# Patient Record
Sex: Female | Born: 1960 | Race: Black or African American | Hispanic: No | Smoking: Never smoker
Health system: Southern US, Community
[De-identification: ages and names within clinical notes are randomized; demographics above are authoritative.]

## PROBLEM LIST (undated history)

## (undated) DIAGNOSIS — I890 Lymphedema, not elsewhere classified: Secondary | ICD-10-CM

## (undated) DIAGNOSIS — I2510 Atherosclerotic heart disease of native coronary artery without angina pectoris: Secondary | ICD-10-CM

## (undated) DIAGNOSIS — I5189 Other ill-defined heart diseases: Secondary | ICD-10-CM

## (undated) DIAGNOSIS — J45909 Unspecified asthma, uncomplicated: Secondary | ICD-10-CM

## (undated) DIAGNOSIS — Z973 Presence of spectacles and contact lenses: Secondary | ICD-10-CM

## (undated) DIAGNOSIS — I319 Disease of pericardium, unspecified: Secondary | ICD-10-CM

## (undated) DIAGNOSIS — T4145XA Adverse effect of unspecified anesthetic, initial encounter: Secondary | ICD-10-CM

## (undated) DIAGNOSIS — R0600 Dyspnea, unspecified: Secondary | ICD-10-CM

## (undated) DIAGNOSIS — E119 Type 2 diabetes mellitus without complications: Secondary | ICD-10-CM

## (undated) DIAGNOSIS — I10 Essential (primary) hypertension: Secondary | ICD-10-CM

## (undated) DIAGNOSIS — T8859XA Other complications of anesthesia, initial encounter: Secondary | ICD-10-CM

## (undated) DIAGNOSIS — T7840XA Allergy, unspecified, initial encounter: Secondary | ICD-10-CM

## (undated) DIAGNOSIS — D759 Disease of blood and blood-forming organs, unspecified: Secondary | ICD-10-CM

## (undated) HISTORY — DX: Other ill-defined heart diseases: I51.89

## (undated) HISTORY — DX: Atherosclerotic heart disease of native coronary artery without angina pectoris: I25.10

## (undated) HISTORY — PX: PERICARDIOCENTESIS: SHX2215

## (undated) HISTORY — PX: ABDOMINAL HYSTERECTOMY: SHX81

---
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
## 2017-09-24 LAB — HM PAP SMEAR: HM Pap smear: NEGATIVE

## 2018-05-15 ENCOUNTER — Emergency Department: Payer: BLUE CROSS/BLUE SHIELD

## 2018-05-15 ENCOUNTER — Inpatient Hospital Stay
Admission: EM | Admit: 2018-05-15 | Discharge: 2018-05-20 | DRG: 247 | Disposition: A | Discharge status: Home / Self Care | Payer: BLUE CROSS/BLUE SHIELD | Attending: Specialist | Admitting: Specialist

## 2018-05-15 DIAGNOSIS — R51 Headache: Secondary | ICD-10-CM | POA: Diagnosis not present

## 2018-05-15 DIAGNOSIS — Z8249 Family history of ischemic heart disease and other diseases of the circulatory system: Secondary | ICD-10-CM

## 2018-05-15 DIAGNOSIS — Z6834 Body mass index (BMI) 34.0-34.9, adult: Secondary | ICD-10-CM

## 2018-05-15 DIAGNOSIS — R451 Restlessness and agitation: Secondary | ICD-10-CM | POA: Diagnosis not present

## 2018-05-15 DIAGNOSIS — Z881 Allergy status to other antibiotic agents status: Secondary | ICD-10-CM

## 2018-05-15 DIAGNOSIS — R29704 NIHSS score 4: Secondary | ICD-10-CM | POA: Diagnosis not present

## 2018-05-15 DIAGNOSIS — R4182 Altered mental status, unspecified: Secondary | ICD-10-CM | POA: Diagnosis not present

## 2018-05-15 DIAGNOSIS — I214 Non-ST elevation (NSTEMI) myocardial infarction: Secondary | ICD-10-CM | POA: Diagnosis not present

## 2018-05-15 DIAGNOSIS — E785 Hyperlipidemia, unspecified: Secondary | ICD-10-CM | POA: Diagnosis present

## 2018-05-15 DIAGNOSIS — Z22322 Carrier or suspected carrier of Methicillin resistant Staphylococcus aureus: Secondary | ICD-10-CM

## 2018-05-15 DIAGNOSIS — I10 Essential (primary) hypertension: Secondary | ICD-10-CM | POA: Diagnosis present

## 2018-05-15 DIAGNOSIS — I493 Ventricular premature depolarization: Secondary | ICD-10-CM | POA: Diagnosis not present

## 2018-05-15 DIAGNOSIS — I2510 Atherosclerotic heart disease of native coronary artery without angina pectoris: Secondary | ICD-10-CM | POA: Diagnosis present

## 2018-05-15 DIAGNOSIS — Z79899 Other long term (current) drug therapy: Secondary | ICD-10-CM

## 2018-05-15 DIAGNOSIS — R4781 Slurred speech: Secondary | ICD-10-CM | POA: Diagnosis not present

## 2018-05-15 DIAGNOSIS — I639 Cerebral infarction, unspecified: Secondary | ICD-10-CM

## 2018-05-15 DIAGNOSIS — I890 Lymphedema, not elsewhere classified: Secondary | ICD-10-CM | POA: Diagnosis present

## 2018-05-15 DIAGNOSIS — R112 Nausea with vomiting, unspecified: Secondary | ICD-10-CM | POA: Diagnosis not present

## 2018-05-15 DIAGNOSIS — E669 Obesity, unspecified: Secondary | ICD-10-CM | POA: Diagnosis present

## 2018-05-15 DIAGNOSIS — Z713 Dietary counseling and surveillance: Secondary | ICD-10-CM

## 2018-05-15 HISTORY — DX: Disease of pericardium, unspecified: I31.9

## 2018-05-15 HISTORY — DX: Lymphedema, not elsewhere classified: I89.0

## 2018-05-15 HISTORY — DX: Unspecified asthma, uncomplicated: J45.909

## 2018-05-15 HISTORY — DX: Essential (primary) hypertension: I10

## 2018-05-15 LAB — BASIC METABOLIC PANEL
ANION GAP: 8 (ref 5–15)
BUN: 12 mg/dL (ref 6–20)
CHLORIDE: 103 mmol/L (ref 98–111)
CO2: 27 mmol/L (ref 22–32)
CREATININE: 0.89 mg/dL (ref 0.44–1.00)
Calcium: 9.1 mg/dL (ref 8.9–10.3)
GFR calc non Af Amer: >60 mL/min (ref >60)
GLUCOSE: 87 mg/dL (ref 70–99)
POTASSIUM: 4 mmol/L (ref 3.5–5.1)
Sodium: 138 mmol/L (ref 135–145)

## 2018-05-15 LAB — CBC
HCT: 41.1 % (ref 35.0–47.0)
Hemoglobin: 13.2 g/dL (ref 12.0–16.0)
MCH: 26.6 pg (ref 26.0–34.0)
MCHC: 32.2 g/dL (ref 32.0–36.0)
MCV: 82.6 fL (ref 80.0–100.0)
Platelets: 238 10*3/uL (ref 150–440)
RBC: 4.98 MIL/uL (ref 3.80–5.20)
RDW: 15 % — ABNORMAL HIGH (ref 11.5–14.5)
WBC: 6.1 10*3/uL (ref 3.6–11.0)

## 2018-05-15 LAB — TROPONIN I: Troponin I: 0.04 ng/mL (ref <0.03)

## 2018-05-15 MED ORDER — NITROGLYCERIN 0.4 MG SL SUBL
0.4000 mg | SUBLINGUAL_TABLET | SUBLINGUAL | Status: DC | PRN
  Administered 2018-05-15: 0.4 mg via SUBLINGUAL

## 2018-05-15 MED ORDER — NITROGLYCERIN 0.4 MG SL SUBL
SUBLINGUAL_TABLET | SUBLINGUAL | Status: AC
  Administered 2018-05-15: 0.4 mg via SUBLINGUAL
  Filled 2018-05-15: qty 1

## 2018-05-15 MED ORDER — SODIUM CHLORIDE 0.9 % IV BOLUS
1000.0000 mL | Freq: Once | INTRAVENOUS | Status: AC
  Administered 2018-05-15: 1000 mL via INTRAVENOUS

## 2018-05-15 MED ORDER — HEPARIN (PORCINE) IN NACL 100-0.45 UNIT/ML-% IJ SOLN
850.0000 [IU]/h | INTRAMUSCULAR | Status: DC
  Administered 2018-05-16 (×2): 850 [IU]/h via INTRAVENOUS
  Filled 2018-05-15: qty 250

## 2018-05-15 MED ORDER — ASPIRIN 81 MG PO CHEW
CHEWABLE_TABLET | ORAL | Status: AC
Start: 2018-05-15 — End: 2018-05-16
  Filled 2018-05-15: qty 4

## 2018-05-15 MED ORDER — HEPARIN SODIUM (PORCINE) 5000 UNIT/ML IJ SOLN
4000.0000 [IU] | Freq: Once | INTRAMUSCULAR | Status: AC
  Administered 2018-05-15: 4000 [IU] via INTRAVENOUS

## 2018-05-15 NOTE — ED Notes (Signed)
Pt placed on zoll monitor

## 2018-05-15 NOTE — ED Notes (Signed)
Pt reports hx of pericarditis, states this CP feels similar. Pt reports CP on and off since Tuesday. Pt states improvement in CP when sitting up straight in bed. Pt states some SHOB with the pain as well.

## 2018-05-15 NOTE — ED Provider Notes (Signed)
[facility] Emergency Department Provider Note  ____________________________________________  Time seen: Approximately 10:39 PM  I have reviewed the triage vital signs and the nursing notes.   HISTORY  Chief Complaint Chest Pain   HPI Kelsey Terry is a 56 y.o. female with a history of hypertension, left lower extremity lymphedema, asthma, and pericarditis who presents for evaluation of chest pain.  Patient reports several episodes of chest pain since yesterday.  No chest pain prior to yesterday.  These episodes happen with minimal exertion and resolve at rest.  She reports the pain as severe pressure in the center of her chest associated with mild shortness of breath.  No dizziness, no nausea, no diaphoresis.  No prior history of smoking.  No personal or family history of heart attacks.  When EMS arrived patient's pain was 7 out of 10.  She received a full dose of aspirin.  At this time she reports that her pain is improved and currently 2 out of 10.  No personal or family history of blood clots, recent travel immobilization, leg pain or swelling, hemoptysis, exogenous hormones, or history of cancer.   Past Medical History:  Diagnosis Date  . Asthma   . Hypertension   . Lymphedema    LLE  . Pericarditis     There are no active problems to display for this patient.   Past Surgical History:  Procedure Laterality Date  . CESAREAN SECTION    . PERICARDIOCENTESIS      Prior to Admission medications   Medication Sig Start Date End Date Taking? Authorizing Provider  losartan-hydrochlorothiazide (HYZAAR) 100-25 MG tablet Take 1 tablet by mouth daily.   Yes [provider]  potassium chloride (K-DUR) 10 MEQ tablet Take 20 mEq by mouth daily.   Yes [provider]    Allergies Amoxicillin  No family history on file.  Social History Social History   Tobacco Use  . Smoking status: Never Smoker  . Smokeless tobacco: Never Used    Substance Use Topics  . Alcohol use: Not Currently  . Drug use: Not on file    Review of Systems  Constitutional: Negative for fever. Eyes: Negative for visual changes. ENT: Negative for sore throat. Neck: No neck pain  Cardiovascular: + chest pain. Respiratory: + shortness of breath. Gastrointestinal: Negative for abdominal pain, vomiting or diarrhea. Genitourinary: Negative for dysuria. Musculoskeletal: Negative for back pain. Skin: Negative for rash. Neurological: Negative for headaches, weakness or numbness. Psych: No SI or HI  ____________________________________________   PHYSICAL EXAM:  VITAL SIGNS: ED Triage Vitals  Enc Vitals Group     BP 05/15/18 2209 (!) 169/96     Pulse Rate 05/15/18 2209 89     Resp 05/15/18 2209 17     Temp 05/15/18 2209 98.1 F (36.7 C)     Temp Source 05/15/18 2209 Oral     SpO2 05/15/18 2209 99 %     Weight --      Height --      Head Circumference --      Peak Flow --      Pain Score 05/15/18 2224 1     Pain Loc --      Pain Edu? --      Excl. in [place]? --     Constitutional: Alert and oriented. Well appearing and in no apparent distress. HEENT:      Head: Normocephalic and atraumatic.         Eyes: Conjunctivae are normal.  Sclera is non-icteric.       Mouth/Throat: Mucous membranes are moist.       Neck: Supple with no signs of meningismus. Cardiovascular: Regular rate and rhythm. No murmurs, gallops, or rubs. 2+ symmetrical distal pulses are present in all extremities. No JVD. Respiratory: Normal respiratory effort. Lungs are clear to auscultation bilaterally. No wheezes, crackles, or rhonchi.  Gastrointestinal: Soft, non tender, and non distended with positive bowel sounds. No rebound or guarding. Musculoskeletal: Nontender with normal range of motion in all extremities. No edema, cyanosis, or erythema of extremities. Neurologic: Normal speech and language. Face is symmetric. Moving all extremities. No gross focal neurologic  deficits are appreciated. Skin: Skin is warm, dry and intact. No rash noted. Psychiatric: Mood and affect are normal. Speech and behavior are normal.  ____________________________________________   LABS (all labs ordered are listed, but only abnormal results are displayed)  Labs Reviewed  CBC - Abnormal; Notable for the following components:      Result Value   RDW 15.0 (*)    All other components within normal limits  TROPONIN I - Abnormal; Notable for the following components:   Troponin I 0.04 (*)    All other components within normal limits  BASIC METABOLIC PANEL  PROTIME-INR  APTT  LIPID PANEL  TROPONIN I   ____________________________________________  EKG  ED ECG REPORT I, Rudene Re, the attending physician, personally viewed and interpreted this ECG.  Normal sinus rhythm, rate of 72, normal intervals, normal axis, Wellens T wave in V2 with T wave inversion in 1, aVL, and V3.  No prior for comparison  22:21 -normal sinus rhythm, rate of 83, normal intervals, normal axis, ST depressions in inferior and lateral leads with no ST elevation.  Wellens T waves is no longer seen. ____________________________________________  RADIOLOGY  I have personally reviewed the images performed during this visit and I agree with the Radiologist's read.   Interpretation by Radiologist:  Dg Chest 2 View  Result Date: 05/15/2018 CLINICAL DATA:  Chest pain EXAM: CHEST - 2 VIEW COMPARISON:  None. FINDINGS: Diffuse interstitial coarsening. No focal airspace consolidation. No pleural effusion or pneumothorax. Normal cardiomediastinal contours. IMPRESSION: Possible chronic bronchitis.  No pneumonia or pulmonary edema. Electronically Signed   By: Ulyses Jarred M.D.   On: 05/15/2018 22:21    ____________________________________________   PROCEDURES  Procedure(s) performed: None Procedures Critical Care performed: yes  CRITICAL CARE Performed by: Rudene Re  ?  Total  critical care time: 35 min  Critical care time was exclusive of separately billable procedures and treating other patients.  Critical care was necessary to treat or prevent imminent or life-threatening deterioration.  Critical care was time spent personally by me on the following activities: development of treatment plan with patient and/or surrogate as well as nursing, discussions with consultants, evaluation of patient's response to treatment, examination of patient, obtaining history from patient or surrogate, ordering and performing treatments and interventions, ordering and review of laboratory studies, ordering and review of radiographic studies, pulse oximetry and re-evaluation of patient's condition.   ____________________________________________   INITIAL IMPRESSION / ASSESSMENT AND PLAN / ED COURSE  57 y.o. female with a history of hypertension, left lower extremity lymphedema, asthma, and pericarditis who presents for evaluation of chest pain.  Presentation concerning for ACS.  Initial EKG showing Wellens T wave in V2.  Patient's pain then improved and a repeat EKG was done which showed diffuse ST depressions in inferior and lateral leads but no ST elevation.  After  1 sublingual nitro pain went away. First troponin 0.04. I spoke with Dr. Fletcher Anon who agrees that EKG is concerning for ischemia but at this time he does not believe EKG meet STEMI criteria.  He recommended admission on heparin drip and catheterization in the morning.  He also recommended emergent cath overnight if patient's pain is uncontrolled or if she has any acute EKG changes with her pain.  That recommendation was discussed with Dr. Marcille Blanco who will be admitting patient for further evaluation.      As part of my medical decision making, I reviewed the following data within the [place] notes reviewed and incorporated, Labs reviewed , EKG interpreted , Radiograph reviewed , Discussed with  admitting physician , A consult was requested and obtained from this/these consultant(s) Cardiology, Notes from prior ED visits and [place] Controlled Substance Database    Pertinent labs & imaging results that were available during my care of the patient were reviewed by me and considered in my medical decision making (see chart for details).    ____________________________________________   FINAL CLINICAL IMPRESSION(S) / ED DIAGNOSES  Final diagnoses:  NSTEMI (non-ST elevated myocardial infarction) ([place])      NEW MEDICATIONS STARTED DURING THIS VISIT:  ED Discharge Orders    None       Note:  This document was prepared using Dragon voice recognition software and may include unintentional dictation errors.    Alfred Levins, [place], MD 05/15/18 [phone]

## 2018-05-15 NOTE — ED Triage Notes (Signed)
Pt arrived via EMS from home with c/o intermittent central chest pain that started Tuesday and tonight the pressure intensified. Aspirin given in route by EMS. Pt denies N/V/SOB.

## 2018-05-15 NOTE — ED Notes (Signed)
MD Edson Snowball RN aware of pt Troponin of 0.04

## 2018-05-15 NOTE — Progress Notes (Signed)
ANTICOAGULATION CONSULT NOTE - Initial Consult  Pharmacy Consult for Heparin  Indication: chest pain/ACS  Allergies  Allergen Reactions  . Amoxicillin Itching    Patient Measurements: Height: 5\' 3"  (160 cm) Weight: 180 lb (81.6 kg) IBW/kg (Calculated) : 52.4 Heparin Dosing Weight: 70.3 kg   Vital Signs: Temp: 98.1 F (36.7 C) (06/27 2209) Temp Source: Oral (06/27 2209) BP: 156/80 (06/27 2230) Pulse Rate: 76 (06/27 2241)  Labs: Recent Labs    05/15/18 2202  HGB 13.2  HCT 41.1  PLT 238  CREATININE 0.89  TROPONINI 0.04*    Estimated Creatinine Clearance: 69.7 mL/min (by C-G formula based on SCr of 0.89 mg/dL).   Medical History: Past Medical History:  Diagnosis Date  . Asthma   . Hypertension   . Lymphedema    LLE  . Pericarditis     Medications:   (Not in a hospital admission)  Assessment: Pharmacy consulted to dose heparin in this 57 year old female with ACS/NSTEMI .   No prior anticoag noted CrCl = 69.7 ml/min  Goal of Therapy:  Heparin level 0.3-0.7 units/ml Monitor platelets by anticoagulation protocol: Yes   Plan:  Give 4000 units bolus x 1 Start heparin infusion at 850 units/hr Check anti-Xa level in 6 hours and daily while on heparin Continue to monitor H&H and platelets  Carmyn Hamm D 05/15/2018,10:52 PM

## 2018-05-16 ENCOUNTER — Encounter: Payer: Self-pay | Admitting: *Deleted

## 2018-05-16 ENCOUNTER — Inpatient Hospital Stay (HOSPITAL_COMMUNITY)
Admit: 2018-05-16 | Discharge: 2018-05-16 | Disposition: A | Discharge status: Home / Self Care | Payer: BLUE CROSS/BLUE SHIELD | Attending: Physician Assistant | Admitting: Physician Assistant

## 2018-05-16 ENCOUNTER — Encounter
Admission: EM | Disposition: A | Discharge status: Home / Self Care | Payer: Self-pay | Source: Home / Self Care | Attending: Specialist

## 2018-05-16 ENCOUNTER — Inpatient Hospital Stay: Payer: BLUE CROSS/BLUE SHIELD

## 2018-05-16 ENCOUNTER — Other Ambulatory Visit: Payer: Self-pay

## 2018-05-16 DIAGNOSIS — R079 Chest pain, unspecified: Secondary | ICD-10-CM

## 2018-05-16 DIAGNOSIS — R29704 NIHSS score 4: Secondary | ICD-10-CM | POA: Diagnosis not present

## 2018-05-16 DIAGNOSIS — I10 Essential (primary) hypertension: Secondary | ICD-10-CM | POA: Diagnosis present

## 2018-05-16 DIAGNOSIS — R112 Nausea with vomiting, unspecified: Secondary | ICD-10-CM | POA: Diagnosis not present

## 2018-05-16 DIAGNOSIS — R4182 Altered mental status, unspecified: Secondary | ICD-10-CM | POA: Diagnosis not present

## 2018-05-16 DIAGNOSIS — I2510 Atherosclerotic heart disease of native coronary artery without angina pectoris: Secondary | ICD-10-CM | POA: Diagnosis present

## 2018-05-16 DIAGNOSIS — E669 Obesity, unspecified: Secondary | ICD-10-CM | POA: Diagnosis present

## 2018-05-16 DIAGNOSIS — I214 Non-ST elevation (NSTEMI) myocardial infarction: Secondary | ICD-10-CM

## 2018-05-16 DIAGNOSIS — Z881 Allergy status to other antibiotic agents status: Secondary | ICD-10-CM | POA: Diagnosis not present

## 2018-05-16 DIAGNOSIS — I639 Cerebral infarction, unspecified: Secondary | ICD-10-CM

## 2018-05-16 DIAGNOSIS — Z22322 Carrier or suspected carrier of Methicillin resistant Staphylococcus aureus: Secondary | ICD-10-CM | POA: Diagnosis not present

## 2018-05-16 DIAGNOSIS — I890 Lymphedema, not elsewhere classified: Secondary | ICD-10-CM | POA: Diagnosis present

## 2018-05-16 DIAGNOSIS — R51 Headache: Secondary | ICD-10-CM | POA: Diagnosis not present

## 2018-05-16 DIAGNOSIS — R451 Restlessness and agitation: Secondary | ICD-10-CM | POA: Diagnosis not present

## 2018-05-16 DIAGNOSIS — R4781 Slurred speech: Secondary | ICD-10-CM | POA: Diagnosis not present

## 2018-05-16 DIAGNOSIS — Z79899 Other long term (current) drug therapy: Secondary | ICD-10-CM | POA: Diagnosis not present

## 2018-05-16 DIAGNOSIS — E785 Hyperlipidemia, unspecified: Secondary | ICD-10-CM | POA: Diagnosis present

## 2018-05-16 DIAGNOSIS — Z8249 Family history of ischemic heart disease and other diseases of the circulatory system: Secondary | ICD-10-CM | POA: Diagnosis not present

## 2018-05-16 DIAGNOSIS — Z713 Dietary counseling and surveillance: Secondary | ICD-10-CM | POA: Diagnosis not present

## 2018-05-16 DIAGNOSIS — I493 Ventricular premature depolarization: Secondary | ICD-10-CM | POA: Diagnosis not present

## 2018-05-16 HISTORY — DX: Non-ST elevation (NSTEMI) myocardial infarction: I21.4

## 2018-05-16 HISTORY — PX: CORONARY STENT INTERVENTION: CATH118234

## 2018-05-16 HISTORY — PX: LEFT HEART CATH AND CORONARY ANGIOGRAPHY: CATH118249

## 2018-05-16 LAB — CBC
HEMATOCRIT: 42.6 % (ref 35.0–47.0)
Hemoglobin: 14 g/dL (ref 12.0–16.0)
MCH: 27 pg (ref 26.0–34.0)
MCHC: 33 g/dL (ref 32.0–36.0)
MCV: 82 fL (ref 80.0–100.0)
Platelets: 248 10*3/uL (ref 150–440)
RBC: 5.19 MIL/uL (ref 3.80–5.20)
RDW: 14.7 % — ABNORMAL HIGH (ref 11.5–14.5)
WBC: 6.4 10*3/uL (ref 3.6–11.0)

## 2018-05-16 LAB — HEMOGLOBIN A1C
Hgb A1c MFr Bld: 5.7 % — ABNORMAL HIGH (ref 4.8–5.6)
Mean Plasma Glucose: 116.89 mg/dL

## 2018-05-16 LAB — PROTIME-INR
INR: 0.94
Prothrombin Time: 12.5 seconds (ref 11.4–15.2)

## 2018-05-16 LAB — LIPID PANEL
CHOL/HDL RATIO: 4.2 ratio
Cholesterol: 232 mg/dL — ABNORMAL HIGH (ref 0–200)
HDL: 55 mg/dL (ref >40)
LDL Cholesterol: 168 mg/dL — ABNORMAL HIGH (ref 0–99)
Triglycerides: 43 mg/dL (ref <150)
VLDL: 9 mg/dL (ref 0–40)

## 2018-05-16 LAB — POCT ACTIVATED CLOTTING TIME: Activated Clotting Time: 279 seconds

## 2018-05-16 LAB — TROPONIN I
TROPONIN I: 0.08 ng/mL — AB (ref <0.03)
Troponin I: 0.13 ng/mL (ref <0.03)
Troponin I: 0.18 ng/mL (ref <0.03)
Troponin I: 0.18 ng/mL (ref <0.03)
Troponin I: 0.3 ng/mL (ref <0.03)

## 2018-05-16 LAB — ECHOCARDIOGRAM COMPLETE
HEIGHTINCHES: 63 in
Weight: 3187.2 oz

## 2018-05-16 LAB — APTT: aPTT: 76 seconds — ABNORMAL HIGH (ref 24–36)

## 2018-05-16 LAB — GLUCOSE, CAPILLARY
Glucose-Capillary: 79 mg/dL (ref 70–99)
Glucose-Capillary: 112 mg/dL — ABNORMAL HIGH (ref 70–99)

## 2018-05-16 LAB — MRSA PCR SCREENING: MRSA by PCR: POSITIVE — AB

## 2018-05-16 LAB — TSH: TSH: 1.115 u[IU]/mL (ref 0.350–4.500)

## 2018-05-16 LAB — HEPARIN LEVEL (UNFRACTIONATED): Heparin Unfractionated: 0.4 IU/mL (ref 0.30–0.70)

## 2018-05-16 LAB — MAGNESIUM: MAGNESIUM: 2.6 mg/dL — AB (ref 1.7–2.4)

## 2018-05-16 SURGERY — LEFT HEART CATH AND CORONARY ANGIOGRAPHY
Anesthesia: Moderate Sedation

## 2018-05-16 MED ORDER — IOHEXOL 350 MG/ML SOLN
75.0000 mL | Freq: Once | INTRAVENOUS | Status: AC | PRN
  Administered 2018-05-16: 75 mL via INTRAVENOUS

## 2018-05-16 MED ORDER — ONDANSETRON HCL 4 MG/2ML IJ SOLN
INTRAMUSCULAR | Status: AC
  Filled 2018-05-16: qty 2

## 2018-05-16 MED ORDER — LOSARTAN POTASSIUM 50 MG PO TABS
100.0000 mg | ORAL_TABLET | Freq: Every day | ORAL | Status: DC
  Administered 2018-05-17 – 2018-05-20 (×4): 100 mg via ORAL
  Filled 2018-05-16 (×4): qty 2

## 2018-05-16 MED ORDER — SODIUM CHLORIDE 0.9% FLUSH
3.0000 mL | Freq: Two times a day (BID) | INTRAVENOUS | Status: DC
  Administered 2018-05-16 – 2018-05-19 (×6): 3 mL via INTRAVENOUS

## 2018-05-16 MED ORDER — CARVEDILOL 6.25 MG PO TABS
6.2500 mg | ORAL_TABLET | Freq: Two times a day (BID) | ORAL | Status: DC
  Administered 2018-05-17 – 2018-05-19 (×3): 6.25 mg via ORAL
  Filled 2018-05-16 (×6): qty 1

## 2018-05-16 MED ORDER — HEPARIN SODIUM (PORCINE) 1000 UNIT/ML IJ SOLN
INTRAMUSCULAR | Status: DC | PRN
  Administered 2018-05-16 (×2): 4500 [IU] via INTRAVENOUS

## 2018-05-16 MED ORDER — NITROGLYCERIN 1 MG/10 ML FOR IR/CATH LAB
INTRA_ARTERIAL | Status: DC | PRN
  Administered 2018-05-16: 300 ug via INTRACORONARY
  Administered 2018-05-16: 200 ug via INTRACORONARY

## 2018-05-16 MED ORDER — SODIUM CHLORIDE 0.9 % WEIGHT BASED INFUSION: 1.0000 mL/kg/h | INTRAVENOUS | Status: DC

## 2018-05-16 MED ORDER — MIDAZOLAM HCL 2 MG/2ML IJ SOLN
INTRAMUSCULAR | Status: DC | PRN
  Administered 2018-05-16 (×2): 1 mg via INTRAVENOUS

## 2018-05-16 MED ORDER — SODIUM CHLORIDE 0.9% FLUSH: 3.0000 mL | INTRAVENOUS | Status: DC | PRN

## 2018-05-16 MED ORDER — POTASSIUM CHLORIDE CRYS ER 20 MEQ PO TBCR
20.0000 meq | EXTENDED_RELEASE_TABLET | Freq: Every day | ORAL | Status: DC
  Administered 2018-05-17 – 2018-05-20 (×4): 20 meq via ORAL
  Filled 2018-05-16 (×4): qty 1

## 2018-05-16 MED ORDER — DEXMEDETOMIDINE HCL IN NACL 400 MCG/100ML IV SOLN
INTRAVENOUS | Status: AC
  Administered 2018-05-16: 0.5 ug/kg/h via INTRAVENOUS
  Filled 2018-05-16: qty 100

## 2018-05-16 MED ORDER — LIDOCAINE HCL (PF) 1 % IJ SOLN
INTRAMUSCULAR | Status: AC
  Filled 2018-05-16: qty 30

## 2018-05-16 MED ORDER — HYDRALAZINE HCL 20 MG/ML IJ SOLN: 5.0000 mg | INTRAMUSCULAR | Status: DC | PRN

## 2018-05-16 MED ORDER — DOCUSATE SODIUM 100 MG PO CAPS
100.0000 mg | ORAL_CAPSULE | Freq: Two times a day (BID) | ORAL | Status: DC
  Administered 2018-05-17 – 2018-05-19 (×3): 100 mg via ORAL
  Filled 2018-05-16 (×6): qty 1

## 2018-05-16 MED ORDER — PROMETHAZINE HCL 25 MG/ML IJ SOLN
12.5000 mg | Freq: Once | INTRAMUSCULAR | Status: AC
  Administered 2018-05-16: 12.5 mg via INTRAVENOUS

## 2018-05-16 MED ORDER — SODIUM CHLORIDE 0.9 % WEIGHT BASED INFUSION
1.0000 mL/kg/h | INTRAVENOUS | Status: DC
  Administered 2018-05-16: 1 mL/kg/h via INTRAVENOUS

## 2018-05-16 MED ORDER — SODIUM CHLORIDE 0.9 % IV SOLN: 250.0000 mL | INTRAVENOUS | Status: DC | PRN

## 2018-05-16 MED ORDER — NITROGLYCERIN 5 MG/ML IV SOLN
INTRAVENOUS | Status: AC
  Filled 2018-05-16: qty 10

## 2018-05-16 MED ORDER — DEXMEDETOMIDINE HCL IN NACL 400 MCG/100ML IV SOLN
0.4000 ug/kg/h | INTRAVENOUS | Status: DC
  Administered 2018-05-16: 0.5 ug/kg/h via INTRAVENOUS
  Filled 2018-05-16: qty 100

## 2018-05-16 MED ORDER — TICAGRELOR 90 MG PO TABS
ORAL_TABLET | ORAL | Status: DC | PRN
  Administered 2018-05-16: 180 mg via ORAL

## 2018-05-16 MED ORDER — LOSARTAN POTASSIUM-HCTZ 100-25 MG PO TABS: 1.0000 | ORAL_TABLET | Freq: Every day | ORAL | Status: DC

## 2018-05-16 MED ORDER — ASPIRIN EC 81 MG PO TBEC
81.0000 mg | DELAYED_RELEASE_TABLET | Freq: Every day | ORAL | Status: DC
  Administered 2018-05-17 – 2018-05-20 (×4): 81 mg via ORAL
  Filled 2018-05-16 (×4): qty 1

## 2018-05-16 MED ORDER — ACETAMINOPHEN 325 MG PO TABS
650.0000 mg | ORAL_TABLET | Freq: Four times a day (QID) | ORAL | Status: DC | PRN
  Administered 2018-05-17 – 2018-05-19 (×4): 650 mg via ORAL
  Filled 2018-05-16 (×4): qty 2

## 2018-05-16 MED ORDER — PROMETHAZINE HCL 25 MG/ML IJ SOLN
INTRAMUSCULAR | Status: AC
  Filled 2018-05-16: qty 1

## 2018-05-16 MED ORDER — IOPAMIDOL (ISOVUE-300) INJECTION 61%
INTRAVENOUS | Status: DC | PRN
  Administered 2018-05-16: 125 mL via INTRA_ARTERIAL

## 2018-05-16 MED ORDER — SODIUM CHLORIDE 0.9 % WEIGHT BASED INFUSION: 3.0000 mL/kg/h | INTRAVENOUS | Status: DC

## 2018-05-16 MED ORDER — HYDROCHLOROTHIAZIDE 25 MG PO TABS
25.0000 mg | ORAL_TABLET | Freq: Every day | ORAL | Status: DC
  Administered 2018-05-17 – 2018-05-20 (×4): 25 mg via ORAL
  Filled 2018-05-16 (×4): qty 1

## 2018-05-16 MED ORDER — VERAPAMIL HCL 2.5 MG/ML IV SOLN
INTRAVENOUS | Status: AC
  Filled 2018-05-16: qty 2

## 2018-05-16 MED ORDER — FENTANYL CITRATE (PF) 100 MCG/2ML IJ SOLN
INTRAMUSCULAR | Status: DC | PRN
  Administered 2018-05-16 (×2): 50 ug via INTRAVENOUS

## 2018-05-16 MED ORDER — TICAGRELOR 90 MG PO TABS
90.0000 mg | ORAL_TABLET | Freq: Two times a day (BID) | ORAL | Status: DC
  Administered 2018-05-16 – 2018-05-20 (×8): 90 mg via ORAL
  Filled 2018-05-16 (×8): qty 1

## 2018-05-16 MED ORDER — FENTANYL CITRATE (PF) 100 MCG/2ML IJ SOLN
INTRAMUSCULAR | Status: AC
  Filled 2018-05-16: qty 2

## 2018-05-16 MED ORDER — ACETAMINOPHEN 650 MG RE SUPP
650.0000 mg | Freq: Four times a day (QID) | RECTAL | Status: DC | PRN
Start: 2018-05-16 — End: 2018-05-20

## 2018-05-16 MED ORDER — HEPARIN (PORCINE) IN NACL 1000-0.9 UT/500ML-% IV SOLN
INTRAVENOUS | Status: AC
  Filled 2018-05-16: qty 1000

## 2018-05-16 MED ORDER — KETOROLAC TROMETHAMINE 15 MG/ML IJ SOLN
INTRAMUSCULAR | Status: AC
  Administered 2018-05-16: 15 mg via INTRAVENOUS
  Filled 2018-05-16: qty 1

## 2018-05-16 MED ORDER — CARVEDILOL 3.125 MG PO TABS: 3.1250 mg | ORAL_TABLET | Freq: Two times a day (BID) | ORAL | Status: DC

## 2018-05-16 MED ORDER — KETOROLAC TROMETHAMINE 15 MG/ML IJ SOLN
15.0000 mg | Freq: Once | INTRAMUSCULAR | Status: AC
  Administered 2018-05-16: 15 mg via INTRAVENOUS

## 2018-05-16 MED ORDER — FENTANYL CITRATE (PF) 100 MCG/2ML IJ SOLN
INTRAMUSCULAR | Status: AC
  Administered 2018-05-16: 25 ug via INTRAVENOUS
  Filled 2018-05-16: qty 2

## 2018-05-16 MED ORDER — ONDANSETRON HCL 4 MG/2ML IJ SOLN
4.0000 mg | Freq: Four times a day (QID) | INTRAMUSCULAR | Status: DC | PRN
  Administered 2018-05-16 – 2018-05-18 (×4): 4 mg via INTRAVENOUS
  Filled 2018-05-16 (×3): qty 2

## 2018-05-16 MED ORDER — HEPARIN (PORCINE) IN NACL 1000-0.9 UT/500ML-% IV SOLN
INTRAVENOUS | Status: AC
  Filled 2018-05-16: qty 500

## 2018-05-16 MED ORDER — ENOXAPARIN SODIUM 40 MG/0.4ML ~~LOC~~ SOLN
40.0000 mg | SUBCUTANEOUS | Status: DC
  Administered 2018-05-17: 40 mg via SUBCUTANEOUS
  Filled 2018-05-16: qty 0.4

## 2018-05-16 MED ORDER — FENTANYL CITRATE (PF) 100 MCG/2ML IJ SOLN
25.0000 ug | Freq: Once | INTRAMUSCULAR | Status: AC
  Administered 2018-05-16: 25 ug via INTRAVENOUS

## 2018-05-16 MED ORDER — VERAPAMIL HCL 2.5 MG/ML IV SOLN
INTRAVENOUS | Status: DC | PRN
  Administered 2018-05-16 (×2): 2.5 mg via INTRA_ARTERIAL

## 2018-05-16 MED ORDER — ASPIRIN 81 MG PO CHEW
CHEWABLE_TABLET | ORAL | Status: AC
  Filled 2018-05-16: qty 4

## 2018-05-16 MED ORDER — TICAGRELOR 90 MG PO TABS
90.0000 mg | ORAL_TABLET | Freq: Two times a day (BID) | ORAL | Status: DC
  Filled 2018-05-16 (×2): qty 1

## 2018-05-16 MED ORDER — LABETALOL HCL 5 MG/ML IV SOLN
10.0000 mg | INTRAVENOUS | Status: AC | PRN
  Administered 2018-05-16 (×3): 10 mg via INTRAVENOUS
  Filled 2018-05-16 (×3): qty 4

## 2018-05-16 MED ORDER — ATORVASTATIN CALCIUM 20 MG PO TABS
80.0000 mg | ORAL_TABLET | Freq: Every day | ORAL | Status: DC
  Administered 2018-05-17 – 2018-05-18 (×2): 80 mg via ORAL
  Filled 2018-05-16 (×3): qty 4

## 2018-05-16 MED ORDER — ASPIRIN 81 MG PO CHEW
CHEWABLE_TABLET | ORAL | Status: DC | PRN
  Administered 2018-05-16: 324 mg via ORAL

## 2018-05-16 MED ORDER — SODIUM CHLORIDE 0.9% FLUSH: 3.0000 mL | Freq: Two times a day (BID) | INTRAVENOUS | Status: DC

## 2018-05-16 MED ORDER — TICAGRELOR 90 MG PO TABS
ORAL_TABLET | ORAL | Status: AC
  Filled 2018-05-16: qty 2

## 2018-05-16 MED ORDER — MUPIROCIN 2 % EX OINT
1.0000 "application " | TOPICAL_OINTMENT | Freq: Two times a day (BID) | CUTANEOUS | Status: DC
  Administered 2018-05-16 – 2018-05-20 (×8): 1 via NASAL
  Filled 2018-05-16: qty 22

## 2018-05-16 MED ORDER — MIDAZOLAM HCL 2 MG/2ML IJ SOLN
INTRAMUSCULAR | Status: AC
  Filled 2018-05-16: qty 2

## 2018-05-16 MED ORDER — HYDRALAZINE HCL 20 MG/ML IJ SOLN: 10.0000 mg | INTRAMUSCULAR | Status: DC | PRN

## 2018-05-16 MED ORDER — ONDANSETRON HCL 4 MG PO TABS: 4.0000 mg | ORAL_TABLET | Freq: Four times a day (QID) | ORAL | Status: DC | PRN

## 2018-05-16 MED ORDER — HEPARIN SODIUM (PORCINE) 1000 UNIT/ML IJ SOLN
INTRAMUSCULAR | Status: AC
  Filled 2018-05-16: qty 1

## 2018-05-16 SURGICAL SUPPLY — 18 items
BALLN TREK RX 2.5X12 (BALLOONS) ×3
BALLN ~~LOC~~ TREK RX 3.5X12 (BALLOONS) ×3
BALLOON TREK RX 2.5X12 (BALLOONS) ×1 IMPLANT
BALLOON ~~LOC~~ TREK RX 3.5X12 (BALLOONS) ×1 IMPLANT
CATH INFINITI 5 FR JL3.5 (CATHETERS) ×3 IMPLANT
CATH INFINITI 5FR ANG PIGTAIL (CATHETERS) ×3 IMPLANT
CATH INFINITI 5FR JK (CATHETERS) ×3 IMPLANT
CATH LAUNCHER 6FR EBU3.5 (CATHETERS) ×3 IMPLANT
DEVICE INFLAT 30 PLUS (MISCELLANEOUS) ×3 IMPLANT
DEVICE RAD TR BAND REGULAR (VASCULAR PRODUCTS) ×3 IMPLANT
KIT MANI 3VAL PERCEP (MISCELLANEOUS) ×3 IMPLANT
NEEDLE PERC 21GX4CM (NEEDLE) ×3 IMPLANT
PACK CARDIAC CATH (CUSTOM PROCEDURE TRAY) ×3 IMPLANT
SHEATH RAIN RADIAL 21G 6FR (SHEATH) ×3 IMPLANT
STENT SIERRA 3.25 X 15 MM (Permanent Stent) ×3 IMPLANT
WIRE HITORQ VERSACORE ST 145CM (WIRE) ×3 IMPLANT
WIRE INTUITION PROPEL ST 180CM (WIRE) ×3 IMPLANT
WIRE ROSEN-J .035X260CM (WIRE) ×3 IMPLANT

## 2018-05-16 NOTE — Consult Note (Signed)
Referring Physician: Anselm Jungling    Chief Complaint: Confusion, slurred speech  HPI: Kelsey Terry is an 57 y.o. female who underwent cardiac cath today with findings of 2-vessel disease and placement of stent.  After procedure began to complain of headache in recovery.  Patient received Fentanyl for headache.  Then developed slurred speech and confusion. Then developed nausea and vomiting.  Code stroke was called.  Patient would not cooperate fully for NIHSS testing but initial NIHSS estimated at 4.  Date last known well: Date: 05/16/2018 Time last known well: Time: 12:00 tPA Given: No: Patient with recent administration of anticaogulants   Past Medical History:  Diagnosis Date  . Asthma   . Hypertension   . Lymphedema    LLE  . Pericarditis    a. ~ 2012 in [place], [place] s/p pericardiocentesis     Past Surgical History:  Procedure Laterality Date  . CESAREAN SECTION    . PERICARDIOCENTESIS      Family History  Problem Relation Age of Onset  . Hypertension Mother   . Pancreatic cancer Father    Social History:  reports that she has never smoked. She has never used smokeless tobacco. She reports that she drank alcohol. She reports that she does not use drugs.  Allergies:  Allergies  Allergen Reactions  . Amoxicillin Itching and Other (See Comments)    Reaction: bump in vaginal region Has patient had a PCN reaction causing immediate rash, facial/tongue/throat swelling, SOB or lightheadedness with hypotension: No Has patient had a PCN reaction causing severe rash involving mucus membranes or skin necrosis: No Has patient had a PCN reaction that required hospitalization: No Has patient had a PCN reaction occurring within the last 10 years: No If all of the above answers are "NO", then may proceed with Cephalosporin use.     Medications:  I have reviewed the patient's current medications. Prior to Admission:  Medications Prior to Admission  Medication Sig Dispense Refill Last  Dose  . losartan-hydrochlorothiazide (HYZAAR) 100-25 MG tablet Take 1 tablet by mouth daily.   05/15/2018 at Unknown time  . potassium chloride (K-DUR) 10 MEQ tablet Take 20 mEq by mouth daily.   05/15/2018 at Unknown time   Scheduled: . [MAR Hold] aspirin EC  81 mg Oral Daily  . [MAR Hold] atorvastatin  80 mg Oral q1800  . [MAR Hold] carvedilol  3.125 mg Oral BID WC  . [MAR Hold] docusate sodium  100 mg Oral BID  . [MAR Hold] losartan  100 mg Oral Daily   And  . [MAR Hold] hydrochlorothiazide  25 mg Oral Daily  . ondansetron      . [MAR Hold] potassium chloride SA  20 mEq Oral Daily  . sodium chloride flush  3 mL Intravenous Q12H    ROS: History obtained from the patient  General ROS: negative for - chills, fatigue, fever, night sweats, weight gain or weight loss Psychological ROS: negative for - behavioral disorder, hallucinations, memory difficulties, mood swings or suicidal ideation Ophthalmic ROS: negative for - blurry vision, double vision, eye pain or loss of vision ENT ROS: negative for - epistaxis, nasal discharge, oral lesions, sore throat, tinnitus or vertigo Allergy and Immunology ROS: negative for - hives or itchy/watery eyes Hematological and Lymphatic ROS: negative for - bleeding problems, bruising or swollen lymph nodes Endocrine ROS: negative for - galactorrhea, hair pattern changes, polydipsia/polyuria or temperature intolerance Respiratory ROS: negative for - cough, hemoptysis, shortness of breath or wheezing Cardiovascular ROS: negative for - chest  pain, dyspnea on exertion, edema or irregular heartbeat Gastrointestinal ROS: nausea/vomiting Genito-Urinary ROS: urinary urgency Musculoskeletal ROS: negative for - joint swelling or muscular weakness Neurological ROS: as noted in HPI Dermatological ROS: negative for rash and skin lesion changes  Physical Examination: Blood pressure (!) 172/95, pulse 70, temperature 98.4 F (36.9 C), temperature source Oral, resp.  rate 17, height 5\' 3"  (1.6 m), weight 90.4 kg (199 lb 3.2 oz), SpO2 99 %.  HEENT-  Normocephalic, no lesions, without obvious abnormality.  Normal external eye and conjunctiva.  Normal TM's bilaterally.  Normal auditory canals and external ears. Normal external nose, mucus membranes and septum.  Normal pharynx. Cardiovascular- S1, S2 normal, pulses palpable throughout   Lungs- chest clear, no wheezing, rales, normal symmetric air entry Abdomen- soft, non-tender; bowel sounds normal; no masses,  no organomegaly Extremities- LE edema Lymph-no adenopathy palpable Musculoskeletal-no joint tenderness, deformity or swelling Skin-warm and dry, no hyperpigmentation, vitiligo, or suspicious lesions  Neurological Examination  Mental Status: Alert.  Knows her age and birth date but can not tell me the month. Speech fluent at times and at other times is unintelligible.  Unable to name.  Follows simple commands but unable to follow 3 step commands.  Agitated and uncooperative at times.   Cranial Nerves: II: Discs flat bilaterally; Visual fields grossly normal, pupils equal, round, reactive to light and accommodation III,IV, VI: ptosis not present, extra-ocular motions intact bilaterally V,VII: smile symmetric, facial light touch sensation normal bilaterally VIII: hearing normal bilaterally IX,X: gag reflex present XI: bilateral shoulder shrug XII: midline tongue extension Motor: Moves all extremities against gravity with less movement noted in the RUE Sensory: Appreciates light touch throughout but unclear if there are differences Deep Tendon Reflexes: 2+ and symmetric with absent AJ's bilaterally Plantars: Right: downgoing   Left: downgoing Cerebellar: Unable to perform due to level of cooperation Gait: not tested due to safety concerns   Laboratory Studies:  Basic Metabolic Panel: Recent Labs  Lab 05/15/18 2202 05/16/18 0656  NA 138  --   K 4.0  --   CL 103  --   CO2 27  --   GLUCOSE  87  --   BUN 12  --   CREATININE 0.89  --   CALCIUM 9.1  --   MG  --  2.6*    Liver Function Tests: No results for input(s): AST, ALT, ALKPHOS, BILITOT, PROT, ALBUMIN in the last 168 hours. No results for input(s): LIPASE, AMYLASE in the last 168 hours. No results for input(s): AMMONIA in the last 168 hours.  CBC: Recent Labs  Lab 05/15/18 2202 05/16/18 0659  WBC 6.1 6.4  HGB 13.2 14.0  HCT 41.1 42.6  MCV 82.6 82.0  PLT 238 248    Cardiac Enzymes: Recent Labs  Lab 05/15/18 2202 05/16/18 0009 05/16/18 0228 05/16/18 0659  TROPONINI 0.04* 0.08* 0.13* 0.18*    BNP: Invalid input(s): POCBNP  CBG: Recent Labs  Lab 05/16/18 1311  GLUCAP 42    Microbiology: No results found for this or any previous visit.  Coagulation Studies: Recent Labs    05/16/18 0009  LABPROT 12.5  INR 0.94    Urinalysis: No results for input(s): COLORURINE, LABSPEC, PHURINE, GLUCOSEU, HGBUR, BILIRUBINUR, KETONESUR, PROTEINUR, UROBILINOGEN, NITRITE, LEUKOCYTESUR in the last 168 hours.  Invalid input(s): APPERANCEUR  Lipid Panel:    Component Value Date/Time   CHOL 232 (H) 05/16/2018 0009   TRIG 43 05/16/2018 0009   HDL 55 05/16/2018 0009   CHOLHDL 4.2 05/16/2018  0009   VLDL 9 05/16/2018 0009   LDLCALC 168 (H) 05/16/2018 0009    HgbA1C:  Lab Results  Component Value Date   HGBA1C 5.7 (H) 05/16/2018    Urine Drug Screen:  No results found for: LABOPIA, COCAINSCRNUR, LABBENZ, AMPHETMU, THCU, LABBARB  Alcohol Level: No results for input(s): ETH in the last 168 hours.  Other results: EKG: sinus rhythm at 83 bpm.  Imaging: Dg Chest 2 View  Result Date: 05/15/2018 CLINICAL DATA:  Chest pain EXAM: CHEST - 2 VIEW COMPARISON:  None. FINDINGS: Diffuse interstitial coarsening. No focal airspace consolidation. No pleural effusion or pneumothorax. Normal cardiomediastinal contours. IMPRESSION: Possible chronic bronchitis.  No pneumonia or pulmonary edema. Electronically Signed   By:  Ulyses Jarred M.D.   On: 05/15/2018 22:21   Ct Head Code Stroke Wo Contrast`  Result Date: 05/16/2018 CLINICAL DATA:  Code stroke. Confusion, acute, unexplained. Cardiac catheterization this morning. EXAM: CT HEAD WITHOUT CONTRAST TECHNIQUE: Contiguous axial images were obtained from the base of the skull through the vertex without intravenous contrast. COMPARISON:  None. FINDINGS: Brain: No evidence of acute infarction, hemorrhage, hydrocephalus, extra-axial collection or mass lesion/mass effect. Well-defined low-density curvilinear infarct in the right basal ganglia and corona radiata, lateral lenticulostriate distribution. This has a chronic appearance. Vascular: High-density vessels in the setting of recent cardiac catheterization. Atherosclerotic calcification Skull: Negative Sinuses/Orbits: Negative Other: These results were called by telephone at the time of interpretation on 05/16/2018 at 1:38 pm to Dr. Vaughan Basta , who verbally acknowledged these results. ASPECTS [facility] Stroke Program Early CT Score) Not graded with this history IMPRESSION: 1. No acute finding. 2. 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 Electronically Signed   By: Monte Fantasia M.D.   On: 05/16/2018 13:39    Assessment: 57 y.o. female with headache, confusion and slurred speech after cath.  Head CT reviewed and shows no acute changes.  Due to nausea and vomiting posterior circulation event was considered.  CTA was attempted but patient was not cooperative even after further treatment of nausea and vomiting.  GI symptoms improved although patient remained agitated.  Patient to be admitted to ICU for further management.   Echocardiogram after procedure shows no cardiac source of emboli with an EF of 55-60%.  A1c 5.7, LDL 168. Although stroke remains on the differential, can not rule out the possibility of changes being related to medications.    Case discussed with family and Dr.  Anselm Jungling  Stroke Risk Factors - hypertension  Plan: 1. Statin for lipid management with target LDL<70. 2. Unable to perform MRI, MRA at this time due to stent 3. PT consult, OT consult, Speech consult 4. Carotid dopplers 5. Prophylactic therapy-ASA 6. NPO until RN stroke swallow screen 7. Telemetry monitoring 8. Frequent neuro checks 9. Will continue to follow with you  Alexis Goodell, MD Neurology [phone] 05/16/2018, 2:25 PM

## 2018-05-16 NOTE — CV Procedure (Signed)
Cath done via right radial artery. It showed severe two-vessel coronary artery disease with 95% stenosis in the proximal/ostial LAD which is the culprit and 90% stenosis in mid RCA.  I performed successful PCI and drug-eluting stent placement to the proximal LAD.  The patient will require staged RCA PCI on Monday. Full cath report to follow.

## 2018-05-16 NOTE — Progress Notes (Addendum)
Patient to [facility] for recovery post cardiac cath alert and oriented, LOC WNL at this time. Displaying poor attention/concentration around 1200 with complaints of headache. Discussed with Dr Fletcher Anon and IV medication given per orders with progression of patient inattention amd continued complaint of headache, although improved per patient report.  Around 1300, patient unable to verbalize response to questions, mumbling, code stroke called with team response. Pt taken to CT, neuro at bedside. Dr Fletcher Anon updated on pt condition. Patient progressively more combative and confused, unable to complete CTA due to confusion. Difficult to get reliable blood pressure reading due to patient combativeness. Transfer to ICU, bedside report given to [facility] RN.

## 2018-05-16 NOTE — Progress Notes (Signed)
Chaplain responded to a code stroke. Pt was in [place] [facility]. Chaplain went to ED to fine Pt but encountered them in hallway of CT. Chaplain escorted daughter to Speciality waiting room and practiced [pastoral presence and communicated with care team and family. Pt son arrived. Chaplain was presence whti Dr. Chesley Noon family. Chaplain will follow up . Pt will be sent to 2A   05/16/18 1400  Clinical Encounter Type  Visited With Patient;Family  Visit Type Code  Referral From Physician  Spiritual Encounters  Spiritual Needs Prayer;Emotional

## 2018-05-16 NOTE — Consult Note (Signed)
Cardiology Consultation:   Patient ID: Kelsey Terry; [id number]; 04/18/1961   Admit date: 05/15/2018 Date of Consult: 05/16/2018  Primary Care Provider: System, Pcp Not In Primary Cardiologist: New to [facility] - consult by Arida   Patient Profile:   Kelsey Terry is a 57 y.o. female with a hx of pericarditis s/p pericardiocentesis ~ 7-8 years prior in [place], HTN, asthma, and lymphedema who is being seen today for the evaluation of NSTEMI at the request of Dr. Marcille Blanco.  History of Present Illness:   [name] was previously followed by cardiology in [place]. She reports being admitted to [facility] in [place] ~ 7-8 years prior with substernal chest pain with workup revealing pericarditis. She underwent successful pericardiocentesis. She thinks she possibly underwent LHC during that time, though is not certain. Chart biopsy, including Care Everywhere with query for [place], [place] does not show any records for review. Patient moved down to [place] from [place] ~ 2 months prior.   Patient went outside on 6/25 to walk her daughter's dog. Upon going outside in the heat she developed severe, substernal chest pain with associated SOB. Pain did not radiate. No associated nausea, vomiting, diaphoresis, palpitations, dizziness, presyncope, or syncope. There has been some associated increased fatigue over the past several weeks. She came back inside and laid down with improvement in symptoms. She was relatively asymptomatic on 6/26. She had a busy day on 6/27 and was out running errands from ~ 12-4. Upon getting home she continued to be busy and then noticed a return of her chest pain with associated SOB. She tried to lay down to see if this would help again, though her pain persisted. Pain felt somewhat similar to her prior pericarditis. Because her pain persisted she presented to [facility] ED.   Upon the patient's arrival to [facility] they were found to have BP 169.96, HR 89 bpm, temp 98.1, oxygen saturation 99%  on room air, weight 180 pounds. EKG as below, CXR showed possible chronic bronchitis. Labs showed troponin 0.04-->0.08-->0.13, Na 138, K+ 4.0, SCr 0.89, WBC 6.1, HGB 13.2, PLT 238, LDL 168, TSH 1.115, A1c pending. She was given SL NTG x 1 in the ED and started on heparin gtt with bolus. She has been chest pain free since.   Past Medical History:  Diagnosis Date  . Asthma   . Hypertension   . Lymphedema    LLE  . Pericarditis    a. ~ 2012 in [place], [place] s/p pericardiocentesis     Past Surgical History:  Procedure Laterality Date  . CESAREAN SECTION    . PERICARDIOCENTESIS       Home Meds: Prior to Admission medications   Medication Sig Start Date End Date Taking? Authorizing Provider  losartan-hydrochlorothiazide (HYZAAR) 100-25 MG tablet Take 1 tablet by mouth daily.   Yes [provider]  potassium chloride (K-DUR) 10 MEQ tablet Take 20 mEq by mouth daily.   Yes [provider]    Inpatient Medications: Scheduled Meds: . aspirin      . aspirin EC  81 mg Oral Daily  . docusate sodium  100 mg Oral BID  . losartan  100 mg Oral Daily   And  . hydrochlorothiazide  25 mg Oral Daily  . potassium chloride SA  20 mEq Oral Daily  . sodium chloride flush  3 mL Intravenous Q12H   Continuous Infusions: . sodium chloride    . [START ON 05/17/2018] sodium chloride     Followed by  . [  START ON 05/17/2018] sodium chloride    . heparin 850 Units/hr (05/16/18 0010)   PRN Meds: sodium chloride, acetaminophen **OR** acetaminophen, nitroGLYCERIN, ondansetron **OR** ondansetron (ZOFRAN) IV, sodium chloride flush  Allergies:   Allergies  Allergen Reactions  . Amoxicillin Itching and Other (See Comments)    Reaction: bump in vaginal region Has patient had a PCN reaction causing immediate rash, facial/tongue/throat swelling, SOB or lightheadedness with hypotension: No Has patient had a PCN reaction causing severe rash involving mucus membranes or skin necrosis: No Has  patient had a PCN reaction that required hospitalization: No Has patient had a PCN reaction occurring within the last 10 years: No If all of the above answers are "NO", then may proceed with Cephalosporin use.     Social History:   Social History   Socioeconomic History  . Marital status: Widowed    Spouse name: Not on file  . Number of children: Not on file  . Years of education: Not on file  . Highest education level: Not on file  Occupational History  . Not on file  Social Needs  . Financial resource strain: Not on file  . Food insecurity:    Worry: Not on file    Inability: Not on file  . Transportation needs:    Medical: Not on file    Non-medical: Not on file  Tobacco Use  . Smoking status: Never Smoker  . Smokeless tobacco: Never Used  Substance and Sexual Activity  . Alcohol use: Not Currently  . Drug use: Never  . Sexual activity: Not on file  Lifestyle  . Physical activity:    Days per week: Not on file    Minutes per session: Not on file  . Stress: Not on file  Relationships  . Social connections:    Talks on phone: Not on file    Gets together: Not on file    Attends religious service: Not on file    Active member of club or organization: Not on file    Attends meetings of clubs or organizations: Not on file    Relationship status: Not on file  . Intimate partner violence:    Fear of current or ex partner: Not on file    Emotionally abused: Not on file    Physically abused: Not on file    Forced sexual activity: Not on file  Other Topics Concern  . Not on file  Social History Narrative  . Not on file     Family History:   Family History  Problem Relation Age of Onset  . Hypertension Mother   . Pancreatic cancer Father     ROS:  Review of Systems  Constitutional: Positive for malaise/fatigue. Negative for chills, diaphoresis, fever and weight loss.  HENT: Negative for congestion.   Eyes: Negative for discharge and redness.  Respiratory:  Positive for shortness of breath. Negative for cough, hemoptysis, sputum production and wheezing.   Cardiovascular: Positive for chest pain and leg swelling. Negative for palpitations, orthopnea, claudication and PND.       Chronic lymphedema - improved since moving to [place] from [place]  Gastrointestinal: Negative for abdominal pain, blood in stool, heartburn, melena, nausea and vomiting.  Genitourinary: Negative for hematuria.  Musculoskeletal: Negative for falls and myalgias.  Skin: Negative for rash.  Neurological: Positive for weakness. Negative for dizziness, tingling, tremors, sensory change, speech change, focal weakness and loss of consciousness.  Endo/Heme/Allergies: Does not bruise/bleed easily.  Psychiatric/Behavioral: Negative for substance abuse. The patient  is not nervous/anxious.   All other systems reviewed and are negative.     Physical Exam/Data:   Vitals:   05/15/18 2318 05/16/18 0015 05/16/18 0130 05/16/18 0411  BP:  (!) 163/78 (!) 160/82 (!) 145/63  Pulse: 72 67 65 (!) 58  Resp: 20 19 18 18   Temp:   98 F (36.7 C) 97.8 F (36.6 C)  TempSrc:   Oral Oral  SpO2: 98% 98% 100% 100%  Weight:   199 lb 3.2 oz (90.4 kg)   Height:   5\' 3"  (1.6 m)     Intake/Output Summary (Last 24 hours) at 05/16/2018 0750 Last data filed at 05/16/2018 0700 Gross per 24 hour  Intake 559.22 ml  Output 200 ml  Net 359.22 ml   Filed Weights   05/15/18 2245 05/16/18 0130  Weight: 180 lb (81.6 kg) 199 lb 3.2 oz (90.4 kg)   Body mass index is 35.29 kg/m.   Physical Exam: General: Well developed, well nourished, in no acute distress. Head: Normocephalic, atraumatic, sclera non-icteric, no xanthomas, nares without discharge.  Neck: Negative for carotid bruits. JVD not elevated. Lungs: Clear bilaterally to auscultation without wheezes, rales, or rhonchi. Breathing is unlabored. Heart: RRR with S1 S2. No murmurs, rubs, or gallops appreciated. Abdomen: Soft, non-tender, non-distended with  normoactive bowel sounds. No hepatomegaly. No rebound/guarding. No obvious abdominal masses. Msk:  Strength and tone appear normal for age. Extremities: No clubbing or cyanosis. 1+ non-pitting bilateral lower extremity edema. Distal pedal pulses are 2+ and equal bilaterally. Neuro: Alert and oriented X 3. No facial asymmetry. No focal deficit. Moves all extremities spontaneously. Psych:  Responds to questions appropriately with a normal affect.   EKG:  The EKG was personally reviewed and demonstrates: 05/15/2018, 22:01 - NSR, 72 bpm, possible prior inferior infarct, possible prior anterior infarct, TWI leads I, aVL, V2-V3. Repeat EKG 22:21 NSR, 83 bpm, 1 mm anterior st depression, lateral TWI Telemetry:  Telemetry was personally reviewed and demonstrates: NSR, occasional PVCs  Weights: Filed Weights   05/15/18 2245 05/16/18 0130  Weight: 180 lb (81.6 kg) 199 lb 3.2 oz (90.4 kg)    Relevant CV Studies: None available for review in Epic or Care Everywhere (checked [place], [place])  Laboratory Data:  Chemistry Recent Labs  Lab 05/15/18 2202  NA 138  K 4.0  CL 103  CO2 27  GLUCOSE 87  BUN 12  CREATININE 0.89  CALCIUM 9.1  GFRNONAA >60  GFRAA >60  ANIONGAP 8    No results for input(s): PROT, ALBUMIN, AST, ALT, ALKPHOS, BILITOT in the last 168 hours. Hematology Recent Labs  Lab 05/15/18 2202 05/16/18 0659  WBC 6.1 6.4  RBC 4.98 5.19  HGB 13.2 14.0  HCT 41.1 42.6  MCV 82.6 82.0  MCH 26.6 27.0  MCHC 32.2 33.0  RDW 15.0* 14.7*  PLT 238 248   Cardiac Enzymes Recent Labs  Lab 05/15/18 2202 05/16/18 0009 05/16/18 0228  TROPONINI 0.04* 0.08* 0.13*   No results for input(s): TROPIPOC in the last 168 hours.  BNPNo results for input(s): BNP, PROBNP in the last 168 hours.  DDimer No results for input(s): DDIMER in the last 168 hours.  Radiology/Studies:  Dg Chest 2 View  Result Date: 05/15/2018 IMPRESSION: Possible chronic bronchitis.  No pneumonia or pulmonary edema.  Electronically Signed   By: Ulyses Jarred M.D.   On: 05/15/2018 22:21    Assessment and Plan:   1. NSTEMI: -Currently, chest pain free -Troponin has trended up to 0.13 currently,  continue to cycle until peak -EKG with anterolateral ischemic changes as above -ASA -Heparin gtt -NPO -Schedule for LHC this morning with Dr. Fletcher Anon -Risks and benefits of cardiac catheterization have been discussed with the patient including risks of bleeding, bruising, infection, kidney damage, stroke, heart attack, and death. The patient understands these risks and is willing to proceed with the procedure. All questions have been answered and concerns listened to -TTE -Lipitor  -A1c pending  2. History of pericarditis: -Check TTE as above  3. HLD: -Start Lipitor 80 mg daily  4. HTN: -Improving -Add Coreg 3.125 mg bid -Continue Hyzaar  5. PVCs: -Ischemic evaluation as above -Add Coreg -Check magnesium -TSH normal -Potassium at goal  6. Lymphedema: -Reports this is chronic and improved -Wears support stockings -TTE as above  7. Obesity: -Weight loss advised     For questions or updates, please contact [place] Please consult [email or web address] for contact info under Cardiology/STEMI.   Signed, Christell Faith, PA-C [place] Pager: [phone] 05/16/2018, 7:50 AM

## 2018-05-16 NOTE — Progress Notes (Signed)
To cath lab via bed, Heparin infusing at 8.5u/hr

## 2018-05-16 NOTE — Plan of Care (Signed)
  Problem: Activity: Goal: Risk for activity intolerance will decrease Outcome: Progressing   Problem: Pain Managment: Goal: General experience of comfort will improve Outcome: Progressing   Problem: Activity: Goal: Ability to tolerate increased activity will improve Outcome: Progressing   Problem: Education: Goal: Knowledge of General Education information will improve Outcome: Completed/Met  Admitted from ER with chest pain, positive troponins, heparin gtt infusing, no chest pain since admitted.

## 2018-05-16 NOTE — Consult Note (Signed)
Reason for Consult:to assist with critical care management and frequent neurologic checks Referring Physician: Dr. [name] is an 57 y.o. female.  HPI: Kelsey Terry is a 57 year old African-American female with a past medical history remarkable for hypertension, asthma, lymphedema, prior history of pericarditis, status post pericardiocentesis, presented to the emergency department with complaints of chest pain. Her EKG was consistent with anteroseptal ischemia, she was started on heparin and cardiology was consulted. 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 Status post drug-eluting stent to the proximal LAD. Post cardiac catheterization the patient had acute onset of altered mental status and confusion. Code stroke was called, CT scan of head was negative for acute intracranial hemorrhage, neurology was consulted and patient was moved to the intensive care unit for further evaluation and management. She was unable to have CT angiogram secondary to confusional state. In the intensive care unit she is encephalopathic and agitated. During the procedure she received Versed and fentanyl.  In the intensive care unit we started her on Precedex with some improvement in agitation.  Past Medical History:  Diagnosis Date  . Asthma   . Hypertension   . Lymphedema    LLE  . Pericarditis    a. ~ 2012 in [place], [place] s/p pericardiocentesis     Past Surgical History:  Procedure Laterality Date  . CESAREAN SECTION    . PERICARDIOCENTESIS      Family History  Problem Relation Age of Onset  . Hypertension Mother   . Pancreatic cancer Father     Social History:  reports that she has never smoked. She has never used smokeless tobacco. She reports that she drank alcohol. She reports that she does not use drugs.  Allergies:  Allergies  Allergen Reactions  . Amoxicillin Itching and Other (See Comments)   Reaction: bump in vaginal region Has patient had a PCN reaction causing immediate rash, facial/tongue/throat swelling, SOB or lightheadedness with hypotension: No Has patient had a PCN reaction causing severe rash involving mucus membranes or skin necrosis: No Has patient had a PCN reaction that required hospitalization: No Has patient had a PCN reaction occurring within the last 10 years: No If all of the above answers are "NO", then may proceed with Cephalosporin use.     Medications: I have reviewed the patient's current medications.  Results for orders placed or performed during the hospital encounter of 05/15/18 (from the past 48 hour(s))  Basic metabolic panel     Status: None   Collection Time: 05/15/18 10:02 PM  Result Value Ref Range   Sodium 138 135 - 145 mmol/L   Potassium 4.0 3.5 - 5.1 mmol/L    Comment: HEMOLYSIS AT THIS LEVEL MAY AFFECT RESULT   Chloride 103 98 - 111 mmol/L    Comment: Please note change in reference range.   CO2 27 22 - 32 mmol/L   Glucose, Bld 87 70 - 99 mg/dL    Comment: Please note change in reference range.   BUN 12 6 - 20 mg/dL    Comment: Please note change in reference range.   Creatinine, Ser 0.89 0.44 - 1.00 mg/dL   Calcium 9.1 8.9 - 10.3 mg/dL   GFR calc non Af Amer >60 >60 mL/min   GFR calc Af Amer >60 >60 mL/min    Comment: (NOTE) The eGFR has been calculated using the CKD EPI equation. This calculation has not been validated in all clinical situations. eGFR's  persistently <60 mL/min signify possible Chronic Kidney Disease.    Anion gap 8 5 - 15    Comment: Performed at [facility], [address] [place], [place] [address]  CBC     Status: Abnormal   Collection Time: 05/15/18 10:02 PM  Result Value Ref Range   WBC 6.1 3.6 - 11.0 K/uL   RBC 4.98 3.80 - 5.20 MIL/uL   Hemoglobin 13.2 12.0 - 16.0 g/dL   HCT 41.1 35.0 - 47.0 %   MCV 82.6 80.0 - 100.0 fL   MCH 26.6 26.0 - 34.0 pg   MCHC 32.2 32.0 - 36.0 g/dL   RDW 15.0  (H) 11.5 - 14.5 %   Platelets 238 150 - 440 K/uL    Comment: Performed at [facility], [address] [place], [place] [address]  Troponin I     Status: Abnormal   Collection Time: 05/15/18 10:02 PM  Result Value Ref Range   Troponin I 0.04 (HH) <0.03 ng/mL    Comment: CRITICAL RESULT CALLED TO, READ BACK BY AND VERIFIED WITH NICOLE COOK @2237  05/15/18 AKT Performed at [facility], [address] [place], [place] [address]   Protime-INR     Status: None   Collection Time: 05/16/18 12:09 AM  Result Value Ref Range   Prothrombin Time 12.5 11.4 - 15.2 seconds   INR 0.94     Comment: Performed at [facility], [address] [place], [place] [address]  APTT     Status: Abnormal   Collection Time: 05/16/18 12:09 AM  Result Value Ref Range   aPTT 76 (H) 24 - 36 seconds    Comment:        IF BASELINE aPTT IS ELEVATED, SUGGEST PATIENT RISK ASSESSMENT BE USED TO DETERMINE APPROPRIATE ANTICOAGULANT THERAPY. Performed at [facility], [address] [place], [place] [address]   Lipid panel     Status: Abnormal   Collection Time: 05/16/18 12:09 AM  Result Value Ref Range   Cholesterol 232 (H) 0 - 200 mg/dL   Triglycerides 43 <150 mg/dL   HDL 55 >40 mg/dL   Total CHOL/HDL Ratio 4.2 RATIO   VLDL 9 0 - 40 mg/dL   LDL Cholesterol 168 (H) 0 - 99 mg/dL    Comment:        Total Cholesterol/HDL:CHD Risk Coronary Heart Disease Risk Table                     Men   Women  1/2 Average Risk   3.4   3.3  Average Risk       5.0   4.4  2 X Average Risk   9.6   7.1  3 X Average Risk  23.4   11.0        Use the calculated Patient Ratio above and the CHD Risk Table to determine the patient's CHD Risk.        ATP III CLASSIFICATION (LDL):  <100     mg/dL   Optimal  100-129  mg/dL   Near or Above                    Optimal  130-159  mg/dL   Borderline  160-189  mg/dL   High  >190     mg/dL   Very High Performed at [facility], [address] [place], [place] [address]   Troponin I     Status: Abnormal  Collection Time: 05/16/18 12:09 AM  Result Value Ref Range   Troponin I 0.08 (HH) <0.03 ng/mL    Comment: CRITICAL RESULT CALLED TO, READ BACK BY AND VERIFIED WITH LEXI MILLER @0130  05/16/18 AKT Performed at [facility], [address] [place], [place] [address]   TSH     Status: None   Collection Time: 05/16/18  2:28 AM  Result Value Ref Range   TSH 1.115 0.350 - 4.500 uIU/mL    Comment: Performed by a 3rd Generation assay with a functional sensitivity of <=0.01 uIU/mL. Performed at [facility], [address] [place], [place] [address]   Troponin I     Status: Abnormal   Collection Time: 05/16/18  2:28 AM  Result Value Ref Range   Troponin I 0.13 (HH) <0.03 ng/mL    Comment: CRITICAL VALUE NOTED. VALUE IS CONSISTENT WITH PREVIOUSLY REPORTED/CALLED VALUE JJB Performed at [facility], [address] [place], [place] [address]   Hemoglobin A1c     Status: Abnormal   Collection Time: 05/16/18  2:28 AM  Result Value Ref Range   Hgb A1c MFr Bld 5.7 (H) 4.8 - 5.6 %    Comment: (NOTE) Pre diabetes:          5.7%-6.4% Diabetes:              >6.4% Glycemic control for   <7.0% adults with diabetes    Mean Plasma Glucose 116.89 mg/dL    Comment: Performed at [facility] [address] [place], [place] [address]  Magnesium     Status: Abnormal   Collection Time: 05/16/18  6:56 AM  Result Value Ref Range   Magnesium 2.6 (H) 1.7 - 2.4 mg/dL    Comment: Performed at [facility], [place], [place] [address]  Heparin level (unfractionated)     Status: None   Collection Time: 05/16/18  6:59 AM  Result Value Ref Range   Heparin Unfractionated 0.40 0.30 - 0.70 IU/mL    Comment: (NOTE) If heparin results are below expected values, and patient dosage has  been confirmed, suggest follow up testing of antithrombin III levels. Performed at [facility], [address] [place], [place] [address]   Troponin I     Status: Abnormal   Collection Time: 05/16/18  6:59 AM  Result Value Ref Range   Troponin I 0.18 (HH) <0.03 ng/mL    Comment: CRITICAL VALUE NOTED. VALUE IS CONSISTENT WITH PREVIOUSLY REPORTED/CALLED VALUE DAS Performed at [facility], [address] [place], [place] [address]   CBC     Status: Abnormal   Collection Time: 05/16/18  6:59 AM  Result Value Ref Range   WBC 6.4 3.6 - 11.0 K/uL   RBC 5.19 3.80 - 5.20 MIL/uL   Hemoglobin 14.0 12.0 - 16.0 g/dL   HCT 42.6 35.0 - 47.0 %   MCV 82.0 80.0 - 100.0 fL   MCH 27.0 26.0 - 34.0 pg   MCHC 33.0 32.0 - 36.0 g/dL   RDW 14.7 (H) 11.5 - 14.5 %   Platelets 248 150 - 440 K/uL    Comment: Performed at [facility], [address] [place], [place] [address]  POCT Activated clotting time     Status: None   Collection Time: 05/16/18 10:47 AM  Result Value Ref Range   Activated Clotting Time 279 seconds  Glucose, capillary     Status: None   Collection Time: 05/16/18  1:11  PM  Result Value Ref Range   Glucose-Capillary 79 70 - 99 mg/dL  Glucose, capillary     Status: Abnormal   Collection Time: 05/16/18  3:33 PM  Result Value Ref Range   Glucose-Capillary 112 (H) 70 - 99 mg/dL    Dg Chest 2 View  Result Date: 05/15/2018 CLINICAL DATA:  Chest pain EXAM: CHEST - 2 VIEW COMPARISON:  None. FINDINGS: Diffuse interstitial coarsening. No focal airspace consolidation. No pleural effusion or pneumothorax. Normal cardiomediastinal contours. IMPRESSION: Possible chronic bronchitis.  No pneumonia or pulmonary edema. Electronically Signed   By: Ulyses Jarred M.D.   On: 05/15/2018 22:21   Ct Head Code Stroke Wo Contrast`  Result Date: 05/16/2018 CLINICAL DATA:  Code stroke. Confusion, acute, unexplained. Cardiac catheterization this morning. EXAM: CT HEAD WITHOUT CONTRAST TECHNIQUE: Contiguous axial images were obtained from the base of the skull through the vertex  without intravenous contrast. COMPARISON:  None. FINDINGS: Brain: No evidence of acute infarction, hemorrhage, hydrocephalus, extra-axial collection or mass lesion/mass effect. Well-defined low-density curvilinear infarct in the right basal ganglia and corona radiata, lateral lenticulostriate distribution. This has a chronic appearance. Vascular: High-density vessels in the setting of recent cardiac catheterization. Atherosclerotic calcification Skull: Negative Sinuses/Orbits: Negative Other: These results were called by telephone at the time of interpretation on 05/16/2018 at 1:38 pm to Dr. Vaughan Basta , who verbally acknowledged these results. ASPECTS [facility] Stroke Program Early CT Score) Not graded with this history IMPRESSION: 1. No acute finding. 2. 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 Electronically Signed   By: Monte Fantasia M.D.   On: 05/16/2018 13:39    ROS Blood pressure (!) 191/101, pulse 85, temperature 98.9 F (37.2 C), temperature source Axillary, resp. rate 20, height 5' 3"  (1.6 m), weight 199 lb 3.2 oz (90.4 kg), SpO2 98 %. Physical Exam  Patient is awake, alert, confused and agitated Vital signs: Please see the above listed vital signs HEENT: No obvious cranial nerve abnormalities, trachea midline, no accessory muscle utilization noted Cardiovascular: Regular rate and rhythm Pulmonary: Clear to auscultation Abdominal: Positive bowel sounds, soft exam Extremity: 2+ edema noted Neurologic: Patient moves all extremities but limited exam Cutaneous: No rashes or lesions noted  Assessment/Plan:  Status post cardiac catheterization with dual vessel disease, status post LAD PCI/DES. Post procedure patient developed acute onset of confusion concerning for embolic stroke. CT scan of the head without contrast negative. Patient was too confused and encephalopathic to obtain CT angiogram. We'll place on Precedex infusion and if able will send down for  CT angiogram. Will monitor closely intensive care unit, Dr. Doy Mince is actively following  [place] 05/16/2018, 3:37 PM

## 2018-05-16 NOTE — Progress Notes (Signed)
ANTICOAGULATION CONSULT NOTE - Follow up [facility] for Heparin  Indication: chest pain/ACS  Allergies  Allergen Reactions  . Amoxicillin Itching and Other (See Comments)    Reaction: bump in vaginal region Has patient had a PCN reaction causing immediate rash, facial/tongue/throat swelling, SOB or lightheadedness with hypotension: No Has patient had a PCN reaction causing severe rash involving mucus membranes or skin necrosis: No Has patient had a PCN reaction that required hospitalization: No Has patient had a PCN reaction occurring within the last 10 years: No If all of the above answers are "NO", then may proceed with Cephalosporin use.     Patient Measurements: Height: 5\' 3"  (160 cm) Weight: 199 lb 3.2 oz (90.4 kg) IBW/kg (Calculated) : 52.4 Heparin Dosing Weight: 70.3 kg --> 73 kg  Vital Signs: Temp: 98.4 F (36.9 C) (06/28 0807) Temp Source: Oral (06/28 0807) BP: 175/90 (06/28 0807) Pulse Rate: 62 (06/28 0757)  Labs: Recent Labs    05/15/18 2202 05/16/18 0009 05/16/18 0228 05/16/18 0659  HGB 13.2  --   --  14.0  HCT 41.1  --   --  42.6  PLT 238  --   --  248  APTT  --  76*  --   --   LABPROT  --  12.5  --   --   INR  --  0.94  --   --   HEPARINUNFRC  --   --   --  0.40  CREATININE 0.89  --   --   --   TROPONINI 0.04* 0.08* 0.13* 0.18*    Estimated Creatinine Clearance: 73.5 mL/min (by C-G formula based on SCr of 0.89 mg/dL).  Assessment: Pharmacy consulted to dose heparin in this 57 year old female with ACS/NSTEMI .   No prior anticoag noted   Goal of Therapy:  Heparin level 0.3-0.7 units/ml Monitor platelets by anticoagulation protocol: Yes   Plan:  Give 4000 units bolus x 1 Start heparin infusion at 850 units/hr Check anti-Xa level in 6 hours and daily while on heparin Continue to monitor H&H and platelets   First heparin level therapeutic at 0.40. Would continue heparin drip at current rate. Will follow up plan after cath. Hgb  and plt count improved/stable from yesterday.   Pharmacy will continue to follow.   Rocky Morel 05/16/2018,8:08 AM

## 2018-05-16 NOTE — H&P (Signed)
Kelsey Terry is an 57 y.o. female.   Chief Complaint: Chest pain HPI: The patient with past history of hypertension and pericarditis presents emergency department complaining of chest pain.  She reports with lingering chest pain since yesterday.  The pain is centrally located.  She denies shortness of breath, nausea, vomiting or diaphoresis.  She has had similar pain in the past but the pain is exacerbated during this episode with minimal exertion.  The patient took aspirin which reduced her pain but examination of her EKG in the emergency department revealed anteroseptal ischemia which prompted the emergency department to initiate heparin drip prior to calling the hospitalist service for admission.  Past Medical History:  Diagnosis Date  . Asthma   . Hypertension   . Lymphedema    LLE  . Pericarditis     Past Surgical History:  Procedure Laterality Date  . CESAREAN SECTION    . PERICARDIOCENTESIS      History reviewed. No pertinent family history.  Hypertension Social History:  reports that she has never smoked. She has never used smokeless tobacco. She reports that she drank alcohol. Her drug history is not on file.  Allergies:  Allergies  Allergen Reactions  . Amoxicillin Itching and Other (See Comments)    Reaction: bump in vaginal region Has patient had a PCN reaction causing immediate rash, facial/tongue/throat swelling, SOB or lightheadedness with hypotension: No Has patient had a PCN reaction causing severe rash involving mucus membranes or skin necrosis: No Has patient had a PCN reaction that required hospitalization: No Has patient had a PCN reaction occurring within the last 10 years: No If all of the above answers are "NO", then may proceed with Cephalosporin use.     Medications Prior to Admission  Medication Sig Dispense Refill  . losartan-hydrochlorothiazide (HYZAAR) 100-25 MG tablet Take 1 tablet by mouth daily.    . potassium chloride (K-DUR) 10 MEQ tablet Take  20 mEq by mouth daily.      Results for orders placed or performed during the hospital encounter of 05/15/18 (from the past 48 hour(s))  Basic metabolic panel     Status: None   Collection Time: 05/15/18 10:02 PM  Result Value Ref Range   Sodium 138 135 - 145 mmol/L   Potassium 4.0 3.5 - 5.1 mmol/L    Comment: HEMOLYSIS AT THIS LEVEL MAY AFFECT RESULT   Chloride 103 98 - 111 mmol/L    Comment: Please note change in reference range.   CO2 27 22 - 32 mmol/L   Glucose, Bld 87 70 - 99 mg/dL    Comment: Please note change in reference range.   BUN 12 6 - 20 mg/dL    Comment: Please note change in reference range.   Creatinine, Ser 0.89 0.44 - 1.00 mg/dL   Calcium 9.1 8.9 - 10.3 mg/dL   GFR calc non Af Amer >60 >60 mL/min   GFR calc Af Amer >60 >60 mL/min    Comment: (NOTE) The eGFR has been calculated using the CKD EPI equation. This calculation has not been validated in all clinical situations. eGFR's persistently <60 mL/min signify possible Chronic Kidney Disease.    Anion gap 8 5 - 15    Comment: Performed at [facility], [address] [place], [place] [address]  CBC     Status: Abnormal   Collection Time: 05/15/18 10:02 PM  Result Value Ref Range   WBC 6.1 3.6 - 11.0 K/uL   RBC 4.98 3.80 - 5.20 MIL/uL  Hemoglobin 13.2 12.0 - 16.0 g/dL   HCT 41.1 35.0 - 47.0 %   MCV 82.6 80.0 - 100.0 fL   MCH 26.6 26.0 - 34.0 pg   MCHC 32.2 32.0 - 36.0 g/dL   RDW 15.0 (H) 11.5 - 14.5 %   Platelets 238 150 - 440 K/uL    Comment: Performed at [facility], [address] [place], [place] [address]  Troponin I     Status: Abnormal   Collection Time: 05/15/18 10:02 PM  Result Value Ref Range   Troponin I 0.04 (HH) <0.03 ng/mL    Comment: CRITICAL RESULT CALLED TO, READ BACK BY AND VERIFIED WITH NICOLE COOK @2237  05/15/18 AKT Performed at [facility], [address] [place], [place] [address]   Protime-INR     Status: None   Collection Time: 05/16/18  12:09 AM  Result Value Ref Range   Prothrombin Time 12.5 11.4 - 15.2 seconds   INR 0.94     Comment: Performed at [facility], [address] [place], [place] [address]  APTT     Status: Abnormal   Collection Time: 05/16/18 12:09 AM  Result Value Ref Range   aPTT 76 (H) 24 - 36 seconds    Comment:        IF BASELINE aPTT IS ELEVATED, SUGGEST PATIENT RISK ASSESSMENT BE USED TO DETERMINE APPROPRIATE ANTICOAGULANT THERAPY. Performed at [facility], [address] [place], [place] [address]   Lipid panel     Status: Abnormal   Collection Time: 05/16/18 12:09 AM  Result Value Ref Range   Cholesterol 232 (H) 0 - 200 mg/dL   Triglycerides 43 <150 mg/dL   HDL 55 >40 mg/dL   Total CHOL/HDL Ratio 4.2 RATIO   VLDL 9 0 - 40 mg/dL   LDL Cholesterol 168 (H) 0 - 99 mg/dL    Comment:        Total Cholesterol/HDL:CHD Risk Coronary Heart Disease Risk Table                     Men   Women  1/2 Average Risk   3.4   3.3  Average Risk       5.0   4.4  2 X Average Risk   9.6   7.1  3 X Average Risk  23.4   11.0        Use the calculated Patient Ratio above and the CHD Risk Table to determine the patient's CHD Risk.        ATP III CLASSIFICATION (LDL):  <100     mg/dL   Optimal  100-129  mg/dL   Near or Above                    Optimal  130-159  mg/dL   Borderline  160-189  mg/dL   High  >190     mg/dL   Very High Performed at [facility], [address] [place], [place] [address]   Troponin I     Status: Abnormal   Collection Time: 05/16/18 12:09 AM  Result Value Ref Range   Troponin I 0.08 (HH) <0.03 ng/mL    Comment: CRITICAL RESULT CALLED TO, READ BACK BY AND VERIFIED WITH LEXI MILLER @0130  05/16/18 AKT Performed at [facility], [address], [place], [place] [address]   TSH     Status: None   Collection Time: 05/16/18  2:28 AM  Result Value Ref Range  TSH 1.115 0.350 - 4.500 uIU/mL    Comment: Performed by a 3rd Generation assay  with a functional sensitivity of <=0.01 uIU/mL. Performed at [facility], [address] [place], [place] [address]   Troponin I     Status: Abnormal   Collection Time: 05/16/18  2:28 AM  Result Value Ref Range   Troponin I 0.13 (HH) <0.03 ng/mL    Comment: CRITICAL VALUE NOTED. VALUE IS CONSISTENT WITH PREVIOUSLY REPORTED/CALLED VALUE JJB Performed at [facility], [address] [place],  [address]    Dg Chest 2 View  Result Date: 05/15/2018 CLINICAL DATA:  Chest pain EXAM: CHEST - 2 VIEW COMPARISON:  None. FINDINGS: Diffuse interstitial coarsening. No focal airspace consolidation. No pleural effusion or pneumothorax. Normal cardiomediastinal contours. IMPRESSION: Possible chronic bronchitis.  No pneumonia or pulmonary edema. Electronically Signed   By: Ulyses Jarred M.D.   On: 05/15/2018 22:21    Review of Systems  Constitutional: Negative for chills and fever.  HENT: Negative for sore throat and tinnitus.   Eyes: Negative for blurred vision and redness.  Respiratory: Negative for cough and shortness of breath.   Cardiovascular: Positive for chest pain. Negative for palpitations, orthopnea and PND.  Gastrointestinal: Negative for abdominal pain, diarrhea, nausea and vomiting.  Genitourinary: Negative for dysuria, frequency and urgency.  Musculoskeletal: Negative for joint pain and myalgias.  Skin: Negative for rash.       No lesions  Neurological: Negative for speech change, focal weakness and weakness.  Endo/Heme/Allergies: Does not bruise/bleed easily.       No temperature intolerance  Psychiatric/Behavioral: Negative for depression and suicidal ideas.    Blood pressure (!) 145/63, pulse (!) 58, temperature 97.8 F (36.6 C), temperature source Oral, resp. rate 18, height 5' 3"  (1.6 m), weight 90.4 kg (199 lb 3.2 oz), SpO2 100 %. Physical Exam  Vitals reviewed. Constitutional: She is oriented to person, place, and time. She appears well-developed  and well-nourished. No distress.  HENT:  Head: Normocephalic and atraumatic.  Mouth/Throat: Oropharynx is clear and moist.  Eyes: Pupils are equal, round, and reactive to light. Conjunctivae and EOM are normal. No scleral icterus.  Neck: Normal range of motion. Neck supple. No JVD present. No tracheal deviation present. No thyromegaly present.  Cardiovascular: Normal rate, regular rhythm and normal heart sounds. Exam reveals no gallop and no friction rub.  No murmur heard. Respiratory: Effort normal and breath sounds normal.  GI: Soft. Bowel sounds are normal. She exhibits no distension. There is no tenderness.  Genitourinary:  Genitourinary Comments: Deferred  Musculoskeletal: Normal range of motion. She exhibits no edema.  Lymphadenopathy:    She has no cervical adenopathy.  Neurological: She is alert and oriented to person, place, and time. No cranial nerve deficit. She exhibits normal muscle tone.  Skin: Skin is warm and dry. No rash noted. No erythema.  Psychiatric: She has a normal mood and affect. Her behavior is normal. Judgment and thought content normal.     Assessment/Plan This is a 57 year old female admitted for NSTEMI. 1.  NSTEMI: Continue heparin drip.  Control blood pressure.  Patient is n.p.o. in anticipation for heart catheterization. 2.  Hypertension: Relatively controlled; continue hydrochlorthiazide and losartan 3.  Hyperlipidemia: Continue statin therapy 4.  Asthma: Controlled 5.  DVT prophylaxis: Therapeutic heparin 6.  GI prophylaxis: None The patient is a full code.  Time spent on initial exam patient care approximately 45 minutes  Harrie Foreman, MD 05/16/2018, 6:07 AM

## 2018-05-16 NOTE — OR Nursing (Signed)
Summary.Marland KitchenMarland KitchenStatus post Cardiac Cath ....noted changes in LOC....continus yawing, word salad, unable to name objects. Code Stroke called at approx. 12:20 , Doctor Fletcher Anon notified and  Stroke team present at 12:20

## 2018-05-16 NOTE — Progress Notes (Signed)
*  PRELIMINARY RESULTS* Echocardiogram 2D Echocardiogram has been performed.  Kelsey Terry 05/16/2018, 11:52 AM

## 2018-05-16 NOTE — Progress Notes (Signed)
[place] [facility] [place] NAME: Kelsey Terry    MR#:  [id number]  DATE OF BIRTH:  01/27/1961  SUBJECTIVE:  CHIEF COMPLAINT:   Chief Complaint  Patient presents with  . Chest Pain    Came with chest pain- NSTEMI- cath done and LAD stent placed.   Had confusion and some agitation post OP, code stroke activated.  REVIEW OF SYSTEMS:  Pt was confused.  ROS  DRUG ALLERGIES:   Allergies  Allergen Reactions  . Amoxicillin Itching and Other (See Comments)    Reaction: bump in vaginal region Has patient had a PCN reaction causing immediate rash, facial/tongue/throat swelling, SOB or lightheadedness with hypotension: No Has patient had a PCN reaction causing severe rash involving mucus membranes or skin necrosis: No Has patient had a PCN reaction that required hospitalization: No Has patient had a PCN reaction occurring within the last 10 years: No If all of the above answers are "NO", then may proceed with Cephalosporin use.     VITALS:  Blood pressure (!) 179/110, pulse 77, temperature 98.9 F (37.2 C), temperature source Axillary, resp. rate (!) 23, height 5\' 3"  (1.6 m), weight 90.4 kg (199 lb 3.2 oz), SpO2 95 %.  PHYSICAL EXAMINATION:  GENERAL:  57 y.o.-year-old patient lying in the bed with no acute distress.  EYES: Pupils equal, round, reactive to light and accommodation. No scleral icterus. Extraocular muscles intact.  HEENT: Head atraumatic, normocephalic. Oropharynx and nasopharynx clear.  NECK:  Supple, no jugular venous distention. No thyroid enlargement, no tenderness.  LUNGS: Normal breath sounds bilaterally, no wheezing, rales,rhonchi or crepitation. No use of accessory muscles of respiration.  CARDIOVASCULAR: S1, S2 normal. No murmurs, rubs, or gallops.  ABDOMEN: Soft, nontender, nondistended. Bowel sounds present. No organomegaly or mass.  EXTREMITIES: No pedal edema, cyanosis, or clubbing.  NEUROLOGIC: Cranial nerves II through  XII are intact. Muscle strength 4/5 in all extremities. Sensation intact. Gait not checked. Not talking much. PSYCHIATRIC: The patient is alert and oriented x 0.  SKIN: No obvious rash, lesion, or ulcer.   Physical Exam LABORATORY PANEL:   CBC Recent Labs  Lab 05/16/18 0659  WBC 6.4  HGB 14.0  HCT 42.6  PLT 248   ------------------------------------------------------------------------------------------------------------------  Chemistries  Recent Labs  Lab 05/15/18 2202 05/16/18 0656  NA 138  --   K 4.0  --   CL 103  --   CO2 27  --   GLUCOSE 87  --   BUN 12  --   CREATININE 0.89  --   CALCIUM 9.1  --   MG  --  2.6*   ------------------------------------------------------------------------------------------------------------------  Cardiac Enzymes Recent Labs  Lab 05/16/18 0659 05/16/18 1623  TROPONINI 0.18* 0.18*   ------------------------------------------------------------------------------------------------------------------  RADIOLOGY:  Dg Chest 2 View  Result Date: 05/15/2018 CLINICAL DATA:  Chest pain EXAM: CHEST - 2 VIEW COMPARISON:  None. FINDINGS: Diffuse interstitial coarsening. No focal airspace consolidation. No pleural effusion or pneumothorax. Normal cardiomediastinal contours. IMPRESSION: Possible chronic bronchitis.  No pneumonia or pulmonary edema. Electronically Signed   By: Ulyses Jarred M.D.   On: 05/15/2018 22:21   Ct Head Code Stroke Wo Contrast`  Result Date: 05/16/2018 CLINICAL DATA:  Code stroke. Confusion, acute, unexplained. Cardiac catheterization this morning. EXAM: CT HEAD WITHOUT CONTRAST TECHNIQUE: Contiguous axial images were obtained from the base of the skull through the vertex without intravenous contrast. COMPARISON:  None. FINDINGS: Brain: No evidence of acute infarction, hemorrhage, hydrocephalus, extra-axial collection or mass  lesion/mass effect. Well-defined low-density curvilinear infarct in the right basal ganglia and  corona radiata, lateral lenticulostriate distribution. This has a chronic appearance. Vascular: High-density vessels in the setting of recent cardiac catheterization. Atherosclerotic calcification Skull: Negative Sinuses/Orbits: Negative Other: These results were called by telephone at the time of interpretation on 05/16/2018 at 1:38 pm to Dr. Vaughan Basta , who verbally acknowledged these results. ASPECTS [facility] Stroke Program Early CT Score) Not graded with this history IMPRESSION: 1. No acute finding. 2. 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 Electronically Signed   By: Monte Fantasia M.D.   On: 05/16/2018 13:39    ASSESSMENT AND PLAN:   Active Problems:   NSTEMI (non-ST elevated myocardial infarction) ([place])   *  NSTEMI: Continue heparin drip.     Stent placed in LAD, need staged PCI in RCA- likely Monday.   Cont ASA, brilianta, Statin, Coreg  * Altered mental status   Activated Code stroke in recovery area   CT head negative, seen by neurologist, due to recent Cath- and no major focal symptoms- not a candidate for TPA, transferred to stepdown.   CT angiogram ordered, may be donee- once pt is more co-operative. *   Hypertension: Relatively controlled; continue hydrochlorthiazide and losartan. *  Hyperlipidemia: Continue statin therapy *  Asthma: Controlled *  DVT prophylaxis: lovenox. *  GI prophylaxis: None    All the records are reviewed and case discussed with Care Management/Social Workerr. Management plans discussed with the patient, family and they are in agreement.  CODE STATUS: Full.  TOTAL TIME TAKING CARE OF THIS PATIENT: 35 minutes.     POSSIBLE D/C IN 1-2 DAYS, DEPENDING ON CLINICAL CONDITION.   Vaughan Basta M.D on 05/16/2018   Between 7am to 6pm - Pager - [phone]  After 6pm go to [email or web address] - password [email or web address] [facility]  Office  [phone]  CC: Primary care physician; System, Pcp  Not In  Note: This dictation was prepared with Dragon dictation along with smaller phrase technology. Any transcriptional errors that result from this process are unintentional.

## 2018-05-17 ENCOUNTER — Inpatient Hospital Stay: Payer: BLUE CROSS/BLUE SHIELD

## 2018-05-17 LAB — BASIC METABOLIC PANEL
Anion gap: 9 (ref 5–15)
Anion gap: 11 (ref 5–15)
BUN: 11 mg/dL (ref 6–20)
BUN: 15 mg/dL (ref 6–20)
CHLORIDE: 104 mmol/L (ref 98–111)
CO2: 24 mmol/L (ref 22–32)
CO2: 23 mmol/L (ref 22–32)
CREATININE: 0.8 mg/dL (ref 0.44–1.00)
Calcium: 8.5 mg/dL — ABNORMAL LOW (ref 8.9–10.3)
Calcium: 9.2 mg/dL (ref 8.9–10.3)
Chloride: 104 mmol/L (ref 98–111)
Creatinine, Ser: 0.72 mg/dL (ref 0.44–1.00)
GFR calc Af Amer: >60 mL/min (ref >60)
GFR calc Af Amer: >60 mL/min (ref >60)
GFR calc non Af Amer: >60 mL/min (ref >60)
GLUCOSE: 95 mg/dL (ref 70–99)
GLUCOSE: 91 mg/dL (ref 70–99)
Potassium: 3 mmol/L — ABNORMAL LOW (ref 3.5–5.1)
Potassium: 3.7 mmol/L (ref 3.5–5.1)
Sodium: 137 mmol/L (ref 135–145)
Sodium: 138 mmol/L (ref 135–145)

## 2018-05-17 LAB — CBC
HCT: 40.7 % (ref 35.0–47.0)
Hemoglobin: 13.3 g/dL (ref 12.0–16.0)
MCH: 26.8 pg (ref 26.0–34.0)
MCHC: 32.7 g/dL (ref 32.0–36.0)
MCV: 81.7 fL (ref 80.0–100.0)
PLATELETS: 225 10*3/uL (ref 150–440)
RBC: 4.98 MIL/uL (ref 3.80–5.20)
RDW: 14.9 % — AB (ref 11.5–14.5)
WBC: 7.4 10*3/uL (ref 3.6–11.0)

## 2018-05-17 LAB — TROPONIN I
TROPONIN I: 0.38 ng/mL — AB (ref <0.03)
Troponin I: 0.35 ng/mL (ref <0.03)

## 2018-05-17 LAB — PHOSPHORUS: Phosphorus: 4.2 mg/dL (ref 2.5–4.6)

## 2018-05-17 LAB — MAGNESIUM: Magnesium: 1.9 mg/dL (ref 1.7–2.4)

## 2018-05-17 MED ORDER — SODIUM CHLORIDE 0.9 % IV SOLN
INTRAVENOUS | Status: DC
  Administered 2018-05-17 (×3): via INTRAVENOUS

## 2018-05-17 NOTE — Progress Notes (Signed)
Patient back from ultrasound. Patient sleeping comfortably, family at bedside. Will continue to monitor.

## 2018-05-17 NOTE — Progress Notes (Signed)
Patient up to BR to void x 1 since removal of foley catheter. Ambulates with standby assist to BR. Dr Jefferson Fuel discussed plan of care with patient and daughter. Transfer to 2A, Bernice updated.

## 2018-05-17 NOTE — Progress Notes (Signed)
Follow up - Critical Care Medicine Note  Patient Details:    Kelsey Terry is an 57 y.o. female. with a past medical history remarkable for hypertension, asthma, lymphedema, prior history of pericarditis, status post pericardiocentesis, presented to the emergency department with complaints of chest pain. Her EKG was consistent with anteroseptal ischemia, she was started on heparin and cardiology was consulted. 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 Status post drug-eluting stent to the proximal LAD. Post cardiac catheterization the patient had acute onset of altered mental status and confusion. Code stroke   Lines, Airways, Drains: Urethral Catheter Cath Lab (Active)  Indication for Insertion or Continuance of Catheter Peri-operative use for selective surgical procedure 05/17/2018  2:00 AM  Site Assessment Clean;Intact 05/17/2018  2:00 AM  Catheter Maintenance Bag below level of bladder;Catheter secured;Drainage bag/tubing not touching floor;No dependent loops;Seal intact 05/17/2018  2:00 AM  Collection Container Standard drainage bag 05/17/2018  2:00 AM  Securement Method Securing device (Describe) 05/17/2018  2:00 AM  Urinary Catheter Interventions Unclamped 05/17/2018  2:00 AM  Output (mL) 375 mL 05/17/2018  6:10 AM    Anti-infectives:  Anti-infectives (From admission, onward)   None      Microbiology: Results for orders placed or performed during the hospital encounter of 05/15/18  MRSA PCR Screening     Status: Abnormal   Collection Time: 05/16/18  3:46 PM  Result Value Ref Range Status   MRSA by PCR POSITIVE (A) NEGATIVE Final    Comment:        The GeneXpert MRSA Assay (FDA approved for NASAL specimens only), is one component of a comprehensive MRSA colonization surveillance program. It is not intended to diagnose MRSA infection nor to guide or monitor treatment for MRSA infections. RESULT  CALLED TO, READ BACK BY AND VERIFIED WITH: CHERYL PATEL 05/16/18 AT 1715 BY HS Performed at [facility], [place], [place] [address]     Events:   Studies: Ct Angio Head W Or Wo Contrast  Result Date: 05/16/2018 CLINICAL DATA:  Confusion following cardiac catheterization. EXAM: CT ANGIOGRAPHY HEAD AND NECK TECHNIQUE: Multidetector CT imaging of the head and neck was performed using the standard protocol during bolus administration of intravenous contrast. Multiplanar CT image reconstructions and MIPs were obtained to evaluate the vascular anatomy. Carotid stenosis measurements (when applicable) are obtained utilizing NASCET criteria, using the distal internal carotid diameter as the denominator. CONTRAST:  79mL OMNIPAQUE IOHEXOL 350 MG/ML SOLN COMPARISON:  Head CT 05/16/2018 FINDINGS: CT HEAD FINDINGS Brain: There is no mass, hemorrhage or extra-axial collection. The size and configuration of the ventricles and extra-axial CSF spaces are normal. Old right basal ganglia lacunar infarct. Brain parenchyma is otherwise normal. Skull: The visualized skull base, calvarium and extracranial soft tissues are normal. Sinuses/Orbits: Large area of calcification in the right maxillary sinus. The orbits are normal. CTA NECK FINDINGS AORTIC ARCH: There is no calcific atherosclerosis of the aortic arch. There is no aneurysm, dissection or hemodynamically significant stenosis of the visualized ascending aorta and aortic arch. Normal variant aortic arch branching pattern with the brachiocephalic and left common carotid arteries sharing a common origin. The visualized proximal subclavian arteries are widely patent. RIGHT CAROTID SYSTEM: --Common carotid artery: Widely patent origin without common carotid artery dissection or aneurysm. --Internal carotid artery: No dissection, occlusion or aneurysm. No hemodynamically significant stenosis. --External carotid artery: No acute abnormality. LEFT  CAROTID SYSTEM: --Common carotid artery: Widely patent  origin without common carotid artery dissection or aneurysm. --Internal carotid artery:No dissection, occlusion or aneurysm. No hemodynamically significant stenosis. --External carotid artery: No acute abnormality. VERTEBRAL ARTERIES: Right dominant configuration. Both origins are normal. No dissection, occlusion or flow-limiting stenosis to the vertebrobasilar confluence. SKELETON: There is no bony spinal canal stenosis. No lytic or blastic lesion. OTHER NECK: Normal pharynx, larynx and major salivary glands. No cervical lymphadenopathy. Unremarkable thyroid gland. UPPER CHEST: No pneumothorax or pleural effusion. No nodules or masses. CTA HEAD FINDINGS ANTERIOR CIRCULATION: --Intracranial internal carotid arteries: Atherosclerotic calcification of the internal carotid arteries at the skull base without hemodynamically significant stenosis. --Anterior cerebral arteries: Normal. Absent right A1 segment, normal variant. Patent anterior communicating artery. --Middle cerebral arteries: Normal. --Posterior communicating arteries: Present on the right, absent on the left. POSTERIOR CIRCULATION: --Basilar artery: Normal. --Posterior cerebral arteries: Normal. --Superior cerebellar arteries: Normal. --Inferior cerebellar arteries: Normal anterior and posterior inferior cerebellar arteries. VENOUS SINUSES: As permitted by contrast timing, patent. ANATOMIC VARIANTS: Absent right A1 segment. DELAYED PHASE: No parenchymal contrast enhancement. Review of the MIP images confirms the above findings. IMPRESSION: 1. No emergent large vessel occlusion or hemodynamically significant stenosis. 2. Old right basal ganglia lacunar infarct without acute intracranial abnormality. 3. Mild aortic atherosclerosis (ICD10-I70.0). Electronically Signed   By: Ulyses Jarred M.D.   On: 05/16/2018 22:11   Dg Chest 2 View  Result Date: 05/15/2018 CLINICAL DATA:  Chest pain EXAM: CHEST - 2  VIEW COMPARISON:  None. FINDINGS: Diffuse interstitial coarsening. No focal airspace consolidation. No pleural effusion or pneumothorax. Normal cardiomediastinal contours. IMPRESSION: Possible chronic bronchitis.  No pneumonia or pulmonary edema. Electronically Signed   By: Ulyses Jarred M.D.   On: 05/15/2018 22:21   Ct Angio Neck W Or Wo Contrast  Result Date: 05/16/2018 CLINICAL DATA:  Confusion following cardiac catheterization. EXAM: CT ANGIOGRAPHY HEAD AND NECK TECHNIQUE: Multidetector CT imaging of the head and neck was performed using the standard protocol during bolus administration of intravenous contrast. Multiplanar CT image reconstructions and MIPs were obtained to evaluate the vascular anatomy. Carotid stenosis measurements (when applicable) are obtained utilizing NASCET criteria, using the distal internal carotid diameter as the denominator. CONTRAST:  11mL OMNIPAQUE IOHEXOL 350 MG/ML SOLN COMPARISON:  Head CT 05/16/2018 FINDINGS: CT HEAD FINDINGS Brain: There is no mass, hemorrhage or extra-axial collection. The size and configuration of the ventricles and extra-axial CSF spaces are normal. Old right basal ganglia lacunar infarct. Brain parenchyma is otherwise normal. Skull: The visualized skull base, calvarium and extracranial soft tissues are normal. Sinuses/Orbits: Large area of calcification in the right maxillary sinus. The orbits are normal. CTA NECK FINDINGS AORTIC ARCH: There is no calcific atherosclerosis of the aortic arch. There is no aneurysm, dissection or hemodynamically significant stenosis of the visualized ascending aorta and aortic arch. Normal variant aortic arch branching pattern with the brachiocephalic and left common carotid arteries sharing a common origin. The visualized proximal subclavian arteries are widely patent. RIGHT CAROTID SYSTEM: --Common carotid artery: Widely patent origin without common carotid artery dissection or aneurysm. --Internal carotid artery: No  dissection, occlusion or aneurysm. No hemodynamically significant stenosis. --External carotid artery: No acute abnormality. LEFT CAROTID SYSTEM: --Common carotid artery: Widely patent origin without common carotid artery dissection or aneurysm. --Internal carotid artery:No dissection, occlusion or aneurysm. No hemodynamically significant stenosis. --External carotid artery: No acute abnormality. VERTEBRAL ARTERIES: Right dominant configuration. Both origins are normal. No dissection, occlusion or flow-limiting stenosis to the vertebrobasilar confluence. SKELETON: There is no bony spinal canal stenosis.  No lytic or blastic lesion. OTHER NECK: Normal pharynx, larynx and major salivary glands. No cervical lymphadenopathy. Unremarkable thyroid gland. UPPER CHEST: No pneumothorax or pleural effusion. No nodules or masses. CTA HEAD FINDINGS ANTERIOR CIRCULATION: --Intracranial internal carotid arteries: Atherosclerotic calcification of the internal carotid arteries at the skull base without hemodynamically significant stenosis. --Anterior cerebral arteries: Normal. Absent right A1 segment, normal variant. Patent anterior communicating artery. --Middle cerebral arteries: Normal. --Posterior communicating arteries: Present on the right, absent on the left. POSTERIOR CIRCULATION: --Basilar artery: Normal. --Posterior cerebral arteries: Normal. --Superior cerebellar arteries: Normal. --Inferior cerebellar arteries: Normal anterior and posterior inferior cerebellar arteries. VENOUS SINUSES: As permitted by contrast timing, patent. ANATOMIC VARIANTS: Absent right A1 segment. DELAYED PHASE: No parenchymal contrast enhancement. Review of the MIP images confirms the above findings. IMPRESSION: 1. No emergent large vessel occlusion or hemodynamically significant stenosis. 2. Old right basal ganglia lacunar infarct without acute intracranial abnormality. 3. Mild aortic atherosclerosis (ICD10-I70.0). Electronically Signed   By:  Ulyses Jarred M.D.   On: 05/16/2018 22:11   Ct Head Code Stroke Wo Contrast`  Result Date: 05/16/2018 CLINICAL DATA:  Code stroke. Confusion, acute, unexplained. Cardiac catheterization this morning. EXAM: CT HEAD WITHOUT CONTRAST TECHNIQUE: Contiguous axial images were obtained from the base of the skull through the vertex without intravenous contrast. COMPARISON:  None. FINDINGS: Brain: No evidence of acute infarction, hemorrhage, hydrocephalus, extra-axial collection or mass lesion/mass effect. Well-defined low-density curvilinear infarct in the right basal ganglia and corona radiata, lateral lenticulostriate distribution. This has a chronic appearance. Vascular: High-density vessels in the setting of recent cardiac catheterization. Atherosclerotic calcification Skull: Negative Sinuses/Orbits: Negative Other: These results were called by telephone at the time of interpretation on 05/16/2018 at 1:38 pm to Dr. Vaughan Basta , who verbally acknowledged these results. ASPECTS [facility] Stroke Program Early CT Score) Not graded with this history IMPRESSION: 1. No acute finding. 2. 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 Electronically Signed   By: Monte Fantasia M.D.   On: 05/16/2018 13:39    Consults: Treatment Team:  Wellington Hampshire, MD Catarina Hartshorn, MD   Subjective:    Overnight Issues: overnight patient was weaned from Precedex, with resolution of confusion and normal mental status. CT angiograms were performed without any clear large vessel occlusion or acute stroke. Patient is morning is resting comfortably in no acute distress  Objective:  Vital signs for last 24 hours: Temp:  [98.9 F (37.2 C)] 98.9 F (37.2 C) (06/29 0200) Pulse Rate:  [62-91] 63 (06/29 0500) Resp:  [12-23] 15 (06/29 0500) BP: (111-195)/(51-174) 141/63 (06/29 0500) SpO2:  [95 %-100 %] 100 % (06/29 0500) Weight:  [196 lb 3.4 oz (89 kg)] 196 lb 3.4 oz (89 kg) (06/29  0430)  Hemodynamic parameters for last 24 hours:    Intake/Output from previous day: 06/28 0701 - 06/29 0700 In: 704.5 [P.O.:360; I.V.:344.5] Out: 1625 [Urine:1625]  Intake/Output this shift: No intake/output data recorded.  Vent settings for last 24 hours:    Physical Exam:  Patient is awake, alert, oriented in no acute distress Vital signs:       Please see the above listed vital signs HEENT:           No obvious cranial nerve abnormalities, trachea midline, no accessory muscle utilization noted Cardiovascular:           Regular rate and rhythm Pulmonary:      Clear to auscultation Abdominal:      Positive bowel sounds, soft exam Extremity:  2+ edema noted Neurologic:      Patient moves all extremities but limited exam Cutaneous:      No rashes or lesions noted    Assessment/Plan:    Status post cardiac catheterization with dual vessel disease, status post LAD PCI/DES. Post procedure patient developed acute onset of confusion concerning for embolic stroke. CT scan of the head without contrast negative.CT angiogram of neck and head   IMPRESSION: 1. No emergent large vessel occlusion or hemodynamically significant stenosis. 2. Old right basal ganglia lacunar infarct without acute intracranial abnormality. 3. Mild aortic atherosclerosis (ICD10-I70.0).  At this time patient is without deficit, normal mentation may have been secondary to medication. Pending neurology's input regarding scans last night. Patient may be transferred to cart telemetry bed   Kendarrius Tanzi 05/17/2018  *Care during the described time interval was provided by me and/or other providers on the critical care team.  I have reviewed this patient's available data, including medical history, events of note, physical examination and test results as part of my evaluation.

## 2018-05-17 NOTE — Progress Notes (Signed)
Patient complaining of headache, prn tylenol given with hold washcloth over her head. Patient asleep at time of re-assessment.   When transport arrived to take patient to ultrasound, patient started vomiting. PRN Zofran given to patient. No complaints of chest pain. Will continue to monitor patient.

## 2018-05-17 NOTE — Progress Notes (Signed)
Subjective:  Denies SSCP, palpitations or Dyspnea Had confusion over night now cleared  Precedex off   Objective:  Vitals:   05/17/18 0500 05/17/18 0800 05/17/18 0900 05/17/18 1000  BP: (!) 141/63  (!) 143/67 139/64  Pulse: 63 (!) 53 67 (!) 57  Resp: 15     Temp:  98.1 F (36.7 C)    TempSrc:  Oral    SpO2: 100% 100% 98% 100%  Weight:      Height:        Intake/Output from previous day:  Intake/Output Summary (Last 24 hours) at 05/17/2018 1034 Last data filed at 05/17/2018 0900 Gross per 24 hour  Intake 1406.52 ml  Output 1775 ml  Net -368.48 ml    Physical Exam: Affect appropriate Overweight black female  HEENT: normal Neck supple with no adenopathy JVP normal no bruits no thyromegaly Lungs clear with no wheezing and good diaphragmatic motion Heart:  S1/S2 no murmur, no rub, gallop or click PMI normal Abdomen: benighn, BS positve, no tenderness, no AAA no bruit.  No HSM or HJR Distal pulses intact with no bruits No edema Neuro non-focal Skin warm and dry No muscular weakness Right radial cath sight A   Lab Results: Basic Metabolic Panel: Recent Labs    05/15/18 2202 05/16/18 0656 05/17/18 0449  NA 138  --  137  K 4.0  --  3.0*  CL 103  --  104  CO2 27  --  24  GLUCOSE 87  --  95  BUN 12  --  11  CREATININE 0.89  --  0.80  CALCIUM 9.1  --  8.5*  MG  --  2.6* 1.9  PHOS  --   --  4.2   Liver Function Tests: No results for input(s): AST, ALT, ALKPHOS, BILITOT, PROT, ALBUMIN in the last 72 hours. No results for input(s): LIPASE, AMYLASE in the last 72 hours. CBC: Recent Labs    05/16/18 0659 05/17/18 0449  WBC 6.4 7.4  HGB 14.0 13.3  HCT 42.6 40.7  MCV 82.0 81.7  PLT 248 225   Cardiac Enzymes: Recent Labs    05/16/18 1623 05/16/18 2305 05/17/18 0449  TROPONINI 0.18* 0.30* 0.38*   BNP: Invalid input(s): POCBNP D-Dimer: No results for input(s): DDIMER in the last 72 hours. Hemoglobin A1C: Recent Labs    05/16/18 0228  HGBA1C  5.7*   Fasting Lipid Panel: Recent Labs    05/16/18 0009  CHOL 232*  HDL 55  LDLCALC 168*  TRIG 43  CHOLHDL 4.2   Thyroid Function Tests: Recent Labs    05/16/18 0228  TSH 1.115   Anemia Panel: No results for input(s): VITAMINB12, FOLATE, FERRITIN, TIBC, IRON, RETICCTPCT in the last 72 hours.  Imaging: Ct Angio Head W Or Wo Contrast  Result Date: 05/16/2018 CLINICAL DATA:  Confusion following cardiac catheterization. EXAM: CT ANGIOGRAPHY HEAD AND NECK TECHNIQUE: Multidetector CT imaging of the head and neck was performed using the standard protocol during bolus administration of intravenous contrast. Multiplanar CT image reconstructions and MIPs were obtained to evaluate the vascular anatomy. Carotid stenosis measurements (when applicable) are obtained utilizing NASCET criteria, using the distal internal carotid diameter as the denominator. CONTRAST:  83mL OMNIPAQUE IOHEXOL 350 MG/ML SOLN COMPARISON:  Head CT 05/16/2018 FINDINGS: CT HEAD FINDINGS Brain: There is no mass, hemorrhage or extra-axial collection. The size and configuration of the ventricles and extra-axial CSF spaces are normal. Old right basal ganglia lacunar infarct. Brain parenchyma is otherwise normal. Skull:  The visualized skull base, calvarium and extracranial soft tissues are normal. Sinuses/Orbits: Large area of calcification in the right maxillary sinus. The orbits are normal. CTA NECK FINDINGS AORTIC ARCH: There is no calcific atherosclerosis of the aortic arch. There is no aneurysm, dissection or hemodynamically significant stenosis of the visualized ascending aorta and aortic arch. Normal variant aortic arch branching pattern with the brachiocephalic and left common carotid arteries sharing a common origin. The visualized proximal subclavian arteries are widely patent. RIGHT CAROTID SYSTEM: --Common carotid artery: Widely patent origin without common carotid artery dissection or aneurysm. --Internal carotid artery: No  dissection, occlusion or aneurysm. No hemodynamically significant stenosis. --External carotid artery: No acute abnormality. LEFT CAROTID SYSTEM: --Common carotid artery: Widely patent origin without common carotid artery dissection or aneurysm. --Internal carotid artery:No dissection, occlusion or aneurysm. No hemodynamically significant stenosis. --External carotid artery: No acute abnormality. VERTEBRAL ARTERIES: Right dominant configuration. Both origins are normal. No dissection, occlusion or flow-limiting stenosis to the vertebrobasilar confluence. SKELETON: There is no bony spinal canal stenosis. No lytic or blastic lesion. OTHER NECK: Normal pharynx, larynx and major salivary glands. No cervical lymphadenopathy. Unremarkable thyroid gland. UPPER CHEST: No pneumothorax or pleural effusion. No nodules or masses. CTA HEAD FINDINGS ANTERIOR CIRCULATION: --Intracranial internal carotid arteries: Atherosclerotic calcification of the internal carotid arteries at the skull base without hemodynamically significant stenosis. --Anterior cerebral arteries: Normal. Absent right A1 segment, normal variant. Patent anterior communicating artery. --Middle cerebral arteries: Normal. --Posterior communicating arteries: Present on the right, absent on the left. POSTERIOR CIRCULATION: --Basilar artery: Normal. --Posterior cerebral arteries: Normal. --Superior cerebellar arteries: Normal. --Inferior cerebellar arteries: Normal anterior and posterior inferior cerebellar arteries. VENOUS SINUSES: As permitted by contrast timing, patent. ANATOMIC VARIANTS: Absent right A1 segment. DELAYED PHASE: No parenchymal contrast enhancement. Review of the MIP images confirms the above findings. IMPRESSION: 1. No emergent large vessel occlusion or hemodynamically significant stenosis. 2. Old right basal ganglia lacunar infarct without acute intracranial abnormality. 3. Mild aortic atherosclerosis (ICD10-I70.0). Electronically Signed   By:  Ulyses Jarred M.D.   On: 05/16/2018 22:11   Dg Chest 2 View  Result Date: 05/15/2018 CLINICAL DATA:  Chest pain EXAM: CHEST - 2 VIEW COMPARISON:  None. FINDINGS: Diffuse interstitial coarsening. No focal airspace consolidation. No pleural effusion or pneumothorax. Normal cardiomediastinal contours. IMPRESSION: Possible chronic bronchitis.  No pneumonia or pulmonary edema. Electronically Signed   By: Ulyses Jarred M.D.   On: 05/15/2018 22:21   Ct Angio Neck W Or Wo Contrast  Result Date: 05/16/2018 CLINICAL DATA:  Confusion following cardiac catheterization. EXAM: CT ANGIOGRAPHY HEAD AND NECK TECHNIQUE: Multidetector CT imaging of the head and neck was performed using the standard protocol during bolus administration of intravenous contrast. Multiplanar CT image reconstructions and MIPs were obtained to evaluate the vascular anatomy. Carotid stenosis measurements (when applicable) are obtained utilizing NASCET criteria, using the distal internal carotid diameter as the denominator. CONTRAST:  49mL OMNIPAQUE IOHEXOL 350 MG/ML SOLN COMPARISON:  Head CT 05/16/2018 FINDINGS: CT HEAD FINDINGS Brain: There is no mass, hemorrhage or extra-axial collection. The size and configuration of the ventricles and extra-axial CSF spaces are normal. Old right basal ganglia lacunar infarct. Brain parenchyma is otherwise normal. Skull: The visualized skull base, calvarium and extracranial soft tissues are normal. Sinuses/Orbits: Large area of calcification in the right maxillary sinus. The orbits are normal. CTA NECK FINDINGS AORTIC ARCH: There is no calcific atherosclerosis of the aortic arch. There is no aneurysm, dissection or hemodynamically significant stenosis of the visualized ascending  aorta and aortic arch. Normal variant aortic arch branching pattern with the brachiocephalic and left common carotid arteries sharing a common origin. The visualized proximal subclavian arteries are widely patent. RIGHT CAROTID SYSTEM:  --Common carotid artery: Widely patent origin without common carotid artery dissection or aneurysm. --Internal carotid artery: No dissection, occlusion or aneurysm. No hemodynamically significant stenosis. --External carotid artery: No acute abnormality. LEFT CAROTID SYSTEM: --Common carotid artery: Widely patent origin without common carotid artery dissection or aneurysm. --Internal carotid artery:No dissection, occlusion or aneurysm. No hemodynamically significant stenosis. --External carotid artery: No acute abnormality. VERTEBRAL ARTERIES: Right dominant configuration. Both origins are normal. No dissection, occlusion or flow-limiting stenosis to the vertebrobasilar confluence. SKELETON: There is no bony spinal canal stenosis. No lytic or blastic lesion. OTHER NECK: Normal pharynx, larynx and major salivary glands. No cervical lymphadenopathy. Unremarkable thyroid gland. UPPER CHEST: No pneumothorax or pleural effusion. No nodules or masses. CTA HEAD FINDINGS ANTERIOR CIRCULATION: --Intracranial internal carotid arteries: Atherosclerotic calcification of the internal carotid arteries at the skull base without hemodynamically significant stenosis. --Anterior cerebral arteries: Normal. Absent right A1 segment, normal variant. Patent anterior communicating artery. --Middle cerebral arteries: Normal. --Posterior communicating arteries: Present on the right, absent on the left. POSTERIOR CIRCULATION: --Basilar artery: Normal. --Posterior cerebral arteries: Normal. --Superior cerebellar arteries: Normal. --Inferior cerebellar arteries: Normal anterior and posterior inferior cerebellar arteries. VENOUS SINUSES: As permitted by contrast timing, patent. ANATOMIC VARIANTS: Absent right A1 segment. DELAYED PHASE: No parenchymal contrast enhancement. Review of the MIP images confirms the above findings. IMPRESSION: 1. No emergent large vessel occlusion or hemodynamically significant stenosis. 2. Old right basal ganglia  lacunar infarct without acute intracranial abnormality. 3. Mild aortic atherosclerosis (ICD10-I70.0). Electronically Signed   By: Ulyses Jarred M.D.   On: 05/16/2018 22:11   Ct Head Code Stroke Wo Contrast`  Result Date: 05/16/2018 CLINICAL DATA:  Code stroke. Confusion, acute, unexplained. Cardiac catheterization this morning. EXAM: CT HEAD WITHOUT CONTRAST TECHNIQUE: Contiguous axial images were obtained from the base of the skull through the vertex without intravenous contrast. COMPARISON:  None. FINDINGS: Brain: No evidence of acute infarction, hemorrhage, hydrocephalus, extra-axial collection or mass lesion/mass effect. Well-defined low-density curvilinear infarct in the right basal ganglia and corona radiata, lateral lenticulostriate distribution. This has a chronic appearance. Vascular: High-density vessels in the setting of recent cardiac catheterization. Atherosclerotic calcification Skull: Negative Sinuses/Orbits: Negative Other: These results were called by telephone at the time of interpretation on 05/16/2018 at 1:38 pm to Dr. Vaughan Basta , who verbally acknowledged these results. ASPECTS [facility] Stroke Program Early CT Score) Not graded with this history IMPRESSION: 1. No acute finding. 2. 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 Electronically Signed   By: Monte Fantasia M.D.   On: 05/16/2018 13:39    Cardiac Studies:  ECG: SR deep biphasic T wave changes anterolateral    Telemetry: NSR no VT 05/17/2018   Echo: EF 55-60%   Medications:   . aspirin EC  81 mg Oral Daily  . atorvastatin  80 mg Oral q1800  . carvedilol  6.25 mg Oral BID WC  . docusate sodium  100 mg Oral BID  . enoxaparin (LOVENOX) injection  40 mg Subcutaneous Q24H  . losartan  100 mg Oral Daily   And  . hydrochlorothiazide  25 mg Oral Daily  . mupirocin ointment  1 application Nasal BID  . potassium chloride SA  20 mEq Oral Daily  . sodium chloride flush  3 mL Intravenous Q12H  .  ticagrelor  90 mg Oral BID     . sodium chloride    . sodium chloride 75 mL/hr at 05/17/18 0900  . dexmedetomidine (PRECEDEX) IV infusion Stopped (05/16/18 1830)    Assessment/Plan:   CAD:  Post difficult intervention to proximal and mid LAD 05/16/18 with DES stent no chest pain Minor troponin elevation DAT with ticagrelor continue beta blocker for staged procedure form RFA With Dr Fletcher Anon to RCA on Monday   MS:  Back to baseline CT/CTA head normal Precedex off No evidence of peri procedural stroke  HLD:  Continue statin  HTN:  Well controlled.  Continue current medications and low sodium Dash type diet.   Continue losartan  Jenkins Rouge 05/17/2018, 10:34 AM

## 2018-05-17 NOTE — Progress Notes (Signed)
[facility] [place] NAME: Kelsey Terry    MR#:  [id number]  DATE OF BIRTH:  1961/05/06  SUBJECTIVE:   Patient here due to chest pain and noted to have a non-ST elevation MI.  Status post cardiac catheterization 2 days ago with stent to the proximal LAD.  Patient also had a RCA stenosis but given high risk stenting 2 days ago plan for repeat cardiac catheterization and stent placement this Monday.  Patient is clinically asymptomatic and denies any chest pain.  Transfer out of the ICU this morning.  REVIEW OF SYSTEMS:    Review of Systems  Constitutional: Negative for chills and fever.  HENT: Negative for congestion and tinnitus.   Eyes: Negative for blurred vision and double vision.  Respiratory: Negative for cough, shortness of breath and wheezing.   Cardiovascular: Negative for chest pain, orthopnea and PND.  Gastrointestinal: Negative for abdominal pain, diarrhea, nausea and vomiting.  Genitourinary: Negative for dysuria and hematuria.  Neurological: Negative for dizziness, sensory change and focal weakness.  All other systems reviewed and are negative.   Nutrition: Heart Healthy Tolerating Diet: yes Tolerating PT: Await Eval.  DRUG ALLERGIES:   Allergies  Allergen Reactions  . Amoxicillin Itching and Other (See Comments)    Reaction: bump in vaginal region Has patient had a PCN reaction causing immediate rash, facial/tongue/throat swelling, SOB or lightheadedness with hypotension: No Has patient had a PCN reaction causing severe rash involving mucus membranes or skin necrosis: No Has patient had a PCN reaction that required hospitalization: No Has patient had a PCN reaction occurring within the last 10 years: No If all of the above answers are "NO", then may proceed with Cephalosporin use.     VITALS:  Blood pressure 135/86, pulse 62, temperature 98.7 F (37.1 C), temperature source Oral, resp. rate 18, height 5\' 3"  (1.6 m),  weight 89.6 kg (197 lb 8 oz), SpO2 98 %.  PHYSICAL EXAMINATION:   Physical Exam  GENERAL:  57 y.o.-year-old patient lying in bed in no acute distress.  EYES: Pupils equal, round, reactive to light and accommodation. No scleral icterus. Extraocular muscles intact.  HEENT: Head atraumatic, normocephalic. Oropharynx and nasopharynx clear.  NECK:  Supple, no jugular venous distention. No thyroid enlargement, no tenderness.  LUNGS: Normal breath sounds bilaterally, no wheezing, rales, rhonchi. No use of accessory muscles of respiration.  CARDIOVASCULAR: S1, S2 normal. No murmurs, rubs, or gallops.  ABDOMEN: Soft, nontender, nondistended. Bowel sounds present. No organomegaly or mass.  EXTREMITIES: No cyanosis, clubbing or edema b/l.    NEUROLOGIC: Cranial nerves II through XII are intact. No focal Motor or sensory deficits b/l.   PSYCHIATRIC: The patient is alert and oriented x 3.  SKIN: No obvious rash, lesion, or ulcer.    LABORATORY PANEL:   CBC Recent Labs  Lab 05/17/18 0449  WBC 7.4  HGB 13.3  HCT 40.7  PLT 225   ------------------------------------------------------------------------------------------------------------------  Chemistries  Recent Labs  Lab 05/17/18 0449  NA 137  K 3.0*  CL 104  CO2 24  GLUCOSE 95  BUN 11  CREATININE 0.80  CALCIUM 8.5*  MG 1.9   ------------------------------------------------------------------------------------------------------------------  Cardiac Enzymes Recent Labs  Lab 05/17/18 1006  TROPONINI 0.35*   ------------------------------------------------------------------------------------------------------------------  RADIOLOGY:  Ct Angio Head W Or Wo Contrast  Result Date: 05/16/2018 CLINICAL DATA:  Confusion following cardiac catheterization. EXAM: CT ANGIOGRAPHY HEAD AND NECK TECHNIQUE: Multidetector CT imaging of the head and neck was performed using the  standard protocol during bolus administration of intravenous  contrast. Multiplanar CT image reconstructions and MIPs were obtained to evaluate the vascular anatomy. Carotid stenosis measurements (when applicable) are obtained utilizing NASCET criteria, using the distal internal carotid diameter as the denominator. CONTRAST:  21mL OMNIPAQUE IOHEXOL 350 MG/ML SOLN COMPARISON:  Head CT 05/16/2018 FINDINGS: CT HEAD FINDINGS Brain: There is no mass, hemorrhage or extra-axial collection. The size and configuration of the ventricles and extra-axial CSF spaces are normal. Old right basal ganglia lacunar infarct. Brain parenchyma is otherwise normal. Skull: The visualized skull base, calvarium and extracranial soft tissues are normal. Sinuses/Orbits: Large area of calcification in the right maxillary sinus. The orbits are normal. CTA NECK FINDINGS AORTIC ARCH: There is no calcific atherosclerosis of the aortic arch. There is no aneurysm, dissection or hemodynamically significant stenosis of the visualized ascending aorta and aortic arch. Normal variant aortic arch branching pattern with the brachiocephalic and left common carotid arteries sharing a common origin. The visualized proximal subclavian arteries are widely patent. RIGHT CAROTID SYSTEM: --Common carotid artery: Widely patent origin without common carotid artery dissection or aneurysm. --Internal carotid artery: No dissection, occlusion or aneurysm. No hemodynamically significant stenosis. --External carotid artery: No acute abnormality. LEFT CAROTID SYSTEM: --Common carotid artery: Widely patent origin without common carotid artery dissection or aneurysm. --Internal carotid artery:No dissection, occlusion or aneurysm. No hemodynamically significant stenosis. --External carotid artery: No acute abnormality. VERTEBRAL ARTERIES: Right dominant configuration. Both origins are normal. No dissection, occlusion or flow-limiting stenosis to the vertebrobasilar confluence. SKELETON: There is no bony spinal canal stenosis. No lytic or  blastic lesion. OTHER NECK: Normal pharynx, larynx and major salivary glands. No cervical lymphadenopathy. Unremarkable thyroid gland. UPPER CHEST: No pneumothorax or pleural effusion. No nodules or masses. CTA HEAD FINDINGS ANTERIOR CIRCULATION: --Intracranial internal carotid arteries: Atherosclerotic calcification of the internal carotid arteries at the skull base without hemodynamically significant stenosis. --Anterior cerebral arteries: Normal. Absent right A1 segment, normal variant. Patent anterior communicating artery. --Middle cerebral arteries: Normal. --Posterior communicating arteries: Present on the right, absent on the left. POSTERIOR CIRCULATION: --Basilar artery: Normal. --Posterior cerebral arteries: Normal. --Superior cerebellar arteries: Normal. --Inferior cerebellar arteries: Normal anterior and posterior inferior cerebellar arteries. VENOUS SINUSES: As permitted by contrast timing, patent. ANATOMIC VARIANTS: Absent right A1 segment. DELAYED PHASE: No parenchymal contrast enhancement. Review of the MIP images confirms the above findings. IMPRESSION: 1. No emergent large vessel occlusion or hemodynamically significant stenosis. 2. Old right basal ganglia lacunar infarct without acute intracranial abnormality. 3. Mild aortic atherosclerosis (ICD10-I70.0). Electronically Signed   By: Ulyses Jarred M.D.   On: 05/16/2018 22:11   Dg Chest 2 View  Result Date: 05/15/2018 CLINICAL DATA:  Chest pain EXAM: CHEST - 2 VIEW COMPARISON:  None. FINDINGS: Diffuse interstitial coarsening. No focal airspace consolidation. No pleural effusion or pneumothorax. Normal cardiomediastinal contours. IMPRESSION: Possible chronic bronchitis.  No pneumonia or pulmonary edema. Electronically Signed   By: Ulyses Jarred M.D.   On: 05/15/2018 22:21   Ct Angio Neck W Or Wo Contrast  Result Date: 05/16/2018 CLINICAL DATA:  Confusion following cardiac catheterization. EXAM: CT ANGIOGRAPHY HEAD AND NECK TECHNIQUE:  Multidetector CT imaging of the head and neck was performed using the standard protocol during bolus administration of intravenous contrast. Multiplanar CT image reconstructions and MIPs were obtained to evaluate the vascular anatomy. Carotid stenosis measurements (when applicable) are obtained utilizing NASCET criteria, using the distal internal carotid diameter as the denominator. CONTRAST:  41mL OMNIPAQUE IOHEXOL 350 MG/ML SOLN COMPARISON:  Head  CT 05/16/2018 FINDINGS: CT HEAD FINDINGS Brain: There is no mass, hemorrhage or extra-axial collection. The size and configuration of the ventricles and extra-axial CSF spaces are normal. Old right basal ganglia lacunar infarct. Brain parenchyma is otherwise normal. Skull: The visualized skull base, calvarium and extracranial soft tissues are normal. Sinuses/Orbits: Large area of calcification in the right maxillary sinus. The orbits are normal. CTA NECK FINDINGS AORTIC ARCH: There is no calcific atherosclerosis of the aortic arch. There is no aneurysm, dissection or hemodynamically significant stenosis of the visualized ascending aorta and aortic arch. Normal variant aortic arch branching pattern with the brachiocephalic and left common carotid arteries sharing a common origin. The visualized proximal subclavian arteries are widely patent. RIGHT CAROTID SYSTEM: --Common carotid artery: Widely patent origin without common carotid artery dissection or aneurysm. --Internal carotid artery: No dissection, occlusion or aneurysm. No hemodynamically significant stenosis. --External carotid artery: No acute abnormality. LEFT CAROTID SYSTEM: --Common carotid artery: Widely patent origin without common carotid artery dissection or aneurysm. --Internal carotid artery:No dissection, occlusion or aneurysm. No hemodynamically significant stenosis. --External carotid artery: No acute abnormality. VERTEBRAL ARTERIES: Right dominant configuration. Both origins are normal. No dissection,  occlusion or flow-limiting stenosis to the vertebrobasilar confluence. SKELETON: There is no bony spinal canal stenosis. No lytic or blastic lesion. OTHER NECK: Normal pharynx, larynx and major salivary glands. No cervical lymphadenopathy. Unremarkable thyroid gland. UPPER CHEST: No pneumothorax or pleural effusion. No nodules or masses. CTA HEAD FINDINGS ANTERIOR CIRCULATION: --Intracranial internal carotid arteries: Atherosclerotic calcification of the internal carotid arteries at the skull base without hemodynamically significant stenosis. --Anterior cerebral arteries: Normal. Absent right A1 segment, normal variant. Patent anterior communicating artery. --Middle cerebral arteries: Normal. --Posterior communicating arteries: Present on the right, absent on the left. POSTERIOR CIRCULATION: --Basilar artery: Normal. --Posterior cerebral arteries: Normal. --Superior cerebellar arteries: Normal. --Inferior cerebellar arteries: Normal anterior and posterior inferior cerebellar arteries. VENOUS SINUSES: As permitted by contrast timing, patent. ANATOMIC VARIANTS: Absent right A1 segment. DELAYED PHASE: No parenchymal contrast enhancement. Review of the MIP images confirms the above findings. IMPRESSION: 1. No emergent large vessel occlusion or hemodynamically significant stenosis. 2. Old right basal ganglia lacunar infarct without acute intracranial abnormality. 3. Mild aortic atherosclerosis (ICD10-I70.0). Electronically Signed   By: Ulyses Jarred M.D.   On: 05/16/2018 22:11   Ct Head Code Stroke Wo Contrast`  Result Date: 05/16/2018 CLINICAL DATA:  Code stroke. Confusion, acute, unexplained. Cardiac catheterization this morning. EXAM: CT HEAD WITHOUT CONTRAST TECHNIQUE: Contiguous axial images were obtained from the base of the skull through the vertex without intravenous contrast. COMPARISON:  None. FINDINGS: Brain: No evidence of acute infarction, hemorrhage, hydrocephalus, extra-axial collection or mass  lesion/mass effect. Well-defined low-density curvilinear infarct in the right basal ganglia and corona radiata, lateral lenticulostriate distribution. This has a chronic appearance. Vascular: High-density vessels in the setting of recent cardiac catheterization. Atherosclerotic calcification Skull: Negative Sinuses/Orbits: Negative Other: These results were called by telephone at the time of interpretation on 05/16/2018 at 1:38 pm to Dr. Vaughan Basta , who verbally acknowledged these results. ASPECTS [facility] Stroke Program Early CT Score) Not graded with this history IMPRESSION: 1. No acute finding. 2. 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 Electronically Signed   By: Monte Fantasia M.D.   On: 05/16/2018 13:39     ASSESSMENT AND PLAN:   57 year old female with past medical history of hypertension, asthma who presented to the hospital due to chest pain and noted to have a non-ST elevation MI.  1.  Non-ST elevation MI- patient is status post cardiac catheterization with stent to the proximal LAD.  Patient also was also noted to have a RCA lesion.  She needs a staged PCI for this which is likely to happen this Monday. -Continue aspirin, Brilinta, atorvastatin, carvedilol. -Currently chest pain-free and hemodynamically stable.  2.  Essential hypertension-continue losartan, HCTZ, carvedilol.  3.  Altered mental status-patient developed this post cardiac catheterization and code stroke was activated.  Patient underwent extensive neurologic work-up including CT of the head, CT angiogram of the head which was negative for acute pathology.  4.  Hyperlipidemia-continue atorvastatin.  5.  Asthma-no acute exacerbation.   All the records are reviewed and case discussed with Care Management/Social Worker. Management plans discussed with the patient, family and they are in agreement.  CODE STATUS: Full code  DVT Prophylaxis: Lovenox  TOTAL TIME TAKING CARE OF THIS  PATIENT: 30 minutes.   POSSIBLE D/C IN 2-3 DAYS, DEPENDING ON CLINICAL CONDITION.   Henreitta Leber M.D on 05/17/2018 at 1:19 PM  Between 7am to 6pm - Pager - [phone]  After 6pm go to [email or web address] - Proofreader  [facility]  Office  [phone]  CC: Primary care physician; System, Pcp Not In

## 2018-05-17 NOTE — Progress Notes (Signed)
Patient is alert and oriented today, although does not recall confusion of previous day. Reporting "very slight" headache this morning, PRN given at 0600. Foley removed at 0830 with no void at this time. Daughter at bedside, supportive of patient. Patient tolerated breakfast with no complaints of nausea/vomiting. To transfer to 2A today.

## 2018-05-17 NOTE — Plan of Care (Signed)
Patient alert and oriented.  Washed up and brushed her teeth this morning.  Able to tolerate medications.  PRN tylenol given for headache.  Continues to c/o headache upon movement.  Right wrist, level 0, no bleeding noted.  CTA and CT scan completed.  Will continue to monitor.

## 2018-05-17 NOTE — Progress Notes (Signed)
Patient transferred from ICU. Patient A&Ox4, requesting to shower before settling in. Vital signs taken, no pain at this time. Daughter at bedside. Nurse tech to aid with shower. Will continue to monitor patient.

## 2018-05-18 NOTE — Plan of Care (Signed)
  Problem: Health Behavior/Discharge Planning: Goal: Ability to manage health-related needs will improve Outcome: Progressing   Problem: Pain Managment: Goal: General experience of comfort will improve Outcome: Progressing   Problem: Activity: Goal: Ability to tolerate increased activity will improve Outcome: Progressing   Problem: Cardiovascular: Goal: Vascular access site(s) Level 0-1 will be maintained Outcome: Progressing

## 2018-05-18 NOTE — Plan of Care (Signed)
  Problem: Health Behavior/Discharge Planning: Goal: Ability to manage health-related needs will improve Outcome: Progressing   Problem: Clinical Measurements: Goal: Ability to maintain clinical measurements within normal limits will improve Outcome: Progressing Goal: Will remain free from infection Outcome: Progressing   

## 2018-05-18 NOTE — Progress Notes (Signed)
[facility] NAME: Pari Lombard    MR#:  [id number]  DATE OF BIRTH:  [birth year]  SUBJECTIVE:   Patient here due to chest pain and noted to have a non-ST elevation MI.  Status post cardiac catheterization 2 days ago with stent to the proximal LAD.  Patient also had a RCA stenosis but given high risk stenting 2 days ago plan for repeat cardiac catheterization and stent placement this Monday.    No chest pain presently.  No acute events overnight.   REVIEW OF SYSTEMS:    Review of Systems  Constitutional: Negative for chills and fever.  HENT: Negative for congestion and tinnitus.   Eyes: Negative for blurred vision and double vision.  Respiratory: Negative for cough, shortness of breath and wheezing.   Cardiovascular: Negative for chest pain, orthopnea and PND.  Gastrointestinal: Negative for abdominal pain, diarrhea, nausea and vomiting.  Genitourinary: Negative for dysuria and hematuria.  Neurological: Negative for dizziness, sensory change and focal weakness.  All other systems reviewed and are negative.   Nutrition: Heart Healthy Tolerating Diet: yes Tolerating PT: ambulatory  DRUG ALLERGIES:   Allergies  Allergen Reactions  . Amoxicillin Itching and Other (See Comments)    Reaction: bump in vaginal region Has patient had a PCN reaction causing immediate rash, facial/tongue/throat swelling, SOB or lightheadedness with hypotension: No Has patient had a PCN reaction causing severe rash involving mucus membranes or skin necrosis: No Has patient had a PCN reaction that required hospitalization: No Has patient had a PCN reaction occurring within the last 10 years: No If all of the above answers are "NO", then may proceed with Cephalosporin use.     VITALS:  Blood pressure (!) 151/84, pulse (!) 58, temperature 98.6 F (37 C), temperature source Oral, resp. rate 18, height 5\' 3"  (1.6 m), weight 89.6 kg (197 lb 8 oz), SpO2 100  %.  PHYSICAL EXAMINATION:   Physical Exam  GENERAL:  57 y.o.-year-old patient lying in bed in no acute distress.  EYES: Pupils equal, round, reactive to light and accommodation. No scleral icterus. Extraocular muscles intact.  HEENT: Head atraumatic, normocephalic. Oropharynx and nasopharynx clear.  NECK:  Supple, no jugular venous distention. No thyroid enlargement, no tenderness.  LUNGS: Normal breath sounds bilaterally, no wheezing, rales, rhonchi. No use of accessory muscles of respiration.  CARDIOVASCULAR: S1, S2 normal. No murmurs, rubs, or gallops.  ABDOMEN: Soft, nontender, nondistended. Bowel sounds present. No organomegaly or mass.  EXTREMITIES: No cyanosis, clubbing or edema b/l.    NEUROLOGIC: Cranial nerves II through XII are intact. No focal Motor or sensory deficits b/l.   PSYCHIATRIC: The patient is alert and oriented x 3.  SKIN: No obvious rash, lesion, or ulcer.    LABORATORY PANEL:   CBC Recent Labs  Lab 05/17/18 0449  WBC 7.4  HGB 13.3  HCT 40.7  PLT 225   ------------------------------------------------------------------------------------------------------------------  Chemistries  Recent Labs  Lab 05/17/18 0449 05/17/18 1603  NA 137 138  K 3.0* 3.7  CL 104 104  CO2 24 23  GLUCOSE 95 91  BUN 11 15  CREATININE 0.80 0.72  CALCIUM 8.5* 9.2  MG 1.9  --    ------------------------------------------------------------------------------------------------------------------  Cardiac Enzymes Recent Labs  Lab 05/17/18 1006  TROPONINI 0.35*   ------------------------------------------------------------------------------------------------------------------  RADIOLOGY:  Ct Angio Head W Or Wo Contrast  Result Date: 05/16/2018 CLINICAL DATA:  Confusion following cardiac catheterization. EXAM: CT ANGIOGRAPHY HEAD AND NECK TECHNIQUE: Multidetector CT imaging  of the head and neck was performed using the standard protocol during bolus administration of  intravenous contrast. Multiplanar CT image reconstructions and MIPs were obtained to evaluate the vascular anatomy. Carotid stenosis measurements (when applicable) are obtained utilizing NASCET criteria, using the distal internal carotid diameter as the denominator. CONTRAST:  26mL OMNIPAQUE IOHEXOL 350 MG/ML SOLN COMPARISON:  Head CT 05/16/2018 FINDINGS: CT HEAD FINDINGS Brain: There is no mass, hemorrhage or extra-axial collection. The size and configuration of the ventricles and extra-axial CSF spaces are normal. Old right basal ganglia lacunar infarct. Brain parenchyma is otherwise normal. Skull: The visualized skull base, calvarium and extracranial soft tissues are normal. Sinuses/Orbits: Large area of calcification in the right maxillary sinus. The orbits are normal. CTA NECK FINDINGS AORTIC ARCH: There is no calcific atherosclerosis of the aortic arch. There is no aneurysm, dissection or hemodynamically significant stenosis of the visualized ascending aorta and aortic arch. Normal variant aortic arch branching pattern with the brachiocephalic and left common carotid arteries sharing a common origin. The visualized proximal subclavian arteries are widely patent. RIGHT CAROTID SYSTEM: --Common carotid artery: Widely patent origin without common carotid artery dissection or aneurysm. --Internal carotid artery: No dissection, occlusion or aneurysm. No hemodynamically significant stenosis. --External carotid artery: No acute abnormality. LEFT CAROTID SYSTEM: --Common carotid artery: Widely patent origin without common carotid artery dissection or aneurysm. --Internal carotid artery:No dissection, occlusion or aneurysm. No hemodynamically significant stenosis. --External carotid artery: No acute abnormality. VERTEBRAL ARTERIES: Right dominant configuration. Both origins are normal. No dissection, occlusion or flow-limiting stenosis to the vertebrobasilar confluence. SKELETON: There is no bony spinal canal stenosis.  No lytic or blastic lesion. OTHER NECK: Normal pharynx, larynx and major salivary glands. No cervical lymphadenopathy. Unremarkable thyroid gland. UPPER CHEST: No pneumothorax or pleural effusion. No nodules or masses. CTA HEAD FINDINGS ANTERIOR CIRCULATION: --Intracranial internal carotid arteries: Atherosclerotic calcification of the internal carotid arteries at the skull base without hemodynamically significant stenosis. --Anterior cerebral arteries: Normal. Absent right A1 segment, normal variant. Patent anterior communicating artery. --Middle cerebral arteries: Normal. --Posterior communicating arteries: Present on the right, absent on the left. POSTERIOR CIRCULATION: --Basilar artery: Normal. --Posterior cerebral arteries: Normal. --Superior cerebellar arteries: Normal. --Inferior cerebellar arteries: Normal anterior and posterior inferior cerebellar arteries. VENOUS SINUSES: As permitted by contrast timing, patent. ANATOMIC VARIANTS: Absent right A1 segment. DELAYED PHASE: No parenchymal contrast enhancement. Review of the MIP images confirms the above findings. IMPRESSION: 1. No emergent large vessel occlusion or hemodynamically significant stenosis. 2. Old right basal ganglia lacunar infarct without acute intracranial abnormality. 3. Mild aortic atherosclerosis (ICD10-I70.0). Electronically Signed   By: Ulyses Jarred M.D.   On: 05/16/2018 22:11   Ct Angio Neck W Or Wo Contrast  Result Date: 05/16/2018 CLINICAL DATA:  Confusion following cardiac catheterization. EXAM: CT ANGIOGRAPHY HEAD AND NECK TECHNIQUE: Multidetector CT imaging of the head and neck was performed using the standard protocol during bolus administration of intravenous contrast. Multiplanar CT image reconstructions and MIPs were obtained to evaluate the vascular anatomy. Carotid stenosis measurements (when applicable) are obtained utilizing NASCET criteria, using the distal internal carotid diameter as the denominator. CONTRAST:  31mL  OMNIPAQUE IOHEXOL 350 MG/ML SOLN COMPARISON:  Head CT 05/16/2018 FINDINGS: CT HEAD FINDINGS Brain: There is no mass, hemorrhage or extra-axial collection. The size and configuration of the ventricles and extra-axial CSF spaces are normal. Old right basal ganglia lacunar infarct. Brain parenchyma is otherwise normal. Skull: The visualized skull base, calvarium and extracranial soft tissues are normal. Sinuses/Orbits: Large area  of calcification in the right maxillary sinus. The orbits are normal. CTA NECK FINDINGS AORTIC ARCH: There is no calcific atherosclerosis of the aortic arch. There is no aneurysm, dissection or hemodynamically significant stenosis of the visualized ascending aorta and aortic arch. Normal variant aortic arch branching pattern with the brachiocephalic and left common carotid arteries sharing a common origin. The visualized proximal subclavian arteries are widely patent. RIGHT CAROTID SYSTEM: --Common carotid artery: Widely patent origin without common carotid artery dissection or aneurysm. --Internal carotid artery: No dissection, occlusion or aneurysm. No hemodynamically significant stenosis. --External carotid artery: No acute abnormality. LEFT CAROTID SYSTEM: --Common carotid artery: Widely patent origin without common carotid artery dissection or aneurysm. --Internal carotid artery:No dissection, occlusion or aneurysm. No hemodynamically significant stenosis. --External carotid artery: No acute abnormality. VERTEBRAL ARTERIES: Right dominant configuration. Both origins are normal. No dissection, occlusion or flow-limiting stenosis to the vertebrobasilar confluence. SKELETON: There is no bony spinal canal stenosis. No lytic or blastic lesion. OTHER NECK: Normal pharynx, larynx and major salivary glands. No cervical lymphadenopathy. Unremarkable thyroid gland. UPPER CHEST: No pneumothorax or pleural effusion. No nodules or masses. CTA HEAD FINDINGS ANTERIOR CIRCULATION: --Intracranial internal  carotid arteries: Atherosclerotic calcification of the internal carotid arteries at the skull base without hemodynamically significant stenosis. --Anterior cerebral arteries: Normal. Absent right A1 segment, normal variant. Patent anterior communicating artery. --Middle cerebral arteries: Normal. --Posterior communicating arteries: Present on the right, absent on the left. POSTERIOR CIRCULATION: --Basilar artery: Normal. --Posterior cerebral arteries: Normal. --Superior cerebellar arteries: Normal. --Inferior cerebellar arteries: Normal anterior and posterior inferior cerebellar arteries. VENOUS SINUSES: As permitted by contrast timing, patent. ANATOMIC VARIANTS: Absent right A1 segment. DELAYED PHASE: No parenchymal contrast enhancement. Review of the MIP images confirms the above findings. IMPRESSION: 1. No emergent large vessel occlusion or hemodynamically significant stenosis. 2. Old right basal ganglia lacunar infarct without acute intracranial abnormality. 3. Mild aortic atherosclerosis (ICD10-I70.0). Electronically Signed   By: Ulyses Jarred M.D.   On: 05/16/2018 22:11   US Carotid Bilateral  Result Date: 05/17/2018 CLINICAL DATA:  Stroke. EXAM: BILATERAL CAROTID DUPLEX ULTRASOUND TECHNIQUE: Pearline Cables scale imaging, color Doppler and duplex ultrasound was performed of bilateral carotid and vertebral arteries in the neck. COMPARISON:  CTA neck from the previous day TECHNIQUE: Quantification of carotid stenosis is based on velocity parameters that correlate the residual internal carotid diameter with NASCET-based stenosis levels, using the diameter of the distal internal carotid lumen as the denominator for stenosis measurement. The following velocity measurements were obtained: PEAK SYSTOLIC/END DIASTOLIC RIGHT ICA:                     81/12cm/sec CCA:                     161/[id number]/AVW SYSTOLIC ICA/CCA RATIO:  0.6 ECA:                     154cm/sec LEFT ICA:                     111/36cm/sec CCA:                      [id number]/[id number]/JYN SYSTOLIC ICA/CCA RATIO:  1.1 ECA:                     139cm/sec FINDINGS: RIGHT CAROTID ARTERY: Mild tortuosity. Minimal eccentric partially calcified plaque in the bulb. No high-grade stenosis. Normal waveforms and color Doppler signal. RIGHT  VERTEBRAL ARTERY:  Normal flow direction and waveform. LEFT CAROTID ARTERY: Mild eccentric partially calcified plaque in the bulb extending to the ICA origin. No high-grade stenosis. Normal waveforms and color Doppler signal. Mild tortuosity. LEFT VERTEBRAL ARTERY: Normal flow direction and waveform. IMPRESSION: 1. Mild bilateral carotid bifurcation plaque resulting in less than 50% diameter stenosis. 2.  Antegrade bilateral vertebral arterial flow. Electronically Signed   By: Lucrezia Europe M.D.   On: 05/17/2018 14:56   Ct Head Code Stroke Wo Contrast`  Result Date: 05/16/2018 CLINICAL DATA:  Code stroke. Confusion, acute, unexplained. Cardiac catheterization this morning. EXAM: CT HEAD WITHOUT CONTRAST TECHNIQUE: Contiguous axial images were obtained from the base of the skull through the vertex without intravenous contrast. COMPARISON:  None. FINDINGS: Brain: No evidence of acute infarction, hemorrhage, hydrocephalus, extra-axial collection or mass lesion/mass effect. Well-defined low-density curvilinear infarct in the right basal ganglia and corona radiata, lateral lenticulostriate distribution. This has a chronic appearance. Vascular: High-density vessels in the setting of recent cardiac catheterization. Atherosclerotic calcification Skull: Negative Sinuses/Orbits: Negative Other: These results were called by telephone at the time of interpretation on 05/16/2018 at 1:38 pm to Dr. Vaughan Basta , who verbally acknowledged these results. ASPECTS [facility] Stroke Program Early CT Score) Not graded with this history IMPRESSION: 1. No acute finding. 2. 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 Electronically Signed   By: Monte Fantasia M.D.   On: 05/16/2018 13:39     ASSESSMENT AND PLAN:   57 year old female with past medical history of hypertension, asthma who presented to the hospital due to chest pain and noted to have a non-ST elevation MI.  1.  Non-ST elevation MI- patient is status post cardiac catheterization with stent to the proximal LAD.  Patient also was also noted to have a RCA lesion.  She needs a staged PCI for this which is likely to happen tomorrow -Continue aspirin, Brilinta, atorvastatin, carvedilol. -Currently chest pain-free and hemodynamically stable.  2.  Essential hypertension-continue losartan, HCTZ, carvedilol.  3.  Altered mental status-patient developed this post cardiac catheterization and code stroke was activated.  Patient underwent extensive neurologic work-up including CT of the head, CT angiogram of the head which was negative for acute pathology. - back to baseline now.   4.  Hyperlipidemia-continue atorvastatin.  5.  Asthma-no acute exacerbation.   All the records are reviewed and case discussed with Care Management/Social Worker. Management plans discussed with the patient, family and they are in agreement.  CODE STATUS: Full code  DVT Prophylaxis: Lovenox  TOTAL TIME TAKING CARE OF THIS PATIENT: 25 minutes.   POSSIBLE D/C IN 1-2 DAYS, DEPENDING ON CLINICAL CONDITION.   Henreitta Leber M.D on 05/18/2018 at 1:07 PM  Between 7am to 6pm - Pager - [phone]  After 6pm go to [email or web address] - Proofreader  [facility]  Office  [phone]  CC: Primary care physician; System, Pcp Not In

## 2018-05-18 NOTE — Progress Notes (Signed)
Subjective:  No chest pain or focal neurologic issues  Objective:  Vitals:   05/17/18 1613 05/17/18 1922 05/18/18 0424 05/18/18 0921  BP: (!) 163/83 133/64 (!) 154/82 (!) 151/84  Pulse: 66 61 65 (!) 58  Resp: 14 18 16 18   Temp:  98.9 F (37.2 C) 99.3 F (37.4 C)   TempSrc:  Oral Oral   SpO2: 100% 99% 98% 100%  Weight:      Height:        Intake/Output from previous day:  Intake/Output Summary (Last 24 hours) at 05/18/2018 0951 Last data filed at 05/18/2018 9735 Gross per 24 hour  Intake -  Output 450 ml  Net -450 ml    Physical Exam: Affect appropriate Overweight black female  HEENT: normal Neck supple with no adenopathy JVP normal no bruits no thyromegaly Lungs clear with no wheezing and good diaphragmatic motion Heart:  S1/S2 no murmur, no rub, gallop or click PMI normal Abdomen: benighn, BS positve, no tenderness, no AAA no bruit.  No HSM or HJR Distal pulses intact with no bruits No edema Neuro non-focal Skin warm and dry No muscular weakness Right radial cath sight A   Lab Results: Basic Metabolic Panel: Recent Labs    05/16/18 0656 05/17/18 0449 05/17/18 1603  NA  --  137 138  K  --  3.0* 3.7  CL  --  104 104  CO2  --  24 23  GLUCOSE  --  95 91  BUN  --  11 15  CREATININE  --  0.80 0.72  CALCIUM  --  8.5* 9.2  MG 2.6* 1.9  --   PHOS  --  4.2  --    Liver Function Tests: No results for input(s): AST, ALT, ALKPHOS, BILITOT, PROT, ALBUMIN in the last 72 hours. No results for input(s): LIPASE, AMYLASE in the last 72 hours. CBC: Recent Labs    05/16/18 0659 05/17/18 0449  WBC 6.4 7.4  HGB 14.0 13.3  HCT 42.6 40.7  MCV 82.0 81.7  PLT 248 225   Cardiac Enzymes: Recent Labs    05/16/18 2305 05/17/18 0449 05/17/18 1006  TROPONINI 0.30* 0.38* 0.35*   BNP: Invalid input(s): POCBNP D-Dimer: No results for input(s): DDIMER in the last 72 hours. Hemoglobin A1C: Recent Labs    05/16/18 0228  HGBA1C 5.7*   Fasting Lipid  Panel: Recent Labs    05/16/18 0009  CHOL 232*  HDL 55  LDLCALC 168*  TRIG 43  CHOLHDL 4.2   Thyroid Function Tests: Recent Labs    05/16/18 0228  TSH 1.115   Anemia Panel: No results for input(s): VITAMINB12, FOLATE, FERRITIN, TIBC, IRON, RETICCTPCT in the last 72 hours.  Imaging: Ct Angio Head W Or Wo Contrast  Result Date: 05/16/2018 CLINICAL DATA:  Confusion following cardiac catheterization. EXAM: CT ANGIOGRAPHY HEAD AND NECK TECHNIQUE: Multidetector CT imaging of the head and neck was performed using the standard protocol during bolus administration of intravenous contrast. Multiplanar CT image reconstructions and MIPs were obtained to evaluate the vascular anatomy. Carotid stenosis measurements (when applicable) are obtained utilizing NASCET criteria, using the distal internal carotid diameter as the denominator. CONTRAST:  50mL OMNIPAQUE IOHEXOL 350 MG/ML SOLN COMPARISON:  Head CT 05/16/2018 FINDINGS: CT HEAD FINDINGS Brain: There is no mass, hemorrhage or extra-axial collection. The size and configuration of the ventricles and extra-axial CSF spaces are normal. Old right basal ganglia lacunar infarct. Brain parenchyma is otherwise normal. Skull: The visualized skull base, calvarium and  extracranial soft tissues are normal. Sinuses/Orbits: Large area of calcification in the right maxillary sinus. The orbits are normal. CTA NECK FINDINGS AORTIC ARCH: There is no calcific atherosclerosis of the aortic arch. There is no aneurysm, dissection or hemodynamically significant stenosis of the visualized ascending aorta and aortic arch. Normal variant aortic arch branching pattern with the brachiocephalic and left common carotid arteries sharing a common origin. The visualized proximal subclavian arteries are widely patent. RIGHT CAROTID SYSTEM: --Common carotid artery: Widely patent origin without common carotid artery dissection or aneurysm. --Internal carotid artery: No dissection, occlusion  or aneurysm. No hemodynamically significant stenosis. --External carotid artery: No acute abnormality. LEFT CAROTID SYSTEM: --Common carotid artery: Widely patent origin without common carotid artery dissection or aneurysm. --Internal carotid artery:No dissection, occlusion or aneurysm. No hemodynamically significant stenosis. --External carotid artery: No acute abnormality. VERTEBRAL ARTERIES: Right dominant configuration. Both origins are normal. No dissection, occlusion or flow-limiting stenosis to the vertebrobasilar confluence. SKELETON: There is no bony spinal canal stenosis. No lytic or blastic lesion. OTHER NECK: Normal pharynx, larynx and major salivary glands. No cervical lymphadenopathy. Unremarkable thyroid gland. UPPER CHEST: No pneumothorax or pleural effusion. No nodules or masses. CTA HEAD FINDINGS ANTERIOR CIRCULATION: --Intracranial internal carotid arteries: Atherosclerotic calcification of the internal carotid arteries at the skull base without hemodynamically significant stenosis. --Anterior cerebral arteries: Normal. Absent right A1 segment, normal variant. Patent anterior communicating artery. --Middle cerebral arteries: Normal. --Posterior communicating arteries: Present on the right, absent on the left. POSTERIOR CIRCULATION: --Basilar artery: Normal. --Posterior cerebral arteries: Normal. --Superior cerebellar arteries: Normal. --Inferior cerebellar arteries: Normal anterior and posterior inferior cerebellar arteries. VENOUS SINUSES: As permitted by contrast timing, patent. ANATOMIC VARIANTS: Absent right A1 segment. DELAYED PHASE: No parenchymal contrast enhancement. Review of the MIP images confirms the above findings. IMPRESSION: 1. No emergent large vessel occlusion or hemodynamically significant stenosis. 2. Old right basal ganglia lacunar infarct without acute intracranial abnormality. 3. Mild aortic atherosclerosis (ICD10-I70.0). Electronically Signed   By: Ulyses Jarred M.D.   On:  05/16/2018 22:11   Ct Angio Neck W Or Wo Contrast  Result Date: 05/16/2018 CLINICAL DATA:  Confusion following cardiac catheterization. EXAM: CT ANGIOGRAPHY HEAD AND NECK TECHNIQUE: Multidetector CT imaging of the head and neck was performed using the standard protocol during bolus administration of intravenous contrast. Multiplanar CT image reconstructions and MIPs were obtained to evaluate the vascular anatomy. Carotid stenosis measurements (when applicable) are obtained utilizing NASCET criteria, using the distal internal carotid diameter as the denominator. CONTRAST:  71mL OMNIPAQUE IOHEXOL 350 MG/ML SOLN COMPARISON:  Head CT 05/16/2018 FINDINGS: CT HEAD FINDINGS Brain: There is no mass, hemorrhage or extra-axial collection. The size and configuration of the ventricles and extra-axial CSF spaces are normal. Old right basal ganglia lacunar infarct. Brain parenchyma is otherwise normal. Skull: The visualized skull base, calvarium and extracranial soft tissues are normal. Sinuses/Orbits: Large area of calcification in the right maxillary sinus. The orbits are normal. CTA NECK FINDINGS AORTIC ARCH: There is no calcific atherosclerosis of the aortic arch. There is no aneurysm, dissection or hemodynamically significant stenosis of the visualized ascending aorta and aortic arch. Normal variant aortic arch branching pattern with the brachiocephalic and left common carotid arteries sharing a common origin. The visualized proximal subclavian arteries are widely patent. RIGHT CAROTID SYSTEM: --Common carotid artery: Widely patent origin without common carotid artery dissection or aneurysm. --Internal carotid artery: No dissection, occlusion or aneurysm. No hemodynamically significant stenosis. --External carotid artery: No acute abnormality. LEFT CAROTID SYSTEM: --Common carotid  artery: Widely patent origin without common carotid artery dissection or aneurysm. --Internal carotid artery:No dissection, occlusion or  aneurysm. No hemodynamically significant stenosis. --External carotid artery: No acute abnormality. VERTEBRAL ARTERIES: Right dominant configuration. Both origins are normal. No dissection, occlusion or flow-limiting stenosis to the vertebrobasilar confluence. SKELETON: There is no bony spinal canal stenosis. No lytic or blastic lesion. OTHER NECK: Normal pharynx, larynx and major salivary glands. No cervical lymphadenopathy. Unremarkable thyroid gland. UPPER CHEST: No pneumothorax or pleural effusion. No nodules or masses. CTA HEAD FINDINGS ANTERIOR CIRCULATION: --Intracranial internal carotid arteries: Atherosclerotic calcification of the internal carotid arteries at the skull base without hemodynamically significant stenosis. --Anterior cerebral arteries: Normal. Absent right A1 segment, normal variant. Patent anterior communicating artery. --Middle cerebral arteries: Normal. --Posterior communicating arteries: Present on the right, absent on the left. POSTERIOR CIRCULATION: --Basilar artery: Normal. --Posterior cerebral arteries: Normal. --Superior cerebellar arteries: Normal. --Inferior cerebellar arteries: Normal anterior and posterior inferior cerebellar arteries. VENOUS SINUSES: As permitted by contrast timing, patent. ANATOMIC VARIANTS: Absent right A1 segment. DELAYED PHASE: No parenchymal contrast enhancement. Review of the MIP images confirms the above findings. IMPRESSION: 1. No emergent large vessel occlusion or hemodynamically significant stenosis. 2. Old right basal ganglia lacunar infarct without acute intracranial abnormality. 3. Mild aortic atherosclerosis (ICD10-I70.0). Electronically Signed   By: Ulyses Jarred M.D.   On: 05/16/2018 22:11   US Carotid Bilateral  Result Date: 05/17/2018 CLINICAL DATA:  Stroke. EXAM: BILATERAL CAROTID DUPLEX ULTRASOUND TECHNIQUE: Pearline Cables scale imaging, color Doppler and duplex ultrasound was performed of bilateral carotid and vertebral arteries in the neck.  COMPARISON:  CTA neck from the previous day TECHNIQUE: Quantification of carotid stenosis is based on velocity parameters that correlate the residual internal carotid diameter with NASCET-based stenosis levels, using the diameter of the distal internal carotid lumen as the denominator for stenosis measurement. The following velocity measurements were obtained: PEAK SYSTOLIC/END DIASTOLIC RIGHT ICA:                     81/12cm/sec CCA:                     [id number] SYSTOLIC ICA/CCA RATIO:  0.6 ECA:                     154cm/sec LEFT ICA:                     111/36cm/sec CCA:                     [id number] SYSTOLIC ICA/CCA RATIO:  1.1 ECA:                     139cm/sec FINDINGS: RIGHT CAROTID ARTERY: Mild tortuosity. Minimal eccentric partially calcified plaque in the bulb. No high-grade stenosis. Normal waveforms and color Doppler signal. RIGHT VERTEBRAL ARTERY:  Normal flow direction and waveform. LEFT CAROTID ARTERY: Mild eccentric partially calcified plaque in the bulb extending to the ICA origin. No high-grade stenosis. Normal waveforms and color Doppler signal. Mild tortuosity. LEFT VERTEBRAL ARTERY: Normal flow direction and waveform. IMPRESSION: 1. Mild bilateral carotid bifurcation plaque resulting in less than 50% diameter stenosis. 2.  Antegrade bilateral vertebral arterial flow. Electronically Signed   By: Lucrezia Europe M.D.   On: 05/17/2018 14:56   Ct Head Code Stroke Wo Contrast`  Result Date: 05/16/2018 CLINICAL DATA:  Code stroke. Confusion, acute, unexplained. Cardiac catheterization this morning. EXAM: CT HEAD WITHOUT CONTRAST  TECHNIQUE: Contiguous axial images were obtained from the base of the skull through the vertex without intravenous contrast. COMPARISON:  None. FINDINGS: Brain: No evidence of acute infarction, hemorrhage, hydrocephalus, extra-axial collection or mass lesion/mass effect. Well-defined low-density curvilinear infarct in the right basal ganglia and corona radiata, lateral  lenticulostriate distribution. This has a chronic appearance. Vascular: High-density vessels in the setting of recent cardiac catheterization. Atherosclerotic calcification Skull: Negative Sinuses/Orbits: Negative Other: These results were called by telephone at the time of interpretation on 05/16/2018 at 1:38 pm to Dr. Vaughan Basta , who verbally acknowledged these results. ASPECTS [facility] Stroke Program Early CT Score) Not graded with this history IMPRESSION: 1. No acute finding. 2. 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 Electronically Signed   By: Monte Fantasia M.D.   On: 05/16/2018 13:39    Cardiac Studies:  ECG: SR deep biphasic T wave changes anterolateral    Telemetry: NSR no VT 05/18/2018   Echo: EF 55-60%   Medications:   . aspirin EC  81 mg Oral Daily  . atorvastatin  80 mg Oral q1800  . carvedilol  6.25 mg Oral BID WC  . docusate sodium  100 mg Oral BID  . enoxaparin (LOVENOX) injection  40 mg Subcutaneous Q24H  . losartan  100 mg Oral Daily   And  . hydrochlorothiazide  25 mg Oral Daily  . mupirocin ointment  1 application Nasal BID  . potassium chloride SA  20 mEq Oral Daily  . sodium chloride flush  3 mL Intravenous Q12H  . ticagrelor  90 mg Oral BID     . sodium chloride    . dexmedetomidine (PRECEDEX) IV infusion Stopped (05/16/18 1830)    Assessment/Plan:   CAD:  Post difficult intervention to proximal and mid LAD 05/16/18 with DES stent no chest pain Minor troponin elevation DAT with ticagrelor continue beta blocker for staged procedure form RFA With Dr Fletcher Anon to RCA on Monday   MS:  Back to baseline CT/CTA head normal Precedex off No evidence of peri procedural stroke  HLD:  Continue statin  HTN:  Well controlled.  Continue current medications and low sodium Dash type diet.   Continue losartan  Jenkins Rouge 05/18/2018, 9:51 AM

## 2018-05-19 ENCOUNTER — Encounter
Admission: EM | Disposition: A | Discharge status: Home / Self Care | Payer: Self-pay | Source: Home / Self Care | Attending: Specialist

## 2018-05-19 ENCOUNTER — Encounter: Payer: Self-pay | Admitting: Cardiovascular Disease

## 2018-05-19 DIAGNOSIS — R51 Headache: Secondary | ICD-10-CM

## 2018-05-19 HISTORY — PX: CORONARY STENT INTERVENTION: CATH118234

## 2018-05-19 LAB — BASIC METABOLIC PANEL
Anion gap: 7 (ref 5–15)
BUN: 17 mg/dL (ref 6–20)
CO2: 28 mmol/L (ref 22–32)
Calcium: 9 mg/dL (ref 8.9–10.3)
Chloride: 104 mmol/L (ref 98–111)
Creatinine, Ser: 0.55 mg/dL (ref 0.44–1.00)
GFR calc Af Amer: >60 mL/min (ref >60)
GLUCOSE: 98 mg/dL (ref 70–99)
POTASSIUM: 3.3 mmol/L — AB (ref 3.5–5.1)
Sodium: 139 mmol/L (ref 135–145)

## 2018-05-19 LAB — CBC
HEMATOCRIT: 42.4 % (ref 35.0–47.0)
Hemoglobin: 13.7 g/dL (ref 12.0–16.0)
MCH: 26.2 pg (ref 26.0–34.0)
MCHC: 32.4 g/dL (ref 32.0–36.0)
MCV: 80.7 fL (ref 80.0–100.0)
Platelets: 215 10*3/uL (ref 150–440)
RBC: 5.25 MIL/uL — ABNORMAL HIGH (ref 3.80–5.20)
RDW: 14.9 % — AB (ref 11.5–14.5)
WBC: 4.9 10*3/uL (ref 3.6–11.0)

## 2018-05-19 LAB — POCT ACTIVATED CLOTTING TIME: Activated Clotting Time: 428 seconds

## 2018-05-19 SURGERY — CORONARY STENT INTERVENTION
Anesthesia: Moderate Sedation

## 2018-05-19 MED ORDER — CARVEDILOL 12.5 MG PO TABS
12.5000 mg | ORAL_TABLET | Freq: Two times a day (BID) | ORAL | Status: DC
  Administered 2018-05-19 – 2018-05-20 (×2): 12.5 mg via ORAL
  Filled 2018-05-19 (×2): qty 1

## 2018-05-19 MED ORDER — MIDAZOLAM HCL 2 MG/2ML IJ SOLN
INTRAMUSCULAR | Status: AC
  Filled 2018-05-19: qty 2

## 2018-05-19 MED ORDER — MIDAZOLAM HCL 2 MG/2ML IJ SOLN
INTRAMUSCULAR | Status: DC | PRN
  Administered 2018-05-19 (×2): 1 mg via INTRAVENOUS

## 2018-05-19 MED ORDER — SODIUM CHLORIDE 0.9 % IV SOLN
250.0000 mL | INTRAVENOUS | Status: DC | PRN
Start: 2018-05-19 — End: 2018-05-20

## 2018-05-19 MED ORDER — ATROPINE SULFATE 1 MG/10ML IJ SOSY
PREFILLED_SYRINGE | INTRAMUSCULAR | Status: DC | PRN
  Administered 2018-05-19: 0.5 mg via INTRAVENOUS

## 2018-05-19 MED ORDER — IOPAMIDOL (ISOVUE-300) INJECTION 61%
INTRAVENOUS | Status: DC | PRN
  Administered 2018-05-19: 100 mL via INTRA_ARTERIAL

## 2018-05-19 MED ORDER — LIDOCAINE HCL (PF) 1 % IJ SOLN
INTRAMUSCULAR | Status: DC | PRN
  Administered 2018-05-19: 20 mL

## 2018-05-19 MED ORDER — SODIUM CHLORIDE 0.9% FLUSH: 3.0000 mL | INTRAVENOUS | Status: DC | PRN

## 2018-05-19 MED ORDER — ONDANSETRON HCL 4 MG/2ML IJ SOLN
INTRAMUSCULAR | Status: AC
Start: 2018-05-19 — End: ?
  Filled 2018-05-19: qty 2

## 2018-05-19 MED ORDER — NITROGLYCERIN 5 MG/ML IV SOLN
INTRAVENOUS | Status: AC
  Filled 2018-05-19: qty 10

## 2018-05-19 MED ORDER — NITROGLYCERIN 1 MG/10 ML FOR IR/CATH LAB
INTRA_ARTERIAL | Status: DC | PRN
  Administered 2018-05-19: 200 ug via INTRACORONARY

## 2018-05-19 MED ORDER — ROSUVASTATIN CALCIUM 20 MG PO TABS
20.0000 mg | ORAL_TABLET | Freq: Every day | ORAL | Status: DC
  Administered 2018-05-19: 20 mg via ORAL
  Filled 2018-05-19 (×2): qty 1
  Filled 2018-05-19: qty 2

## 2018-05-19 MED ORDER — ATROPINE SULFATE 1 MG/10ML IJ SOSY
PREFILLED_SYRINGE | INTRAMUSCULAR | Status: AC
  Filled 2018-05-19: qty 10

## 2018-05-19 MED ORDER — BIVALIRUDIN TRIFLUOROACETATE 250 MG IV SOLR
INTRAVENOUS | Status: DC | PRN
  Administered 2018-05-19: 1.75 mg/kg/h via INTRAVENOUS

## 2018-05-19 MED ORDER — LIDOCAINE HCL (PF) 1 % IJ SOLN
INTRAMUSCULAR | Status: AC
  Filled 2018-05-19: qty 30

## 2018-05-19 MED ORDER — ONDANSETRON HCL 4 MG/2ML IJ SOLN
INTRAMUSCULAR | Status: DC | PRN
  Administered 2018-05-19: 4 mg via INTRAVENOUS

## 2018-05-19 MED ORDER — SODIUM CHLORIDE 0.9% FLUSH
3.0000 mL | Freq: Two times a day (BID) | INTRAVENOUS | Status: DC
  Administered 2018-05-19 (×2): 3 mL via INTRAVENOUS

## 2018-05-19 MED ORDER — BIVALIRUDIN BOLUS VIA INFUSION - CUPID
INTRAVENOUS | Status: DC | PRN
  Administered 2018-05-19: 66.6 mg via INTRAVENOUS

## 2018-05-19 MED ORDER — BIVALIRUDIN TRIFLUOROACETATE 250 MG IV SOLR
INTRAVENOUS | Status: AC
  Filled 2018-05-19: qty 250

## 2018-05-19 MED ORDER — SODIUM CHLORIDE 0.9 % IV SOLN
INTRAVENOUS | Status: AC
  Administered 2018-05-19: 14:00:00 via INTRAVENOUS

## 2018-05-19 SURGICAL SUPPLY — 14 items
BALLN TREK RX 2.5X12 (BALLOONS) ×3
BALLN ~~LOC~~ TREK RX 3.25X12 (BALLOONS) ×3
BALLOON TREK RX 2.5X12 (BALLOONS) ×1 IMPLANT
BALLOON ~~LOC~~ TREK RX 3.25X12 (BALLOONS) ×1 IMPLANT
CATH VISTA GUIDE 6FR JR4 (CATHETERS) ×3 IMPLANT
DEVICE CLOSURE MYNXGRIP 6/7F (Vascular Products) ×3 IMPLANT
DEVICE INFLAT 30 PLUS (MISCELLANEOUS) ×3 IMPLANT
KIT MANI 3VAL PERCEP (MISCELLANEOUS) ×3 IMPLANT
NEEDLE PERC 18GX7CM (NEEDLE) ×3 IMPLANT
PACK CARDIAC CATH (CUSTOM PROCEDURE TRAY) ×3 IMPLANT
SHEATH AVANTI 6FR X 11CM (SHEATH) ×3 IMPLANT
STENT SIERRA 3.25 X 18 MM (Permanent Stent) ×3 IMPLANT
WIRE GUIDERIGHT .035X150 (WIRE) ×3 IMPLANT
WIRE INTUITION PROPEL ST 180CM (WIRE) ×3 IMPLANT

## 2018-05-19 NOTE — Progress Notes (Signed)
Progress Note  Patient Name: Kelsey Terry Date of Encounter: 05/19/2018  Primary Cardiologist: New to [facility] - consult by Fletcher Anon  Subjective   For staged PCI to the RCA today. No chest pain or SOB. Ambulating in the hallway without issues. Has a headache.   Inpatient Medications    Scheduled Meds: . aspirin EC  81 mg Oral Daily  . atorvastatin  80 mg Oral q1800  . carvedilol  6.25 mg Oral BID WC  . docusate sodium  100 mg Oral BID  . enoxaparin (LOVENOX) injection  40 mg Subcutaneous Q24H  . losartan  100 mg Oral Daily   And  . hydrochlorothiazide  25 mg Oral Daily  . mupirocin ointment  1 application Nasal BID  . potassium chloride SA  20 mEq Oral Daily  . sodium chloride flush  3 mL Intravenous Q12H  . ticagrelor  90 mg Oral BID   Continuous Infusions: . sodium chloride    . dexmedetomidine (PRECEDEX) IV infusion Stopped (05/16/18 1830)   PRN Meds: sodium chloride, acetaminophen **OR** acetaminophen, hydrALAZINE, nitroGLYCERIN, ondansetron **OR** ondansetron (ZOFRAN) IV, sodium chloride flush   Vital Signs    Vitals:   05/18/18 1647 05/18/18 1926 05/19/18 0521 05/19/18 0911  BP: 137/72 (!) 145/81 127/72 (!) 180/92  Pulse: 61 65 68 67  Resp: 18 18 18 19   Temp: 98.7 F (37.1 C) 98.9 F (37.2 C) 98.4 F (36.9 C)   TempSrc: Oral Oral Oral   SpO2: 100% 100% 98% 99%  Weight:   195 lb 12.8 oz (88.8 kg)   Height:        Intake/Output Summary (Last 24 hours) at 05/19/2018 0916 Last data filed at 05/19/2018 0521 Gross per 24 hour  Intake 0 ml  Output 2100 ml  Net -2100 ml   Filed Weights   05/17/18 0430 05/17/18 1057 05/19/18 0521  Weight: 196 lb 3.4 oz (89 kg) 197 lb 8 oz (89.6 kg) 195 lb 12.8 oz (88.8 kg)    Telemetry    NSR, PACs/PVCs - Personally Reviewed  ECG    n/a - Personally Reviewed  Physical Exam   GEN: No acute distress.   Neck: No JVD. Cardiac: RRR, no murmurs, rubs, or gallops. Right radial cardiac cath site without bleeding, bruising,  swelling, erythema, warmth, or TTP. Radial pulse 2+. Respiratory: Clear to auscultation bilaterally.  GI: Soft, nontender, non-distended.   MS: No edema; No deformity. Neuro:  Alert and oriented x 3; Nonfocal.  Psych: Normal affect.  Labs    Chemistry Recent Labs  Lab 05/15/18 2202 05/17/18 0449 05/17/18 1603  NA 138 137 138  K 4.0 3.0* 3.7  CL 103 104 104  CO2 27 24 23   GLUCOSE 87 95 91  BUN 12 11 15   CREATININE 0.89 0.80 0.72  CALCIUM 9.1 8.5* 9.2  GFRNONAA >60 >60 >60  GFRAA >60 >60 >60  ANIONGAP 8 9 11      Hematology Recent Labs  Lab 05/15/18 2202 05/16/18 0659 05/17/18 0449  WBC 6.1 6.4 7.4  RBC 4.98 5.19 4.98  HGB 13.2 14.0 13.3  HCT 41.1 42.6 40.7  MCV 82.6 82.0 81.7  MCH 26.6 27.0 26.8  MCHC 32.2 33.0 32.7  RDW 15.0* 14.7* 14.9*  PLT 238 248 225    Cardiac Enzymes Recent Labs  Lab 05/16/18 1623 05/16/18 2305 05/17/18 0449 05/17/18 1006  TROPONINI 0.18* 0.30* 0.38* 0.35*   No results for input(s): TROPIPOC in the last 168 hours.   BNPNo results for  input(s): BNP, PROBNP in the last 168 hours.   DDimer No results for input(s): DDIMER in the last 168 hours.   Radiology    US Carotid Bilateral  Result Date: 05/17/2018 IMPRESSION: 1. Mild bilateral carotid bifurcation plaque resulting in less than 50% diameter stenosis. 2.  Antegrade bilateral vertebral arterial flow. Electronically Signed   By: Lucrezia Europe M.D.   On: 05/17/2018 14:56    Cardiac Studies   LHC 05/16/2018: Conclusion     Mid RCA lesion is 90% stenosed.  Dist RCA lesion is 20% stenosed.  Ost Cx to Prox Cx lesion is 30% stenosed.  Ost LAD to Prox LAD lesion is 95% stenosed.  Post intervention, there is a 0% residual stenosis.  A drug-eluting stent was successfully placed using a STENT SIERRA 3.25 X 15 MM.  Mid LAD lesion is 30% stenosed.  Ost 1st Diag lesion is 70% stenosed.  A stent was successfully placed.   1. Severe two-vessel coronary artery disease.  The  culprit is 95% proximal LAD stenosis.  There is also 90% mid RCA stenosis. 2.  Left ventricular angiography could not be performed due to difficulty advancing the pigtail catheter as a result of severe radial artery spasm. Obtain an echocardiogram.  3.  Successful angioplasty and drug-eluting stent placement to the proximal LAD.  Recommendations: Dual antiplatelet therapy for at least one year. Blood pressure control and aggressive treatment of risk factors. Staged RCA PCI on Monday.  I might consider the femoral artery approach given radial artery spasm.    TTE 05/16/2018: Study Conclusions  - Left ventricle: The cavity size was normal. There was mild   concentric hypertrophy. Systolic function was normal. The   estimated ejection fraction was in the range of 55% to 60%. Wall   motion was normal; there were no regional wall motion   abnormalities. Doppler parameters are consistent with abnormal   left ventricular relaxation (grade 1 diastolic dysfunction). - Aortic valve: There was trivial regurgitation. - Mitral valve: Calcified annulus. There was mild regurgitation. - Left atrium: The atrium was mildly dilated.  Patient Profile     57 y.o. female with history of pericarditis s/p pericardiocentesis ~ 7-8 years prior in [place], HTN, asthma, and lymphedema who is being seen today for the evaluation of NSTEMI.  Assessment & Plan    1. NSTEMI: -Currently chest pain free -DAPT with ASA and Brilinta without interruption for at least the next 12 months -Staged PCI to the RCA today -NPO -Aggressive secondary prevention  -Coreg -Hold Lipitor as below given HA -Cardiac rehab -TTE as above  2. Confusion: -Resolved, back to baseline -CT/CTA head normal -Precedex off -No evidence of peri-procedural stroke  3. Headache: -Stop Lipitor and see how she feels -Not on any nitro  4. HTN: -Poorly controlled -Possibly exacerbating her headache -Increase Coreg to 12.5 mg bid -Continue  losartan 100 mg and HCTZ 25 mg daily  5. HLD: -Lipitor stopped as above to see how her head feels -Goal LDL < 70 -May need to consider alternative statin vs PCSK-9 inhibitor   For questions or updates, please contact [facility] Please consult [email or web address] for contact info under Cardiology/STEMI.    Signed, Christell Faith, PA-C [place] Pager: [phone] 05/19/2018, 9:16 AM

## 2018-05-19 NOTE — Progress Notes (Signed)
Patient remains clinically stable post heart cath per Dr Fletcher Anon with DES to RCA, no bleeding nor hematoma to right groin,. Son at bedside. Denies  Complaints at this time. Alert and oriented, post cath,  Answering questions appropriately with only Versed iv given for today's procedure.

## 2018-05-19 NOTE — Progress Notes (Signed)
Patient had an episode of bleeding from catheter insertion site when sitting and standing up to go the bathroom only about 5 - 10 minutes after arriving back on unit. Patient instructed to lay back into bed and straighten leg. Pressure held with guaze and charge nurse notified. Jocelyn Lamer from [facility] paged and asked to come back up to the unit to assess. PAD applied by her at that time. 40 cc of air inserted with the instruction to leave fully inflated until shift change. After that time it can begin to be deflated based on protocol and patient tolerance. Will continue to monitor progress hourly. Wenda Low [facility]

## 2018-05-19 NOTE — Care Management (Signed)
Admitted with nstemi.  had PCI on 6/28 and there was concern for cva post procedure and sent to icu. Ruled out for cva.  Patient had second stenting today.  Patient to discharge on Brilinta. Patient   confirmed pharmacy coverage with insurance.  Provided with coupon for copay assist

## 2018-05-19 NOTE — Progress Notes (Signed)
[facility] NAME: Yariana Hoaglund    MR#:  [id number]  DATE OF BIRTH:  Jan 05, 1961  SUBJECTIVE:   No acute events overnight, no chest pain.  Plan for staged PCI with stent placement to the RCA today.  REVIEW OF SYSTEMS:    Review of Systems  Constitutional: Negative for chills and fever.  HENT: Negative for congestion and tinnitus.   Eyes: Negative for blurred vision and double vision.  Respiratory: Negative for cough, shortness of breath and wheezing.   Cardiovascular: Negative for chest pain, orthopnea and PND.  Gastrointestinal: Negative for abdominal pain, diarrhea, nausea and vomiting.  Genitourinary: Negative for dysuria and hematuria.  Neurological: Negative for dizziness, sensory change and focal weakness.  All other systems reviewed and are negative.   Nutrition: Heart Healthy Tolerating Diet: yes Tolerating PT: ambulatory  DRUG ALLERGIES:   Allergies  Allergen Reactions  . Amoxicillin Itching and Other (See Comments)    Reaction: bump in vaginal region Has patient had a PCN reaction causing immediate rash, facial/tongue/throat swelling, SOB or lightheadedness with hypotension: No Has patient had a PCN reaction causing severe rash involving mucus membranes or skin necrosis: No Has patient had a PCN reaction that required hospitalization: No Has patient had a PCN reaction occurring within the last 10 years: No If all of the above answers are "NO", then may proceed with Cephalosporin use.     VITALS:  Blood pressure (!) 147/89, pulse 66, temperature 98.4 F (36.9 C), temperature source Oral, resp. rate 14, height 5\' 3"  (1.6 m), weight 88.8 kg (195 lb 12.8 oz), SpO2 97 %.  PHYSICAL EXAMINATION:   Physical Exam  GENERAL:  57 y.o.-year-old patient lying in bed in no acute distress.  EYES: Pupils equal, round, reactive to light and accommodation. No scleral icterus. Extraocular muscles intact.  HEENT: Head  atraumatic, normocephalic. Oropharynx and nasopharynx clear.  NECK:  Supple, no jugular venous distention. No thyroid enlargement, no tenderness.  LUNGS: Normal breath sounds bilaterally, no wheezing, rales, rhonchi. No use of accessory muscles of respiration.  CARDIOVASCULAR: S1, S2 normal. No murmurs, rubs, or gallops.  ABDOMEN: Soft, nontender, nondistended. Bowel sounds present. No organomegaly or mass.  EXTREMITIES: No cyanosis, clubbing or edema b/l.    NEUROLOGIC: Cranial nerves II through XII are intact. No focal Motor or sensory deficits b/l.   PSYCHIATRIC: The patient is alert and oriented x 3.  SKIN: No obvious rash, lesion, or ulcer.    LABORATORY PANEL:   CBC Recent Labs  Lab 05/19/18 0947  WBC 4.9  HGB 13.7  HCT 42.4  PLT 215   ------------------------------------------------------------------------------------------------------------------  Chemistries  Recent Labs  Lab 05/17/18 0449  05/19/18 0947  NA 137   < > 139  K 3.0*   < > 3.3*  CL 104   < > 104  CO2 24   < > 28  GLUCOSE 95   < > 98  BUN 11   < > 17  CREATININE 0.80   < > 0.55  CALCIUM 8.5*   < > 9.0  MG 1.9  --   --    < > = values in this interval not displayed.   ------------------------------------------------------------------------------------------------------------------  Cardiac Enzymes Recent Labs  Lab 05/17/18 1006  TROPONINI 0.35*   ------------------------------------------------------------------------------------------------------------------  RADIOLOGY:  No results found.   ASSESSMENT AND PLAN:   57 year old female with past medical history of hypertension, asthma who presented to the hospital due to chest pain and  noted to have a non-ST elevation MI.  1.  Non-ST elevation MI- patient is status post cardiac catheterization with stent to the proximal LAD.  Patient also was also noted to have  A mid RCA lesion.   -Continue aspirin, Brilinta, Crestor,  carvedilol. -Currently chest pain-free and hemodynamically stable. - Status post staged PCI with drug-eluting stent to the mid RCA with no residual stenosis.  Reviewed cardiac catheterization results done this afternoon.  2.  Essential hypertension-continue losartan, HCTZ, carvedilol.  3.  Altered mental status-patient developed this post cardiac catheterization and code stroke was activated.  Patient underwent extensive neurologic work-up including CT of the head, CT angiogram of the head which was negative for acute pathology. - back to baseline now.   4.  Hyperlipidemia- Placed on Crestor as per Cards.  5.  Asthma-no acute exacerbation.  Likely discharge home tomorrow.   All the records are reviewed and case discussed with Care Management/Social Worker. Management plans discussed with the patient, family and they are in agreement.  CODE STATUS: Full code  DVT Prophylaxis: Lovenox  TOTAL TIME TAKING CARE OF THIS PATIENT: 25 minutes.   POSSIBLE D/C IN 1-2 DAYS, DEPENDING ON CLINICAL CONDITION.   Henreitta Leber M.D on 05/19/2018 at 3:08 PM  Between 7am to 6pm - Pager - [phone]  After 6pm go to [email or web address] - Proofreader  [facility]  Office  [phone]  CC: Primary care physician; System, Pcp Not In

## 2018-05-19 NOTE — Progress Notes (Signed)
Report called to Richardson Landry RN on telemetry with questions answered.

## 2018-05-19 NOTE — Plan of Care (Signed)
  Problem: Clinical Measurements: Goal: Ability to maintain clinical measurements within normal limits will improve Outcome: Not Progressing Note:  Potassium level = only 3.3 today. Will continue to monitor lab values. Wenda Low [facility]

## 2018-05-19 NOTE — Progress Notes (Signed)
I had been checking on patient PAD bandage since shift change. Patient bandage had old blood on it and some new blood.. I changed the bandage, patient was having some drainage. Will leave bandage on all night.

## 2018-05-20 ENCOUNTER — Telehealth: Payer: Self-pay | Admitting: *Deleted

## 2018-05-20 DIAGNOSIS — I10 Essential (primary) hypertension: Secondary | ICD-10-CM

## 2018-05-20 DIAGNOSIS — E785 Hyperlipidemia, unspecified: Secondary | ICD-10-CM

## 2018-05-20 LAB — BASIC METABOLIC PANEL
ANION GAP: 9 (ref 5–15)
BUN: 13 mg/dL (ref 6–20)
CHLORIDE: 103 mmol/L (ref 98–111)
CO2: 28 mmol/L (ref 22–32)
Calcium: 8.8 mg/dL — ABNORMAL LOW (ref 8.9–10.3)
Creatinine, Ser: 0.78 mg/dL (ref 0.44–1.00)
GFR calc Af Amer: >60 mL/min (ref >60)
GFR calc non Af Amer: >60 mL/min (ref >60)
GLUCOSE: 94 mg/dL (ref 70–99)
Potassium: 2.8 mmol/L — ABNORMAL LOW (ref 3.5–5.1)
Sodium: 140 mmol/L (ref 135–145)

## 2018-05-20 LAB — CBC
HEMATOCRIT: 39.9 % (ref 35.0–47.0)
HEMOGLOBIN: 13.1 g/dL (ref 12.0–16.0)
MCH: 27 pg (ref 26.0–34.0)
MCHC: 32.8 g/dL (ref 32.0–36.0)
MCV: 82.3 fL (ref 80.0–100.0)
Platelets: 209 10*3/uL (ref 150–440)
RBC: 4.85 MIL/uL (ref 3.80–5.20)
RDW: 14.6 % — ABNORMAL HIGH (ref 11.5–14.5)
WBC: 6 10*3/uL (ref 3.6–11.0)

## 2018-05-20 MED ORDER — ASPIRIN 81 MG PO TBEC: 81.0000 mg | DELAYED_RELEASE_TABLET | Freq: Every day | ORAL | 1 refills | Status: DC

## 2018-05-20 MED ORDER — CARVEDILOL 12.5 MG PO TABS: 12.5000 mg | ORAL_TABLET | Freq: Two times a day (BID) | ORAL | 1 refills | Status: DC

## 2018-05-20 MED ORDER — ROSUVASTATIN CALCIUM 20 MG PO TABS: 20.0000 mg | ORAL_TABLET | Freq: Every day | ORAL | 1 refills | Status: DC

## 2018-05-20 MED ORDER — TICAGRELOR 90 MG PO TABS: 90.0000 mg | ORAL_TABLET | Freq: Two times a day (BID) | ORAL | 1 refills | Status: AC

## 2018-05-20 MED ORDER — POTASSIUM CHLORIDE CRYS ER 20 MEQ PO TBCR: 40.0000 meq | EXTENDED_RELEASE_TABLET | Freq: Two times a day (BID) | ORAL | Status: DC

## 2018-05-20 NOTE — Progress Notes (Signed)
Progress Note  Patient Name: Kelsey Terry Date of Encounter: 05/20/2018  Primary Cardiologist: New to [facility] - consult by Arida  Subjective   No further chest pain. No SOB. Status post staged PCI to the RCA as detailed below. Has ambulated without issues. Labs stable. Vitals stable. Tolerating Crestor.   Inpatient Medications    Scheduled Meds: . aspirin EC  81 mg Oral Daily  . carvedilol  12.5 mg Oral BID WC  . docusate sodium  100 mg Oral BID  . enoxaparin (LOVENOX) injection  40 mg Subcutaneous Q24H  . losartan  100 mg Oral Daily   And  . hydrochlorothiazide  25 mg Oral Daily  . mupirocin ointment  1 application Nasal BID  . potassium chloride SA  20 mEq Oral Daily  . potassium chloride  40 mEq Oral BID  . rosuvastatin  20 mg Oral q1800  . sodium chloride flush  3 mL Intravenous Q12H  . sodium chloride flush  3 mL Intravenous Q12H  . ticagrelor  90 mg Oral BID   Continuous Infusions: . sodium chloride    . sodium chloride     PRN Meds: sodium chloride, sodium chloride, acetaminophen **OR** acetaminophen, hydrALAZINE, nitroGLYCERIN, ondansetron **OR** ondansetron (ZOFRAN) IV, sodium chloride flush, sodium chloride flush   Vital Signs    Vitals:   05/19/18 1515 05/19/18 2006 05/20/18 0457 05/20/18 0819  BP:  (!) 156/97 133/65 133/79  Pulse: 63 65 (!) 56 66  Resp: 11 17 17    Temp:  98.5 F (36.9 C) 98.4 F (36.9 C) 98.3 F (36.8 C)  TempSrc:  Oral Oral Oral  SpO2: 98% 100% 94% 99%  Weight:   194 lb 14.4 oz (88.4 kg)   Height:        Intake/Output Summary (Last 24 hours) at 05/20/2018 1057 Last data filed at 05/20/2018 0453 Gross per 24 hour  Intake 11.2 ml  Output 100 ml  Net -88.8 ml   Filed Weights   05/17/18 1057 05/19/18 0521 05/20/18 0457  Weight: 197 lb 8 oz (89.6 kg) 195 lb 12.8 oz (88.8 kg) 194 lb 14.4 oz (88.4 kg)    Telemetry    NSR - Personally Reviewed  ECG    n/a - Personally Reviewed  Physical Exam   GEN: No acute distress.     Neck: No JVD. Cardiac: RRR, no murmurs, rubs, or gallops. Right radial cath site and right femoral cath site without bleeding, bruising, swelling, erythema, warmth, or TTP. Radial pulse 2+. No femoral bruit.  Respiratory: Clear to auscultation bilaterally.  GI: Soft, nontender, non-distended.   MS: No edema; No deformity. Neuro:  Alert and oriented x 3; Nonfocal.  Psych: Normal affect.  Labs    Chemistry Recent Labs  Lab 05/17/18 1603 05/19/18 0947 05/20/18 0529  NA 138 139 140  K 3.7 3.3* 2.8*  CL 104 104 103  CO2 23 28 28   GLUCOSE 91 98 94  BUN 15 17 13   CREATININE 0.72 0.55 0.78  CALCIUM 9.2 9.0 8.8*  GFRNONAA >60 >60 >60  GFRAA >60 >60 >60  ANIONGAP 11 7 9      Hematology Recent Labs  Lab 05/17/18 0449 05/19/18 0947 05/20/18 0529  WBC 7.4 4.9 6.0  RBC 4.98 5.25* 4.85  HGB 13.3 13.7 13.1  HCT 40.7 42.4 39.9  MCV 81.7 80.7 82.3  MCH 26.8 26.2 27.0  MCHC 32.7 32.4 32.8  RDW 14.9* 14.9* 14.6*  PLT 225 215 209    Cardiac Enzymes Recent  Labs  Lab 05/16/18 1623 05/16/18 2305 05/17/18 0449 05/17/18 1006  TROPONINI 0.18* 0.30* 0.38* 0.35*   No results for input(s): TROPIPOC in the last 168 hours.   BNPNo results for input(s): BNP, PROBNP in the last 168 hours.   DDimer No results for input(s): DDIMER in the last 168 hours.   Radiology    No results found.  Cardiac Studies   LHC 05/19/2018: Conclusion     Previously placed Ost LAD to Prox LAD drug-eluting stents are widely patent.  Balloon angioplasty was performed.  Mid LAD lesion is 30% stenosed.  Ost 1st Diag lesion is 70% stenosed.  Ost Cx to Prox Cx lesion is 30% stenosed.  Dist RCA lesion is 30% stenosed.  Mid RCA lesion is 90% stenosed.  A drug-eluting stent was successfully placed using a STENT SIERRA 3.25 X 18 MM.  Post intervention, there is a 0% residual stenosis.   1. Successful angioplasty and drug-eluting stent placement to the mid right coronary artery. 2.  Vagal  reaction during closure device deployment responded well to atropine, IV fluids and Zofran.    Recommendations: Continue dual antiplatelet therapy for at least one year.  Aggressive treatment of risk factors. I started the patient on rosuvastatin given the possible side effect of atorvastatin. Right femoral artery angiography showed evidence of occluded SFA which seems to be chronic.  I will evaluate the patient for PAD as an outpatient.    LHC 05/16/2018: Conclusion     Mid RCA lesion is 90% stenosed.  Dist RCA lesion is 20% stenosed.  Ost Cx to Prox Cx lesion is 30% stenosed.  Ost LAD to Prox LAD lesion is 95% stenosed.  Post intervention, there is a 0% residual stenosis.  A drug-eluting stent was successfully placed using a STENT SIERRA 3.25 X 15 MM.  Mid LAD lesion is 30% stenosed.  Ost 1st Diag lesion is 70% stenosed.  A stent was successfully placed.  1. Severe two-vessel coronary artery disease. The culprit is 95% proximal LAD stenosis. There is also 90% mid RCA stenosis. 2. Left ventricular angiography could not be performed due to difficulty advancing the pigtail catheter as a result of severe radial artery spasm. Obtain an echocardiogram.  3. Successful angioplasty and drug-eluting stent placement to the proximal LAD.  Recommendations: Dual antiplatelet therapy for at least one year. Blood pressure control and aggressive treatment of risk factors. Staged RCA PCI on Monday. I might consider the femoral artery approach given radial artery spasm.    TTE 05/16/2018: Study Conclusions  - Left ventricle: The cavity size was normal. There was mild concentric hypertrophy. Systolic function was normal. The estimated ejection fraction was in the range of 55% to 60%. Wall motion was normal; there were no regional wall motion abnormalities. Doppler parameters are consistent with abnormal left ventricular relaxation (grade 1 diastolic  dysfunction). - Aortic valve: There was trivial regurgitation. - Mitral valve: Calcified annulus. There was mild regurgitation. - Left atrium: The atrium was mildly dilated.   Patient Profile     58 y.o. female with history of pericarditis s/p pericardiocentesis ~ 7-8 years prior in [place], HTN, asthma, and lymphedemawho is being seen today for the evaluation of NSTEMI.  Assessment & Plan    1. NSTEMI: -Currently chest pain free -DAPT with ASA and Brilinta without interruption for at least the next 12 months -Status post PCI to the LAD on 6/28 followed by staged PCI to the RCA on 7/1 -NPO -Aggressive secondary prevention  -Coreg -Changed  from Lipitor to Crestor  -Cardiac rehab -TTE as above -Post cath instructions  2. Confusion: -Resolved, back to baseline -CT/CTA head normal -Precedex off -No evidence of peri-procedural stroke  3. Headache: -Stop Lipitor and see how she feels -Not on any nitro  4. HTN: -Poorly controlled -Possibly exacerbating her headache -Increase Coreg to 12.5 mg bid -Continue losartan 100 mg and HCTZ 25 mg daily  5. HLD: -Lipitor changed to Crestor -Goal LDL < 70 -May need to consider alternative statin vs PCSK-9 inhibitor    For questions or updates, please contact [facility] Please consult [email or web address] for contact info under Cardiology/STEMI.    Signed, Christell Faith, PA-C [place] Pager: [phone] 05/20/2018, 10:57 AM

## 2018-05-20 NOTE — Care Management (Signed)
Received telephone call from [facility]. States that Kelsey Terry has called back to hospital to say she has no drug plan for her medications. Can't afford the Brilinta. Telephone call to Kelsey Terry [phone]). Discussed Medication Management as a resource for medications. States she would follow-up with Medication Management in the morning, Shelbie Ammons RN MSN [facility] [phone]

## 2018-05-20 NOTE — Telephone Encounter (Signed)
Patient currently admitted. TCM call- 7/3.

## 2018-05-20 NOTE — Telephone Encounter (Signed)
-----   Message from Blain Pais sent at 05/20/2018 10:43 AM EDT ----- Regarding: tcm/ph 7/9 2pm Christell Faith, PA

## 2018-05-20 NOTE — Discharge Summary (Signed)
[facility] [place] NAME: Kelsey Terry    MR#:  [id number]  DATE OF BIRTH:  [birth year]  DATE OF ADMISSION:  05/15/2018 ADMITTING PHYSICIAN: Harrie Foreman, MD  DATE OF DISCHARGE: 05/20/2018 11:42 AM  PRIMARY CARE PHYSICIAN: System, Pcp Not In    ADMISSION DIAGNOSIS:  NSTEMI (non-ST elevated myocardial infarction) ([place]) [I21.4]  DISCHARGE DIAGNOSIS:  Active Problems:   NSTEMI (non-ST elevated myocardial infarction) ([place])   SECONDARY DIAGNOSIS:   Past Medical History:  Diagnosis Date  . Asthma   . Hypertension   . Lymphedema    LLE  . Pericarditis    a. ~ 2012 in [place], [place] s/p pericardiocentesis     HOSPITAL COURSE:   57 year old female with past medical history of hypertension, asthma who presented to the hospital due to chest pain and noted to have a non-ST elevation MI.  1.  Non-ST elevation MI- and presented with chest pain and ruled in based on her cardiac markers. -Seen by cardiology and status post cardiac catheterization and initially underwent stent to the proximal LAD.  Given complicated and high risk PCI and stenting she underwent repeat cardiac catheterization few days later with stenting to the mid RCA. - She is currently chest pain-free and hemodynamically stable.  She is being discharged on aspirin, Brilinta, Crestor, carvedilol, losartan/HCTZ and will follow-up with cardiology within the next week to 2 weeks.  2.  Essential hypertension- she will continue losartan/HCTZ, carvedilol.  3.  Altered mental status-patient developed this post cardiac catheterization and code stroke was activated.  Patient underwent extensive neurologic work-up including CT of the head, CT angiogram of the head which was negative for acute pathology. - back to baseline now.   4.  Hyperlipidemia- cont. Crestor.  5.  Asthma-no acute exacerbation while in the hospital    DISCHARGE CONDITIONS:   Stable  CONSULTS OBTAINED:   Treatment Team:  Wellington Hampshire, MD Catarina Hartshorn, MD  DRUG ALLERGIES:   Allergies  Allergen Reactions  . Amoxicillin Itching and Other (See Comments)    Reaction: bump in vaginal region Has patient had a PCN reaction causing immediate rash, facial/tongue/throat swelling, SOB or lightheadedness with hypotension: No Has patient had a PCN reaction causing severe rash involving mucus membranes or skin necrosis: No Has patient had a PCN reaction that required hospitalization: No Has patient had a PCN reaction occurring within the last 10 years: No If all of the above answers are "NO", then may proceed with Cephalosporin use.     DISCHARGE MEDICATIONS:   Allergies as of 05/20/2018      Reactions   Amoxicillin Itching, Other (See Comments)   Reaction: bump in vaginal region Has patient had a PCN reaction causing immediate rash, facial/tongue/throat swelling, SOB or lightheadedness with hypotension: No Has patient had a PCN reaction causing severe rash involving mucus membranes or skin necrosis: No Has patient had a PCN reaction that required hospitalization: No Has patient had a PCN reaction occurring within the last 10 years: No If all of the above answers are "NO", then may proceed with Cephalosporin use.      Medication List    TAKE these medications   aspirin 81 MG EC tablet Take 1 tablet (81 mg total) by mouth daily. Start taking on:  05/21/2018   carvedilol 12.5 MG tablet Commonly known as:  COREG Take 1 tablet (12.5 mg total) by mouth 2 (two) times daily with a meal.   losartan-hydrochlorothiazide 100-25 MG  tablet Commonly known as:  HYZAAR Take 1 tablet by mouth daily.   potassium chloride 10 MEQ tablet Commonly known as:  K-DUR Take 20 mEq by mouth daily.   rosuvastatin 20 MG tablet Commonly known as:  CRESTOR Take 1 tablet (20 mg total) by mouth daily at 6 PM.   ticagrelor 90 MG Tabs tablet Commonly known as:  BRILINTA Take 1 tablet (90 mg total) by  mouth 2 (two) times daily.         DISCHARGE INSTRUCTIONS:   DIET:  Cardiac diet  DISCHARGE CONDITION:  Stable  ACTIVITY:  Activity as tolerated  OXYGEN:  Home Oxygen: No.   Oxygen Delivery: room air  DISCHARGE LOCATION:  home   If you experience worsening of your admission symptoms, develop shortness of breath, life threatening emergency, suicidal or homicidal thoughts you must seek medical attention immediately by calling 911 or calling your MD immediately  if symptoms less severe.  You Must read complete instructions/literature along with all the possible adverse reactions/side effects for all the Medicines you take and that have been prescribed to you. Take any new Medicines after you have completely understood and accpet all the possible adverse reactions/side effects.   Please note  You were cared for by a hospitalist during your hospital stay. If you have any questions about your discharge medications or the care you received while you were in the hospital after you are discharged, you can call the unit and asked to speak with the hospitalist on call if the hospitalist that took care of you is not available. Once you are discharged, your primary care physician will handle any further medical issues. Please note that NO REFILLS for any discharge medications will be authorized once you are discharged, as it is imperative that you return to your primary care physician (or establish a relationship with a primary care physician if you do not have one) for your aftercare needs so that they can reassess your need for medications and monitor your lab values.     Today   No acute events overnight, no chest pain, shortness of breath.  Will discharge home today.  VITAL SIGNS:  Blood pressure 133/79, pulse 66, temperature 98.3 F (36.8 C), temperature source Oral, resp. rate 17, height 5\' 3"  (1.6 m), weight 88.4 kg (194 lb 14.4 oz), SpO2 99 %.  I/O:    Intake/Output Summary  (Last 24 hours) at 05/20/2018 1653 Last data filed at 05/20/2018 0453 Gross per 24 hour  Intake 11.2 ml  Output 0 ml  Net 11.2 ml    PHYSICAL EXAMINATION:  GENERAL:  57 y.o.-year-old patient lying in the bed with no acute distress.  EYES: Pupils equal, round, reactive to light and accommodation. No scleral icterus. Extraocular muscles intact.  HEENT: Head atraumatic, normocephalic. Oropharynx and nasopharynx clear.  NECK:  Supple, no jugular venous distention. No thyroid enlargement, no tenderness.  LUNGS: Normal breath sounds bilaterally, no wheezing, rales,rhonchi. No use of accessory muscles of respiration.  CARDIOVASCULAR: S1, S2 normal. No murmurs, rubs, or gallops.  ABDOMEN: Soft, non-tender, non-distended. Bowel sounds present. No organomegaly or mass.  EXTREMITIES: No pedal edema, cyanosis, or clubbing.  NEUROLOGIC: Cranial nerves II through XII are intact. No focal motor or sensory defecits b/l.  PSYCHIATRIC: The patient is alert and oriented x 3. SKIN: No obvious rash, lesion, or ulcer.   DATA REVIEW:   CBC Recent Labs  Lab 05/20/18 0529  WBC 6.0  HGB 13.1  HCT 39.9  PLT 209  Chemistries  Recent Labs  Lab 05/17/18 0449  05/20/18 0529  NA 137   < > 140  K 3.0*   < > 2.8*  CL 104   < > 103  CO2 24   < > 28  GLUCOSE 95   < > 94  BUN 11   < > 13  CREATININE 0.80   < > 0.78  CALCIUM 8.5*   < > 8.8*  MG 1.9  --   --    < > = values in this interval not displayed.    Cardiac Enzymes Recent Labs  Lab 05/17/18 1006  TROPONINI 0.35*    Microbiology Results  Results for orders placed or performed during the hospital encounter of 05/15/18  MRSA PCR Screening     Status: Abnormal   Collection Time: 05/16/18  3:46 PM  Result Value Ref Range Status   MRSA by PCR POSITIVE (A) NEGATIVE Final    Comment:        The GeneXpert MRSA Assay (FDA approved for NASAL specimens only), is one component of a comprehensive MRSA colonization surveillance program. It is  not intended to diagnose MRSA infection nor to guide or monitor treatment for MRSA infections. RESULT CALLED TO, READ BACK BY AND VERIFIED WITH: CHERYL PATEL 05/16/18 AT 2458 BY HS Performed at [facility], [address] [place], [place] [address]     RADIOLOGY:  No results found.    Management plans discussed with the patient, family and they are in agreement.  CODE STATUS:  Code Status History    Date Active Date Inactive Code Status Order ID Comments User Context   05/16/2018 0123 05/20/2018 1442 Full Code [id number]  Harrie Foreman, MD Inpatient      TOTAL TIME TAKING CARE OF THIS PATIENT: 40 minutes.    Henreitta Leber M.D on 05/20/2018 at 4:53 PM  Between 7am to 6pm - Pager - [phone]  After 6pm go to [email or web address] - Proofreader  [facility]  Office  [phone]  CC: Primary care physician; System, Pcp Not In

## 2018-05-20 NOTE — Progress Notes (Signed)
Patient alert and oriented, vss, no complaints of pain.  F/U appt made.  Went over discharge instructions r/t angiogram and new medications.  Patient able to repeat back information.  Daughter present at bedside.  Patient to be escorted out of hospital via wheelchair by volunteers.

## 2018-05-20 NOTE — Progress Notes (Signed)
Deflated PAD by 20, no signs of bleeding.  Will continue to monitor.

## 2018-05-21 NOTE — Telephone Encounter (Signed)
Probably vasovagal syncope. She cannot miss any dose of Brilinta.  Please give her samples for now if she does not have any.  She is not able to afford this we can switch to Plavix 75 mg daily with a 300 mg loading dose.  I think she does have commercial insurance and her co-pay should not be more than $10 with a co-pay card.

## 2018-05-21 NOTE — Telephone Encounter (Signed)
Patient contacted regarding discharge from [facility] on Tuesday 05/20/18.  Patient understands to follow up with provider  on Tuesday 05/27/18 at 2:00 pm at the [place] office. Patient understands discharge instructions? Yes Patient understands medications and regiment? Yes Patient understands to bring all medications to this visit? Yes  The patient called earlier today to say she could not afford Brilinta. I called and offered the $5 co-pay card from the company. Per the patient, she did try to apply the card, but the cost of the drug was still going to be over $300/ month. She states she was able to obtain this today through what sounds like [facility] so she has the Brilinta in hand.  She also reports that she passed out this morning.  She had taken a shower and gone to the bedroom to get her clothes- she bent over to do so. She started to black out and called her son who came to help her. She was very diaphoretic at that time per her report.  She has felt "drained" for most of the day.  She did not feel dizzy/ lightheaded in the shower today. She has taken her prescribed medications today. She has not taken her BP/ HR at home.  She denies any other symptoms at this time.  I advised her I will forward to Dr.Arida/ APP to review and we will call her back with any further recommendations regarding her episode this morning.

## 2018-05-21 NOTE — Telephone Encounter (Signed)
Please clarify if the patient had any symptoms concerning for angina with this episode. How has her BP/HR been since? Is she currently having any symptoms? If so, would recommend evaluation in the ED. She was not noted to have hypotension while admitted. Sounds like some of this may have been vagal mediated from her shower vs positional given the history.   Perhaps she will be able to get her Brilinta through [facility] moving forward, if not, we will need to change to an alternative antiplatelet. This can be assessed at her follow up.

## 2018-05-21 NOTE — Telephone Encounter (Signed)
I spoke with the patient. She is aware of that Dr. Fletcher Anon has reviewed what happened to her this morning and of his comments.  She does currently have Brilinta- will re-address cost at her follow up appointment on 05/27/18.

## 2018-05-21 NOTE — Telephone Encounter (Signed)
Pt calling stating she was just discharged  But is calling stating the Brilinta that we prescribed  She is not able to afford it  Would like a call back around this   Please advise

## 2018-05-21 NOTE — Telephone Encounter (Signed)
I had already asked about those things- sorry I didn't clarify. She denied any chest pain today. She has not taken any vital signs today. I had asked how she has been doing the rest of the day, she just said she felt "drained."

## 2018-05-23 ENCOUNTER — Encounter: Payer: Self-pay | Admitting: Physician Assistant

## 2018-05-23 NOTE — Progress Notes (Signed)
Cardiology Office Note Date:  05/27/2018  Patient ID:  Kelsey Terry, DOB 08/12/1961, MRN [id number] PCP:  Patient, No Pcp Per  Cardiologist:  Dr. Fletcher Anon, MD    Chief Complaint: Hospital follow up  History of Present Illness: Kelsey Terry is a 57 y.o. female with history of CAD s/p recent NSTEMI as detailed below, pericarditis s/p pericardiocentesis ~ 7-8 years prior in [place], diastolic dysfunction, HTN, asthma, and lymphedema who presents for hospital follow up after recent admission to [facility] from 6/28-7/2 for a NSTEMI.   Patient was previously followed by cardiology in [place]. She reports being admitted to [facility] in [place] ~ 7-8 years prior with substernal chest pain with workup revealing pericarditis. She underwent successful pericardiocentesis. She has relocated to [place] to be closer to her daughter. She was admitted to [facility] 6/28 with severe, substernal chest pain and SOB. She was found to have a NSTEMI with peak troponin of 0.38. EKG showed anterolateral st changes, not meeting stemi criteria. She underwent LHC on 05/16/2018 that showed severe two-vessel CAD with a ostial to proximal LAD 95%, mid LAD 30%, ostial D1 70%, ostial to proximal LCx 30%, mid RCA 90%, distal RCA 20%. She underwent successful PCI/DES to the ostial to proximal LAD with 0% residual stenosis. Following the cath, she complained of confusion/slurred speech. CTA head was not acute. CTA head could not be completed due lack of cooperation and persistent nausea/vomiting. Neurology evaluated the patient and stroke was felt to be less likely. Echo showed an EF of 55-60%, no RWMA, Gr1DD, trivial AI, calcified mitral annulus, mild MR, mildly dilated LA. Carotid ultrasound showed mild bilateral carotid stenosis of < 50%. She underwent staged PCI of the mid RCA on 7/1 with 0% residual stenosis. During this staged procedure (right femoral artery access was obtained given vasospasm noted with right radial artery access during  her initial cath on 6/28) she was noted to have evidence of an occluded SFA, which seemed to be chronic with outpatient evaluation being recommended. During closure device deployment she was noted to have had a vagal reaction that responded well to atropine, IV fluids, and Zofran. She was changed from Lipitor to Crestor given concerns for possible headache. She called our office on 7/2 after arriving home and noted concerns with the cost of Brilinta, though was able to obtain this through [facility]. She also noted a possible syncopal episode following a shower and positional change. This was felt to possibly be vasovagal in etiology.    She comes in accompanied by her son today.  She is doing well from a cardiac perspective and has not had any further chest pain or shortness of breath.  She has not had any further syncopal episodes as above.  She is tolerating all medications without issues and has not missed any doses of aspirin or Brilinta.  No BRBPR, melena, hemoptysis or hematemesis.  She continues to process her above recent hospitalization and feels like she is somewhat of a burden to her children though is in good spirits.  She continues to feel deconditioned though is improving on a daily basis.  She is interested in cardiac rehab.  No issues from a cardiac catheterization site outside of mild soreness.  Past Medical History:  Diagnosis Date  . Asthma   . CAD in native artery    a. NSTEMI 05/16/18: [facility] 05/16/18 - ost-pLAD 95% s/p PCI/DES, mLAD 30%, ostD1 70%, ost-pLCx 30%, mRCA 90%, dRCA 20%; b. staged PCI/DES to [facility] 7/1  .  Diastolic dysfunction    a. TTE 05/16/18: EF 55-60%, no RWMA, Gr1DD, trivial AI, calcified mitral annulus, mild MR, mildly dilated LA  . Hypertension   . Lymphedema    LLE  . Pericarditis    a. ~ 2012 in [place], [place] s/p pericardiocentesis     Past Surgical History:  Procedure Laterality Date  . CESAREAN SECTION    . CORONARY STENT INTERVENTION N/A  05/16/2018   Procedure: CORONARY STENT INTERVENTION;  Surgeon: Wellington Hampshire, MD;  Location: [place] [facility];  Service: Cardiovascular;  Laterality: N/A;  . CORONARY STENT INTERVENTION N/A 05/19/2018   Procedure: CORONARY STENT INTERVENTION;  Surgeon: Wellington Hampshire, MD;  Location: [place] [facility];  Service: Cardiovascular;  Laterality: N/A;  . LEFT HEART CATH AND CORONARY ANGIOGRAPHY N/A 05/16/2018   Procedure: LEFT HEART CATH AND CORONARY ANGIOGRAPHY;  Surgeon: Wellington Hampshire, MD;  Location: [place] [facility];  Service: Cardiovascular;  Laterality: N/A;  . PERICARDIOCENTESIS      Current Meds  Medication Sig  . aspirin EC 81 MG EC tablet Take 1 tablet (81 mg total) by mouth daily.  . carvedilol (COREG) 12.5 MG tablet Take 1 tablet (12.5 mg total) by mouth 2 (two) times daily with a meal.  . losartan-hydrochlorothiazide (HYZAAR) 100-25 MG tablet Take 1 tablet by mouth daily.  . potassium chloride (K-DUR) 10 MEQ tablet Take 20 mEq by mouth daily.  . rosuvastatin (CRESTOR) 20 MG tablet Take 1 tablet (20 mg total) by mouth daily at 6 PM.  . ticagrelor (BRILINTA) 90 MG TABS tablet Take 1 tablet (90 mg total) by mouth 2 (two) times daily.    Allergies:   Amoxicillin   Social History:  The patient  reports that she has never smoked. She has never used smokeless tobacco. She reports that she drank alcohol. She reports that she does not use drugs.   Family History:  The patient's family history includes Hypertension in her mother; Pancreatic cancer in her father.  ROS:   Review of Systems  Constitutional: Positive for malaise/fatigue. Negative for chills, diaphoresis, fever and weight loss.  HENT: Negative for congestion.   Eyes: Negative for discharge and redness.  Respiratory: Negative for cough, hemoptysis, sputum production, shortness of breath and wheezing.   Cardiovascular: Negative for chest pain, palpitations, orthopnea, claudication, leg swelling and PND.    Gastrointestinal: Negative for abdominal pain, blood in stool, heartburn, melena, nausea and vomiting.  Genitourinary: Negative for hematuria.  Musculoskeletal: Negative for falls and myalgias.  Skin: Negative for rash.  Neurological: Positive for loss of consciousness and weakness. Negative for dizziness, tingling, tremors, sensory change, speech change and focal weakness.  Endo/Heme/Allergies: Does not bruise/bleed easily.  Psychiatric/Behavioral: Negative for substance abuse. The patient is not nervous/anxious.   All other systems reviewed and are negative.    PHYSICAL EXAM:  VS:  BP 124/78 (BP Location: Right Arm, Patient Position: Sitting, Cuff Size: Large)   Pulse 68   Ht 5\' 3"  (1.6 m)   Wt 200 lb 8 oz (90.9 kg)   BMI 35.52 kg/m  BMI: Body mass index is 35.52 kg/m.  Physical Exam  Constitutional: She is oriented to person, place, and time. She appears well-developed and well-nourished.  HENT:  Head: Normocephalic and atraumatic.  Eyes: Right eye exhibits no discharge. Left eye exhibits no discharge.  Neck: Normal range of motion. No JVD present.  Cardiovascular: Normal rate, regular rhythm, S1 normal, S2 normal and normal heart sounds. Exam reveals no distant  heart sounds, no friction rub, no midsystolic click and no opening snap.  No murmur heard. Pulses:      Posterior tibial pulses are 2+ on the right side, and 2+ on the left side.  Right radial cardiac catheterization site is healing well without active bleeding, bruising, swelling, erythema, warmth, or tenderness to palpation.  Right radial pulse 2+.  Right femoral cardiac catheterization site is well-healing without active bleeding, bruising, swelling, erythema, warmth, or tenderness to palpation.  No bruit.  Pulmonary/Chest: Effort normal and breath sounds normal. No respiratory distress. She has no decreased breath sounds. She has no wheezes. She has no rales. She exhibits no tenderness.  Abdominal: Soft. She exhibits  no distension. There is no tenderness.  Musculoskeletal: She exhibits no edema.  Neurological: She is alert and oriented to person, place, and time.  Skin: Skin is warm and dry. No cyanosis. Nails show no clubbing.  Psychiatric: She has a normal mood and affect. Her speech is normal and behavior is normal. Judgment and thought content normal.     EKG:  Was ordered and interpreted by me today. Shows NSR, 67 bpm, nonspecific ST-T changes leads I, aVL, V3, V4, V5, V6  Recent Labs: 05/16/2018: TSH 1.115 05/17/2018: Magnesium 1.9 05/20/2018: BUN 13; Creatinine, Ser 0.78; Hemoglobin 13.1; Platelets 209; Potassium 2.8; Sodium 140  05/16/2018: Cholesterol 232; HDL 55; LDL Cholesterol 168; Total CHOL/HDL Ratio 4.2; Triglycerides 43; VLDL 9   Estimated Creatinine Clearance: 82 mL/min (by C-G formula based on SCr of 0.78 mg/dL).   Wt Readings from Last 3 Encounters:  05/27/18 200 lb 8 oz (90.9 kg)  05/20/18 194 lb 14.4 oz (88.4 kg)    Orthostatic Vital Signs: Lying: 120/71, 69 bpm Sitting: 120/74, 69 bpm Standing: 115/72, 77 bpm Standing x3 minutes: 113/72, 74 bpm  Other studies reviewed: Additional studies/records reviewed today include: summarized above  ASSESSMENT AND PLAN:  1. CAD of the native coronary arteries without angina: Currently without symptoms concerning for angina.  Continue dual antiplatelet therapy with aspirin 81 mg daily and Brilinta 90 mg twice daily for at least the next 12 months without interruption.  Aggressive risk factor modification and secondary prevention.  Continue carvedilol 12.5 mg twice daily, Hyzaar, and Crestor.  Refer to cardiac rehab.  Overall she continues to process her recent non-STEMI though is in good spirits.  Hopefully some of the support she will receive through cardiac rehab will help her in the long run.  Family appears to be very supportive.  2. Syncope: No further episodes.  Felt to be vasovagal in etiology.  No further work-up at this  time.  3. Essential hypertension: Blood pressure well controlled.  Continue carvedilol and Hyzaar.  4. Hyperlipidemia: Seems to be tolerating Crestor 20 mg daily.  Goal LDL less than 70.  Recheck lipid and liver function in approximately 8 weeks.  If not at goal at that time recommend addition of Zetia.  If still not at goal she may require PCSK9 inhibitor.  Disposition: F/u with Dr. Fletcher Anon in 1 month.  Current medicines are reviewed at length with the patient today.  The patient did not have any concerns regarding medicines.  Signed, Christell Faith, PA-C 05/27/2018 2:09 PM     [address] [place], [place] [address] [phone]

## 2018-05-26 ENCOUNTER — Telehealth: Payer: Self-pay

## 2018-05-26 NOTE — Telephone Encounter (Signed)
Flagged on EMMI report for having other questions/problems.  Called and spoke with patient.  She mentioned she hasn't been able to fill her Brilinta prescription yet, but is working on it.  She states she has an appointment with [facility] tomorrow to go over paperwork to establish with them.  She does not have a current PCP.  I asked if she was familiar with [facility] and she said she wasn't as she's from up [place] and new to the area.  I explained [facility]'s services and confirmed that she is an [facility] resident.  Explained that [facility] and [facility] share dual application process and that items turned into [facility] also count towards establishing with [facility]. I mentioned that [facility] would need an utility bill in her daughter's name as she's living with her currently and went over the letter or support form she would need to complete and have notarized (mentioned she could have it notarized at [facility] or [facility], as well as out in the community if she knew a notary).  Mrs. Balazs does not have a [facility] ID yet.  Explained it would be needed to establish with [facility] and that we could accept the temporary card given until permanent one arrives.    Patient interested in receiving information about [facility].  I confirmed address ( [address], [place], [place] [address]) to send information to and will work on getting in the mail to her this week.    Mrs Mumpower expressed appreciation for care received during her stay.  I thanked her for her time and her feedback.

## 2018-05-27 ENCOUNTER — Ambulatory Visit (INDEPENDENT_AMBULATORY_CARE_PROVIDER_SITE_OTHER): Payer: Self-pay | Admitting: Physician Assistant

## 2018-05-27 ENCOUNTER — Encounter: Payer: Self-pay | Admitting: Physician Assistant

## 2018-05-27 VITALS — BP 124/78 | HR 68 | Ht 63.0 in | Wt 200.5 lb

## 2018-05-27 DIAGNOSIS — Z79899 Other long term (current) drug therapy: Secondary | ICD-10-CM

## 2018-05-27 DIAGNOSIS — E785 Hyperlipidemia, unspecified: Secondary | ICD-10-CM

## 2018-05-27 DIAGNOSIS — R55 Syncope and collapse: Secondary | ICD-10-CM

## 2018-05-27 DIAGNOSIS — I214 Non-ST elevation (NSTEMI) myocardial infarction: Secondary | ICD-10-CM

## 2018-05-27 DIAGNOSIS — I10 Essential (primary) hypertension: Secondary | ICD-10-CM

## 2018-05-27 DIAGNOSIS — I2510 Atherosclerotic heart disease of native coronary artery without angina pectoris: Secondary | ICD-10-CM

## 2018-05-27 NOTE — Patient Instructions (Signed)
Medication Instructions:  Your physician recommends that you continue on your current medications as directed. Please refer to the Current Medication list given to you today.   Labwork: Your physician recommends that you return for lab work in: TODAY (BMET, CBC).   Testing/Procedures: none  Follow-Up: You have been referred to [facility]. If you have not heard anything in about 1 week give them a call.  Your physician recommends that you schedule a follow-up appointment in: [place].  If you need a refill on your cardiac medications before your next appointment, please call your pharmacy.

## 2018-05-28 LAB — CBC WITH DIFFERENTIAL/PLATELET
BASOS ABS: 0.1 10*3/uL (ref 0.0–0.2)
Basos: 1 %
EOS (ABSOLUTE): 0.1 10*3/uL (ref 0.0–0.4)
Eos: 1 %
HEMOGLOBIN: 12.3 g/dL (ref 11.1–15.9)
Hematocrit: 38.3 % (ref 34.0–46.6)
IMMATURE GRANS (ABS): 0 10*3/uL (ref 0.0–0.1)
IMMATURE GRANULOCYTES: 0 %
LYMPHS: 32 %
Lymphocytes Absolute: 1.8 10*3/uL (ref 0.7–3.1)
MCH: 26.1 pg — ABNORMAL LOW (ref 26.6–33.0)
MCHC: 32.1 g/dL (ref 31.5–35.7)
MCV: 81 fL (ref 79–97)
MONOCYTES: 12 %
Monocytes Absolute: 0.7 10*3/uL (ref 0.1–0.9)
NEUTROS PCT: 54 %
Neutrophils Absolute: 3.2 10*3/uL (ref 1.4–7.0)
PLATELETS: 279 10*3/uL (ref 150–450)
RBC: 4.71 x10E6/uL (ref 3.77–5.28)
RDW: 13.7 % (ref 12.3–15.4)
WBC: 5.9 10*3/uL (ref 3.4–10.8)

## 2018-05-28 LAB — BASIC METABOLIC PANEL
BUN/Creatinine Ratio: 15 (ref 9–23)
BUN: 15 mg/dL (ref 6–24)
CHLORIDE: 106 mmol/L (ref 96–106)
CO2: 28 mmol/L (ref 20–29)
Calcium: 9.4 mg/dL (ref 8.7–10.2)
Creatinine, Ser: 1.01 mg/dL — ABNORMAL HIGH (ref 0.57–1.00)
GFR calc Af Amer: 71 mL/min/{1.73_m2} (ref >59)
GFR, EST NON AFRICAN AMERICAN: 62 mL/min/{1.73_m2} (ref >59)
GLUCOSE: 94 mg/dL (ref 65–99)
POTASSIUM: 4.1 mmol/L (ref 3.5–5.2)
Sodium: 146 mmol/L — ABNORMAL HIGH (ref 134–144)

## 2018-05-29 ENCOUNTER — Other Ambulatory Visit: Payer: Self-pay | Admitting: *Deleted

## 2018-05-29 MED ORDER — LOSARTAN POTASSIUM-HCTZ 100-12.5 MG PO TABS: 1.0000 | ORAL_TABLET | Freq: Every day | ORAL | 3 refills | Status: DC

## 2018-06-02 ENCOUNTER — Ambulatory Visit: Payer: Medicaid Other | Admitting: Pharmacy Technician

## 2018-06-02 DIAGNOSIS — Z79899 Other long term (current) drug therapy: Secondary | ICD-10-CM

## 2018-06-09 ENCOUNTER — Encounter: Payer: Medicaid Other | Attending: Cardiovascular Disease | Admitting: *Deleted

## 2018-06-09 VITALS — Ht 63.6 in | Wt 202.2 lb

## 2018-06-09 DIAGNOSIS — Z7982 Long term (current) use of aspirin: Secondary | ICD-10-CM | POA: Insufficient documentation

## 2018-06-09 DIAGNOSIS — I214 Non-ST elevation (NSTEMI) myocardial infarction: Secondary | ICD-10-CM | POA: Insufficient documentation

## 2018-06-09 DIAGNOSIS — I890 Lymphedema, not elsewhere classified: Secondary | ICD-10-CM | POA: Insufficient documentation

## 2018-06-09 DIAGNOSIS — Z955 Presence of coronary angioplasty implant and graft: Secondary | ICD-10-CM | POA: Insufficient documentation

## 2018-06-09 DIAGNOSIS — I10 Essential (primary) hypertension: Secondary | ICD-10-CM | POA: Insufficient documentation

## 2018-06-09 DIAGNOSIS — I2510 Atherosclerotic heart disease of native coronary artery without angina pectoris: Secondary | ICD-10-CM | POA: Insufficient documentation

## 2018-06-09 DIAGNOSIS — Z79899 Other long term (current) drug therapy: Secondary | ICD-10-CM | POA: Insufficient documentation

## 2018-06-09 NOTE — Progress Notes (Signed)
Cardiac Individual Treatment Plan  Patient Details  Name: Kelsey Terry MRN: [id number] Date of Birth: 1961-08-24 Referring Provider:     Cardiac Rehab from 06/09/2018 in [facility] Cardiac and Pulmonary Rehab  Referring Provider  Kathlyn Sacramento MD      Initial Encounter Date:    Cardiac Rehab from 06/09/2018 in [facility] Cardiac and Pulmonary Rehab  Date  06/09/18      Visit Diagnosis: NSTEMI (non-ST elevated myocardial infarction) [facility])  Status post coronary artery stent placement  Patient's Home Medications on Admission:  Current Outpatient Medications:  .  aspirin EC 81 MG EC tablet, Take 1 tablet (81 mg total) by mouth daily., Disp: 30 tablet, Rfl: 1 .  carvedilol (COREG) 12.5 MG tablet, Take 1 tablet (12.5 mg total) by mouth 2 (two) times daily with a meal., Disp: 60 tablet, Rfl: 1 .  losartan-hydrochlorothiazide (HYZAAR) 100-12.5 MG tablet, Take 1 tablet by mouth daily., Disp: 90 tablet, Rfl: 3 .  potassium chloride (K-DUR) 10 MEQ tablet, Take 20 mEq by mouth daily., Disp: , Rfl:  .  rosuvastatin (CRESTOR) 20 MG tablet, Take 1 tablet (20 mg total) by mouth daily at 6 PM., Disp: 30 tablet, Rfl: 1 .  ticagrelor (BRILINTA) 90 MG TABS tablet, Take 1 tablet (90 mg total) by mouth 2 (two) times daily., Disp: 60 tablet, Rfl: 1  Past Medical History: Past Medical History:  Diagnosis Date  . Asthma   . CAD in native artery    a. NSTEMI 05/16/18: [place] 05/16/18 - ost-pLAD 95% s/p PCI/DES, mLAD 30%, ostD1 70%, ost-pLCx 30%, mRCA 90%, dRCA 20%; b. staged PCI/DES to [facility] 7/1  . Diastolic dysfunction    a. TTE 05/16/18: EF 55-60%, no RWMA, Gr1DD, trivial AI, calcified mitral annulus, mild MR, mildly dilated LA  . Hypertension   . Lymphedema    LLE  . Pericarditis    a. ~ 2012 in [place], [place] s/p pericardiocentesis     Tobacco Use: Social History   Tobacco Use  Smoking Status Never Smoker  Smokeless Tobacco Never Used    Labs: Recent Review Scientist, physiological    Labs for ITP Cardiac and  Pulmonary Rehab Latest Ref Rng & Units 05/16/2018   Cholestrol 0 - 200 mg/dL 232(H)   LDLCALC 0 - 99 mg/dL 168(H)   HDL >40 mg/dL 55   Trlycerides <150 mg/dL 43   Hemoglobin A1c 4.8 - 5.6 % 5.7(H)       Exercise Target Goals: Date: 06/09/18  Exercise Program Goal: Individual exercise prescription set using results from initial 6 min walk test and THRR while considering  patient's activity barriers and safety.   Exercise Prescription Goal: Initial exercise prescription builds to 30-45 minutes a day of aerobic activity, 2-3 days per week.  Home exercise guidelines will be given to patient during program as part of exercise prescription that the participant will acknowledge.  Activity Barriers & Risk Stratification: Activity Barriers & Cardiac Risk Stratification - 06/09/18 1246      Activity Barriers & Cardiac Risk Stratification   Activity Barriers  Deconditioning;Muscular Weakness;Balance Concerns;Other (comment)    Comments  Lymphodema in bilater legs, wears support stockings    Cardiac Risk Stratification  High       6 Minute Walk: 6 Minute Walk    Row Name 06/09/18 1245         6 Minute Walk   Phase  Initial     Distance  1070 feet     Walk Time  6 minutes     #  of Rest Breaks  0     MPH  2.03     METS  3.15     RPE  11     VO2 Peak  11     Symptoms  No     Resting HR  57 bpm     Resting BP  132/58     Resting Oxygen Saturation   98 %     Exercise Oxygen Saturation  during 6 min walk  100 %     Max Ex. HR  112 bpm     Max Ex. BP  172/74     2 Minute Post BP  124/72        Oxygen Initial Assessment:   Oxygen Re-Evaluation:   Oxygen Discharge (Final Oxygen Re-Evaluation):   Initial Exercise Prescription: Initial Exercise Prescription - 06/09/18 1200      Date of Initial Exercise RX and Referring Provider   Date  06/09/18    Referring Provider  Kathlyn Sacramento MD      Treadmill   MPH  2    Grade  0.5    Minutes  15    METs  2.69      NuStep     Level  2    SPM  80    Minutes  15    METs  2      Recumbant Elliptical   Level  1    RPM  50    Minutes  15    METs  2      Prescription Details   Frequency (times per week)  2    Duration  Progress to 30 minutes of continuous aerobic without signs/symptoms of physical distress      Intensity   THRR 40-80% of Max Heartrate  99-141    Ratings of Perceived Exertion  11-13    Perceived Dyspnea  0-4      Progression   Progression  Continue to progress workloads to maintain intensity without signs/symptoms of physical distress.      Resistance Training   Training Prescription  Yes    Weight  3 lbs    Reps  10-15       Perform Capillary Blood Glucose checks as needed.  Exercise Prescription Changes:  Exercise Prescription Changes    Row Name 06/09/18 1200             Response to Exercise   Blood Pressure (Admit)  132/58       Blood Pressure (Exercise)  172/74       Blood Pressure (Exit)  124/72       Heart Rate (Admit)  57 bpm       Heart Rate (Exercise)  112 bpm       Heart Rate (Exit)  59 bpm       Oxygen Saturation (Admit)  98 %       Oxygen Saturation (Exercise)  100 %       Rating of Perceived Exertion (Exercise)  11       Symptoms  none       Comments  walk test results          Exercise Comments:   Exercise Goals and Review:  Exercise Goals    Row Name 06/09/18 1355             Exercise Goals   Increase Physical Activity  Yes       Intervention  Provide advice, education, support and counseling  about physical activity/exercise needs.;Develop an individualized exercise prescription for aerobic and resistive training based on initial evaluation findings, risk stratification, comorbidities and participant's personal goals.       Expected Outcomes  Short Term: Attend rehab on a regular basis to increase amount of physical activity.;Long Term: Add in home exercise to make exercise part of routine and to increase amount of physical activity.;Long  Term: Exercising regularly at least 3-5 days a week.       Increase Strength and Stamina  Yes       Intervention  Provide advice, education, support and counseling about physical activity/exercise needs.;Develop an individualized exercise prescription for aerobic and resistive training based on initial evaluation findings, risk stratification, comorbidities and participant's personal goals.       Expected Outcomes  Short Term: Increase workloads from initial exercise prescription for resistance, speed, and METs.;Short Term: Perform resistance training exercises routinely during rehab and add in resistance training at home;Long Term: Improve cardiorespiratory fitness, muscular endurance and strength as measured by increased METs and functional capacity (6MWT)       Able to understand and use rate of perceived exertion (RPE) scale  Yes       Intervention  Provide education and explanation on how to use RPE scale       Expected Outcomes  Short Term: Able to use RPE daily in rehab to express subjective intensity level;Long Term:  Able to use RPE to guide intensity level when exercising independently       Able to understand and use Dyspnea scale  Yes       Intervention  Provide education and explanation on how to use Dyspnea scale       Expected Outcomes  Short Term: Able to use Dyspnea scale daily in rehab to express subjective sense of shortness of breath during exertion;Long Term: Able to use Dyspnea scale to guide intensity level when exercising independently       Knowledge and understanding of Target Heart Rate Range (THRR)  Yes       Intervention  Provide education and explanation of THRR including how the numbers were predicted and where they are located for reference       Expected Outcomes  Short Term: Able to state/look up THRR;Short Term: Able to use daily as guideline for intensity in rehab;Long Term: Able to use THRR to govern intensity when exercising independently       Able to check pulse  independently  Yes       Intervention  Provide education and demonstration on how to check pulse in carotid and radial arteries.;Review the importance of being able to check your own pulse for safety during independent exercise       Expected Outcomes  Short Term: Able to explain why pulse checking is important during independent exercise;Long Term: Able to check pulse independently and accurately       Understanding of Exercise Prescription  Yes       Intervention  Provide education, explanation, and written materials on patient's individual exercise prescription       Expected Outcomes  Short Term: Able to explain program exercise prescription;Long Term: Able to explain home exercise prescription to exercise independently          Exercise Goals Re-Evaluation :   Discharge Exercise Prescription (Final Exercise Prescription Changes): Exercise Prescription Changes - 06/09/18 1200      Response to Exercise   Blood Pressure (Admit)  132/58    Blood Pressure (Exercise)  172/74    Blood Pressure (Exit)  124/72    Heart Rate (Admit)  57 bpm    Heart Rate (Exercise)  112 bpm    Heart Rate (Exit)  59 bpm    Oxygen Saturation (Admit)  98 %    Oxygen Saturation (Exercise)  100 %    Rating of Perceived Exertion (Exercise)  11    Symptoms  none    Comments  walk test results       Nutrition:  Target Goals: Understanding of nutrition guidelines, daily intake of sodium <1540m, cholesterol <2046m calories 30% from fat and 7% or less from saturated fats, daily to have 5 or more servings of fruits and vegetables.  Biometrics: Pre Biometrics - 06/09/18 1249      Pre Biometrics   Height  5' 3.6" (1.615 m)    Weight  202 lb 3.2 oz (91.7 kg)    Waist Circumference  35 inches    Hip Circumference  44 inches    Waist to Hip Ratio  0.8 %    BMI (Calculated)  35.16    Single Leg Stand  1.41 seconds        Nutrition Therapy Plan and Nutrition Goals: Nutrition Therapy & Goals - 06/09/18 1307       Intervention Plan   Intervention  Nutrition handout(s) given to patient.;Prescribe, educate and counsel regarding individualized specific dietary modifications aiming towards targeted core components such as weight, hypertension, lipid management, diabetes, heart failure and other comorbidities.    Expected Outcomes  Short Term Goal: Understand basic principles of dietary content, such as calories, fat, sodium, cholesterol and nutrients.;Short Term Goal: A plan has been developed with personal nutrition goals set during dietitian appointment.;Long Term Goal: Adherence to prescribed nutrition plan.       Nutrition Assessments: Nutrition Assessments - 06/09/18 1311      MEDFICTS Scores   Pre Score  36       Nutrition Goals Re-Evaluation:   Nutrition Goals Discharge (Final Nutrition Goals Re-Evaluation):   Psychosocial: Target Goals: Acknowledge presence or absence of significant depression and/or stress, maximize coping skills, provide positive support system. Participant is able to verbalize types and ability to use techniques and skills needed for reducing stress and depression.   Initial Review & Psychosocial Screening: Initial Psych Review & Screening - 06/09/18 1311      Initial Review   Current issues with  Current Stress Concerns;Current Sleep Concerns BrJamaiyahs currently sleeping on her daughter's couch until she finds a place of her own    Source of Stress Concerns  Family;Financial;Poor Coping Skills    Comments  [place] be closer to her daughter. 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 She does not like to rely on her children to help her, but whenever she tries to drive her anxiety "acts up" and she ends up having her children drive her.       Family Dynamics   Good Support System?  Yes family      Barriers   Psychosocial barriers to participate in program   There are no identifiable barriers or psychosocial needs.;The patient should benefit from training in stress management and relaxation.      Screening Interventions   Interventions  Encouraged to exercise;Provide feedback about the scores to participant;Program counselor consult;To provide support and resources with identified psychosocial  needs    Expected Outcomes  Short Term goal: Utilizing psychosocial counselor, staff and physician to assist with identification of specific Stressors or current issues interfering with healing process. Setting desired goal for each stressor or current issue identified.;Long Term Goal: Stressors or current issues are controlled or eliminated.;Short Term goal: Identification and review with participant of any Quality of Life or Depression concerns found by scoring the questionnaire.;Long Term goal: The participant improves quality of Life and PHQ9 Scores as seen by post scores and/or verbalization of changes       Quality of Life Scores:  Quality of Life - 06/09/18 1058      Quality of Life   Select  Quality of Life      Quality of Life Scores   Health/Function Pre  19.61 %    Socioeconomic Pre  6.2 %    Psych/Spiritual Pre  20.57 %    Family Pre  16.13 %    GLOBAL Pre  17.3 %      Scores of 19 and below usually indicate a poorer quality of life in these areas.  A difference of  2-3 points is a clinically meaningful difference.  A difference of 2-3 points in the total score of the Quality of Life Index has been associated with significant improvement in overall quality of life, self-image, physical symptoms, and general health in studies assessing change in quality of life.  PHQ-9: Recent Review Flowsheet Data    Depression screen [facility] 2/9 06/09/2018   Decreased Interest 0   Down, Depressed, Hopeless 0   PHQ - 2 Score 0   Altered sleeping 0   Tired, decreased energy 1   Change in appetite 1   Feeling bad or failure about yourself  1   Trouble  concentrating 1   Moving slowly or fidgety/restless 0   Suicidal thoughts 1    PHQ-9 Score 5   Difficult doing work/chores Somewhat difficult     Interpretation of Total Score  Total Score Depression Severity:  1-4 = Minimal depression, 5-9 = Mild depression, 10-14 = Moderate depression, 15-19 = Moderately severe depression, 20-27 = Severe depression   Psychosocial Evaluation and Intervention:   Psychosocial Re-Evaluation:   Psychosocial Discharge (Final Psychosocial Re-Evaluation):   Vocational Rehabilitation: Provide vocational rehab assistance to qualifying candidates.   Vocational Rehab Evaluation & Intervention: Vocational Rehab - 06/09/18 1320      Initial Vocational Rehab Evaluation & Intervention   Assessment shows need for Vocational Rehabilitation  Yes    Vocational Rehab Packet given to patient  -- Will give her first day of class       Education: Education Goals: Education classes will be provided on a variety of topics geared toward better understanding of heart health and risk factor modification. Participant will state understanding/return demonstration of topics presented as noted by education test scores.  Learning Barriers/Preferences: Learning Barriers/Preferences - 06/09/18 1318      Learning Barriers/Preferences   Learning Barriers  None    Learning Preferences  Verbal Instruction       Education Topics:  AED/CPR: - Group verbal and written instruction with the use of models to demonstrate the basic use of the AED with the basic ABC's of resuscitation.   General Nutrition Guidelines/Fats and Fiber: -Group instruction provided by verbal, written material, models and posters to present the general guidelines for heart healthy nutrition. Gives an explanation and review of dietary fats and fiber.   Controlling Sodium/Reading Food Labels: -Group verbal and  written material supporting the discussion of sodium use in heart healthy nutrition. Review  and explanation with models, verbal and written materials for utilization of the food label.   Exercise Physiology & General Exercise Guidelines: - Group verbal and written instruction with models to review the exercise physiology of the cardiovascular system and associated critical values. Provides general exercise guidelines with specific guidelines to those with heart or lung disease.    Aerobic Exercise & Resistance Training: - Gives group verbal and written instruction on the various components of exercise. Focuses on aerobic and resistive training programs and the benefits of this training and how to safely progress through these programs..   Flexibility, Balance, Mind/Body Relaxation: Provides group verbal/written instruction on the benefits of flexibility and balance training, including mind/body exercise modes such as yoga, pilates and tai chi.  Demonstration and skill practice provided.   Stress and Anxiety: - Provides group verbal and written instruction about the health risks of elevated stress and causes of high stress.  Discuss the correlation between heart/lung disease and anxiety and treatment options. Review healthy ways to manage with stress and anxiety.   Depression: - Provides group verbal and written instruction on the correlation between heart/lung disease and depressed mood, treatment options, and the stigmas associated with seeking treatment.   Anatomy & Physiology of the Heart: - Group verbal and written instruction and models provide basic cardiac anatomy and physiology, with the coronary electrical and arterial systems. Review of Valvular disease and Heart Failure   Cardiac Procedures: - Group verbal and written instruction to review commonly prescribed medications for heart disease. Reviews the medication, class of the drug, and side effects. Includes the steps to properly store meds and maintain the prescription regimen. (beta blockers and  nitrates)   Cardiac Medications I: - Group verbal and written instruction to review commonly prescribed medications for heart disease. Reviews the medication, class of the drug, and side effects. Includes the steps to properly store meds and maintain the prescription regimen.   Cardiac Medications II: -Group verbal and written instruction to review commonly prescribed medications for heart disease. Reviews the medication, class of the drug, and side effects. (all other drug classes)    Go Sex-Intimacy & Heart Disease, Get SMART - Goal Setting: - Group verbal and written instruction through game format to discuss heart disease and the return to sexual intimacy. Provides group verbal and written material to discuss and apply goal setting through the application of the S.M.A.R.T. Method.   Other Matters of the Heart: - Provides group verbal, written materials and models to describe Stable Angina and Peripheral Artery. Includes description of the disease process and treatment options available to the cardiac patient.   Exercise & Equipment Safety: - Individual verbal instruction and demonstration of equipment use and safety with use of the equipment.   Cardiac Rehab from 06/09/2018 in [facility] Cardiac and Pulmonary Rehab  Date  06/09/18  Educator  [facility]  Instruction Review Code  1- Verbalizes Understanding      Infection Prevention: - Provides verbal and written material to individual with discussion of infection control including proper hand washing and proper equipment cleaning during exercise session.   Cardiac Rehab from 06/09/2018 in [facility] Cardiac and Pulmonary Rehab  Date  06/09/18  Educator  [facility]  Instruction Review Code  1- Verbalizes Understanding      Falls Prevention: - Provides verbal and written material to individual with discussion of falls prevention and safety.   Cardiac Rehab from 06/09/2018 in  [place] Cardiac and Pulmonary Rehab  Date  06/09/18  Educator  [facility]  Instruction  Review Code  1- Verbalizes Understanding      Diabetes: - Individual verbal and written instruction to review signs/symptoms of diabetes, desired ranges of glucose level fasting, after meals and with exercise. Acknowledge that pre and post exercise glucose checks will be done for 3 sessions at entry of program.   Know Your Numbers and Risk Factors: -Group verbal and written instruction about important numbers in your health.  Discussion of what are risk factors and how they play a role in the disease process.  Review of Cholesterol, Blood Pressure, Diabetes, and BMI and the role they play in your overall health.   Sleep Hygiene: -Provides group verbal and written instruction about how sleep can affect your health.  Define sleep hygiene, discuss sleep cycles and impact of sleep habits. Review good sleep hygiene tips.    Other: -Provides group and verbal instruction on various topics (see comments)   Knowledge Questionnaire Score: Knowledge Questionnaire Score - 06/09/18 1057      Knowledge Questionnaire Score   Pre Score  21/26 Test reviewed with pt today.  Education Focus: Nutrition, Exercise,  Anatomy, HTN, Heart Failure       Core Components/Risk Factors/Patient Goals at Admission: Personal Goals and Risk Factors at Admission - 06/09/18 1306      Core Components/Risk Factors/Patient Goals on Admission    Weight Management  Yes;Weight Loss    Intervention  Weight Management: Develop a combined nutrition and exercise program designed to reach desired caloric intake, while maintaining appropriate intake of nutrient and fiber, sodium and fats, and appropriate energy expenditure required for the weight goal.;Weight Management: Provide education and appropriate resources to help participant work on and attain dietary goals.;Weight Management/Obesity: Establish reasonable short term and long term weight goals.    Admit Weight  202 lb (91.6 kg)    Goal Weight: Short Term  197 lb (89.4  kg)    Goal Weight: Long Term  190 lb (86.2 kg)    Expected Outcomes  Short Term: Continue to assess and modify interventions until short term weight is achieved;Long Term: Adherence to nutrition and physical activity/exercise program aimed toward attainment of established weight goal;Weight Loss: Understanding of general recommendations for a balanced deficit meal plan, which promotes 1-2 lb weight loss per week and includes a negative energy balance of [id number] kcal/d;Understanding recommendations for meals to include 15-35% energy as protein, 25-35% energy from fat, 35-60% energy from carbohydrates, less than 261m of dietary cholesterol, 20-35 gm of total fiber daily;Understanding of distribution of calorie intake throughout the day with the consumption of 4-5 meals/snacks    Hypertension  Yes    Intervention  Provide education on lifestyle modifcations including regular physical activity/exercise, weight management, moderate sodium restriction and increased consumption of fresh fruit, vegetables, and low fat dairy, alcohol moderation, and smoking cessation.;Monitor prescription use compliance.    Expected Outcomes  Short Term: Continued assessment and intervention until BP is < 140/910mHG in hypertensive participants. < 130/8035mG in hypertensive participants with diabetes, heart failure or chronic kidney disease.;Long Term: Maintenance of blood pressure at goal levels.    Lipids  Yes    Intervention  Provide education and support for participant on nutrition & aerobic/resistive exercise along with prescribed medications to achieve LDL <61m72mDL >40mg47m Expected Outcomes  Short Term: Participant states understanding of desired cholesterol values and is compliant with medications prescribed. Participant is following  exercise prescription and nutrition guidelines.;Long Term: Cholesterol controlled with medications as prescribed, with individualized exercise RX and with personalized nutrition plan.  Value goals: LDL < 51m, HDL > 40 mg.       Core Components/Risk Factors/Patient Goals Review:    Core Components/Risk Factors/Patient Goals at Discharge (Final Review):    ITP Comments: ITP Comments    Row Name 06/09/18 1305           ITP Comments  Med Review completed. Initial IT[facility] created. [facility]          Comments: Initial ITP

## 2018-06-09 NOTE — Progress Notes (Signed)
Daily Session Note  Patient Details  Name: Kelsey Terry MRN: [id number] Date of Birth: 04/15/1961 Referring Provider:     Cardiac Rehab from 06/09/2018 in [facility]  Referring Provider  Arida, Muhammad MD      Encounter Date: 06/09/2018  Check In: Session Check In - 06/09/18 1303      Check-In   Location  [facility]    Staff Present  Jessica Hawkins, MA, RCEP, CCRP, Exercise Physiologist;Meredith Craven, RN BSN    Supervising physician immediately available to respond to emergencies  See telemetry face sheet for immediately available ER MD    Warm-up and Cool-down  Not performed (comment) Med Review    Resistance Training Performed  Yes    VAD Patient?  No    PAD/SET Patient?  No      Pain Assessment   Currently in Pain?  No/denies        Exercise Prescription Changes - 06/09/18 1200      Response to Exercise   Blood Pressure (Admit)  132/58    Blood Pressure (Exercise)  172/74    Blood Pressure (Exit)  124/72    Heart Rate (Admit)  57 bpm    Heart Rate (Exercise)  112 bpm    Heart Rate (Exit)  59 bpm    Oxygen Saturation (Admit)  98 %    Oxygen Saturation (Exercise)  100 %    Rating of Perceived Exertion (Exercise)  11    Symptoms  none    Comments  walk test results       Social History   Tobacco Use  Smoking Status Never Smoker  Smokeless Tobacco Never Used    Goals Met:  Proper associated with RPD/PD & O2 Sat Exercise tolerated well Personal goals reviewed No report of cardiac concerns or symptoms Strength training completed today  Goals Unmet:  Not Applicable  Comments: Med Review completed.    Dr. Mark Miller is Medical Director for [facility] and [facility]. 

## 2018-06-09 NOTE — Progress Notes (Signed)
Met with patient completed financial assistance application for [facility] due to recent ED visit.  Patient agreed to be responsible for gathering financial information and forwarding to appropriate department in [place].    Completed [facility] application and contract.  Patient agreed to all terms of the [facility] contract.  Patient to provide notarized letter of support and 2016 taxes.  Provided patient with community resource material based on her particular needs.    [facility] Prescription Application completed with patient.  Forwarded to Dr. Ward for signature.  Upon receipt of signed application from provider and proof of income from patient, [facility] Prescription Application will be submitted to [facility].  Menelik Mcfarren J. Viren Lebeau Care Manager [facility]  

## 2018-06-09 NOTE — Patient Instructions (Addendum)
Patient Instructions  Patient Details  Name: Kelsey Terry MRN: [id number] Date of Birth: [birth year] Referring Provider:  Wellington Hampshire, MD  Below are your personal goals for exercise, nutrition, and risk factors. Our goal is to help you stay on track towards obtaining and maintaining these goals. We will be discussing your progress on these goals with you throughout the program.  Initial Exercise Prescription: Initial Exercise Prescription - 06/09/18 1200      Date of Initial Exercise RX and Referring Provider   Date  06/09/18    Referring Provider  Kathlyn Sacramento MD      Treadmill   MPH  2    Grade  0.5    Minutes  15    METs  2.69      NuStep   Level  2    SPM  80    Minutes  15    METs  2      Recumbant Elliptical   Level  1    RPM  50    Minutes  15    METs  2      Prescription Details   Frequency (times per week)  2    Duration  Progress to 30 minutes of continuous aerobic without signs/symptoms of physical distress      Intensity   THRR 40-80% of Max Heartrate  99-141    Ratings of Perceived Exertion  11-13    Perceived Dyspnea  0-4      Progression   Progression  Continue to progress workloads to maintain intensity without signs/symptoms of physical distress.      Resistance Training   Training Prescription  Yes    Weight  3 lbs    Reps  10-15       Exercise Goals: Frequency: Be able to perform aerobic exercise two to three times per week in program working toward 2-5 days per week of home exercise.  Intensity: Work with a perceived exertion of 11 (fairly light) - 15 (hard) while following your exercise prescription.  We will make changes to your prescription with you as you progress through the program.   Duration: Be able to do 30 to 45 minutes of continuous aerobic exercise in addition to a 5 minute warm-up and a 5 minute cool-down routine.   Nutrition Goals: Your personal nutrition goals will be established when you do your nutrition analysis  with the dietician.  The following are general nutrition guidelines to follow: Cholesterol < 200mg /day Sodium < 1500mg /day Fiber: Women over 50 yrs - 21 grams per day  Personal Goals: Personal Goals and Risk Factors at Admission - 06/09/18 1306      Core Components/Risk Factors/Patient Goals on Admission    Weight Management  Yes;Weight Loss    Intervention  Weight Management: Develop a combined nutrition and exercise program designed to reach desired caloric intake, while maintaining appropriate intake of nutrient and fiber, sodium and fats, and appropriate energy expenditure required for the weight goal.;Weight Management: Provide education and appropriate resources to help participant work on and attain dietary goals.;Weight Management/Obesity: Establish reasonable short term and long term weight goals.    Admit Weight  202 lb (91.6 kg)    Goal Weight: Short Term  197 lb (89.4 kg)    Goal Weight: Long Term  190 lb (86.2 kg)    Expected Outcomes  Short Term: Continue to assess and modify interventions until short term weight is achieved;Long Term: Adherence to nutrition and physical activity/exercise program aimed  toward attainment of established weight goal;Weight Loss: Understanding of general recommendations for a balanced deficit meal plan, which promotes 1-2 lb weight loss per week and includes a negative energy balance of [phone] kcal/d;Understanding recommendations for meals to include 15-35% energy as protein, 25-35% energy from fat, 35-60% energy from carbohydrates, less than 200mg  of dietary cholesterol, 20-35 gm of total fiber daily;Understanding of distribution of calorie intake throughout the day with the consumption of 4-5 meals/snacks    Hypertension  Yes    Intervention  Provide education on lifestyle modifcations including regular physical activity/exercise, weight management, moderate sodium restriction and increased consumption of fresh fruit, vegetables, and low fat dairy,  alcohol moderation, and smoking cessation.;Monitor prescription use compliance.    Expected Outcomes  Short Term: Continued assessment and intervention until BP is < 140/91mm HG in hypertensive participants. < 130/36mm HG in hypertensive participants with diabetes, heart failure or chronic kidney disease.;Long Term: Maintenance of blood pressure at goal levels.    Lipids  Yes    Intervention  Provide education and support for participant on nutrition & aerobic/resistive exercise along with prescribed medications to achieve LDL 70mg , HDL >40mg .    Expected Outcomes  Short Term: Participant states understanding of desired cholesterol values and is compliant with medications prescribed. Participant is following exercise prescription and nutrition guidelines.;Long Term: Cholesterol controlled with medications as prescribed, with individualized exercise RX and with personalized nutrition plan. Value goals: LDL < 70mg , HDL > 40 mg.       Tobacco Use Initial Evaluation: Social History   Tobacco Use  Smoking Status Never Smoker  Smokeless Tobacco Never Used    Exercise Goals and Review: Exercise Goals    Row Name 06/09/18 1355             Exercise Goals   Increase Physical Activity  Yes       Intervention  Provide advice, education, support and counseling about physical activity/exercise needs.;Develop an individualized exercise prescription for aerobic and resistive training based on initial evaluation findings, risk stratification, comorbidities and participant's personal goals.       Expected Outcomes  Short Term: Attend rehab on a regular basis to increase amount of physical activity.;Long Term: Add in home exercise to make exercise part of routine and to increase amount of physical activity.;Long Term: Exercising regularly at least 3-5 days a week.       Increase Strength and Stamina  Yes       Intervention  Provide advice, education, support and counseling about physical activity/exercise  needs.;Develop an individualized exercise prescription for aerobic and resistive training based on initial evaluation findings, risk stratification, comorbidities and participant's personal goals.       Expected Outcomes  Short Term: Increase workloads from initial exercise prescription for resistance, speed, and METs.;Short Term: Perform resistance training exercises routinely during rehab and add in resistance training at home;Long Term: Improve cardiorespiratory fitness, muscular endurance and strength as measured by increased METs and functional capacity (6MWT)       Able to understand and use rate of perceived exertion (RPE) scale  Yes       Intervention  Provide education and explanation on how to use RPE scale       Expected Outcomes  Short Term: Able to use RPE daily in rehab to express subjective intensity level;Long Term:  Able to use RPE to guide intensity level when exercising independently       Able to understand and use Dyspnea scale  Yes  Intervention  Provide education and explanation on how to use Dyspnea scale       Expected Outcomes  Short Term: Able to use Dyspnea scale daily in rehab to express subjective sense of shortness of breath during exertion;Long Term: Able to use Dyspnea scale to guide intensity level when exercising independently       Knowledge and understanding of Target Heart Rate Range (THRR)  Yes       Intervention  Provide education and explanation of THRR including how the numbers were predicted and where they are located for reference       Expected Outcomes  Short Term: Able to state/look up THRR;Short Term: Able to use daily as guideline for intensity in rehab;Long Term: Able to use THRR to govern intensity when exercising independently       Able to check pulse independently  Yes       Intervention  Provide education and demonstration on how to check pulse in carotid and radial arteries.;Review the importance of being able to check your own pulse for safety  during independent exercise       Expected Outcomes  Short Term: Able to explain why pulse checking is important during independent exercise;Long Term: Able to check pulse independently and accurately       Understanding of Exercise Prescription  Yes       Intervention  Provide education, explanation, and written materials on patient's individual exercise prescription       Expected Outcomes  Short Term: Able to explain program exercise prescription;Long Term: Able to explain home exercise prescription to exercise independently          Copy of goals given to participant.

## 2018-06-19 ENCOUNTER — Encounter: Payer: Medicaid Other | Attending: Cardiovascular Disease

## 2018-06-19 DIAGNOSIS — I2510 Atherosclerotic heart disease of native coronary artery without angina pectoris: Secondary | ICD-10-CM | POA: Insufficient documentation

## 2018-06-19 DIAGNOSIS — I214 Non-ST elevation (NSTEMI) myocardial infarction: Secondary | ICD-10-CM | POA: Insufficient documentation

## 2018-06-19 DIAGNOSIS — Z7982 Long term (current) use of aspirin: Secondary | ICD-10-CM | POA: Insufficient documentation

## 2018-06-19 DIAGNOSIS — I890 Lymphedema, not elsewhere classified: Secondary | ICD-10-CM | POA: Insufficient documentation

## 2018-06-19 DIAGNOSIS — I10 Essential (primary) hypertension: Secondary | ICD-10-CM | POA: Insufficient documentation

## 2018-06-19 DIAGNOSIS — Z955 Presence of coronary angioplasty implant and graft: Secondary | ICD-10-CM | POA: Insufficient documentation

## 2018-06-19 DIAGNOSIS — Z79899 Other long term (current) drug therapy: Secondary | ICD-10-CM | POA: Insufficient documentation

## 2018-06-19 NOTE — Progress Notes (Signed)
Daily Session Note  Patient Details  Name: Kelsey Terry MRN: [id number] Date of Birth: 1961-09-24 Referring Provider:     Cardiac Rehab from 06/09/2018 in [facility]  Referring Provider  Kathlyn Sacramento MD      Encounter Date: 06/19/2018  Check In: Session Check In - 06/19/18 2301      Check-In   Supervising physician immediately available to respond to emergencies  See telemetry face sheet for immediately available ER MD    Location  [facility]    Staff Present  Gerlene Burdock, RN, BSN;Saraann Enneking Darrin Nipper, [place], RCEP, CCRP, Exercise Physiologist    Medication changes reported      No    Fall or balance concerns reported     No    Warm-up and Cool-down  Performed on first and last piece of equipment    Resistance Training Performed  Yes    VAD Patient?  No      Pain Assessment   Currently in Pain?  No/denies          Social History   Tobacco Use  Smoking Status Never Smoker  Smokeless Tobacco Never Used    Goals Met:  Exercise tolerated well Personal goals reviewed No report of cardiac concerns or symptoms Strength training completed today  Goals Unmet:  Not Applicable  Comments: First full day of exercise!  Patient was oriented to gym and equipment including functions, settings, policies, and procedures.  Patient's individual exercise prescription and treatment plan were reviewed.  All starting workloads were established based on the results of the 6 minute walk test done at initial orientation visit.  The plan for exercise progression was also introduced and progression will be customized based on patient's performance and goals.   Dr. Emily Filbert is Medical Director for [facility] and [facility].

## 2018-06-24 ENCOUNTER — Encounter: Payer: Medicaid Other | Admitting: *Deleted

## 2018-06-24 DIAGNOSIS — Z955 Presence of coronary angioplasty implant and graft: Secondary | ICD-10-CM

## 2018-06-24 DIAGNOSIS — I214 Non-ST elevation (NSTEMI) myocardial infarction: Secondary | ICD-10-CM

## 2018-06-24 NOTE — Progress Notes (Signed)
Daily Session Note  Patient Details  Name: Kelsey Terry MRN: [id number] Date of Birth: 05-13-1961 Referring Provider:     Cardiac Rehab from 06/09/2018 in [facility]  Referring Provider  Kathlyn Sacramento MD      Encounter Date: 06/24/2018  Check In: Session Check In - 06/24/18 1694      Check-In   Supervising physician immediately available to respond to emergencies  See telemetry face sheet for immediately available ER MD    Location  [facility]    Staff Present  Joellyn Rued, BS, PEC;Susanne Bice, RN, BSN, CCRP;Jessica [place], [place], RCEP, CCRP, Exercise Physiologist    Medication changes reported      No    Fall or balance concerns reported     No    Warm-up and Cool-down  Performed on first and last piece of equipment    Resistance Training Performed  Yes    VAD Patient?  No    PAD/SET Patient?  No      Pain Assessment   Currently in Pain?  No/denies          Social History   Tobacco Use  Smoking Status Never Smoker  Smokeless Tobacco Never Used    Goals Met:  Independence with exercise equipment Exercise tolerated well No report of cardiac concerns or symptoms Strength training completed today  Goals Unmet:  Not Applicable  Comments: Pt able to follow exercise prescription today without complaint.  Will continue to monitor for progression.    Dr. Emily Filbert is Medical Director for [place] and [facility].

## 2018-06-26 ENCOUNTER — Encounter: Payer: Medicaid Other | Admitting: *Deleted

## 2018-06-26 DIAGNOSIS — I214 Non-ST elevation (NSTEMI) myocardial infarction: Secondary | ICD-10-CM

## 2018-06-26 DIAGNOSIS — Z955 Presence of coronary angioplasty implant and graft: Secondary | ICD-10-CM

## 2018-06-26 NOTE — Progress Notes (Signed)
Daily Session Note  Patient Details  Name: Kelsey Terry MRN: [id number] Date of Birth: Mar 30, 1961 Referring Provider:     Cardiac Rehab from 06/09/2018 in [facility]  Referring Provider  Kathlyn Sacramento MD      Encounter Date: 06/26/2018  Check In: Session Check In - 06/26/18 0941      Check-In   Supervising physician immediately available to respond to emergencies  See telemetry face sheet for immediately available ER MD    Location  [facility]    Staff Present  Joellyn Rued, BS, PEC;Meredith Sherryll Burger, RN BSN;Joseph Hood RCP,RRT,BSRT    Medication changes reported      No    Fall or balance concerns reported     No    Warm-up and Cool-down  Performed on first and last piece of equipment    Resistance Training Performed  Yes    VAD Patient?  No    PAD/SET Patient?  No      Pain Assessment   Currently in Pain?  No/denies    Multiple Pain Sites  No          Social History   Tobacco Use  Smoking Status Never Smoker  Smokeless Tobacco Never Used    Goals Met:  Independence with exercise equipment Exercise tolerated well No report of cardiac concerns or symptoms Strength training completed today  Goals Unmet:  Not Applicable  Comments: Pt able to follow exercise prescription today without complaint.  Will continue to monitor for progression.    Dr. Emily Filbert is Medical Director for [facility] and [facility].

## 2018-06-30 ENCOUNTER — Other Ambulatory Visit: Payer: Self-pay

## 2018-06-30 ENCOUNTER — Emergency Department
Admission: EM | Admit: 2018-06-30 | Discharge: 2018-06-30 | Disposition: A | Discharge status: Home / Self Care | Payer: Medicaid Other | Attending: Student in an Organized Health Care Education/Training Program | Admitting: Student in an Organized Health Care Education/Training Program

## 2018-06-30 ENCOUNTER — Telehealth: Payer: Self-pay | Admitting: Cardiovascular Disease

## 2018-06-30 DIAGNOSIS — R55 Syncope and collapse: Secondary | ICD-10-CM | POA: Insufficient documentation

## 2018-06-30 DIAGNOSIS — Z79899 Other long term (current) drug therapy: Secondary | ICD-10-CM | POA: Insufficient documentation

## 2018-06-30 DIAGNOSIS — I2510 Atherosclerotic heart disease of native coronary artery without angina pectoris: Secondary | ICD-10-CM | POA: Insufficient documentation

## 2018-06-30 DIAGNOSIS — Z7982 Long term (current) use of aspirin: Secondary | ICD-10-CM | POA: Insufficient documentation

## 2018-06-30 DIAGNOSIS — I110 Hypertensive heart disease with heart failure: Secondary | ICD-10-CM | POA: Insufficient documentation

## 2018-06-30 DIAGNOSIS — I5030 Unspecified diastolic (congestive) heart failure: Secondary | ICD-10-CM | POA: Insufficient documentation

## 2018-06-30 LAB — CBC WITH DIFFERENTIAL/PLATELET
BASOS ABS: 0 10*3/uL (ref 0–0.1)
Basophils Relative: 1 %
Eosinophils Absolute: 0.1 10*3/uL (ref 0–0.7)
Eosinophils Relative: 1 %
HCT: 38.5 % (ref 35.0–47.0)
Hemoglobin: 12.9 g/dL (ref 12.0–16.0)
LYMPHS PCT: 26 %
Lymphs Abs: 1.4 10*3/uL (ref 1.0–3.6)
MCH: 27.4 pg (ref 26.0–34.0)
MCHC: 33.4 g/dL (ref 32.0–36.0)
MCV: 82 fL (ref 80.0–100.0)
Monocytes Absolute: 0.5 10*3/uL (ref 0.2–0.9)
Monocytes Relative: 10 %
Neutro Abs: 3.4 10*3/uL (ref 1.4–6.5)
Neutrophils Relative %: 62 %
Platelets: 240 10*3/uL (ref 150–440)
RBC: 4.69 MIL/uL (ref 3.80–5.20)
RDW: 15 % — AB (ref 11.5–14.5)
WBC: 5.5 10*3/uL (ref 3.6–11.0)

## 2018-06-30 LAB — COMPREHENSIVE METABOLIC PANEL
ALT: 18 U/L (ref 0–44)
AST: 22 U/L (ref 15–41)
Albumin: 4.1 g/dL (ref 3.5–5.0)
Alkaline Phosphatase: 58 U/L (ref 38–126)
Anion gap: 7 (ref 5–15)
BUN: 19 mg/dL (ref 6–20)
CHLORIDE: 104 mmol/L (ref 98–111)
CO2: 29 mmol/L (ref 22–32)
Calcium: 9.5 mg/dL (ref 8.9–10.3)
Creatinine, Ser: 0.92 mg/dL (ref 0.44–1.00)
Glucose, Bld: 109 mg/dL — ABNORMAL HIGH (ref 70–99)
POTASSIUM: 3.5 mmol/L (ref 3.5–5.1)
Sodium: 140 mmol/L (ref 135–145)
Total Bilirubin: 1.2 mg/dL (ref 0.3–1.2)
Total Protein: 8.1 g/dL (ref 6.5–8.1)

## 2018-06-30 LAB — TROPONIN I

## 2018-06-30 MED ORDER — SODIUM CHLORIDE 0.9 % IV BOLUS
500.0000 mL | Freq: Once | INTRAVENOUS | Status: AC
  Administered 2018-06-30: 500 mL via INTRAVENOUS

## 2018-06-30 NOTE — Telephone Encounter (Signed)
Patient calling to make office aware she was seen in the ED today Patient aware of upcoming appointment with R. Dunn on 8/20

## 2018-06-30 NOTE — Telephone Encounter (Signed)
Spoke with patient and reviewed ED visit along with follow up information. Instructed her to please give [place] a call if her symptoms return before her upcoming appointment. She verbalized understanding with no further questions at this time.

## 2018-06-30 NOTE — ED Notes (Signed)
Patient ambulated around nurses station with steady gait and NAD noted.

## 2018-06-30 NOTE — ED Provider Notes (Signed)
[facility] Emergency Department Provider Note    First MD Initiated Contact with Patient 06/30/18 1155     (approximate)  I have reviewed the triage vital signs and the nursing notes.   HISTORY  Chief Complaint Near Syncope    HPI Kelsey Terry is a 57 y.o. female extensive recent past medical history with recent and STEMI status post stent placement presents the ER after a syncopal event that occurred while [place].  Started seeing spots of feeling lightheaded.  Had to put her head down on the counter.  Denied any chest pain or palpitations.  Symptoms were brief in nature.  This is a second time this is happened since being discharged.  Had not had much to eat this morning.  Denies any history of diabetes.  She is otherwise pain-free and in no acute distress at this time.    Past Medical History:  Diagnosis Date  . Asthma   . CAD in native artery    a. NSTEMI 05/16/18: [place] 05/16/18 - ost-pLAD 95% s/p PCI/DES, mLAD 30%, ostD1 70%, ost-pLCx 30%, mRCA 90%, dRCA 20%; b. staged PCI/DES to [facility] 7/1  . Diastolic dysfunction    a. TTE 05/16/18: EF 55-60%, no RWMA, Gr1DD, trivial AI, calcified mitral annulus, mild MR, mildly dilated LA  . Hypertension   . Lymphedema    LLE  . Pericarditis    a. ~ 2012 in [place], [place] s/p pericardiocentesis    Family History  Problem Relation Age of Onset  . Hypertension Mother   . Pancreatic cancer Father    Past Surgical History:  Procedure Laterality Date  . CESAREAN SECTION    . CORONARY STENT INTERVENTION N/A 05/16/2018   Procedure: CORONARY STENT INTERVENTION;  Surgeon: Wellington Hampshire, MD;  Location: [facility];  Service: Cardiovascular;  Laterality: N/A;  . CORONARY STENT INTERVENTION N/A 05/19/2018   Procedure: CORONARY STENT INTERVENTION;  Surgeon: Wellington Hampshire, MD;  Location: [place] [facility];  Service: Cardiovascular;  Laterality: N/A;  . LEFT HEART CATH AND CORONARY  ANGIOGRAPHY N/A 05/16/2018   Procedure: LEFT HEART CATH AND CORONARY ANGIOGRAPHY;  Surgeon: Wellington Hampshire, MD;  Location: [place] [facility];  Service: Cardiovascular;  Laterality: N/A;  . PERICARDIOCENTESIS     Patient Active Problem List   Diagnosis Date Noted  . NSTEMI (non-ST elevated myocardial infarction) ([place]) 05/16/2018      Prior to Admission medications   Medication Sig Start Date End Date Taking? Authorizing Provider  aspirin EC 81 MG EC tablet Take 1 tablet (81 mg total) by mouth daily. 05/21/18 07/20/18  Henreitta Leber, MD  carvedilol (COREG) 12.5 MG tablet Take 1 tablet (12.5 mg total) by mouth 2 (two) times daily with a meal. 05/20/18 07/19/18  Henreitta Leber, MD  losartan-hydrochlorothiazide (HYZAAR) 100-12.5 MG tablet Take 1 tablet by mouth daily. 05/29/18   Dunn, Areta Haber, PA-C  potassium chloride (K-DUR) 10 MEQ tablet Take 20 mEq by mouth daily.    [provider]  rosuvastatin (CRESTOR) 20 MG tablet Take 1 tablet (20 mg total) by mouth daily at 6 PM. 05/20/18 07/19/18  Henreitta Leber, MD  ticagrelor (BRILINTA) 90 MG TABS tablet Take 1 tablet (90 mg total) by mouth 2 (two) times daily. 05/20/18 07/19/18  Henreitta Leber, MD    Allergies Amoxicillin    Social History Social History   Tobacco Use  . Smoking status: Never Smoker  .  Smokeless tobacco: Never Used  Substance Use Topics  . Alcohol use: Not Currently  . Drug use: Never    Review of Systems Patient denies headaches, rhinorrhea, blurry vision, numbness, shortness of breath, chest pain, edema, cough, abdominal pain, nausea, vomiting, diarrhea, dysuria, fevers, rashes or hallucinations unless otherwise stated above in HPI. ____________________________________________   PHYSICAL EXAM:  VITAL SIGNS: Vitals:   06/30/18 1417 06/30/18 1418  BP:  (!) 152/71  Pulse: 60   Resp: (!) 21 16  Temp:    SpO2: 100%     Constitutional: Alert and oriented.  Eyes: Conjunctivae are normal.  Head:  Atraumatic. Nose: No congestion/rhinnorhea. Mouth/Throat: Mucous membranes are moist.   Neck: No stridor. Painless ROM.  Cardiovascular: Normal rate, regular rhythm. Grossly normal heart sounds.  Good peripheral circulation. Respiratory: Normal respiratory effort.  No retractions. Lungs CTAB. Gastrointestinal: Soft and nontender. No distention. No abdominal bruits. No CVA tenderness. Genitourinary:  Musculoskeletal: No lower extremity tenderness nor edema.  No joint effusions. Neurologic:  Normal speech and language. No gross focal neurologic deficits are appreciated. No facial droop Skin:  Skin is warm, dry and intact. No rash noted. Psychiatric: Mood and affect are normal. Speech and behavior are normal.  ____________________________________________   LABS (all labs ordered are listed, but only abnormal results are displayed)  Results for orders placed or performed during the hospital encounter of 06/30/18 (from the past 24 hour(s))  CBC with Differential/Platelet     Status: Abnormal   Collection Time: 06/30/18 12:35 PM  Result Value Ref Range   WBC 5.5 3.6 - 11.0 K/uL   RBC 4.69 3.80 - 5.20 MIL/uL   Hemoglobin 12.9 12.0 - 16.0 g/dL   HCT 38.5 35.0 - 47.0 %   MCV 82.0 80.0 - 100.0 fL   MCH 27.4 26.0 - 34.0 pg   MCHC 33.4 32.0 - 36.0 g/dL   RDW 15.0 (H) 11.5 - 14.5 %   Platelets 240 150 - 440 K/uL   Neutrophils Relative % 62 %   Neutro Abs 3.4 1.4 - 6.5 K/uL   Lymphocytes Relative 26 %   Lymphs Abs 1.4 1.0 - 3.6 K/uL   Monocytes Relative 10 %   Monocytes Absolute 0.5 0.2 - 0.9 K/uL   Eosinophils Relative 1 %   Eosinophils Absolute 0.1 0 - 0.7 K/uL   Basophils Relative 1 %   Basophils Absolute 0.0 0 - 0.1 K/uL  Comprehensive metabolic panel     Status: Abnormal   Collection Time: 06/30/18 12:35 PM  Result Value Ref Range   Sodium 140 135 - 145 mmol/L   Potassium 3.5 3.5 - 5.1 mmol/L   Chloride 104 98 - 111 mmol/L   CO2 29 22 - 32 mmol/L   Glucose, Bld 109 (H) 70 - 99  mg/dL   BUN 19 6 - 20 mg/dL   Creatinine, Ser 0.92 0.44 - 1.00 mg/dL   Calcium 9.5 8.9 - 10.3 mg/dL   Total Protein 8.1 6.5 - 8.1 g/dL   Albumin 4.1 3.5 - 5.0 g/dL   AST 22 15 - 41 U/L   ALT 18 0 - 44 U/L   Alkaline Phosphatase 58 38 - 126 U/L   Total Bilirubin 1.2 0.3 - 1.2 mg/dL   GFR calc non Af Amer >60 >60 mL/min   GFR calc Af Amer >60 >60 mL/min   Anion gap 7 5 - 15  Troponin I     Status: None   Collection Time: 06/30/18 12:35 PM  Result  Value Ref Range   Troponin I <0.03 <0.03 ng/mL   ____________________________________________  EKG My review and personal interpretation at Time: 12:23   Indication: lightheadedness  Rate: 70  Rhythm: sinus Axis: normal Other: normal intervals, no stemi ____________________________________________  RADIOLOGY  I personally reviewed all radiographic images ordered to evaluate for the above acute complaints and reviewed radiology reports and findings.  These findings were personally discussed with the patient.  Please see medical record for radiology report.  ____________________________________________   PROCEDURES  Procedure(s) performed:  Procedures    Critical Care performed: no ____________________________________________   INITIAL IMPRESSION / ASSESSMENT AND PLAN / ED COURSE  Pertinent labs & imaging results that were available during my care of the patient were reviewed by me and considered in my medical decision making (see chart for details).   DDX: Dysrhythmia, dehydration, hypoglycemia, ACS, stress, anemia, unlikely seizure  Crystall Donaldson is a 57 y.o. who presents to the ED with symptoms as described above.  Patient is AFVSS in ED. Exam as above. Given current presentation have considered the above differential.  Reassuring exam.  The patient will be placed on continuous pulse oximetry and telemetry for monitoring.  Laboratory evaluation will be sent to evaluate for the above complaints.     Clinical Course as of Jun 30 1504  Mon Jun 30, 2018  1355 Patient reassessed.  Remains Heema dynamically stable.  No chest pain or palpitations.  No ectopy or dysrhythmia on telemetry monitoring.  Troponin is negative.  Blood work reassuring.  Patient states that she is being very anxious very stressed out that she was discharged from the hospital.  Do suspect this is a large component of her presentation.  Discussed follow-up with PCP regarding further discussion of stressors as well as possibility of medication management for anxiolysis or antidepression.  Have discussed with the patient and available family all diagnostics and treatments performed thus far and all questions were answered to the best of my ability. The patient demonstrates understanding and agreement with plan.    [PR]    Clinical Course User Index [PR] Merlyn Lot, MD     As part of my medical decision making, I reviewed the following data within the [facility] notes reviewed and incorporated, Labs reviewed, notes from prior ED visits.   ____________________________________________   FINAL CLINICAL IMPRESSION(S) / ED DIAGNOSES  Final diagnoses:  Near syncope      NEW MEDICATIONS STARTED DURING THIS VISIT:  Discharge Medication List as of 06/30/2018  1:58 PM       Note:  This document was prepared using Dragon voice recognition software and may include unintentional dictation errors.    Merlyn Lot, MD 06/30/18 1505

## 2018-06-30 NOTE — ED Triage Notes (Signed)
[place] Had near syncopal episode. Pt denies pain. VSS with EMS. Pt alert and oriented X4, active, cooperative, pt in NAD. RR even and unlabored, color WNL.

## 2018-07-01 ENCOUNTER — Encounter: Payer: Self-pay | Admitting: Dietician

## 2018-07-01 ENCOUNTER — Encounter: Payer: Self-pay | Admitting: *Deleted

## 2018-07-01 DIAGNOSIS — Z955 Presence of coronary angioplasty implant and graft: Secondary | ICD-10-CM

## 2018-07-01 DIAGNOSIS — I214 Non-ST elevation (NSTEMI) myocardial infarction: Secondary | ICD-10-CM

## 2018-07-01 NOTE — Progress Notes (Signed)
Daily Session Note  Patient Details  Name: Kelsey Terry MRN: [id number] Date of Birth: 26-Nov-1960 Referring Provider:     Cardiac Rehab from 06/09/2018 in [facility] Cardiac [facility] Pulmonary [facility]  Referring Provider  Kathlyn Sacramento MD      Encounter Date: 07/01/2018  Check In: Session Check In - 07/01/18 0929      Check-In   Supervising physician immediately available to respond to emergencies  See telemetry face sheet for immediately available ER MD    Location  [facility]-Cardiac & Pulmonary Rehab    Staff Present  Joellyn Rued, BS, PEC;Susanne Bice, RN, BSN, CCRP;Trishelle Devora [place], [place], RCEP, CCRP, Exercise Physiologist    Medication changes reported      No    Fall or balance concerns reported     No    Warm-up and Cool-down  Performed on first and last piece of equipment    Resistance Training Performed  Yes    VAD Patient?  No    PAD/SET Patient?  No      Pain Assessment   Currently in Pain?  No/denies          Social History   Tobacco Use  Smoking Status Never Smoker  Smokeless Tobacco Never Used    Goals Met:  Independence with exercise equipment Exercise tolerated well No report of cardiac concerns or symptoms Strength training completed today  Goals Unmet:  Not Applicable  Comments: Pt able to follow exercise prescription today without complaint.  Will continue to monitor for progression. Reviewed home exercise with pt today.  Pt plans to work out at her daughters apartment gym for exercise.  Reviewed THR, pulse, RPE, sign and symptoms, NTG use, and when to call 911 or MD.  Also discussed weather considerations and indoor options.  Pt voiced understanding.     Dr. Emily Filbert is Medical Director for [facility] and [facility].

## 2018-07-02 ENCOUNTER — Encounter: Payer: Self-pay | Admitting: *Deleted

## 2018-07-02 DIAGNOSIS — I214 Non-ST elevation (NSTEMI) myocardial infarction: Secondary | ICD-10-CM

## 2018-07-02 DIAGNOSIS — Z955 Presence of coronary angioplasty implant and graft: Secondary | ICD-10-CM

## 2018-07-02 NOTE — Progress Notes (Signed)
Cardiac Individual Treatment Plan  Patient Details  Name: Kelsey Terry MRN: [id number] Date of Birth: 03/15/1961 Referring Provider:     Cardiac Rehab from 06/09/2018 in [facility] Cardiac and Pulmonary Rehab  Referring Provider  Kathlyn Sacramento MD      Initial Encounter Date:    Cardiac Rehab from 06/09/2018 in [facility] Cardiac and Pulmonary Rehab  Date  06/09/18      Visit Diagnosis: NSTEMI (non-ST elevated myocardial infarction) [facility])  Status post coronary artery stent placement  Patient's Home Medications on Admission:  Current Outpatient Medications:  .  aspirin EC 81 MG EC tablet, Take 1 tablet (81 mg total) by mouth daily., Disp: 30 tablet, Rfl: 1 .  carvedilol (COREG) 12.5 MG tablet, Take 1 tablet (12.5 mg total) by mouth 2 (two) times daily with a meal., Disp: 60 tablet, Rfl: 1 .  losartan-hydrochlorothiazide (HYZAAR) 100-12.5 MG tablet, Take 1 tablet by mouth daily., Disp: 90 tablet, Rfl: 3 .  potassium chloride (K-DUR) 10 MEQ tablet, Take 20 mEq by mouth daily., Disp: , Rfl:  .  rosuvastatin (CRESTOR) 20 MG tablet, Take 1 tablet (20 mg total) by mouth daily at 6 PM., Disp: 30 tablet, Rfl: 1 .  ticagrelor (BRILINTA) 90 MG TABS tablet, Take 1 tablet (90 mg total) by mouth 2 (two) times daily., Disp: 60 tablet, Rfl: 1  Past Medical History: Past Medical History:  Diagnosis Date  . Asthma   . CAD in native artery    a. NSTEMI 05/16/18: [place] 05/16/18 - ost-pLAD 95% s/p PCI/DES, mLAD 30%, ostD1 70%, ost-pLCx 30%, mRCA 90%, dRCA 20%; b. staged PCI/DES to [facility] 7/1  . Diastolic dysfunction    a. TTE 05/16/18: EF 55-60%, no RWMA, Gr1DD, trivial AI, calcified mitral annulus, mild MR, mildly dilated LA  . Hypertension   . Lymphedema    LLE  . Pericarditis    a. ~ 2012 in [place], [place] s/p pericardiocentesis     Tobacco Use: Social History   Tobacco Use  Smoking Status Never Smoker  Smokeless Tobacco Never Used    Labs: Recent Review Scientist, physiological    Labs for ITP Cardiac and  Pulmonary Rehab Latest Ref Rng & Units 05/16/2018   Cholestrol 0 - 200 mg/dL 232(H)   LDLCALC 0 - 99 mg/dL 168(H)   HDL >40 mg/dL 55   Trlycerides <150 mg/dL 43   Hemoglobin A1c 4.8 - 5.6 % 5.7(H)       Exercise Target Goals: Exercise Program Goal: Individual exercise prescription set using results from initial 6 min walk test and THRR while considering  patient's activity barriers and safety.   Exercise Prescription Goal: Initial exercise prescription builds to 30-45 minutes a day of aerobic activity, 2-3 days per week.  Home exercise guidelines will be given to patient during program as part of exercise prescription that the participant will acknowledge.  Activity Barriers & Risk Stratification: Activity Barriers & Cardiac Risk Stratification - 06/09/18 1246      Activity Barriers & Cardiac Risk Stratification   Activity Barriers  Deconditioning;Muscular Weakness;Balance Concerns;Other (comment)    Comments  Lymphodema in bilater legs, wears support stockings    Cardiac Risk Stratification  High       6 Minute Walk: 6 Minute Walk    Row Name 06/09/18 1245         6 Minute Walk   Phase  Initial     Distance  1070 feet     Walk Time  6 minutes     #  of Rest Breaks  0     MPH  2.03     METS  3.15     RPE  11     VO2 Peak  11     Symptoms  No     Resting HR  57 bpm     Resting BP  132/58     Resting Oxygen Saturation   98 %     Exercise Oxygen Saturation  during 6 min walk  100 %     Max Ex. HR  112 bpm     Max Ex. BP  172/74     2 Minute Post BP  124/72        Oxygen Initial Assessment:   Oxygen Re-Evaluation:   Oxygen Discharge (Final Oxygen Re-Evaluation):   Initial Exercise Prescription: Initial Exercise Prescription - 06/09/18 1200      Date of Initial Exercise RX and Referring Provider   Date  06/09/18    Referring Provider  Kathlyn Sacramento MD      Treadmill   MPH  2    Grade  0.5    Minutes  15    METs  2.69      NuStep   Level  2     SPM  80    Minutes  15    METs  2      Recumbant Elliptical   Level  1    RPM  50    Minutes  15    METs  2      Prescription Details   Frequency (times per week)  2    Duration  Progress to 30 minutes of continuous aerobic without signs/symptoms of physical distress      Intensity   THRR 40-80% of Max Heartrate  99-141    Ratings of Perceived Exertion  11-13    Perceived Dyspnea  0-4      Progression   Progression  Continue to progress workloads to maintain intensity without signs/symptoms of physical distress.      Resistance Training   Training Prescription  Yes    Weight  3 lbs    Reps  10-15       Perform Capillary Blood Glucose checks as needed.  Exercise Prescription Changes: Exercise Prescription Changes    Row Name 06/09/18 1200 06/26/18 1100 07/01/18 1000         Response to Exercise   Blood Pressure (Admit)  132/58  120/68  -     Blood Pressure (Exercise)  172/74  134/70  -     Blood Pressure (Exit)  124/72  124/66  -     Heart Rate (Admit)  57 bpm  70 bpm  -     Heart Rate (Exercise)  112 bpm  103 bpm  -     Heart Rate (Exit)  59 bpm  63 bpm  -     Oxygen Saturation (Admit)  98 %  -  -     Oxygen Saturation (Exercise)  100 %  -  -     Rating of Perceived Exertion (Exercise)  11  13  -     Symptoms  none  none  -     Comments  walk test results  first full week of exercise   -     Duration  -  Continue with 30 min of aerobic exercise without signs/symptoms of physical distress.  -     Intensity  -  THRR unchanged  -  Progression   Progression  -  Continue to progress workloads to maintain intensity without signs/symptoms of physical distress.  -     Average METs  -  2.28  -       Resistance Training   Training Prescription  -  Yes  -     Weight  -  3 lbs  -     Reps  -  10-15  -       Interval Training   Interval Training  -  No  -       Treadmill   MPH  -  2  -     Grade  -  0.5  -     Minutes  -  15  -     METs  -  2.67  -        NuStep   Level  -  3  -     Minutes  -  15  -     METs  -  1.9  -       Home Exercise Plan   Plans to continue exercise at  -  -  [employer] (comment) apartment gym      Frequency  -  -  Add 3 additional days to program exercise sessions.     Initial Home Exercises Provided  -  -  07/01/18        Exercise Comments: Exercise Comments    Row Name 06/19/18 [phone]           Exercise Comments  First full day of exercise!  Patient was oriented to gym and equipment including functions, settings, policies, and procedures.  Patient's individual exercise prescription and treatment plan were reviewed.  All starting workloads were established based on the results of the 6 minute walk test done at initial orientation visit.  The plan for exercise progression was also introduced and progression will be customized based on patient's performance and goals.          Exercise Goals and Review: Exercise Goals    Row Name 06/09/18 1355             Exercise Goals   Increase Physical Activity  Yes       Intervention  Provide advice, education, support and counseling about physical activity/exercise needs.;Develop an individualized exercise prescription for aerobic and resistive training based on initial evaluation findings, risk stratification, comorbidities and participant's personal goals.       Expected Outcomes  Short Term: Attend rehab on a regular basis to increase amount of physical activity.;Long Term: Add in home exercise to make exercise part of routine and to increase amount of physical activity.;Long Term: Exercising regularly at least 3-5 days a week.       Increase Strength and Stamina  Yes       Intervention  Provide advice, education, support and counseling about physical activity/exercise needs.;Develop an individualized exercise prescription for aerobic and resistive training based on initial evaluation findings, risk stratification, comorbidities and participant's personal  goals.       Expected Outcomes  Short Term: Increase workloads from initial exercise prescription for resistance, speed, and METs.;Short Term: Perform resistance training exercises routinely during rehab and add in resistance training at home;Long Term: Improve cardiorespiratory fitness, muscular endurance and strength as measured by increased METs and functional capacity (6MWT)       Able to understand and use rate of perceived exertion (RPE) scale  Yes  Intervention  Provide education and explanation on how to use RPE scale       Expected Outcomes  Short Term: Able to use RPE daily in rehab to express subjective intensity level;Long Term:  Able to use RPE to guide intensity level when exercising independently       Able to understand and use Dyspnea scale  Yes       Intervention  Provide education and explanation on how to use Dyspnea scale       Expected Outcomes  Short Term: Able to use Dyspnea scale daily in rehab to express subjective sense of shortness of breath during exertion;Long Term: Able to use Dyspnea scale to guide intensity level when exercising independently       Knowledge and understanding of Target Heart Rate Range (THRR)  Yes       Intervention  Provide education and explanation of THRR including how the numbers were predicted and where they are located for reference       Expected Outcomes  Short Term: Able to state/look up THRR;Short Term: Able to use daily as guideline for intensity in rehab;Long Term: Able to use THRR to govern intensity when exercising independently       Able to check pulse independently  Yes       Intervention  Provide education and demonstration on how to check pulse in carotid and radial arteries.;Review the importance of being able to check your own pulse for safety during independent exercise       Expected Outcomes  Short Term: Able to explain why pulse checking is important during independent exercise;Long Term: Able to check pulse independently  and accurately       Understanding of Exercise Prescription  Yes       Intervention  Provide education, explanation, and written materials on patient's individual exercise prescription       Expected Outcomes  Short Term: Able to explain program exercise prescription;Long Term: Able to explain home exercise prescription to exercise independently          Exercise Goals Re-Evaluation : Exercise Goals Re-Evaluation    Row Name 06/19/18 5400 06/26/18 1127 07/01/18 1039         Exercise Goal Re-Evaluation   Exercise Goals Review  Understanding of Exercise Prescription;Knowledge and understanding of Target Heart Rate Range (THRR);Able to understand and use rate of perceived exertion (RPE) scale  Understanding of Exercise Prescription;Knowledge and understanding of Target Heart Rate Range (THRR);Able to understand and use rate of perceived exertion (RPE) scale  Understanding of Exercise Prescription;Knowledge and understanding of Target Heart Rate Range (THRR);Able to understand and use rate of perceived exertion (RPE) scale     Comments  Reviewed RPE scale, THR and program prescription with pt today.  Pt voiced understanding and was given a copy of goals to take home.   Tita has been doing well in rehab. She is walking on the treadmill as well as using the REL on level 3. She has done well during her first full week of exercise and seems to be gaining strength with the weights.  Reviewed home exercise with pt today.  Pt plans to work out at her daughters apartment gym for exercise.  Reviewed THR, pulse, RPE, sign and symptoms, NTG use, and when to call 911 or MD.  Also discussed weather considerations and indoor options.  Pt voiced understanding.     Expected Outcomes  Short: Use RPE daily to regulate intensity. Long: Follow program prescription in THR.  Short: continue to attend rehab regularly. Long: Increase overall MET level.   Short: add an additional 3 days of exercise at home. Long: increase  overall activity levels         Discharge Exercise Prescription (Final Exercise Prescription Changes): Exercise Prescription Changes - 07/01/18 1000      Home Exercise Plan   Plans to continue exercise at  [facility] (comment)   apartment gym    Frequency  Add 3 additional days to program exercise sessions.    Initial Home Exercises Provided  07/01/18       Nutrition:  Target Goals: Understanding of nutrition guidelines, daily intake of sodium <1560m, cholesterol <2088m calories 30% from fat and 7% or less from saturated fats, daily to have 5 or more servings of fruits and vegetables.  Biometrics: Pre Biometrics - 06/09/18 1249      Pre Biometrics   Height  5' 3.6" (1.615 m)    Weight  202 lb 3.2 oz (91.7 kg)    Waist Circumference  35 inches    Hip Circumference  44 inches    Waist to Hip Ratio  0.8 %    BMI (Calculated)  35.16    Single Leg Stand  1.41 seconds        Nutrition Therapy Plan and Nutrition Goals: Nutrition Therapy & Goals - 07/01/18 1240      Nutrition Therapy   Diet  DASH    Protein (specify units)  10oz    Fiber  25 grams    Whole Grain Foods  3 servings   does not always choose whole grains   Saturated Fats  13 max. grams    Fruits and Vegetables  5 servings/day   8 ideal, recently increased her f&v intake   Sodium  1500 grams      Personal Nutrition Goals   Nutrition Goal  When eating frozen meals, keep sodium below 60070mer meal and if you need to increase satiety/fullness you can add an additional serving of vegetables to the meal    Personal Goal #2  Practice being more aware of the sodium content in foods by reading nutrition facts labels. For you, this will be especially important for canned items, deli meats, and frozen meals.     Personal Goal #3  When eating out, look for ways to increase the nutritional value and decrease the sodium and fat content of the meal. For example, choosing steamed rather than fried entrees at a  [employer] ordering vegetables / a plain baked potato as a side instead of mashed potatoes or french fries    Personal Goal #4  Prioritize protein at meal times; choose lean/ non fried varieties most often    Comments  She has been eating mostly fruits and vegetables recently d/t being confused about what foods to eat when following a heart-healthy eating plan. She has been eating frozen meals frequently but tries to choose Healthy Choice or Lean Cuisine brands. She does not consume sugar sweetened beverages often but eats a lot of canned items.      Intervention Plan   Intervention  Prescribe, educate and counsel regarding individualized specific dietary modifications aiming towards targeted core components such as weight, hypertension, lipid management, diabetes, heart failure and other comorbidities.;Nutrition handout(s) given to patient.   low sodium nutrition therapy, guidelines for heart healthy eating handout   Expected Outcomes  Short Term Goal: Understand basic principles of dietary content, such as calories, fat, sodium, cholesterol and  nutrients.;Short Term Goal: A plan has been developed with personal nutrition goals set during dietitian appointment.;Long Term Goal: Adherence to prescribed nutrition plan.       Nutrition Assessments: Nutrition Assessments - 06/09/18 1311      MEDFICTS Scores   Pre Score  36       Nutrition Goals Re-Evaluation: Nutrition Goals Re-Evaluation    Row Name 07/01/18 1309             Goals   Nutrition Goal  When eating frozen meals, keep sodium below 665m per meal and if you need to increase satiety/fullness you can add an additional serving of vegetables to the meal       Comment  She has been eating frozen meals for lunch and dinner regularly as of late but is unsure of what constitutes as a "healty" choice when reading the label       Expected Outcome  She will choose meals with less than 6071msodium, equal to or greater than 15g  protein, zero trans fat and minimal saturated fat         Personal Goal #2 Re-Evaluation   Personal Goal #2  Practice being more aware of the sodium content in foods by reading nutrition facts labels. For you, this will be especially important for canned items, deli meat and frozen meals         Personal Goal #3 Re-Evaluation   Personal Goal #3  When eating out, look for ways to increase the nutritional value and decrease the sodium and fat content of the meal. For example, choosing steamed rather than fried entrees at [employer] ordering vegetables/ a plain baked potato as a side instead of mashed potaotes or french fries         Personal Goal #4 Re-Evaluation   Personal Goal #4  Prioritize protein at meal times; choose lean / non fried varieties most often          Nutrition Goals Discharge (Final Nutrition Goals Re-Evaluation): Nutrition Goals Re-Evaluation - 07/01/18 1309      Goals   Nutrition Goal  When eating frozen meals, keep sodium below 60063mer meal and if you need to increase satiety/fullness you can add an additional serving of vegetables to the meal    Comment  She has been eating frozen meals for lunch and dinner regularly as of late but is unsure of what constitutes as a "healty" choice when reading the label    Expected Outcome  She will choose meals with less than 600m24mdium, equal to or greater than 15g protein, zero trans fat and minimal saturated fat      Personal Goal #2 Re-Evaluation   Personal Goal #2  Practice being more aware of the sodium content in foods by reading nutrition facts labels. For you, this will be especially important for canned items, deli meat and frozen meals      Personal Goal #3 Re-Evaluation   Personal Goal #3  When eating out, look for ways to increase the nutritional value and decrease the sodium and fat content of the meal. For example, choosing steamed rather than fried entrees at [employer]/ a  plain baked potato as a side instead of mashed potaotes or french fries      Personal Goal #4 Re-Evaluation   Personal Goal #4  Prioritize protein at meal times; choose lean / non fried varieties most often       Psychosocial: Target Goals: Acknowledge  presence or absence of significant depression and/or stress, maximize coping skills, provide positive support system. Participant is able to verbalize types and ability to use techniques and skills needed for reducing stress and depression.   Initial Review & Psychosocial Screening: Initial Psych Review & Screening - 06/09/18 1311      Initial Review   Current issues with  Current Stress Concerns;Current Sleep Concerns   Telisa is currently sleeping on her daughter's couch until she finds a place of her own   Source of Stress Concerns  Family;Financial;Poor Coping Skills    Comments  Katelee just moved here in May from [place]. She moved here with her son to be closer to her daughter. 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 She does not like to rely on her children to help her, but whenever she tries to drive her anxiety "acts up" and she ends up having her children drive her.       Family Dynamics   Good Support System?  Yes   family     Barriers   Psychosocial barriers to participate in program  There are no identifiable barriers or psychosocial needs.;The patient should benefit from training in stress management and relaxation.      Screening Interventions   Interventions  Encouraged to exercise;Provide feedback about the scores to participant;Program counselor consult;To provide support and resources with identified psychosocial needs    Expected Outcomes  Short Term goal: Utilizing psychosocial counselor, staff and physician to assist with identification of specific Stressors or current issues interfering with healing process. Setting desired goal for each stressor or current issue  identified.;Long Term Goal: Stressors or current issues are controlled or eliminated.;Short Term goal: Identification and review with participant of any Quality of Life or Depression concerns found by scoring the questionnaire.;Long Term goal: The participant improves quality of Life and PHQ9 Scores as seen by post scores and/or verbalization of changes       Quality of Life Scores:  Quality of Life - 06/09/18 1058      Quality of Life   Select  Quality of Life      Quality of Life Scores   Health/Function Pre  19.61 %    Socioeconomic Pre  6.2 %    Psych/Spiritual Pre  20.57 %    Family Pre  16.13 %    GLOBAL Pre  17.3 %      Scores of 19 and below usually indicate a poorer quality of life in these areas.  A difference of  2-3 points is a clinically meaningful difference.  A difference of 2-3 points in the total score of the Quality of Life Index has been associated with significant improvement in overall quality of life, self-image, physical symptoms, and general health in studies assessing change in quality of life.  PHQ-9: Recent Review Flowsheet Data    Depression screen [facility] 2/9 06/09/2018   Decreased Interest 0   Down, Depressed, Hopeless 0   PHQ - 2 Score 0   Altered sleeping 0   Tired, decreased energy 1   Change in appetite 1   Feeling bad or failure about yourself  1   Trouble concentrating 1   Moving slowly or fidgety/restless 0   Suicidal thoughts 1    PHQ-9 Score 5   Difficult doing work/chores Somewhat difficult     Interpretation of Total Score  Total Score Depression Severity:  1-4 = Minimal depression, 5-9 =  Mild depression, 10-14 = Moderate depression, 15-19 = Moderately severe depression, 20-27 = Severe depression   Psychosocial Evaluation and Intervention: Psychosocial Evaluation - 06/24/18 1047      Psychosocial Evaluation & Interventions   Interventions  Encouraged to exercise with the program and follow exercise prescription;Stress management  education;Relaxation education    Comments  Counselor met with Ms. Harrington Challenger [name]) today for initial psychosocial evaluation.  She is a 57 year old female who had a heart attack and (2) stents inserted in July.  Maleiyah has limited supports having just moved here from [place] and lives with her daughter and son, Rodman Key, who accompanied her to this meeting.  She reports sleeping "fair" and having a good appetite.  Gerri denies a history of depression but admits to some symptoms of anxiety - particularly with the recent stress of the move and her health.  She also reported having some thoughts of suicide several weeks ago but no current thoughts or plans.  Counselor encouraged Kaelynn and her son to go to the ED if these thoughts return or worsen since she does not have a PCP yet in place.  Stress in Arleth's life are her health; the move; retirement; and particularly finances since she is too young to draw retirement or SS.  She also has conflict with her daughter whom she lives with.  Jessi was encouraged by this counselor to practice positive self-care by coming to exercise consistently and trying to find resources to be able to live outside her daughter's home in the near future.  Counselor gave [employer] on Stress management and learning to say "no" to others.  She agreed to set her health and her positive self-care as her goals and priorities at this time.  Counselor will follow with [name] - along with staff in this program.     Expected Outcomes  Short:  Esmae will go to the ED if her symptoms of depression or anxiety worsen.  She will exercise consistently to improve her mood and her energy levels.  Long:  Stuart will develop a positive self-care plan and utilize it consistently for her health and mental health.      Continue Psychosocial Services   Follow up required by counselor       Psychosocial Re-Evaluation:   Psychosocial Discharge (Final Psychosocial Re-Evaluation):   Vocational  Rehabilitation: Provide vocational rehab assistance to qualifying candidates.   Vocational Rehab Evaluation & Intervention: Vocational Rehab - 06/09/18 1320      Initial Vocational Rehab Evaluation & Intervention   Assessment shows need for Vocational Rehabilitation  Yes    Vocational Rehab Packet given to patient  --   Will give her first day of class      Education: Education Goals: Education classes will be provided on a variety of topics geared toward better understanding of heart health and risk factor modification. Participant will state understanding/return demonstration of topics presented as noted by education test scores.  Learning Barriers/Preferences: Learning Barriers/Preferences - 06/09/18 1318      Learning Barriers/Preferences   Learning Barriers  None    Learning Preferences  Verbal Instruction       Education Topics:  AED/CPR: - Group verbal and written instruction with the use of models to demonstrate the basic use of the AED with the basic ABC's of resuscitation.   General Nutrition Guidelines/Fats and Fiber: -Group instruction provided by verbal, written material, models and posters to present the general guidelines for heart healthy nutrition. Gives an explanation and  review of dietary fats and fiber.   Controlling Sodium/Reading Food Labels: -Group verbal and written material supporting the discussion of sodium use in heart healthy nutrition. Review and explanation with models, verbal and written materials for utilization of the food label.   Exercise Physiology & General Exercise Guidelines: - Group verbal and written instruction with models to review the exercise physiology of the cardiovascular system and associated critical values. Provides general exercise guidelines with specific guidelines to those with heart or lung disease.    Aerobic Exercise & Resistance Training: - Gives group verbal and written instruction on the various components of  exercise. Focuses on aerobic and resistive training programs and the benefits of this training and how to safely progress through these programs..   Cardiac Rehab from 07/01/2018 in [facility] Cardiac and Pulmonary Rehab  Date  06/26/18  Educator  AS  Instruction Review Code  1- Verbalizes Understanding      Flexibility, Balance, Mind/Body Relaxation: Provides group verbal/written instruction on the benefits of flexibility and balance training, including mind/body exercise modes such as yoga, pilates and tai chi.  Demonstration and skill practice provided.   Cardiac Rehab from 07/01/2018 in [facility] Cardiac and Pulmonary Rehab  Date  07/01/18  Educator  AS  Instruction Review Code  1- Verbalizes Understanding      Stress and Anxiety: - Provides group verbal and written instruction about the health risks of elevated stress and causes of high stress.  Discuss the correlation between heart/lung disease and anxiety and treatment options. Review healthy ways to manage with stress and anxiety.   Depression: - Provides group verbal and written instruction on the correlation between heart/lung disease and depressed mood, treatment options, and the stigmas associated with seeking treatment.   Cardiac Rehab from 07/01/2018 in [facility] Cardiac and Pulmonary Rehab  Date  06/24/18  Educator  [facility]  Instruction Review Code  1- Verbalizes Understanding      Anatomy & Physiology of the Heart: - Group verbal and written instruction and models provide basic cardiac anatomy and physiology, with the coronary electrical and arterial systems. Review of Valvular disease and Heart Failure   Cardiac Procedures: - Group verbal and written instruction to review commonly prescribed medications for heart disease. Reviews the medication, class of the drug, and side effects. Includes the steps to properly store meds and maintain the prescription regimen. (beta blockers and nitrates)   Cardiac Medications I: - Group verbal and  written instruction to review commonly prescribed medications for heart disease. Reviews the medication, class of the drug, and side effects. Includes the steps to properly store meds and maintain the prescription regimen.   Cardiac Medications II: -Group verbal and written instruction to review commonly prescribed medications for heart disease. Reviews the medication, class of the drug, and side effects. (all other drug classes)    Go Sex-Intimacy & Heart Disease, Get SMART - Goal Setting: - Group verbal and written instruction through game format to discuss heart disease and the return to sexual intimacy. Provides group verbal and written material to discuss and apply goal setting through the application of the S.M.A.R.T. Method.   Other Matters of the Heart: - Provides group verbal, written materials and models to describe Stable Angina and Peripheral Artery. Includes description of the disease process and treatment options available to the cardiac patient.   Exercise & Equipment Safety: - Individual verbal instruction and demonstration of equipment use and safety with use of the equipment.   Cardiac Rehab from 07/01/2018 in [facility]  Cardiac and Pulmonary Rehab  Date  06/09/18  Educator  [facility]  Instruction Review Code  1- Verbalizes Understanding      Infection Prevention: - Provides verbal and written material to individual with discussion of infection control including proper hand washing and proper equipment cleaning during exercise session.   Cardiac Rehab from 07/01/2018 in [facility] Cardiac and Pulmonary Rehab  Date  06/09/18  Educator  [facility]  Instruction Review Code  1- Verbalizes Understanding      Falls Prevention: - Provides verbal and written material to individual with discussion of falls prevention and safety.   Cardiac Rehab from 07/01/2018 in [facility] Cardiac and Pulmonary Rehab  Date  06/09/18  Educator  [facility]  Instruction Review Code  1- Verbalizes Understanding       Diabetes: - Individual verbal and written instruction to review signs/symptoms of diabetes, desired ranges of glucose level fasting, after meals and with exercise. Acknowledge that pre and post exercise glucose checks will be done for 3 sessions at entry of program.   Know Your Numbers and Risk Factors: -Group verbal and written instruction about important numbers in your health.  Discussion of what are risk factors and how they play a role in the disease process.  Review of Cholesterol, Blood Pressure, Diabetes, and BMI and the role they play in your overall health.   Sleep Hygiene: -Provides group verbal and written instruction about how sleep can affect your health.  Define sleep hygiene, discuss sleep cycles and impact of sleep habits. Review good sleep hygiene tips.    Cardiac Rehab from 07/01/2018 in [facility] Cardiac and Pulmonary Rehab  Date  06/19/18  Educator  [facility]  Instruction Review Code  1- Verbalizes Understanding      Other: -Provides group and verbal instruction on various topics (see comments)   Knowledge Questionnaire Score: Knowledge Questionnaire Score - 06/09/18 1057      Knowledge Questionnaire Score   Pre Score  21/26   Test reviewed with pt today.  Education Focus: Nutrition, Exercise,  Anatomy, HTN, Heart Failure      Core Components/Risk Factors/Patient Goals at Admission: Personal Goals and Risk Factors at Admission - 06/09/18 1306      Core Components/Risk Factors/Patient Goals on Admission    Weight Management  Yes;Weight Loss    Intervention  Weight Management: Develop a combined nutrition and exercise program designed to reach desired caloric intake, while maintaining appropriate intake of nutrient and fiber, sodium and fats, and appropriate energy expenditure required for the weight goal.;Weight Management: Provide education and appropriate resources to help participant work on and attain dietary goals.;Weight Management/Obesity: Establish reasonable  short term and long term weight goals.    Admit Weight  202 lb (91.6 kg)    Goal Weight: Short Term  197 lb (89.4 kg)    Goal Weight: Long Term  190 lb (86.2 kg)    Expected Outcomes  Short Term: Continue to assess and modify interventions until short term weight is achieved;Long Term: Adherence to nutrition and physical activity/exercise program aimed toward attainment of established weight goal;Weight Loss: Understanding of general recommendations for a balanced deficit meal plan, which promotes 1-2 lb weight loss per week and includes a negative energy balance of [phone] kcal/d;Understanding recommendations for meals to include 15-35% energy as protein, 25-35% energy from fat, 35-60% energy from carbohydrates, less than 267m of dietary cholesterol, 20-35 gm of total fiber daily;Understanding of distribution of calorie intake throughout the day with the consumption of 4-5 meals/snacks  Hypertension  Yes    Intervention  Provide education on lifestyle modifcations including regular physical activity/exercise, weight management, moderate sodium restriction and increased consumption of fresh fruit, vegetables, and low fat dairy, alcohol moderation, and smoking cessation.;Monitor prescription use compliance.    Expected Outcomes  Short Term: Continued assessment and intervention until BP is < 140/69m HG in hypertensive participants. < 130/824mHG in hypertensive participants with diabetes, heart failure or chronic kidney disease.;Long Term: Maintenance of blood pressure at goal levels.    Lipids  Yes    Intervention  Provide education and support for participant on nutrition & aerobic/resistive exercise along with prescribed medications to achieve LDL <7044mHDL >14m25m  Expected Outcomes  Short Term: Participant states understanding of desired cholesterol values and is compliant with medications prescribed. Participant is following exercise prescription and nutrition guidelines.;Long Term: Cholesterol  controlled with medications as prescribed, with individualized exercise RX and with personalized nutrition plan. Value goals: LDL < 70mg10mL > 40 mg.       Core Components/Risk Factors/Patient Goals Review:    Core Components/Risk Factors/Patient Goals at Discharge (Final Review):    ITP Comments: ITP Comments    Row Name 06/09/18 1305 07/02/18 0851         ITP Comments  Med Review completed. Initial ITP created. Diagnosis can be found in [facility]  30 day review completed. ITP sent to Dr. Bert Ramonita Labering for Dr. Mark Emily Filbertical Director of Cardiac Rehab. Continue with ITP unless changes are made by physician.  New to program         Comments: 30 day review

## 2018-07-03 ENCOUNTER — Encounter: Payer: Self-pay | Admitting: *Deleted

## 2018-07-03 DIAGNOSIS — Z955 Presence of coronary angioplasty implant and graft: Secondary | ICD-10-CM

## 2018-07-03 DIAGNOSIS — I214 Non-ST elevation (NSTEMI) myocardial infarction: Secondary | ICD-10-CM

## 2018-07-03 NOTE — Progress Notes (Signed)
Daily Session Note  Patient Details  Name: Kelsey Terry MRN: [id number] Date of Birth: Sep 19, 1961 Referring Provider:     Cardiac Rehab from 06/09/2018 in [facility] Cardiac and Pulmonary Rehab  Referring Provider  Kathlyn Sacramento MD      Encounter Date: 07/03/2018  Check In: Session Check In - 07/03/18 0937      Check-In   Supervising physician immediately available to respond to emergencies  See telemetry face sheet for immediately available ER MD    Location  [facility]-Cardiac & Pulmonary Rehab    Staff Present  Joellyn Rued, BS, PEC;Carroll Enterkin, RN, BSN;Eldor Conaway [place], MA, RCEP, CCRP, Exercise Physiologist    Medication changes reported      No    Fall or balance concerns reported     No    Warm-up and Cool-down  Performed on first and last piece of equipment    Resistance Training Performed  Yes    VAD Patient?  No    PAD/SET Patient?  No      Pain Assessment   Currently in Pain?  No/denies          Social History   Tobacco Use  Smoking Status Never Smoker  Smokeless Tobacco Never Used    Goals Met:  Independence with exercise equipment Exercise tolerated well No report of cardiac concerns or symptoms Strength training completed today  Goals Unmet:  Not Applicable  Comments: Pt able to follow exercise prescription today without complaint.  Will continue to monitor for progression.    Dr. Emily Filbert is Medical Director for [facility].

## 2018-07-07 NOTE — Progress Notes (Signed)
Cardiology Office Note Date:  07/08/2018  Patient ID:  Kelsey Terry, DOB 27-May-1961, MRN [id number] PCP:  Patient, No Pcp Per  Cardiologist:  Dr. Fletcher Anon, MD    Chief Complaint: Follow up  History of Present Illness: Kelsey Terry is a 57 y.o. female with history of CAD s/p recent NSTEMI as detailed below, pericarditis s/p pericardiocentesis ~ 7-8 years prior in [place], diastolic dysfunction, near syncope, HTN, asthma, and lymphedema who presents for follow up of her CAD.   Patient was previously followed by cardiology in [place]. She reports being admitted to [facility] in [place] ~ 7-8 years prior with substernal chest pain with workup revealing pericarditis. She underwent successful pericardiocentesis. She has relocated to [place] to be closer to her daughter. She was admitted to [facility] 6/28 with severe, substernal chest pain and SOB. She was found to have a NSTEMI with peak troponin of 0.38. EKG showed anterolateral st changes, not meeting stemi criteria. She underwent LHC on 05/16/2018 that showed severe two-vessel CAD with a ostial to proximal LAD 95%, mid LAD 30%, ostial D1 70%, ostial to proximal LCx 30%, mid RCA 90%, distal RCA 20%. She underwent successful PCI/DES to the ostial to proximal LAD with 0% residual stenosis. Following the cath, she complained of confusion/slurred speech. CT head was not acute. CTA head could not be completed due lack of cooperation and persistent nausea/vomiting. Neurology evaluated the patient and stroke was felt to be less likely. Echo showed an EF of 55-60%, no RWMA, Gr1DD, trivial AI, calcified mitral annulus, mild MR, mildly dilated LA. Carotid ultrasound showed mild bilateral carotid stenosis of < 50%. She underwent staged PCI of the mid RCA on 7/1 with 0% residual stenosis. During this staged procedure (right femoral artery access was obtained given vasospasm noted with right radial artery access during her initial cath on 6/28) she was noted to have  evidence of an occluded SFA, which seemed to be chronic with outpatient evaluation being recommended. During closure device deployment she was noted to have had a vagal reaction that responded well to atropine, IV fluids, and Zofran. She was changed from Lipitor to Crestor given concerns for possible headache. She also noted a possible syncopal episode following a shower and positional change. This was felt to possibly be vasovagal in etiology. In hospital follow up on 05/27/18 she was doing well from a cardiac perspective. She denied any further syncopal episodes and was tolerating/compliant with all medications. Labs checked at that time showed a stable HGB of 12.3. SCr had trended up to 1.01 from a discharge value of 0.78. Her Hyzaar was decreased to 100/12.5 mg daily. She has been participating in cardiac rehab. She was seen in the ED on 06/30/2018 with near syncope while standing in line at the post office. She had not eaten much that morning. Work up showed a troponin negative x 1, Na 140, K+ 3.5, glucose 109, SCr 0.92, normal liver function, WBC 5.5, HGB 12.9. EKG without acute abnormalities. There was some concern of anxiety playing a role. Outpatient follow up was advised.   She comes in accompanied by her daughter today. She is doing reasonably well from a cardiac perspective. She continues to participate in cardiac rehab and can see her progress. She has not had any episodes of chest pain or SOB concerning for angina. She has not had any further episodes of near syncope as above. She has self-discontinued Coreg as of 07/02/18 as she felt like this medication was leading her her  near syncopal events as well as some numbness affecting the right foot. Since stopping Coreg, she has not had any symptoms concerning for near syncope and her right foot numbness has resolved. She denies any claudication-like symptoms. She is otherwise compliant with her medications including DAPT and has not missed any doses. BP at  home and in cardiac rehab has been running in the 834H systolic. She has not had any falls. No BRBPR or melena. She does note two quarter-sized bruises along the anterior aspect of her right thigh. Of note, she thinks there may have been some associated palpitations with her above near syncopal event that led to her presenting to the ED on 8/12. She continues to note fatigue and feels like this is limiting her ability to get a job. She notes a sensation of "feeling drained" by 2-3 PM. However, this is somewhat improved when compared to her last being seen here.   Past Medical History:  Diagnosis Date  . Asthma   . CAD in native artery    a. NSTEMI 05/16/18: [place] 05/16/18 - ost-pLAD 95% s/p PCI/DES, mLAD 30%, ostD1 70%, ost-pLCx 30%, mRCA 90%, dRCA 20%; b. staged PCI/DES to [facility] 7/1  . Diastolic dysfunction    a. TTE 05/16/18: EF 55-60%, no RWMA, Gr1DD, trivial AI, calcified mitral annulus, mild MR, mildly dilated LA  . Hypertension   . Lymphedema    LLE  . Pericarditis    a. ~ 2012 in [place], [place] s/p pericardiocentesis     Past Surgical History:  Procedure Laterality Date  . CESAREAN SECTION    . CORONARY STENT INTERVENTION N/A 05/16/2018   Procedure: CORONARY STENT INTERVENTION;  Surgeon: Wellington Hampshire, MD;  Location: [place] CV LAB;  Service: Cardiovascular;  Laterality: N/A;  . CORONARY STENT INTERVENTION N/A 05/19/2018   Procedure: CORONARY STENT INTERVENTION;  Surgeon: Wellington Hampshire, MD;  Location: [place] CV LAB;  Service: Cardiovascular;  Laterality: N/A;  . LEFT HEART CATH AND CORONARY ANGIOGRAPHY N/A 05/16/2018   Procedure: LEFT HEART CATH AND CORONARY ANGIOGRAPHY;  Surgeon: Wellington Hampshire, MD;  Location: [place] CV LAB;  Service: Cardiovascular;  Laterality: N/A;  . PERICARDIOCENTESIS      Current Meds  Medication Sig  . aspirin EC 81 MG EC tablet Take 1 tablet (81 mg total) by mouth daily.  . carvedilol (COREG) 12.5 MG tablet Take 1 tablet (12.5 mg  total) by mouth 2 (two) times daily with a meal.  . losartan-hydrochlorothiazide (HYZAAR) 100-12.5 MG tablet Take 1 tablet by mouth daily.  . potassium chloride (K-DUR) 10 MEQ tablet Take 20 mEq by mouth daily.  . rosuvastatin (CRESTOR) 20 MG tablet Take 1 tablet (20 mg total) by mouth daily at 6 PM.  . ticagrelor (BRILINTA) 90 MG TABS tablet Take 1 tablet (90 mg total) by mouth 2 (two) times daily.    Allergies:   Amoxicillin   Social History:  The patient  reports that she has never smoked. She has never used smokeless tobacco. She reports that she drank alcohol. She reports that she does not use drugs.   Family History:  The patient's family history includes Hypertension in her mother; Pancreatic cancer in her father.  ROS:   Review of Systems  Constitutional: Positive for diaphoresis and malaise/fatigue. Negative for chills, fever and weight loss.  HENT: Negative for congestion.   Eyes: Negative for discharge and redness.  Respiratory: Negative for cough, hemoptysis, sputum production, shortness of breath and wheezing.   Cardiovascular:  Positive for palpitations and leg swelling. Negative for chest pain, orthopnea, claudication and PND.  Gastrointestinal: Negative for abdominal pain, blood in stool, heartburn, melena, nausea and vomiting.  Genitourinary: Negative for hematuria.  Musculoskeletal: Negative for falls and myalgias.  Skin: Negative for rash.  Neurological: Positive for dizziness and weakness. Negative for tingling, tremors, sensory change, speech change, focal weakness and loss of consciousness.  Endo/Heme/Allergies: Does not bruise/bleed easily.  Psychiatric/Behavioral: Negative for substance abuse. The patient is nervous/anxious.      PHYSICAL EXAM:  VS:  BP 140/76 (BP Location: Left Arm, Patient Position: Sitting, Cuff Size: Normal)   Pulse 71   Ht 5\' 3"  (1.6 m)   Wt 203 lb 4 oz (92.2 kg)   BMI 36.00 kg/m  BMI: Body mass index is 36 kg/m.  Physical Exam    Constitutional: She is oriented to person, place, and time. She appears well-developed and well-nourished.  HENT:  Head: Normocephalic and atraumatic.  Eyes: Right eye exhibits no discharge. Left eye exhibits no discharge.  Neck: Normal range of motion. No JVD present.  Cardiovascular: Normal rate, regular rhythm, S1 normal, S2 normal and normal heart sounds. Exam reveals no distant heart sounds, no friction rub, no midsystolic click and no opening snap.  No murmur heard. Pulses:      Dorsalis pedis pulses are 1+ on the right side.       Posterior tibial pulses are 2+ on the right side, and 2+ on the left side.  2 separate quarter-sized bruises noted along the anterior aspect of her right thigh.   Pulmonary/Chest: Effort normal and breath sounds normal. No respiratory distress. She has no decreased breath sounds. She has no wheezes. She has no rales. She exhibits no tenderness.  Abdominal: Soft. She exhibits no distension. There is no tenderness.  Musculoskeletal: She exhibits edema.  Chronic lymphedema noted bilaterally.   Neurological: She is alert and oriented to person, place, and time.  Skin: Skin is warm and dry. No cyanosis. Nails show no clubbing.  Psychiatric: She has a normal mood and affect. Her speech is normal and behavior is normal. Judgment and thought content normal.     EKG:  Was ordered and interpreted by me today. Shows NSR, 71 bpm, inferior Q waves, no acute st/t changes  Recent Labs: 05/16/2018: TSH 1.115 05/17/2018: Magnesium 1.9 06/30/2018: ALT 18; BUN 19; Creatinine, Ser 0.92; Hemoglobin 12.9; Platelets 240; Potassium 3.5; Sodium 140  05/16/2018: Cholesterol 232; HDL 55; LDL Cholesterol 168; Total CHOL/HDL Ratio 4.2; Triglycerides 43; VLDL 9   Estimated Creatinine Clearance: 71.9 mL/min (by C-G formula based on SCr of 0.92 mg/dL).   Wt Readings from Last 3 Encounters:  07/08/18 203 lb 4 oz (92.2 kg)  06/30/18 200 lb 9.9 oz (91 kg)  06/09/18 202 lb 3.2 oz (91.7  kg)     Other studies reviewed: Additional studies/records reviewed today include: summarized above  ASSESSMENT AND PLAN:  1. CAD involving the native coronary arteries without angina: Doing well. Continue DAPT with ASA 81 mg daily and Brilinta 90 mg bid without interruption through at least 05/2019. She has self discontinued Coreg as detailed in the HPI. She prefers not to resume an alternative beta blocker at this time. Crestor as below. Cardiac rehab. No plans for ischemic evaluation at this time.  2. Near syncope: Noted to have prior episodes since her MI as detailed in the HPI that have been felt to be vagally-mediated. Schedule a 30-day event monitor to evaluate for arrhythmia or  significant pause. Recent echo demonstrating preserved EF. Recent carotid artery ultrasound showing no hemodynamically significant stenoses. She feels much better after self-discontinuing Coreg. There was some concern her poor PO intake noted in the ED on 8/12 may have been playing a role in her near syncope. This potentially could have been exacerbated by relative hypotension. Continue off Coreg for now as her symptoms have improved off beta blocker, and per patient wishes.    3. PAD/paresthesias of the right foot: Incidentally noted to have what appeared to be a chronically occluded right SFA during [place] on 05/19/2018. Paresthesia of the right foot has resolved with self discontinuation of Coreg. Schedule lower extremity arterial ultrasound/ABI. Optimal lipid control as below.   4. HTN: Blood pressure is suboptimally controlled today at 140/76. BP typically has been well controlled at home and in cardiac rehab. She prefers to avoid any further beta blockers at this time. Continue Hyzaar 100.12.5 mg daily.   5. Fatigue: Likely multifactorial including her recent MI, hospitalization, morbid obesity, and physical deconditioning. Cannot exclude some component of depression as well. Recommend she continue with cardiac rehab,  her job search, and follow up with her PCP.  6. Bruising: Check CBC today given she is on DAPT.   7. HLD: Most recent LDL of 168 with a goal LDL of < 70. Check lipid and lvier function today. Continue Crestor 20 mg daily.   Disposition: F/u with Dr. Fletcher Anon after results of her event monitor are back.   Current medicines are reviewed at length with the patient today.  The patient did not have any concerns regarding medicines.  Signed, Christell Faith, PA-C 07/08/2018 2:31 PM     [facility] [address] [place], [place] [address] [phone]

## 2018-07-08 ENCOUNTER — Encounter: Payer: Self-pay | Admitting: Physician Assistant

## 2018-07-08 ENCOUNTER — Encounter: Payer: Self-pay | Admitting: *Deleted

## 2018-07-08 ENCOUNTER — Ambulatory Visit (INDEPENDENT_AMBULATORY_CARE_PROVIDER_SITE_OTHER): Payer: Self-pay | Admitting: Physician Assistant

## 2018-07-08 ENCOUNTER — Telehealth: Payer: Self-pay | Admitting: Physician Assistant

## 2018-07-08 VITALS — BP 140/76 | HR 71 | Ht 63.0 in | Wt 203.2 lb

## 2018-07-08 DIAGNOSIS — E785 Hyperlipidemia, unspecified: Secondary | ICD-10-CM

## 2018-07-08 DIAGNOSIS — I70209 Unspecified atherosclerosis of native arteries of extremities, unspecified extremity: Secondary | ICD-10-CM

## 2018-07-08 DIAGNOSIS — I10 Essential (primary) hypertension: Secondary | ICD-10-CM

## 2018-07-08 DIAGNOSIS — I214 Non-ST elevation (NSTEMI) myocardial infarction: Secondary | ICD-10-CM

## 2018-07-08 DIAGNOSIS — R5383 Other fatigue: Secondary | ICD-10-CM

## 2018-07-08 DIAGNOSIS — T148XXA Other injury of unspecified body region, initial encounter: Secondary | ICD-10-CM

## 2018-07-08 DIAGNOSIS — R55 Syncope and collapse: Secondary | ICD-10-CM

## 2018-07-08 DIAGNOSIS — I739 Peripheral vascular disease, unspecified: Secondary | ICD-10-CM

## 2018-07-08 DIAGNOSIS — Z955 Presence of coronary angioplasty implant and graft: Secondary | ICD-10-CM

## 2018-07-08 DIAGNOSIS — I2510 Atherosclerotic heart disease of native coronary artery without angina pectoris: Secondary | ICD-10-CM

## 2018-07-08 NOTE — Patient Instructions (Addendum)
Medication Instructions:  Your physician has recommended you make the following change in your medication:  1- STOP Carvedilol.   Labwork: Your physician recommends that you return for lab work in: CBC, LIPID, LIVER today.   Testing/Procedures: Your physician has requested that you have a lower extremity arterial doppler- During this test, ultrasound is used to evaluate arterial blood flow in the legs. Allow approximately one hour for this exam.   Your physician has requested that you have an ankle brachial index (ABI). During this test an ultrasound and blood pressure cuff are used to evaluate the arteries that supply the arms and legs with blood. Allow thirty minutes for this exam. There are no restrictions or special instructions.   Your physician has recommended that you wear an 30-DAY [employer] event monitor. Event monitors are medical devices that record the heart's electrical activity. Doctors most often [place] these monitors to diagnose arrhythmias. Arrhythmias are problems with the speed or rhythm of the heartbeat. The monitor is a small, portable device. You can wear one while you do your normal daily activities. This is usually used to diagnose what is causing palpitations/syncope (passing out). - You will be mailed a monitor from [employer].  - They will call you in the next day or so to verify your address. Then is will take 5-7 days to be mailed to you. - You will wear for 30 days and then place all the pieces of equipment that came with the device back in the provided box and take it to your nearest UPS drop off locations. - Call [employer] at [phone], if you have any questions concerning the monitor once you have received it. - DO NOT GET THE MONITOR WET.     Follow-Up: Your physician recommends that you schedule a follow-up appointment in: 6-8 WEEKS WITH DR [place]. (Prob between 10-3 to second week of October)  If you need a refill on your  cardiac medications before your next appointment, please call your pharmacy.   Ankle-Brachial Index Test The ankle-brachial index (ABI) test is used to find peripheral vascular disease (PVD). PVD is also known as peripheral arterial disease (PAD). PVD is the blocking or hardening of the arteries anywhere within the circulatory system beyond the heart. PVD is caused by cholesterol deposits in your blood vessels (atherosclerosis). These deposits cause arteries to narrow. The delivery of oxygen to your tissues is impaired as a result. This can cause muscle pain and fatigue. This is called claudication. PVD means there may also be buildup of cholesterol in your:  Heart. This increases the risk of heart attacks.  Brain. This increases the risk of strokes.  The ankle-brachial index test measures the blood flow in your arms and legs. This test also determines if blood vessels in your leg are narrowed by cholesterol deposits. There are additional causes of a reduced ankle-brachial index, such as inflammation of vessels or a clot in the vessels. However, these are much less common than narrowing due to cholesterol deposits. What is being tested? The test is done while you are lying down and resting. Measurements are taken of the systolic pressure:  In your arm (brachial).  In your ankle at several points along your leg.  Systolic pressure is the pressure inside your arteries when your heart pumps. The measurements are taken several times on both sides. Then, the highest systolic pressure of the ankle is divided by the highest brachial systolic pressure. The result is the ankle-brachial pressure ratio, or  ABI. Sometimes this test is repeated after you have exercised on a treadmill for five minutes. You may have leg pain during the exercise portion of the test if you suffer from PAD. If the index number drops after exercise, this may show that PAD is present. A normal ABI ratio is between 0.9 and 1.4. A  value below 0.9 is considered abnormal. This information is not intended to replace advice given to you by your health care provider. Make sure you discuss any questions you have with your health care provider. Document Released: 11/09/2004 Document Revised: 04/12/2016 Document Reviewed: 06/11/2014 Elsevier Interactive Patient Education  [employer].

## 2018-07-08 NOTE — Progress Notes (Signed)
Daily Session Note  Patient Details  Name: Kelsey Terry MRN: [id number] Date of Birth: 1961/08/22 Referring Provider:     Cardiac Rehab from 06/09/2018 in [facility] Cardiac and Pulmonary Rehab  Referring Provider  Kathlyn Sacramento MD      Encounter Date: 07/08/2018  Check In: Session Check In - 07/08/18 0925      Check-In   Supervising physician immediately available to respond to emergencies  See telemetry face sheet for immediately available ER MD    Location  [facility]-Cardiac & Pulmonary Rehab    Staff Present  Joellyn Rued, BS, PEC;Krista Rhinecliff, RN BSN;Jessica [place], [place], RCEP, CCRP, Exercise Physiologist    Medication changes reported      No    Fall or balance concerns reported     No    Warm-up and Cool-down  Performed on first and last piece of equipment    Resistance Training Performed  Yes    VAD Patient?  No    PAD/SET Patient?  No      Pain Assessment   Currently in Pain?  No/denies    Pain Score  0-No pain    Multiple Pain Sites  No          Social History   Tobacco Use  Smoking Status Never Smoker  Smokeless Tobacco Never Used    Goals Met:  Independence with exercise equipment Exercise tolerated well No report of cardiac concerns or symptoms Strength training completed today  Goals Unmet:  Not Applicable  Comments: Pt able to follow exercise prescription today without complaint.  Will continue to monitor for progression.    Dr. Emily Filbert is Medical Director for [place] and [facility].

## 2018-07-08 NOTE — Telephone Encounter (Signed)
Received records request FMLA([employer]) for pt daughter,, forwarded to [facility] for processing

## 2018-07-09 ENCOUNTER — Other Ambulatory Visit: Payer: Self-pay | Admitting: *Deleted

## 2018-07-09 DIAGNOSIS — E785 Hyperlipidemia, unspecified: Secondary | ICD-10-CM

## 2018-07-09 DIAGNOSIS — Z79899 Other long term (current) drug therapy: Secondary | ICD-10-CM

## 2018-07-09 LAB — CBC WITH DIFFERENTIAL/PLATELET
BASOS: 1 %
Basophils Absolute: 0 10*3/uL (ref 0.0–0.2)
EOS (ABSOLUTE): 0.1 10*3/uL (ref 0.0–0.4)
EOS: 2 %
HEMATOCRIT: 37.9 % (ref 34.0–46.6)
HEMOGLOBIN: 12.5 g/dL (ref 11.1–15.9)
IMMATURE GRANS (ABS): 0 10*3/uL (ref 0.0–0.1)
IMMATURE GRANULOCYTES: 0 %
Lymphocytes Absolute: 2.3 10*3/uL (ref 0.7–3.1)
Lymphs: 42 %
MCH: 26.9 pg (ref 26.6–33.0)
MCHC: 33 g/dL (ref 31.5–35.7)
MCV: 82 fL (ref 79–97)
MONOCYTES: 8 %
MONOS ABS: 0.4 10*3/uL (ref 0.1–0.9)
NEUTROS PCT: 47 %
Neutrophils Absolute: 2.5 10*3/uL (ref 1.4–7.0)
Platelets: 295 10*3/uL (ref 150–450)
RBC: 4.64 x10E6/uL (ref 3.77–5.28)
RDW: 15.4 % (ref 12.3–15.4)
WBC: 5.4 10*3/uL (ref 3.4–10.8)

## 2018-07-09 LAB — HEPATIC FUNCTION PANEL
ALBUMIN: 4.3 g/dL (ref 3.5–5.5)
ALK PHOS: 71 IU/L (ref 39–117)
ALT: 17 IU/L (ref 0–32)
AST: 21 IU/L (ref 0–40)
BILIRUBIN, DIRECT: 0.15 mg/dL (ref 0.00–0.40)
Bilirubin Total: 0.5 mg/dL (ref 0.0–1.2)
Total Protein: 7.9 g/dL (ref 6.0–8.5)

## 2018-07-09 LAB — LIPID PANEL
CHOL/HDL RATIO: 3.3 ratio (ref 0.0–4.4)
Cholesterol, Total: 162 mg/dL (ref 100–199)
HDL: 49 mg/dL (ref >39)
LDL Calculated: 86 mg/dL (ref 0–99)
Triglycerides: 135 mg/dL (ref 0–149)
VLDL Cholesterol Cal: 27 mg/dL (ref 5–40)

## 2018-07-09 MED ORDER — ROSUVASTATIN CALCIUM 40 MG PO TABS: 40.0000 mg | ORAL_TABLET | Freq: Every day | ORAL | 3 refills | Status: DC

## 2018-07-10 ENCOUNTER — Encounter: Payer: Self-pay | Admitting: *Deleted

## 2018-07-10 DIAGNOSIS — I214 Non-ST elevation (NSTEMI) myocardial infarction: Secondary | ICD-10-CM

## 2018-07-10 DIAGNOSIS — Z955 Presence of coronary angioplasty implant and graft: Secondary | ICD-10-CM

## 2018-07-10 NOTE — Progress Notes (Signed)
Daily Session Note  Patient Details  Name: Kelsey Terry MRN: [id number] Date of Birth: [birth year] Referring Provider:     Cardiac Rehab from 06/09/2018 in [facility] Cardiac and Pulmonary Rehab  Referring Provider  Kathlyn Sacramento MD      Encounter Date: 07/10/2018  Check In: Session Check In - 07/10/18 0925      Check-In   Supervising physician immediately available to respond to emergencies  See telemetry face sheet for immediately available ER MD    Location  [facility]-Cardiac & Pulmonary Rehab    Staff Present  Joellyn Rued, BS, PEC;Jessica [place], [place], RCEP, [place], Exercise Physiologist;Joseph Hood RCP,RRT,BSRT;Carroll Enterkin, Therapist, sports, BSN    Medication changes reported      No    Fall or balance concerns reported     No    Warm-up and Cool-down  Performed on first and last piece of equipment    Resistance Training Performed  Yes    VAD Patient?  No    PAD/SET Patient?  No      Pain Assessment   Currently in Pain?  No/denies    Pain Score  0-No pain    Multiple Pain Sites  No          Social History   Tobacco Use  Smoking Status Never Smoker  Smokeless Tobacco Never Used    Goals Met:  Independence with exercise equipment Exercise tolerated well No report of cardiac concerns or symptoms Strength training completed today  Goals Unmet:  Not Applicable  Comments: Pt able to follow exercise prescription today without complaint.  Will continue to monitor for progression.    Dr. Emily Filbert is Medical Director for [place] and [facility].

## 2018-07-11 ENCOUNTER — Ambulatory Visit (INDEPENDENT_AMBULATORY_CARE_PROVIDER_SITE_OTHER): Payer: Self-pay

## 2018-07-11 DIAGNOSIS — R55 Syncope and collapse: Secondary | ICD-10-CM

## 2018-07-15 ENCOUNTER — Encounter: Payer: Self-pay | Admitting: *Deleted

## 2018-07-15 DIAGNOSIS — Z955 Presence of coronary angioplasty implant and graft: Secondary | ICD-10-CM

## 2018-07-15 DIAGNOSIS — I214 Non-ST elevation (NSTEMI) myocardial infarction: Secondary | ICD-10-CM

## 2018-07-15 NOTE — Progress Notes (Signed)
Daily Session Note  Patient Details  Name: Kelsey Terry MRN: [id number] Date of Birth: February 26, 1961 Referring Provider:     Cardiac Rehab from 06/09/2018 in [facility] Cardiac and Pulmonary Rehab  Referring Provider  Kathlyn Sacramento MD      Encounter Date: 07/15/2018  Check In: Session Check In - 07/15/18 0929      Check-In   Supervising physician immediately available to respond to emergencies  See telemetry face sheet for immediately available ER MD    Location  [facility]-Cardiac & Pulmonary Rehab    Staff Present  Joellyn Rued, BS, PEC;Susanne Bice, RN, BSN, CCRP;Broox Lonigro [place], MA, RCEP, CCRP, Exercise Physiologist;Amanda Oletta Darter, BA, ACSM CEP, Exercise Physiologist    Medication changes reported      No    Fall or balance concerns reported     No    Warm-up and Cool-down  Performed on first and last piece of equipment    Resistance Training Performed  Yes    VAD Patient?  No    PAD/SET Patient?  No      Pain Assessment   Currently in Pain?  No/denies          Social History   Tobacco Use  Smoking Status Never Smoker  Smokeless Tobacco Never Used    Goals Met:  Independence with exercise equipment Exercise tolerated well No report of cardiac concerns or symptoms Strength training completed today  Goals Unmet:  Not Applicable  Comments: Pt able to follow exercise prescription today without complaint.  Will continue to monitor for progression.    Dr. Emily Filbert is Medical Director for [place] and [facility].

## 2018-07-17 ENCOUNTER — Telehealth: Payer: Self-pay | Admitting: Cardiovascular Disease

## 2018-07-17 ENCOUNTER — Other Ambulatory Visit: Payer: Self-pay

## 2018-07-17 ENCOUNTER — Encounter: Payer: Self-pay | Admitting: *Deleted

## 2018-07-17 DIAGNOSIS — Z955 Presence of coronary angioplasty implant and graft: Secondary | ICD-10-CM

## 2018-07-17 DIAGNOSIS — I214 Non-ST elevation (NSTEMI) myocardial infarction: Secondary | ICD-10-CM

## 2018-07-17 MED ORDER — ASPIRIN 81 MG PO TBEC: 81.0000 mg | DELAYED_RELEASE_TABLET | Freq: Every day | ORAL | 1 refills | Status: DC

## 2018-07-17 MED ORDER — ASPIRIN 81 MG PO TBEC: 81.0000 mg | DELAYED_RELEASE_TABLET | Freq: Every day | ORAL | 0 refills | Status: AC

## 2018-07-17 NOTE — Progress Notes (Signed)
Daily Session Note  Patient Details  Name: Kelsey Terry MRN: [id number] Date of Birth: Jan 11, 1961 Referring Provider:     Cardiac Rehab from 06/09/2018 in [facility] Cardiac and Pulmonary Rehab  Referring Provider  Kathlyn Sacramento MD      Encounter Date: 07/17/2018  Check In: Session Check In - 07/17/18 1543      Check-In   Supervising physician immediately available to respond to emergencies  See telemetry face sheet for immediately available ER MD    Location  [facility]-Cardiac & Pulmonary Rehab    Staff Present  Gerlene Burdock, RN, BSN;Harue Pribble Luan [place], MA, RCEP, CCRP, Exercise Physiologist;Joseph Hood RCP,RRT,BSRT;Mandi Sebastian, BS, [facility]    Medication changes reported      No    Fall or balance concerns reported     No    Warm-up and Cool-down  Performed on first and last piece of equipment    Resistance Training Performed  Yes    VAD Patient?  No    PAD/SET Patient?  No      Pain Assessment   Currently in Pain?  No/denies          Social History   Tobacco Use  Smoking Status Never Smoker  Smokeless Tobacco Never Used    Goals Met:  Independence with exercise equipment Exercise tolerated well No report of cardiac concerns or symptoms Strength training completed today  Goals Unmet:  Not Applicable  Comments: Pt able to follow exercise prescription today without complaint.  Will continue to monitor for progression.    Dr. Emily Filbert is Medical Director for [place] and [facility].

## 2018-07-17 NOTE — Telephone Encounter (Signed)
°*  STAT* If patient is at the pharmacy, call can be transferred to refill team.   1. Which medications need to be refilled? (please list name of each medication and dose if known) Aspirin   2. Which pharmacy/location (including street and city if local pharmacy) is medication to be sent to? Medication management   3. Do they need a 30 day or 90 day supply? 90 day

## 2018-07-17 NOTE — Telephone Encounter (Signed)
Patient dropped off signed release form and money order Placed in interoffice mail and sent to [facility]

## 2018-07-17 NOTE — Telephone Encounter (Signed)
aspirin 81 MG EC tablet 90 tablet 0 07/17/2018 10/15/2018   Sig - Route: Take 1 tablet (81 mg total) by mouth daily. - Oral   Sent to pharmacy as: aspirin 81 MG EC tablet   E-Prescribing Status: Sent to pharmacy (07/17/2018 11:42 AM EDT)   Pharmacy   MEDICATION [facility] [place], [place] [phone]

## 2018-07-22 ENCOUNTER — Encounter: Payer: Self-pay | Attending: Cardiovascular Disease | Admitting: *Deleted

## 2018-07-22 DIAGNOSIS — I890 Lymphedema, not elsewhere classified: Secondary | ICD-10-CM | POA: Insufficient documentation

## 2018-07-22 DIAGNOSIS — I10 Essential (primary) hypertension: Secondary | ICD-10-CM | POA: Insufficient documentation

## 2018-07-22 DIAGNOSIS — Z955 Presence of coronary angioplasty implant and graft: Secondary | ICD-10-CM | POA: Insufficient documentation

## 2018-07-22 DIAGNOSIS — Z79899 Other long term (current) drug therapy: Secondary | ICD-10-CM | POA: Insufficient documentation

## 2018-07-22 DIAGNOSIS — I2510 Atherosclerotic heart disease of native coronary artery without angina pectoris: Secondary | ICD-10-CM | POA: Insufficient documentation

## 2018-07-22 DIAGNOSIS — Z7982 Long term (current) use of aspirin: Secondary | ICD-10-CM | POA: Insufficient documentation

## 2018-07-22 DIAGNOSIS — I214 Non-ST elevation (NSTEMI) myocardial infarction: Secondary | ICD-10-CM | POA: Insufficient documentation

## 2018-07-22 NOTE — Progress Notes (Signed)
Daily Session Note  Patient Details  Name: Kelsey Terry MRN: [id number] Date of Birth: [birth year] Referring Provider:     Cardiac Rehab from 06/09/2018 in [facility] Cardiac and Pulmonary Rehab  Referring Provider  Kathlyn Sacramento MD      Encounter Date: 07/22/2018  Check In: Session Check In - 07/22/18 0949      Check-In   Supervising physician immediately available to respond to emergencies  See telemetry face sheet for immediately available ER MD    Location  [facility]-Cardiac & Pulmonary Rehab    Staff Present  Nyoka Cowden, RN, BSN, [place], MA, RCEP, CCRP, Exercise Physiologist;Amanda Oletta Darter, [place], ACSM CEP, Exercise Physiologist    Medication changes reported      No    Fall or balance concerns reported     No    Warm-up and Cool-down  Performed on first and last piece of equipment    Resistance Training Performed  Yes    VAD Patient?  No    PAD/SET Patient?  No      Pain Assessment   Currently in Pain?  No/denies          Social History   Tobacco Use  Smoking Status Never Smoker  Smokeless Tobacco Never Used    Goals Met:  Independence with exercise equipment Exercise tolerated well No report of cardiac concerns or symptoms Strength training completed today  Goals Unmet:  Not Applicable  Comments: Pt able to follow exercise prescription today without complaint.  Will continue to monitor for progression.    Dr. Emily Filbert is Medical Director for [place] and [facility].

## 2018-07-28 NOTE — Telephone Encounter (Signed)
Received paperwork from [employer] for physician to review and sign, placed in nurses box

## 2018-07-29 ENCOUNTER — Encounter: Payer: Self-pay | Admitting: *Deleted

## 2018-07-29 DIAGNOSIS — I214 Non-ST elevation (NSTEMI) myocardial infarction: Secondary | ICD-10-CM

## 2018-07-29 DIAGNOSIS — Z955 Presence of coronary angioplasty implant and graft: Secondary | ICD-10-CM

## 2018-07-29 NOTE — Progress Notes (Signed)
Daily Session Note  Patient Details  Name: Kelsey Terry MRN: [id number] Date of Birth: 1961/09/15 Referring Provider:     Cardiac Rehab from 06/09/2018 in [facility] Cardiac and Pulmonary Rehab  Referring Provider  Kathlyn Sacramento MD      Encounter Date: 07/29/2018  Check In: Session Check In - 07/29/18 0945      Check-In   Supervising physician immediately available to respond to emergencies  See telemetry face sheet for immediately available ER MD    Location  [facility]-Cardiac & Pulmonary Rehab    Staff Present  Nyoka Cowden, RN, BSN, [place], MA, RCEP, CCRP, Exercise Physiologist;Amanda Oletta Darter, [place], ACSM CEP, Exercise Physiologist    Medication changes reported      No    Fall or balance concerns reported     No    Warm-up and Cool-down  Performed on first and last piece of equipment    Resistance Training Performed  Yes    VAD Patient?  No    PAD/SET Patient?  No      Pain Assessment   Currently in Pain?  No/denies          Social History   Tobacco Use  Smoking Status Never Smoker  Smokeless Tobacco Never Used    Goals Met:  Independence with exercise equipment Exercise tolerated well No report of cardiac concerns or symptoms Strength training completed today  Goals Unmet:  Not Applicable  Comments: Pt able to follow exercise prescription today without complaint.  Will continue to monitor for progression.    Dr. Emily Filbert is Medical Director for [place] and [facility].

## 2018-07-30 ENCOUNTER — Encounter: Payer: Self-pay | Admitting: *Deleted

## 2018-07-30 DIAGNOSIS — Z955 Presence of coronary angioplasty implant and graft: Secondary | ICD-10-CM

## 2018-07-30 DIAGNOSIS — I214 Non-ST elevation (NSTEMI) myocardial infarction: Secondary | ICD-10-CM

## 2018-07-30 NOTE — Progress Notes (Signed)
Cardiac Individual Treatment Plan  Patient Details  Name: Kelsey Terry MRN: [id number] Date of Birth: 09-26-1961 Referring Provider:     Cardiac Rehab from 06/09/2018 in [facility] Cardiac and Pulmonary Rehab  Referring Provider  Kathlyn Sacramento MD      Initial Encounter Date:    Cardiac Rehab from 06/09/2018 in [facility] Cardiac and Pulmonary Rehab  Date  06/09/18      Visit Diagnosis: Status post coronary artery stent placement  NSTEMI (non-ST elevated myocardial infarction) [facility])  Patient's Home Medications on Admission:  Current Outpatient Medications:  .  aspirin 81 MG EC tablet, Take 1 tablet (81 mg total) by mouth daily., Disp: 90 tablet, Rfl: 0 .  losartan-hydrochlorothiazide (HYZAAR) 100-12.5 MG tablet, Take 1 tablet by mouth daily., Disp: 90 tablet, Rfl: 3 .  potassium chloride (K-DUR) 10 MEQ tablet, Take 20 mEq by mouth daily., Disp: , Rfl:  .  rosuvastatin (CRESTOR) 40 MG tablet, Take 1 tablet (40 mg total) by mouth daily., Disp: 90 tablet, Rfl: 3  Past Medical History: Past Medical History:  Diagnosis Date  . Asthma   . CAD in native artery    a. NSTEMI 05/16/18: [place] 05/16/18 - ost-pLAD 95% s/p PCI/DES, mLAD 30%, ostD1 70%, ost-pLCx 30%, mRCA 90%, dRCA 20%; b. staged PCI/DES to [facility] 7/1  . Diastolic dysfunction    a. TTE 05/16/18: EF 55-60%, no RWMA, Gr1DD, trivial AI, calcified mitral annulus, mild MR, mildly dilated LA  . Hypertension   . Lymphedema    LLE  . Pericarditis    a. ~ 2012 in [place], [place] s/p pericardiocentesis     Tobacco Use: Social History   Tobacco Use  Smoking Status Never Smoker  Smokeless Tobacco Never Used    Labs: Recent Review Scientist, physiological    Labs for ITP Cardiac and Pulmonary Rehab Latest Ref Rng & Units 05/16/2018 07/08/2018   Cholestrol 100 - 199 mg/dL 232(H) 162   LDLCALC 0 - 99 mg/dL 168(H) 86   HDL >39 mg/dL 55 49   Trlycerides 0 - 149 mg/dL 43 135   Hemoglobin A1c 4.8 - 5.6 % 5.7(H) -       Exercise Target Goals: Exercise  Program Goal: Individual exercise prescription set using results from initial 6 min walk test and THRR while considering  patient's activity barriers and safety.   Exercise Prescription Goal: Initial exercise prescription builds to 30-45 minutes a day of aerobic activity, 2-3 days per week.  Home exercise guidelines will be given to patient during program as part of exercise prescription that the participant will acknowledge.  Activity Barriers & Risk Stratification: Activity Barriers & Cardiac Risk Stratification - 06/09/18 1246      Activity Barriers & Cardiac Risk Stratification   Activity Barriers  Deconditioning;Muscular Weakness;Balance Concerns;Other (comment)    Comments  Lymphodema in bilater legs, wears support stockings    Cardiac Risk Stratification  High       6 Minute Walk: 6 Minute Walk    Row Name 06/09/18 1245         6 Minute Walk   Phase  Initial     Distance  1070 feet     Walk Time  6 minutes     # of Rest Breaks  0     MPH  2.03     METS  3.15     RPE  11     VO2 Peak  11     Symptoms  No     Resting HR  57 bpm     Resting BP  132/58     Resting Oxygen Saturation   98 %     Exercise Oxygen Saturation  during 6 min walk  100 %     Max Ex. HR  112 bpm     Max Ex. BP  172/74     2 Minute Post BP  124/72        Oxygen Initial Assessment:   Oxygen Re-Evaluation:   Oxygen Discharge (Final Oxygen Re-Evaluation):   Initial Exercise Prescription: Initial Exercise Prescription - 06/09/18 1200      Date of Initial Exercise RX and Referring Provider   Date  06/09/18    Referring Provider  Kathlyn Sacramento MD      Treadmill   MPH  2    Grade  0.5    Minutes  15    METs  2.69      NuStep   Level  2    SPM  80    Minutes  15    METs  2      Recumbant Elliptical   Level  1    RPM  50    Minutes  15    METs  2      Prescription Details   Frequency (times per week)  2    Duration  Progress to 30 minutes of continuous aerobic without  signs/symptoms of physical distress      Intensity   THRR 40-80% of Max Heartrate  99-141    Ratings of Perceived Exertion  11-13    Perceived Dyspnea  0-4      Progression   Progression  Continue to progress workloads to maintain intensity without signs/symptoms of physical distress.      Resistance Training   Training Prescription  Yes    Weight  3 lbs    Reps  10-15       Perform Capillary Blood Glucose checks as needed.  Exercise Prescription Changes: Exercise Prescription Changes    Row Name 06/09/18 1200 06/26/18 1100 07/01/18 1000 07/10/18 1100 07/22/18 1600     Response to Exercise   Blood Pressure (Admit)  132/58  120/68  -  120/72  142/96   Blood Pressure (Exercise)  172/74  134/70  -  140/68  146/84   Blood Pressure (Exit)  124/72  124/66  -  126/72  136/64   Heart Rate (Admit)  57 bpm  70 bpm  -  66 bpm  91 bpm   Heart Rate (Exercise)  112 bpm  103 bpm  -  109 bpm  120 bpm   Heart Rate (Exit)  59 bpm  63 bpm  -  65 bpm  75 bpm   Oxygen Saturation (Admit)  98 %  -  -  -  -   Oxygen Saturation (Exercise)  100 %  -  -  -  -   Rating of Perceived Exertion (Exercise)  11  13  -  13  13   Symptoms  none  none  -  none  none   Comments  walk test results  first full week of exercise   -  increased incline on the treadmill  -   Duration  -  Continue with 30 min of aerobic exercise without signs/symptoms of physical distress.  -  Continue with 30 min of aerobic exercise without signs/symptoms of physical distress.  Continue with 30 min of aerobic exercise without signs/symptoms of physical distress.  Intensity  -  THRR unchanged  -  THRR unchanged  THRR unchanged     Progression   Progression  -  Continue to progress workloads to maintain intensity without signs/symptoms of physical distress.  -  Continue to progress workloads to maintain intensity without signs/symptoms of physical distress.  Continue to progress workloads to maintain intensity without signs/symptoms of  physical distress.   Average METs  -  2.28  -  2.28  2.66     Resistance Training   Training Prescription  -  Yes  -  Yes  Yes   Weight  -  3 lbs  -  3 lbs  3 lbs   Reps  -  10-15  -  10-15  10-15     Interval Training   Interval Training  -  No  -  -  No     Treadmill   MPH  -  2  -  2  2.5   Grade  -  0.5  -  1.5  0.5   Minutes  -  15  -  15  15   METs  -  2.67  -  2.67  3.09     NuStep   Level  -  3  -  3  3   Minutes  -  15  -  15  15   METs  -  1.9  -  1.9  3.4     Recumbant Elliptical   Level  -  -  -  2  2   Minutes  -  -  -  15  15   METs  -  -  -  2  1.5     Home Exercise Plan   Plans to continue exercise at  -  -  [employer] (comment) apartment gym   [employer] (comment)  [employer]nsic scientist (comment)   Frequency  -  -  Add 3 additional days to program exercise sessions.  Add 3 additional days to program exercise sessions.  Add 3 additional days to program exercise sessions.   Initial Home Exercises Provided  -  -  07/01/18  07/01/18  07/01/18      Exercise Comments: Exercise Comments    Row Name 06/19/18 [phone]           Exercise Comments  First full day of exercise!  Patient was oriented to gym and equipment including functions, settings, policies, and procedures.  Patient's individual exercise prescription and treatment plan were reviewed.  All starting workloads were established based on the results of the 6 minute walk test done at initial orientation visit.  The plan for exercise progression was also introduced and progression will be customized based on patient's performance and goals.          Exercise Goals and Review: Exercise Goals    Row Name 06/09/18 1355             Exercise Goals   Increase Physical Activity  Yes       Intervention  Provide advice, education, support and counseling about physical activity/exercise needs.;Develop an individualized exercise prescription for aerobic and resistive training based on initial  evaluation findings, risk stratification, comorbidities and participant's personal goals.       Expected Outcomes  Short Term: Attend rehab on a regular basis to increase amount of physical activity.;Long Term: Add in home exercise to make exercise part of routine and to increase amount of physical  activity.;Long Term: Exercising regularly at least 3-5 days a week.       Increase Strength and Stamina  Yes       Intervention  Provide advice, education, support and counseling about physical activity/exercise needs.;Develop an individualized exercise prescription for aerobic and resistive training based on initial evaluation findings, risk stratification, comorbidities and participant's personal goals.       Expected Outcomes  Short Term: Increase workloads from initial exercise prescription for resistance, speed, and METs.;Short Term: Perform resistance training exercises routinely during rehab and add in resistance training at home;Long Term: Improve cardiorespiratory fitness, muscular endurance and strength as measured by increased METs and functional capacity (6MWT)       Able to understand and use rate of perceived exertion (RPE) scale  Yes       Intervention  Provide education and explanation on how to use RPE scale       Expected Outcomes  Short Term: Able to use RPE daily in rehab to express subjective intensity level;Long Term:  Able to use RPE to guide intensity level when exercising independently       Able to understand and use Dyspnea scale  Yes       Intervention  Provide education and explanation on how to use Dyspnea scale       Expected Outcomes  Short Term: Able to use Dyspnea scale daily in rehab to express subjective sense of shortness of breath during exertion;Long Term: Able to use Dyspnea scale to guide intensity level when exercising independently       Knowledge and understanding of Target Heart Rate Range (THRR)  Yes       Intervention  Provide education and explanation of THRR  including how the numbers were predicted and where they are located for reference       Expected Outcomes  Short Term: Able to state/look up THRR;Short Term: Able to use daily as guideline for intensity in rehab;Long Term: Able to use THRR to govern intensity when exercising independently       Able to check pulse independently  Yes       Intervention  Provide education and demonstration on how to check pulse in carotid and radial arteries.;Review the importance of being able to check your own pulse for safety during independent exercise       Expected Outcomes  Short Term: Able to explain why pulse checking is important during independent exercise;Long Term: Able to check pulse independently and accurately       Understanding of Exercise Prescription  Yes       Intervention  Provide education, explanation, and written materials on patient's individual exercise prescription       Expected Outcomes  Short Term: Able to explain program exercise prescription;Long Term: Able to explain home exercise prescription to exercise independently          Exercise Goals Re-Evaluation : Exercise Goals Re-Evaluation    Row Name 06/19/18 1601 06/26/18 1127 07/01/18 1039 07/08/18 1025 07/10/18 1128     Exercise Goal Re-Evaluation   Exercise Goals Review  Understanding of Exercise Prescription;Knowledge and understanding of Target Heart Rate Range (THRR);Able to understand and use rate of perceived exertion (RPE) scale  Understanding of Exercise Prescription;Knowledge and understanding of Target Heart Rate Range (THRR);Able to understand and use rate of perceived exertion (RPE) scale  Understanding of Exercise Prescription;Knowledge and understanding of Target Heart Rate Range (THRR);Able to understand and use rate of perceived exertion (RPE) scale  Increase Physical Activity;Increase  Strength and Stamina;Understanding of Exercise Prescription  Increase Physical Activity;Increase Strength and Stamina;Understanding of  Exercise Prescription   Comments  Reviewed RPE scale, THR and program prescription with pt today.  Pt voiced understanding and was given a copy of goals to take home.   Daniyla has been doing well in rehab. She is walking on the treadmill as well as using the REL on level 3. She has done well during her first full week of exercise and seems to be gaining strength with the weights.  Reviewed home exercise with pt today.  Pt plans to work out at her daughters apartment gym for exercise.  Reviewed THR, pulse, RPE, sign and symptoms, NTG use, and when to call 911 or MD.  Also discussed weather considerations and indoor options.  Pt voiced understanding.  Elfrida is doing well in rehab.  She is using the treadmill and weights and recumbent bike in her daughter's gym at home.  She is starting to get her strength and stamina back.    Jacque is doing well in rehab.  She is using the treadmill and weights and recumbent bike in her daughter's gym at home.  She is starting to get her strength and stamina back.     Expected Outcomes  Short: Use RPE daily to regulate intensity. Long: Follow program prescription in THR.  Short: continue to attend rehab regularly. Long: Increase overall MET level.   Short: add an additional 3 days of exercise at home. Long: increase overall activity levels   Short: Continue to add in home exercise regularly.  Long: Continue to build strength and stamina.   Short: Continue to add in home exercise regularly.  Long: Continue to build strength and stamina.    [place] Name 07/22/18 1654             Exercise Goal Re-Evaluation   Exercise Goals Review  Increase Physical Activity;Increase Strength and Stamina;Understanding of Exercise Prescription       Comments  Alette continues to do well in rehab.  She up to 2.5 mph on the treadmill.  We will continue to work on her progress.        Expected Outcomes  Short: Increase workload on XR and NuStep.  Long: Continue to exercise more independently.             Discharge Exercise Prescription (Final Exercise Prescription Changes): Exercise Prescription Changes - 07/22/18 1600      Response to Exercise   Blood Pressure (Admit)  142/96    Blood Pressure (Exercise)  146/84    Blood Pressure (Exit)  136/64    Heart Rate (Admit)  91 bpm    Heart Rate (Exercise)  120 bpm    Heart Rate (Exit)  75 bpm    Rating of Perceived Exertion (Exercise)  13    Symptoms  none    Duration  Continue with 30 min of aerobic exercise without signs/symptoms of physical distress.    Intensity  THRR unchanged      Progression   Progression  Continue to progress workloads to maintain intensity without signs/symptoms of physical distress.    Average METs  2.66      Resistance Training   Training Prescription  Yes    Weight  3 lbs    Reps  10-15      Interval Training   Interval Training  No      Treadmill   MPH  2.5    Grade  0.5  Minutes  15    METs  3.09      NuStep   Level  3    Minutes  15    METs  3.4      Recumbant Elliptical   Level  2    Minutes  15    METs  1.5      Home Exercise Plan   Plans to continue exercise at  [facility] (comment)    Frequency  Add 3 additional days to program exercise sessions.    Initial Home Exercises Provided  07/01/18       Nutrition:  Target Goals: Understanding of nutrition guidelines, daily intake of sodium <1567m, cholesterol <2074m calories 30% from fat and 7% or less from saturated fats, daily to have 5 or more servings of fruits and vegetables.  Biometrics: Pre Biometrics - 06/09/18 1249      Pre Biometrics   Height  5' 3.6" (1.615 m)    Weight  202 lb 3.2 oz (91.7 kg)    Waist Circumference  35 inches    Hip Circumference  44 inches    Waist to Hip Ratio  0.8 %    BMI (Calculated)  35.16    Single Leg Stand  1.41 seconds        Nutrition Therapy Plan and Nutrition Goals: Nutrition Therapy & Goals - 07/01/18 1240      Nutrition Therapy   Diet  DASH    Protein  (specify units)  10oz    Fiber  25 grams    Whole Grain Foods  3 servings   does not always choose whole grains   Saturated Fats  13 max. grams    Fruits and Vegetables  5 servings/day   8 ideal, recently increased her f&v intake   Sodium  1500 grams      Personal Nutrition Goals   Nutrition Goal  When eating frozen meals, keep sodium below 60040mer meal and if you need to increase satiety/fullness you can add an additional serving of vegetables to the meal    Personal Goal #2  Practice being more aware of the sodium content in foods by reading nutrition facts labels. For you, this will be especially important for canned items, deli meats, and frozen meals.     Personal Goal #3  When eating out, look for ways to increase the nutritional value and decrease the sodium and fat content of the meal. For example, choosing steamed rather than fried entrees at a [employer] ordering vegetables / a plain baked potato as a side instead of mashed potatoes or french fries    Personal Goal #4  Prioritize protein at meal times; choose lean/ non fried varieties most often    Comments  She has been eating mostly fruits and vegetables recently d/t being confused about what foods to eat when following a heart-healthy eating plan. She has been eating frozen meals frequently but tries to choose Healthy Choice or Lean Cuisine brands. She does not consume sugar sweetened beverages often but eats a lot of canned items.      Intervention Plan   Intervention  Prescribe, educate and counsel regarding individualized specific dietary modifications aiming towards targeted core components such as weight, hypertension, lipid management, diabetes, heart failure and other comorbidities.;Nutrition handout(s) given to patient.   low sodium nutrition therapy, guidelines for heart healthy eating handout   Expected Outcomes  Short Term Goal: Understand basic principles of dietary content, such as calories, fat, sodium,  cholesterol and nutrients.;Short Term Goal: A plan has been developed with personal nutrition goals set during dietitian appointment.;Long Term Goal: Adherence to prescribed nutrition plan.       Nutrition Assessments: Nutrition Assessments - 06/09/18 1311      MEDFICTS Scores   Pre Score  36       Nutrition Goals Re-Evaluation: Nutrition Goals Re-Evaluation    [place] Name 07/01/18 1309 07/08/18 1030           Goals   Nutrition Goal  When eating frozen meals, keep sodium below 654m per meal and if you need to increase satiety/fullness you can add an additional serving of vegetables to the meal  lower sodium in meals, more protein, reading food labels      Comment  She has been eating frozen meals for lunch and dinner regularly as of late but is unsure of what constitutes as a "healty" choice when reading the label  BShabrekahas started to read her food labels more to cut back on sodium .   She is trying to get in more protein.  She has found ready labels to be shocking an discouraging.  It has been eye opening for her.,       Expected Outcome  She will choose meals with less than 6063msodium, equal to or greater than 15g protein, zero trans fat and minimal saturated fat  Short: Reading labels to reduce sodium and increase her protein. Long: Continue to aim for healthier options.         Personal Goal #2 Re-Evaluation   Personal Goal #2  Practice being more aware of the sodium content in foods by reading nutrition facts labels. For you, this will be especially important for canned items, deli meat and frozen meals  -        Personal Goal #3 Re-Evaluation   Personal Goal #3  When eating out, look for ways to increase the nutritional value and decrease the sodium and fat content of the meal. For example, choosing steamed rather than fried entrees at [employer] ordering vegetables/ a plain baked potato as a side instead of mashed potaotes or french fries  -        Personal Goal #4  Re-Evaluation   Personal Goal #4  Prioritize protein at meal times; choose lean / non fried varieties most often  -         Nutrition Goals Discharge (Final Nutrition Goals Re-Evaluation): Nutrition Goals Re-Evaluation - 07/08/18 1030      Goals   Nutrition Goal  lower sodium in meals, more protein, reading food labels    Comment  BrJorgiaas started to read her food labels more to cut back on sodium .   She is trying to get in more protein.  She has found ready labels to be shocking an discouraging.  It has been eye opening for her.,     Expected Outcome  Short: Reading labels to reduce sodium and increase her protein. Long: Continue to aim for healthier options.        Psychosocial: Target Goals: Acknowledge presence or absence of significant depression and/or stress, maximize coping skills, provide positive support system. Participant is able to verbalize types and ability to use techniques and skills needed for reducing stress and depression.   Initial Review & Psychosocial Screening: Initial Psych Review & Screening - 06/09/18 1311      Initial Review   Current issues with  Current Stress Concerns;Current Sleep Concerns  Alfredia is currently sleeping on her daughter's couch until she finds a place of her own   Source of Stress Concerns  Family;Financial;Poor Coping Skills    Comments  Iyania just moved here in May from [place]. She moved here with her son to be closer to her daughter. 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 She does not like to rely on her children to help her, but whenever she tries to drive her anxiety "acts up" and she ends up having her children drive her.       Family Dynamics   Good Support System?  Yes   family     Barriers   Psychosocial barriers to participate in program  There are no identifiable barriers or psychosocial needs.;The patient should benefit from training in stress management and relaxation.       Screening Interventions   Interventions  Encouraged to exercise;Provide feedback about the scores to participant;Program counselor consult;To provide support and resources with identified psychosocial needs    Expected Outcomes  Short Term goal: Utilizing psychosocial counselor, staff and physician to assist with identification of specific Stressors or current issues interfering with healing process. Setting desired goal for each stressor or current issue identified.;Long Term Goal: Stressors or current issues are controlled or eliminated.;Short Term goal: Identification and review with participant of any Quality of Life or Depression concerns found by scoring the questionnaire.;Long Term goal: The participant improves quality of Life and PHQ9 Scores as seen by post scores and/or verbalization of changes       Quality of Life Scores:  Quality of Life - 06/09/18 1058      Quality of Life   Select  Quality of Life      Quality of Life Scores   Health/Function Pre  19.61 %    Socioeconomic Pre  6.2 %    Psych/Spiritual Pre  20.57 %    Family Pre  16.13 %    GLOBAL Pre  17.3 %      Scores of 19 and below usually indicate a poorer quality of life in these areas.  A difference of  2-3 points is a clinically meaningful difference.  A difference of 2-3 points in the total score of the Quality of Life Index has been associated with significant improvement in overall quality of life, self-image, physical symptoms, and general health in studies assessing change in quality of life.  PHQ-9: Recent Review Flowsheet Data    Depression screen [facility] 2/9 06/09/2018   Decreased Interest 0   Down, Depressed, Hopeless 0   PHQ - 2 Score 0   Altered sleeping 0   Tired, decreased energy 1   Change in appetite 1   Feeling bad or failure about yourself  1   Trouble concentrating 1   Moving slowly or fidgety/restless 0   Suicidal thoughts 1    PHQ-9 Score 5   Difficult doing work/chores Somewhat difficult       Interpretation of Total Score  Total Score Depression Severity:  1-4 = Minimal depression, 5-9 = Mild depression, 10-14 = Moderate depression, 15-19 = Moderately severe depression, 20-27 = Severe depression   Psychosocial Evaluation and Intervention: Psychosocial Evaluation - 06/24/18 1047      Psychosocial Evaluation & Interventions   Interventions  Encouraged to exercise with the program and follow exercise prescription;Stress management education;Relaxation education    Comments  Counselor met with Ms. Harrington Challenger [name]) today for initial  psychosocial evaluation.  She is a 57 year old female who had a heart attack and (2) stents inserted in July.  Mushka has limited supports having just moved here from [place] and lives with her daughter and son, Rodman Key, who accompanied her to this meeting.  She reports sleeping "fair" and having a good appetite.  Monte denies a history of depression but admits to some symptoms of anxiety - particularly with the recent stress of the move and her health.  She also reported having some thoughts of suicide several weeks ago but no current thoughts or plans.  Counselor encouraged Verginia and her son to go to the ED if these thoughts return or worsen since she does not have a PCP yet in place.  Stress in Frimet's life are her health; the move; retirement; and particularly finances since she is too young to draw retirement or SS.  She also has conflict with her daughter whom she lives with.  Eniola was encouraged by this counselor to practice positive self-care by coming to exercise consistently and trying to find resources to be able to live outside her daughter's home in the near future.  Counselor gave [employer] on Stress management and learning to say "no" to others.  She agreed to set her health and her positive self-care as her goals and priorities at this time.  Counselor will follow with [name] - along with staff in this program.     Expected Outcomes  Short:  Marjan  will go to the ED if her symptoms of depression or anxiety worsen.  She will exercise consistently to improve her mood and her energy levels.  Long:  Gurtha will develop a positive self-care plan and utilize it consistently for her health and mental health.      Continue Psychosocial Services   Follow up required by counselor       Psychosocial Re-Evaluation: Psychosocial Re-Evaluation    [place] Name 07/08/18 1026             Psychosocial Re-Evaluation   Current issues with  Current Stress Concerns       Comments  Tanyia continues to do well in rehab.  She is still not comfortable at home.  She has not made progress towards moving out. She needs a job to be able to get out and take care of her needs.  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  I gave her vocational rehab paperwork .       Expected Outcomes  Short: Start vocational rehab.  Long: Continue to stay postive.           Psychosocial Discharge (Final Psychosocial Re-Evaluation): Psychosocial Re-Evaluation - 07/08/18 1026      Psychosocial Re-Evaluation   Current issues with  Current Stress Concerns    Comments  Aki continues to do well in rehab.  She is still not comfortable at home.  She has not made progress towards moving out. She needs a job to be able to get out and take care of her needs.  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  I gave her vocational rehab paperwork .    Expected Outcomes  Short: Start vocational rehab.  Long: Continue to stay postive.        Vocational Rehabilitation: Provide vocational rehab assistance to qualifying candidates.   Vocational Rehab Evaluation & Intervention: Vocational Rehab - 07/15/18 1112      Vocational Rehab Re-Evaulation   Comments  Annesha has orientation  for Vocational Rehab scheduled for Thursday 8/29       Education: Education Goals: Education classes will be provided on a variety of topics geared toward better understanding of heart health and risk factor  modification. Participant will state understanding/return demonstration of topics presented as noted by education test scores.  Learning Barriers/Preferences: Learning Barriers/Preferences - 06/09/18 1318      Learning Barriers/Preferences   Learning Barriers  None    Learning Preferences  Verbal Instruction       Education Topics:  AED/CPR: - Group verbal and written instruction with the use of models to demonstrate the basic use of the AED with the basic ABC's of resuscitation.   General Nutrition Guidelines/Fats and Fiber: -Group instruction provided by verbal, written material, models and posters to present the general guidelines for heart healthy nutrition. Gives an explanation and review of dietary fats and fiber.   Cardiac Rehab from 07/29/2018 in [facility] Cardiac and Pulmonary Rehab  Date  07/29/18  Educator  LB  Instruction Review Code  1- Verbalizes Understanding      Controlling Sodium/Reading Food Labels: -Group verbal and written material supporting the discussion of sodium use in heart healthy nutrition. Review and explanation with models, verbal and written materials for utilization of the food label.   Exercise Physiology & General Exercise Guidelines: - Group verbal and written instruction with models to review the exercise physiology of the cardiovascular system and associated critical values. Provides general exercise guidelines with specific guidelines to those with heart or lung disease.    Aerobic Exercise & Resistance Training: - Gives group verbal and written instruction on the various components of exercise. Focuses on aerobic and resistive training programs and the benefits of this training and how to safely progress through these programs..   Cardiac Rehab from 07/29/2018 in [facility] Cardiac and Pulmonary Rehab  Date  06/26/18  Educator  AS  Instruction Review Code  1- Verbalizes Understanding      Flexibility, Balance, Mind/Body Relaxation: Provides group  verbal/written instruction on the benefits of flexibility and balance training, including mind/body exercise modes such as yoga, pilates and tai chi.  Demonstration and skill practice provided.   Cardiac Rehab from 07/29/2018 in [facility] Cardiac and Pulmonary Rehab  Date  07/01/18  Educator  AS  Instruction Review Code  1- Verbalizes Understanding      Stress and Anxiety: - Provides group verbal and written instruction about the health risks of elevated stress and causes of high stress.  Discuss the correlation between heart/lung disease and anxiety and treatment options. Review healthy ways to manage with stress and anxiety.   Cardiac Rehab from 07/29/2018 in [facility] Cardiac and Pulmonary Rehab  Date  07/08/18  Educator  [facility]  Instruction Review Code  1- Verbalizes Understanding      Depression: - Provides group verbal and written instruction on the correlation between heart/lung disease and depressed mood, treatment options, and the stigmas associated with seeking treatment.   Cardiac Rehab from 07/29/2018 in [facility] Cardiac and Pulmonary Rehab  Date  06/24/18  Educator  [facility]  Instruction Review Code  1- Verbalizes Understanding      Anatomy & Physiology of the Heart: - Group verbal and written instruction and models provide basic cardiac anatomy and physiology, with the coronary electrical and arterial systems. Review of Valvular disease and Heart Failure   Cardiac Rehab from 07/29/2018 in [facility] Cardiac and Pulmonary Rehab  Date  07/10/18  Educator  CE  Instruction Review Code  1- [employer]  Understanding      Cardiac Procedures: - Group verbal and written instruction to review commonly prescribed medications for heart disease. Reviews the medication, class of the drug, and side effects. Includes the steps to properly store meds and maintain the prescription regimen. (beta blockers and nitrates)   Cardiac Medications I: - Group verbal and written instruction to review commonly prescribed  medications for heart disease. Reviews the medication, class of the drug, and side effects. Includes the steps to properly store meds and maintain the prescription regimen.   Cardiac Rehab from 07/29/2018 in [facility] Cardiac and Pulmonary Rehab  Date  07/15/18  Educator  SB  Instruction Review Code  1- Verbalizes Understanding      Cardiac Medications II: -Group verbal and written instruction to review commonly prescribed medications for heart disease. Reviews the medication, class of the drug, and side effects. (all other drug classes)   Cardiac Rehab from 07/29/2018 in [facility] Cardiac and Pulmonary Rehab  Date  07/03/18  Educator  CE  Instruction Review Code  1- Verbalizes Understanding       Go Sex-Intimacy & Heart Disease, Get SMART - Goal Setting: - Group verbal and written instruction through game format to discuss heart disease and the return to sexual intimacy. Provides group verbal and written material to discuss and apply goal setting through the application of the S.M.A.R.T. Method.   Other Matters of the Heart: - Provides group verbal, written materials and models to describe Stable Angina and Peripheral Artery. Includes description of the disease process and treatment options available to the cardiac patient.   Cardiac Rehab from 07/29/2018 in [facility] Cardiac and Pulmonary Rehab  Date  07/10/18  Educator  CE  Instruction Review Code  1- Verbalizes Understanding      Exercise & Equipment Safety: - Individual verbal instruction and demonstration of equipment use and safety with use of the equipment.   Cardiac Rehab from 07/29/2018 in [facility] Cardiac and Pulmonary Rehab  Date  06/09/18  Educator  [facility]  Instruction Review Code  1- Verbalizes Understanding      Infection Prevention: - Provides verbal and written material to individual with discussion of infection control including proper hand washing and proper equipment cleaning during exercise session.   Cardiac Rehab from 07/29/2018  in [facility] Cardiac and Pulmonary Rehab  Date  06/09/18  Educator  [facility]  Instruction Review Code  1- Verbalizes Understanding      Falls Prevention: - Provides verbal and written material to individual with discussion of falls prevention and safety.   Cardiac Rehab from 07/29/2018 in [facility] Cardiac and Pulmonary Rehab  Date  06/09/18  Educator  [facility]  Instruction Review Code  1- Verbalizes Understanding      Diabetes: - Individual verbal and written instruction to review signs/symptoms of diabetes, desired ranges of glucose level fasting, after meals and with exercise. Acknowledge that pre and post exercise glucose checks will be done for 3 sessions at entry of program.   Know Your Numbers and Risk Factors: -Group verbal and written instruction about important numbers in your health.  Discussion of what are risk factors and how they play a role in the disease process.  Review of Cholesterol, Blood Pressure, Diabetes, and BMI and the role they play in your overall health.   Cardiac Rehab from 07/29/2018 in [facility] Cardiac and Pulmonary Rehab  Date  07/03/18  Educator  CE  Instruction Review Code  1- Verbalizes Understanding      Sleep Hygiene: -Provides group verbal  and written instruction about how sleep can affect your health.  Define sleep hygiene, discuss sleep cycles and impact of sleep habits. Review good sleep hygiene tips.    Cardiac Rehab from 07/29/2018 in [facility] Cardiac and Pulmonary Rehab  Date  06/19/18  Educator  [facility]  Instruction Review Code  1- Verbalizes Understanding      Other: -Provides group and verbal instruction on various topics (see comments)   Knowledge Questionnaire Score: Knowledge Questionnaire Score - 06/09/18 1057      Knowledge Questionnaire Score   Pre Score  21/26   Test reviewed with pt today.  Education Focus: Nutrition, Exercise,  Anatomy, HTN, Heart Failure      Core Components/Risk Factors/Patient Goals at Admission: Personal Goals and Risk Factors  at Admission - 06/09/18 1306      Core Components/Risk Factors/Patient Goals on Admission    Weight Management  Yes;Weight Loss    Intervention  Weight Management: Develop a combined nutrition and exercise program designed to reach desired caloric intake, while maintaining appropriate intake of nutrient and fiber, sodium and fats, and appropriate energy expenditure required for the weight goal.;Weight Management: Provide education and appropriate resources to help participant work on and attain dietary goals.;Weight Management/Obesity: Establish reasonable short term and long term weight goals.    Admit Weight  202 lb (91.6 kg)    Goal Weight: Short Term  197 lb (89.4 kg)    Goal Weight: Long Term  190 lb (86.2 kg)    Expected Outcomes  Short Term: Continue to assess and modify interventions until short term weight is achieved;Long Term: Adherence to nutrition and physical activity/exercise program aimed toward attainment of established weight goal;Weight Loss: Understanding of general recommendations for a balanced deficit meal plan, which promotes 1-2 lb weight loss per week and includes a negative energy balance of [phone] kcal/d;Understanding recommendations for meals to include 15-35% energy as protein, 25-35% energy from fat, 35-60% energy from carbohydrates, less than 228m of dietary cholesterol, 20-35 gm of total fiber daily;Understanding of distribution of calorie intake throughout the day with the consumption of 4-5 meals/snacks    Hypertension  Yes    Intervention  Provide education on lifestyle modifcations including regular physical activity/exercise, weight management, moderate sodium restriction and increased consumption of fresh fruit, vegetables, and low fat dairy, alcohol moderation, and smoking cessation.;Monitor prescription use compliance.    Expected Outcomes  Short Term: Continued assessment and intervention until BP is < 140/961mHG in hypertensive participants. < 130/8064mG in  hypertensive participants with diabetes, heart failure or chronic kidney disease.;Long Term: Maintenance of blood pressure at goal levels.    Lipids  Yes    Intervention  Provide education and support for participant on nutrition & aerobic/resistive exercise along with prescribed medications to achieve LDL <53m62mDL >40mg48m Expected Outcomes  Short Term: Participant states understanding of desired cholesterol values and is compliant with medications prescribed. Participant is following exercise prescription and nutrition guidelines.;Long Term: Cholesterol controlled with medications as prescribed, with individualized exercise RX and with personalized nutrition plan. Value goals: LDL < 53mg,22m > 40 mg.       Core Components/Risk Factors/Patient Goals Review:  Goals and Risk Factor Review    Row Name 07/08/18 1032             Core Components/Risk Factors/Patient Goals Review   Personal Goals Review  Weight Management/Obesity;Hypertension;Lipids       Review  Angalena's weight has been holding steady.  She is  still craving sweets.  She is trying to stay away and eat fruits and trail mix instead. She is chewing gum to help.  We talked about trying dark chocolate to help.  Her blood pressures have getting better.  She checks them about every other day.  Overall she is doing well on her meds.  She see Christell Faith today.  She has stopped her carvediolol after a bad reaction.  She has been having her foot go numb at night.        Expected Outcomes  Short: Meet with PA today to talk about meds.  Long: Continue to work on weight loss.           Core Components/Risk Factors/Patient Goals at Discharge (Final Review):  Goals and Risk Factor Review - 07/08/18 1032      Core Components/Risk Factors/Patient Goals Review   Personal Goals Review  Weight Management/Obesity;Hypertension;Lipids    Review  Chereese's weight has been holding steady.  She is still craving sweets.  She is trying to stay away and  eat fruits and trail mix instead. She is chewing gum to help.  We talked about trying dark chocolate to help.  Her blood pressures have getting better.  She checks them about every other day.  Overall she is doing well on her meds.  She see Christell Faith today.  She has stopped her carvediolol after a bad reaction.  She has been having her foot go numb at night.     Expected Outcomes  Short: Meet with PA today to talk about meds.  Long: Continue to work on weight loss.        ITP Comments: ITP Comments    Row Name 06/09/18 1305 07/02/18 0851 07/30/18 0610       ITP Comments  Med Review completed. Initial ITP created. Diagnosis can be found in [facility] 6/27  30 day review completed. ITP sent to Dr. Ramonita Lab, covering for Dr. Emily Filbert, Medical Director of Cardiac Rehab. Continue with ITP unless changes are made by physician.  New to program  30 day review completed. ITP sent to Dr. Emily Filbert, Medical Director of Cardiac Rehab. Continue with ITP unless changes are made by physician        Comments:

## 2018-07-31 ENCOUNTER — Telehealth: Payer: Self-pay | Admitting: Physician Assistant

## 2018-07-31 ENCOUNTER — Encounter: Payer: Self-pay | Admitting: *Deleted

## 2018-07-31 DIAGNOSIS — I214 Non-ST elevation (NSTEMI) myocardial infarction: Secondary | ICD-10-CM

## 2018-07-31 DIAGNOSIS — Z955 Presence of coronary angioplasty implant and graft: Secondary | ICD-10-CM

## 2018-07-31 MED ORDER — TICAGRELOR 90 MG PO TABS: 90.0000 mg | ORAL_TABLET | Freq: Two times a day (BID) | ORAL | 0 refills | Status: DC

## 2018-07-31 NOTE — Telephone Encounter (Signed)
I called and spoke with the patient. She confirms she is waiting for Brilinta to come from the company- going to [facility]. She states she is currently on Brilinta and not plavix. She was told by [employer] to see if we could switch her to plavix while waiting for Brilinta. I advised the patient I will pull samples of Brilinta 90 mg tablets and put them at the front desk for her for pick up. She is agreeable.   Brilinta 90 mg samples given:  Lot: [id number]       Lot: [id number] Exp: 3/22           Exp: 10/21 # 3 boxes           # 1 box

## 2018-07-31 NOTE — Telephone Encounter (Signed)
Pt states Kelsey Terry is supposed to send her Brilinta to [facility]. Pt states she is requesting some Plavix to hold her over until she is able to get her Brilinta.

## 2018-07-31 NOTE — Progress Notes (Signed)
Daily Session Note  Patient Details  Name: Kelsey Terry MRN: [id number] Date of Birth: [birth year] Referring Provider:     Cardiac Rehab from 06/09/2018 in [facility] Cardiac and Pulmonary Rehab  Referring Provider  Kathlyn Sacramento MD      Encounter Date: 07/31/2018  Check In: Session Check In - 07/31/18 0952      Check-In   Supervising physician immediately available to respond to emergencies  See telemetry face sheet for immediately available ER MD    Location  [facility]-Cardiac & Pulmonary Rehab    Staff Present  Alberteen Sam, MA, RCEP, CCRP, Exercise Physiologist;Joseph Hood RCP,RRT,BSRT;Carroll Enterkin, RN, Geralyn Corwin, RN BSN    Medication changes reported      No    Fall or balance concerns reported     No    Tobacco Cessation  No Change    Warm-up and Cool-down  Performed on first and last piece of equipment    Resistance Training Performed  Yes    VAD Patient?  No    PAD/SET Patient?  No      Pain Assessment   Currently in Pain?  No/denies    Multiple Pain Sites  No          Social History   Tobacco Use  Smoking Status Never Smoker  Smokeless Tobacco Never Used    Goals Met:  Independence with exercise equipment Exercise tolerated well No report of cardiac concerns or symptoms Strength training completed today  Goals Unmet:  Not Applicable  Comments: Pt able to follow exercise prescription today without complaint.  Will continue to monitor for progression.    Dr. Emily Filbert is Medical Director for [place] [facility].

## 2018-08-04 ENCOUNTER — Telehealth: Payer: Self-pay

## 2018-08-04 NOTE — Telephone Encounter (Signed)
Attempted to r/s vascular appt to 9/18 with gina  .  lmov

## 2018-08-05 ENCOUNTER — Encounter: Payer: Self-pay | Admitting: *Deleted

## 2018-08-05 ENCOUNTER — Ambulatory Visit (INDEPENDENT_AMBULATORY_CARE_PROVIDER_SITE_OTHER): Payer: Self-pay

## 2018-08-05 DIAGNOSIS — I70209 Unspecified atherosclerosis of native arteries of extremities, unspecified extremity: Secondary | ICD-10-CM

## 2018-08-05 DIAGNOSIS — I214 Non-ST elevation (NSTEMI) myocardial infarction: Secondary | ICD-10-CM

## 2018-08-05 DIAGNOSIS — Z955 Presence of coronary angioplasty implant and graft: Secondary | ICD-10-CM

## 2018-08-05 NOTE — Progress Notes (Signed)
Daily Session Note  Patient Details  Name: Kelsey Terry MRN: [id number] Date of Birth: July 12, 1961 Referring Provider:     Cardiac Rehab from 06/09/2018 in [facility] Cardiac and Pulmonary Rehab  Referring Provider  Kathlyn Sacramento MD      Encounter Date: 08/05/2018  Check In: Session Check In - 08/05/18 0956      Check-In   Supervising physician immediately available to respond to emergencies  See telemetry face sheet for immediately available ER MD    Location  [facility]-Cardiac & Pulmonary Rehab    Staff Present  Alberteen Sam, MA, RCEP, CCRP, Exercise Physiologist;Joseph Foy [place], BA, ACSM CEP, Exercise Physiologist;Krista Frederico Hamman, RN BSN    Medication changes reported      No    Fall or balance concerns reported     No    Warm-up and Cool-down  Performed on first and last piece of equipment    Resistance Training Performed  Yes    VAD Patient?  No    PAD/SET Patient?  No      Pain Assessment   Currently in Pain?  No/denies          Social History   Tobacco Use  Smoking Status Never Smoker  Smokeless Tobacco Never Used    Goals Met:  Independence with exercise equipment Exercise tolerated well Personal goals reviewed No report of cardiac concerns or symptoms Strength training completed today  Goals Unmet:  Not Applicable  Comments: Pt able to follow exercise prescription today without complaint.  Will continue to monitor for progression.    Dr. Emily Filbert is Medical Director for [place] and [facility].

## 2018-08-06 NOTE — Telephone Encounter (Signed)
Received Paperwork from [employer] in mail for [employer]

## 2018-08-07 ENCOUNTER — Other Ambulatory Visit: Payer: Self-pay

## 2018-08-07 ENCOUNTER — Encounter: Payer: Self-pay | Admitting: Pharmacist

## 2018-08-07 ENCOUNTER — Telehealth: Payer: Self-pay | Admitting: Cardiovascular Disease

## 2018-08-07 ENCOUNTER — Ambulatory Visit: Payer: Self-pay | Admitting: Pharmacist

## 2018-08-07 VITALS — BP 100/66 | Ht 63.0 in | Wt 206.0 lb

## 2018-08-07 DIAGNOSIS — Z79899 Other long term (current) drug therapy: Secondary | ICD-10-CM

## 2018-08-07 DIAGNOSIS — I214 Non-ST elevation (NSTEMI) myocardial infarction: Secondary | ICD-10-CM

## 2018-08-07 DIAGNOSIS — Z955 Presence of coronary angioplasty implant and graft: Secondary | ICD-10-CM

## 2018-08-07 NOTE — Progress Notes (Signed)
Daily Session Note  Patient Details  Name: Kelsey Terry MRN: [id number] Date of Birth: Mar 04, 1961 Referring Provider:     Cardiac Rehab from 06/09/2018 in [facility] Cardiac and Pulmonary Rehab  Referring Provider  Kathlyn Sacramento MD      Encounter Date: 08/07/2018  Check In: Session Check In - 08/07/18 4076      Check-In   Supervising physician immediately available to respond to emergencies  See telemetry face sheet for immediately available ER MD    Staff Present  Alberteen Sam, MA, RCEP, CCRP, Exercise Physiologist;Joseph Foy [place], BA, ACSM CEP, Exercise Physiologist;Carroll Enterkin, RN, BSN    Medication changes reported      No    Fall or balance concerns reported     No    Warm-up and Cool-down  Performed on first and last piece of equipment    Resistance Training Performed  Yes    VAD Patient?  No    PAD/SET Patient?  No      Pain Assessment   Currently in Pain?  No/denies    Multiple Pain Sites  No          Social History   Tobacco Use  Smoking Status Never Smoker  Smokeless Tobacco Never Used    Goals Met:  Independence with exercise equipment Exercise tolerated well No report of cardiac concerns or symptoms Strength training completed today  Goals Unmet:  Not Applicable  Comments: Pt able to follow exercise prescription today without complaint.  Will continue to monitor for progression.    Dr. Emily Filbert is Medical Director for [place] and [facility].

## 2018-08-07 NOTE — Telephone Encounter (Signed)
I called and spoke with the patient regarding her abnormal LE art duplex & ABI's. She currently has follow up scheduled with Dr. Fletcher Anon on 09/09/18. She is currently wearing a 30-day heart monitor. She is due to take her monitor off on 08/09/18 and mail this back to [employer].   I advised her I will send a message to Dr. Tyrell Antonio nurse, Lattie Haw, to see if she can find a spot to work her in sooner for her abnormal LE arterial scan.  The patient voices understanding and is agreeable. She is at Cardiac Rehab on Tuesdays/ Thursday from 9 am- 11 am and would like to try to make her appointment on either a Tuesday/ Thursday.  Will forward to [place] to review.

## 2018-08-07 NOTE — Progress Notes (Addendum)
Medication Management Clinic Visit Note  Patient: Kelsey Terry MRN: [id number] Date of Birth: 1961-09-20 PCP: Patient, No Pcp Per   [name] 57 y.o. female presents for a medication therapy management visit today.  BP 100/66 (BP Location: Right Arm, Patient Position: Sitting, Cuff Size: Normal)   Ht 5\' 3"  (1.6 m)   Wt 206 lb (93.4 kg)   BMI 36.49 kg/m   Patient Information   Past Medical History:  Diagnosis Date  . Asthma   . CAD in native artery    a. NSTEMI 05/16/18: [place] 05/16/18 - ost-pLAD 95% s/p PCI/DES, mLAD 30%, ostD1 70%, ost-pLCx 30%, mRCA 90%, dRCA 20%; b. staged PCI/DES to [facility] 7/1  . Diastolic dysfunction    a. TTE 05/16/18: EF 55-60%, no RWMA, Gr1DD, trivial AI, calcified mitral annulus, mild MR, mildly dilated LA  . Hypertension   . Lymphedema    LLE  . Pericarditis    a. ~ 2012 in [place], [place] s/p pericardiocentesis       Past Surgical History:  Procedure Laterality Date  . CESAREAN SECTION    . CORONARY STENT INTERVENTION N/A 05/16/2018   Procedure: CORONARY STENT INTERVENTION;  Surgeon: Wellington Hampshire, MD;  Location: [place] [facility];  Service: Cardiovascular;  Laterality: N/A;  . CORONARY STENT INTERVENTION N/A 05/19/2018   Procedure: CORONARY STENT INTERVENTION;  Surgeon: Wellington Hampshire, MD;  Location: [place] [facility];  Service: Cardiovascular;  Laterality: N/A;  . LEFT HEART CATH AND CORONARY ANGIOGRAPHY N/A 05/16/2018   Procedure: LEFT HEART CATH AND CORONARY ANGIOGRAPHY;  Surgeon: Wellington Hampshire, MD;  Location: [place] [facility];  Service: Cardiovascular;  Laterality: N/A;  . PERICARDIOCENTESIS       Family History  Problem Relation Age of Onset  . Hypertension Mother   . Pancreatic cancer Father   . Diabetes Brother     Family Support: Good  Lifestyle Diet: Breakfast: english muffin and tea, fruit bars Lunch:  Dinner:  Drinks: mostly water, 1/2 cup orange juice in the morning, coffee, ginger ale [once in a while]            Social History   Substance and Sexual Activity  Alcohol Use Not Currently      Social History   Tobacco Use  Smoking Status Never Smoker  Smokeless Tobacco Never Used      Health Maintenance  Topic Date Due  . Hepatitis C Screening  September 26, 1961  . HIV Screening  07/15/1974  . TETANUS/TDAP  07/15/1978  . PAP SMEAR  07/15/1980  . MAMMOGRAM  07/15/2009  . COLONOSCOPY  07/15/2009  . INFLUENZA VACCINE  06/19/2018   Outpatient Encounter Medications as of 08/07/2018  Medication Sig  . aspirin 81 MG EC tablet Take 1 tablet (81 mg total) by mouth daily.  Marland Kitchen losartan-hydrochlorothiazide (HYZAAR) 100-12.5 MG tablet Take 1 tablet by mouth daily.  . potassium chloride (K-DUR) 10 MEQ tablet Take 20 mEq by mouth daily.  . rosuvastatin (CRESTOR) 40 MG tablet Take 1 tablet (40 mg total) by mouth daily.  . ticagrelor (BRILINTA) 90 MG TABS tablet Take 1 tablet (90 mg total) by mouth 2 (two) times daily.   No facility-administered encounter medications on file as of 08/07/2018.    Health Maintenance/Date Completed  Last ED visit: 06/30/18 Specialist Visit: Upcoming visit with cardiologist Eye Exam: Annually Pelvic/PAP Exam: Annually Mammogram: Annually Colonoscopy: 39yrs ago  Assessment and Plan:  Compliance: Patient brought her medications for the visit. She is aware of the doses  and indications for all medications.  Asthma:  Asthma is well controlled and does not require treatment at the moment.  NSTEMI: Patient is taking aspirin, Brilinta, and rosuvastatin. She states the medications are working effectively. She has not experienced any side effects with aspirin or rosuvastatin. She has noticed some side effects with Brilinta such dry mouth and bruising. Patient states the medication must be taken with a heavy meal to decrease the risk of experiencing dizziness. Patient states the side effects of the Brilinta are manageable.  HTN: Patient is taking losartan and HCTZ to  manage blood pressure. Patient's blood pressure reading during the visit was 100/66mmHg. Patient does not complain of any side effects with these medications.  Medication Intolerance: Patient verbally confirmed an intolerance to carvedilol. She states the medication made her pass out and experience cold sweats.  Return for next visit in 1 year.  Preston Weill PharmD Candidate  Cosigned: Keri K. Dicky Doe, PharmD Medication Management Clinic [place] Operations Coordinator [phone]

## 2018-08-08 NOTE — Telephone Encounter (Signed)
Patient called and made appointment for 09/04/18. At this point there was not anything sooner since she is unable to come anytime other than Tuesday/Thursday afternoon.

## 2018-08-12 ENCOUNTER — Other Ambulatory Visit: Payer: Self-pay | Admitting: *Deleted

## 2018-08-12 ENCOUNTER — Encounter: Payer: Self-pay | Admitting: *Deleted

## 2018-08-12 DIAGNOSIS — Z955 Presence of coronary angioplasty implant and graft: Secondary | ICD-10-CM

## 2018-08-12 DIAGNOSIS — R55 Syncope and collapse: Secondary | ICD-10-CM

## 2018-08-12 DIAGNOSIS — I214 Non-ST elevation (NSTEMI) myocardial infarction: Secondary | ICD-10-CM

## 2018-08-12 NOTE — Progress Notes (Signed)
Daily Session Note  Patient Details  Name: Kelsey Terry MRN: [id number] Date of Birth: 20-Dec-1960 Referring Provider:     Cardiac Rehab from 06/09/2018 in [facility] Cardiac and Pulmonary Rehab  Referring Provider  Kathlyn Sacramento MD      Encounter Date: 08/12/2018  Check In: Session Check In - 08/12/18 0931      Check-In   Supervising physician immediately available to respond to emergencies  See telemetry face sheet for immediately available ER MD    Location  [facility]-Cardiac & Pulmonary Rehab    Staff Present  Carson Myrtle, BS, RRT, Respiratory Lennie Hummer, MA, RCEP, CCRP, Exercise Physiologist;Joseph Foy [place], BA, ACSM CEP, Exercise Physiologist;Susanne Bice, RN, BSN, CCRP    Medication changes reported      No    Fall or balance concerns reported     No    Warm-up and Cool-down  Performed on first and last piece of equipment    Resistance Training Performed  Yes    VAD Patient?  No    PAD/SET Patient?  No      Pain Assessment   Currently in Pain?  No/denies          Social History   Tobacco Use  Smoking Status Never Smoker  Smokeless Tobacco Never Used    Goals Met:  Independence with exercise equipment Exercise tolerated well No report of cardiac concerns or symptoms Strength training completed today  Goals Unmet:  Not Applicable  Comments: Pt able to follow exercise prescription today without complaint.  Will continue to monitor for progression.    Dr. Emily Filbert is Medical Director for [place] and [facility].

## 2018-08-14 DIAGNOSIS — I214 Non-ST elevation (NSTEMI) myocardial infarction: Secondary | ICD-10-CM

## 2018-08-14 NOTE — Progress Notes (Signed)
Daily Session Note  Patient Details  Name: Kelsey Terry MRN: [id number] Date of Birth: 03-16-1961 Referring Provider:     Cardiac Rehab from 06/09/2018 in [facility] Cardiac and Pulmonary Rehab  Referring Provider  Kathlyn Sacramento MD      Encounter Date: 08/14/2018  Check In: Session Check In - 08/14/18 0947      Check-In   Supervising physician immediately available to respond to emergencies  See telemetry face sheet for immediately available ER MD    Location  [facility]-Cardiac & Pulmonary Rehab    Staff Present  Alberteen Sam, MA, RCEP, CCRP, Exercise Physiologist;Ashby Moskal RCP,RRT,BSRT;Carroll Enterkin, RN, BSN;Leslie Psychologist, prison and probation services, BSN    Medication changes reported      No    Fall or balance concerns reported     No    Warm-up and Cool-down  Performed on first and last piece of Teacher, music Performed  Yes    VAD Patient?  No    PAD/SET Patient?  No      Pain Assessment   Currently in Pain?  No/denies          Social History   Tobacco Use  Smoking Status Never Smoker  Smokeless Tobacco Never Used    Goals Met:  Independence with exercise equipment Exercise tolerated well No report of cardiac concerns or symptoms Strength training completed today  Goals Unmet:  Not Applicable  Comments: Pt able to follow exercise prescription today without complaint.  Will continue to monitor for progression.    Dr. Emily Filbert is Medical Director for [place] and [facility].

## 2018-08-15 ENCOUNTER — Encounter: Payer: Self-pay | Admitting: Cardiovascular Disease

## 2018-08-15 ENCOUNTER — Ambulatory Visit (INDEPENDENT_AMBULATORY_CARE_PROVIDER_SITE_OTHER): Payer: Self-pay | Admitting: Cardiovascular Disease

## 2018-08-15 VITALS — BP 132/72 | Ht 63.0 in | Wt 202.2 lb

## 2018-08-15 DIAGNOSIS — I2510 Atherosclerotic heart disease of native coronary artery without angina pectoris: Secondary | ICD-10-CM

## 2018-08-15 DIAGNOSIS — E785 Hyperlipidemia, unspecified: Secondary | ICD-10-CM

## 2018-08-15 DIAGNOSIS — I739 Peripheral vascular disease, unspecified: Secondary | ICD-10-CM

## 2018-08-15 DIAGNOSIS — I10 Essential (primary) hypertension: Secondary | ICD-10-CM

## 2018-08-15 NOTE — Patient Instructions (Signed)
Medication Instructions: Your physician recommends that you continue on your current medications as directed. Please refer to the Current Medication list given to you today.  If you need a refill on your cardiac medications before your next appointment, please call your pharmacy.   Follow-Up: Your physician wants you to follow-up in 6 months with Dr. Arida. You will receive a reminder letter in the mail two months in advance. If you don't receive a letter, please call our office at [phone] to schedule this follow-up appointment.  Thank you for choosing [facility]!    

## 2018-08-15 NOTE — Progress Notes (Signed)
Cardiology Office Note   Date:  08/15/2018   ID:  Kelsey Terry, DOB 09-Jun-1961, MRN [id number]  PCP:  Patient, No Pcp Per  Cardiologist:   Kathlyn Sacramento, MD   No chief complaint on file.     History of Present Illness: Kelsey Terry is a 57 y.o. female who presents for a follow-up visit regarding coronary artery disease and peripheral arterial disease.  She had prior pericarditis and pericardiocentesis remotely in [place], diastolic heart failure, history of presyncope, hypertension, asthma and lymphedema. She was hospitalized in June with non-ST elevation myocardial infarction.  Left heart catheterization showed severe two-vessel coronary artery disease with 95% proximal LAD stenosis and 90% mid RCA disease.  She underwent successful PCI and drug-eluting stent placement to ostial/proximal LAD followed by staged RCA PCI.  The patient had confusion and slurred speech after the first cardiac catheterization.  CTA showed no evidence of stroke.  Echocardiogram showed normal ejection fraction with mild mitral regurgitation. The patient had her second PCI done via the right femoral artery due to previous radial artery spasm.  She was noted to have incidental occlusion of the right SFA. She had presyncope in August with no clear explanation.  The patient overall seem to have situational anxiety.  She has been attending cardiac rehab.  She had a 30-day monitor done which showed sinus rhythm with no evidence of atrial fibrillation.  There was one run of SVT lasting 30 seconds and 5 beat run of ventricular tachycardia.  The patient has previously discontinued carvedilol due to complaints of fatigue. The patient underwent vascular evaluation which showed an ABI of 0.69 on the right and 0.71 on the left.  Duplex showed bilateral SFA occlusion. She has been doing well with no recent chest pain, shortness of breath, palpitations, dizziness or syncope.   Past Medical History:  Diagnosis Date  . Asthma    . CAD in native artery    a. NSTEMI 05/16/18: [place] 05/16/18 - ost-pLAD 95% s/p PCI/DES, mLAD 30%, ostD1 70%, ost-pLCx 30%, mRCA 90%, dRCA 20%; b. staged PCI/DES to [facility] 7/1  . Diastolic dysfunction    a. TTE 05/16/18: EF 55-60%, no RWMA, Gr1DD, trivial AI, calcified mitral annulus, mild MR, mildly dilated LA  . Hypertension   . Lymphedema    LLE  . Pericarditis    a. ~ 2012 in [place], [place] s/p pericardiocentesis     Past Surgical History:  Procedure Laterality Date  . ABDOMINAL HYSTERECTOMY    . CESAREAN SECTION    . CORONARY STENT INTERVENTION N/A 05/16/2018   Procedure: CORONARY STENT INTERVENTION;  Surgeon: Wellington Hampshire, MD;  Location: [place] [facility];  Service: Cardiovascular;  Laterality: N/A;  . CORONARY STENT INTERVENTION N/A 05/19/2018   Procedure: CORONARY STENT INTERVENTION;  Surgeon: Wellington Hampshire, MD;  Location: [place] [facility];  Service: Cardiovascular;  Laterality: N/A;  . LEFT HEART CATH AND CORONARY ANGIOGRAPHY N/A 05/16/2018   Procedure: LEFT HEART CATH AND CORONARY ANGIOGRAPHY;  Surgeon: Wellington Hampshire, MD;  Location: [place] [facility];  Service: Cardiovascular;  Laterality: N/A;  . PERICARDIOCENTESIS       Current Outpatient Medications  Medication Sig Dispense Refill  . aspirin 81 MG EC tablet Take 1 tablet (81 mg total) by mouth daily. 90 tablet 0  . losartan-hydrochlorothiazide (HYZAAR) 100-12.5 MG tablet Take 1 tablet by mouth daily. 90 tablet 3  . potassium chloride (K-DUR) 10 MEQ tablet Take 20 mEq by mouth daily.    Marland Kitchen  rosuvastatin (CRESTOR) 40 MG tablet Take 1 tablet (40 mg total) by mouth daily. 90 tablet 3  . ticagrelor (BRILINTA) 90 MG TABS tablet Take 1 tablet (90 mg total) by mouth 2 (two) times daily. 32 tablet 0   No current facility-administered medications for this visit.     Allergies:   Amoxicillin    Social History:  The patient  reports that she has never smoked. She has never used smokeless tobacco. She reports that  she drank alcohol. She reports that she does not use drugs.   Family History:  The patient's family history includes Diabetes in her brother; Hypertension in her mother; Pancreatic cancer in her father.    ROS:  Please see the history of present illness.   Otherwise, review of systems are positive for none.   All other systems are reviewed and negative.    PHYSICAL EXAM: VS:  There were no vitals taken for this visit. , BMI There is no height or weight on file to calculate BMI. GEN: Well nourished, well developed, in no acute distress  HEENT: normal  Neck: no JVD, carotid bruits, or masses Cardiac: RRR; no murmurs, rubs, or gallops,no edema  Respiratory:  clear to auscultation bilaterally, normal work of breathing GI: soft, nontender, nondistended, + BS MS: no deformity or atrophy  Skin: warm and dry, no rash Neuro:  Strength and sensation are intact Psych: euthymic mood, full affect   EKG:  EKG is ordered today. The ekg ordered today demonstrates normal sinus rhythm with no significant ST or T wave changes.   Recent Labs: 05/16/2018: TSH 1.115 05/17/2018: Magnesium 1.9 06/30/2018: BUN 19; Creatinine, Ser 0.92; Potassium 3.5; Sodium 140 07/08/2018: ALT 17; Hemoglobin 12.5; Platelets 295    Lipid Panel    Component Value Date/Time   CHOL 162 07/08/2018 1504   TRIG 135 07/08/2018 1504   HDL 49 07/08/2018 1504   CHOLHDL 3.3 07/08/2018 1504   CHOLHDL 4.2 05/16/2018 0009   VLDL 9 05/16/2018 0009   LDLCALC 86 07/08/2018 1504      Wt Readings from Last 3 Encounters:  08/07/18 206 lb (93.4 kg)  07/08/18 203 lb 4 oz (92.2 kg)  06/30/18 200 lb 9.9 oz (91 kg)        No flowsheet data found.    ASSESSMENT AND PLAN:  1.  Coronary artery disease involving native coronary arteries without angina: She is doing extremely well with no anginal symptoms.  Continue dual antiplatelet therapy for at least one year.  Continue aggressive treatment of risk factors.  2.  Peripheral  arterial disease: She was found to have bilateral occlusion of the SFA with moderately reduced ABI.  In spite of that, she denies any claudication.  Thus, I recommend continuing medical therapy without revascularization.  3.  Hyperlipidemia: Lipid profile improved significantly with rosuvastatin with most recent LDL of 86.  The dose was increased to 40 mg daily after that.  If LDL remains above 70, we can consider adding Zetia.  4.  Essential hypertension: Blood pressure is controlled on current medications.  5.  Previous presyncope: I suspect was likely due to bradycardia.  Outpatient monitor did show one run of short SVT and 4 beat run of ventricular tachycardia.  I am hesitant to add a beta-blocker again given that her heart rate tends to be on the low side.  Continue to monitor for now.  Her ejection fraction was normal.    Disposition:   FU with me in 6 months  Signed,  Kathlyn Sacramento, MD  08/15/2018 1:25 PM    [facility]

## 2018-08-19 ENCOUNTER — Encounter: Payer: Self-pay | Attending: Cardiovascular Disease

## 2018-08-19 DIAGNOSIS — Z7982 Long term (current) use of aspirin: Secondary | ICD-10-CM | POA: Insufficient documentation

## 2018-08-19 DIAGNOSIS — Z955 Presence of coronary angioplasty implant and graft: Secondary | ICD-10-CM | POA: Insufficient documentation

## 2018-08-19 DIAGNOSIS — I2510 Atherosclerotic heart disease of native coronary artery without angina pectoris: Secondary | ICD-10-CM | POA: Insufficient documentation

## 2018-08-19 DIAGNOSIS — I10 Essential (primary) hypertension: Secondary | ICD-10-CM | POA: Insufficient documentation

## 2018-08-19 DIAGNOSIS — Z79899 Other long term (current) drug therapy: Secondary | ICD-10-CM | POA: Insufficient documentation

## 2018-08-19 DIAGNOSIS — I890 Lymphedema, not elsewhere classified: Secondary | ICD-10-CM | POA: Insufficient documentation

## 2018-08-19 DIAGNOSIS — I214 Non-ST elevation (NSTEMI) myocardial infarction: Secondary | ICD-10-CM | POA: Insufficient documentation

## 2018-08-19 NOTE — Progress Notes (Signed)
Daily Session Note  Patient Details  Name: Kelsey Terry MRN: [id number] Date of Birth: Jul 24, 1961 Referring Provider:     Cardiac Rehab from 06/09/2018 in [facility] Cardiac and Pulmonary Rehab  Referring Provider  Kathlyn Sacramento MD      Encounter Date: 08/19/2018  Check In: Session Check In - 08/19/18 0925      Check-In   Supervising physician immediately available to respond to emergencies  See telemetry face sheet for immediately available ER MD    Location  [facility]-Cardiac & Pulmonary Rehab    Staff Present  Carson Myrtle, BS, RRT, Respiratory Therapist;Mary Kellie Shropshire, RN, BSN, [place], MA, RCEP, CCRP, Exercise Physiologist;Merl Guardino Oletta Darter, [place], ACSM CEP, Exercise Physiologist    Medication changes reported      No    Fall or balance concerns reported     No    Warm-up and Cool-down  Performed on first and last piece of equipment    Resistance Training Performed  Yes    VAD Patient?  No    PAD/SET Patient?  No      Pain Assessment   Currently in Pain?  No/denies    Multiple Pain Sites  No          Social History   Tobacco Use  Smoking Status Never Smoker  Smokeless Tobacco Never Used    Goals Met:  Independence with exercise equipment Exercise tolerated well No report of cardiac concerns or symptoms Strength training completed today  Goals Unmet:  Not Applicable  Comments: Pt able to follow exercise prescription today without complaint.  Will continue to monitor for progression.    Dr. Emily Filbert is Medical Director for [place] and [facility].

## 2018-08-21 ENCOUNTER — Telehealth: Payer: Self-pay | Admitting: Cardiovascular Disease

## 2018-08-21 VITALS — Ht 63.6 in | Wt 204.0 lb

## 2018-08-21 DIAGNOSIS — I214 Non-ST elevation (NSTEMI) myocardial infarction: Secondary | ICD-10-CM

## 2018-08-21 DIAGNOSIS — Z955 Presence of coronary angioplasty implant and graft: Secondary | ICD-10-CM

## 2018-08-21 NOTE — Progress Notes (Signed)
Daily Session Note  Patient Details  Name: Kelsey Terry MRN: [id number] Date of Birth: May 06, 1961 Referring Provider:     Cardiac Rehab from 06/09/2018 in [facility] Cardiac and Pulmonary Rehab  Referring Provider  Kathlyn Sacramento MD      Encounter Date: 08/21/2018  Check In: Session Check In - 08/21/18 0928      Check-In   Supervising physician immediately available to respond to emergencies  See telemetry face sheet for immediately available ER MD    Location  [facility]-Cardiac & Pulmonary Rehab    Staff Present  Alberteen Sam, MA, RCEP, CCRP, Exercise Physiologist;Diyari Cherne RCP,RRT,BSRT;Carroll Enterkin, Therapist, sports, BSN    Medication changes reported      No    Fall or balance concerns reported     No    Warm-up and Cool-down  Performed on first and last piece of equipment    Resistance Training Performed  Yes    VAD Patient?  No    PAD/SET Patient?  No      Pain Assessment   Currently in Pain?  No/denies          Social History   Tobacco Use  Smoking Status Never Smoker  Smokeless Tobacco Never Used    Goals Met:  Independence with exercise equipment Exercise tolerated well No report of cardiac concerns or symptoms Strength training completed today  Goals Unmet:  Not Applicable  Comments: Pt able to follow exercise prescription today without complaint.  Will continue to monitor for progression. [place] Name 06/09/18 1245 08/21/18 1030       6 Minute Walk   Phase  Initial  Discharge    Distance  1070 feet  1270 feet    Distance % Change  -  18.7 %    Distance Feet Change  -  200 ft    Walk Time  6 minutes  6 minutes    # of Rest Breaks  0  0    MPH  2.03  2.41    METS  3.15  3.14    RPE  11  15    VO2 Peak  11  10.97    Symptoms  No  No    Resting HR  57 bpm  79 bpm    Resting BP  132/58  128/60    Resting Oxygen Saturation   98 %  -    Exercise Oxygen Saturation  during 6 min walk  100 %  -    Max Ex. HR  112 bpm  104 bpm    Max Ex. BP  172/74   148/64    2 Minute Post BP  124/72  -         Dr. Emily Filbert is Medical Director for [place] and [facility].

## 2018-08-21 NOTE — Telephone Encounter (Signed)
Patient came by office Still would like to speak with nurse Please call to discuss

## 2018-08-21 NOTE — Patient Instructions (Signed)
Discharge Patient Instructions  Patient Details  Name: Kelsey Terry MRN: [id number] Date of Birth: 1960/12/08 Referring Provider:  Wellington Hampshire, MD   Number of Visits: 74  Reason for Discharge:  Patient reached a stable level of exercise. Patient independent in their exercise. Patient has met program and personal goals.  Smoking History:  Social History   Tobacco Use  Smoking Status Never Smoker  Smokeless Tobacco Never Used    Diagnosis:  NSTEMI (non-ST elevated myocardial infarction) ([place])  Status post coronary artery stent placement  Initial Exercise Prescription: Initial Exercise Prescription - 06/09/18 1200      Date of Initial Exercise RX and Referring Provider   Date  06/09/18    Referring Provider  Kathlyn Sacramento MD      Treadmill   MPH  2    Grade  0.5    Minutes  15    METs  2.69      NuStep   Level  2    SPM  80    Minutes  15    METs  2      Recumbant Elliptical   Level  1    RPM  50    Minutes  15    METs  2      Prescription Details   Frequency (times per week)  2    Duration  Progress to 30 minutes of continuous aerobic without signs/symptoms of physical distress      Intensity   THRR 40-80% of Max Heartrate  99-141    Ratings of Perceived Exertion  11-13    Perceived Dyspnea  0-4      Progression   Progression  Continue to progress workloads to maintain intensity without signs/symptoms of physical distress.      Resistance Training   Training Prescription  Yes    Weight  3 lbs    Reps  10-15       Discharge Exercise Prescription (Final Exercise Prescription Changes): Exercise Prescription Changes - 08/18/18 1600      Response to Exercise   Blood Pressure (Admit)  124/74    Blood Pressure (Exercise)  148/62    Blood Pressure (Exit)  126/60    Heart Rate (Admit)  68 bpm    Heart Rate (Exercise)  116 bpm    Heart Rate (Exit)  77 bpm    Rating of Perceived Exertion (Exercise)  13    Symptoms  none    Duration   Continue with 30 min of aerobic exercise without signs/symptoms of physical distress.    Intensity  THRR unchanged      Progression   Progression  Continue to progress workloads to maintain intensity without signs/symptoms of physical distress.    Average METs  2.54      Resistance Training   Training Prescription  Yes    Weight  3 lbs    Reps  10-15      Interval Training   Interval Training  No      Treadmill   MPH  2.3    Grade  4    Minutes  15    METs  3.9      NuStep   Level  3    Minutes  15    METs  2      Recumbant Elliptical   Level  4    Minutes  15    METs  1.7      Home Exercise Plan   Plans  to continue exercise at  [employer] (comment)    Frequency  Add 3 additional days to program exercise sessions.    Initial Home Exercises Provided  07/01/18       Functional Capacity: 6 Minute Walk    Row Name 06/09/18 1245 08/21/18 1030       6 Minute Walk   Phase  Initial  Discharge    Distance  1070 feet  1270 feet    Distance % Change  -  18.7 %    Distance Feet Change  -  200 ft    Walk Time  6 minutes  6 minutes    # of Rest Breaks  0  0    MPH  2.03  2.41    METS  3.15  3.14    RPE  11  15    VO2 Peak  11  10.97    Symptoms  No  No    Resting HR  57 bpm  79 bpm    Resting BP  132/58  128/60    Resting Oxygen Saturation   98 %  -    Exercise Oxygen Saturation  during 6 min walk  100 %  -    Max Ex. HR  112 bpm  104 bpm    Max Ex. BP  172/74  148/64    2 Minute Post BP  124/72  -       Quality of Life: Quality of Life - 06/09/18 1058      Quality of Life   Select  Quality of Life      Quality of Life Scores   Health/Function Pre  19.61 %    Socioeconomic Pre  6.2 %    Psych/Spiritual Pre  20.57 %    Family Pre  16.13 %    GLOBAL Pre  17.3 %       Personal Goals: Goals established at orientation with interventions provided to work toward goal. Personal Goals and Risk Factors at Admission - 06/09/18 1306      Core  Components/Risk Factors/Patient Goals on Admission    Weight Management  Yes;Weight Loss    Intervention  Weight Management: Develop a combined nutrition and exercise program designed to reach desired caloric intake, while maintaining appropriate intake of nutrient and fiber, sodium and fats, and appropriate energy expenditure required for the weight goal.;Weight Management: Provide education and appropriate resources to help participant work on and attain dietary goals.;Weight Management/Obesity: Establish reasonable short term and long term weight goals.    Admit Weight  202 lb (91.6 kg)    Goal Weight: Short Term  197 lb (89.4 kg)    Goal Weight: Long Term  190 lb (86.2 kg)    Expected Outcomes  Short Term: Continue to assess and modify interventions until short term weight is achieved;Long Term: Adherence to nutrition and physical activity/exercise program aimed toward attainment of established weight goal;Weight Loss: Understanding of general recommendations for a balanced deficit meal plan, which promotes 1-2 lb weight loss per week and includes a negative energy balance of [id number] kcal/d;Understanding recommendations for meals to include 15-35% energy as protein, 25-35% energy from fat, 35-60% energy from carbohydrates, less than 224m of dietary cholesterol, 20-35 gm of total fiber daily;Understanding of distribution of calorie intake throughout the day with the consumption of 4-5 meals/snacks    Hypertension  Yes    Intervention  Provide education on lifestyle modifcations including regular physical activity/exercise, weight management, moderate sodium restriction and  increased consumption of fresh fruit, vegetables, and low fat dairy, alcohol moderation, and smoking cessation.;Monitor prescription use compliance.    Expected Outcomes  Short Term: Continued assessment and intervention until BP is < 140/38m HG in hypertensive participants. < 130/869mHG in hypertensive participants with diabetes,  heart failure or chronic kidney disease.;Long Term: Maintenance of blood pressure at goal levels.    Lipids  Yes    Intervention  Provide education and support for participant on nutrition & aerobic/resistive exercise along with prescribed medications to achieve LDL <7075mHDL >73m64m  Expected Outcomes  Short Term: Participant states understanding of desired cholesterol values and is compliant with medications prescribed. Participant is following exercise prescription and nutrition guidelines.;Long Term: Cholesterol controlled with medications as prescribed, with individualized exercise RX and with personalized nutrition plan. Value goals: LDL < 70mg36mL > 40 mg.        Personal Goals Discharge: Goals and Risk Factor Review - 08/05/18 1003      Core Components/Risk Factors/Patient Goals Review   Personal Goals Review  Weight Management/Obesity;Hypertension;Lipids    Review  BrendErnesteneoing well with her weight staying steady. She varies between 204-202.  She know she needs to sleep better as she is currently sleeping on couch. She is doing well with her blood pressures and she does check them on her own.  She is doing well with her medications.  She is using medication management clinic already.     Expected Outcomes  Short: Continue to work on weight loss.  Long: Continue to monitor risk factors.        Exercise Goals and Review: Exercise Goals    Row Name 06/09/18 1355             Exercise Goals   Increase Physical Activity  Yes       Intervention  Provide advice, education, support and counseling about physical activity/exercise needs.;Develop an individualized exercise prescription for aerobic and resistive training based on initial evaluation findings, risk stratification, comorbidities and participant's personal goals.       Expected Outcomes  Short Term: Attend rehab on a regular basis to increase amount of physical activity.;Long Term: Add in home exercise to make exercise part  of routine and to increase amount of physical activity.;Long Term: Exercising regularly at least 3-5 days a week.       Increase Strength and Stamina  Yes       Intervention  Provide advice, education, support and counseling about physical activity/exercise needs.;Develop an individualized exercise prescription for aerobic and resistive training based on initial evaluation findings, risk stratification, comorbidities and participant's personal goals.       Expected Outcomes  Short Term: Increase workloads from initial exercise prescription for resistance, speed, and METs.;Short Term: Perform resistance training exercises routinely during rehab and add in resistance training at home;Long Term: Improve cardiorespiratory fitness, muscular endurance and strength as measured by increased METs and functional capacity (6MWT)       Able to understand and use rate of perceived exertion (RPE) scale  Yes       Intervention  Provide education and explanation on how to use RPE scale       Expected Outcomes  Short Term: Able to use RPE daily in rehab to express subjective intensity level;Long Term:  Able to use RPE to guide intensity level when exercising independently       Able to understand and use Dyspnea scale  Yes  Intervention  Provide education and explanation on how to use Dyspnea scale       Expected Outcomes  Short Term: Able to use Dyspnea scale daily in rehab to express subjective sense of shortness of breath during exertion;Long Term: Able to use Dyspnea scale to guide intensity level when exercising independently       Knowledge and understanding of Target Heart Rate Range (THRR)  Yes       Intervention  Provide education and explanation of THRR including how the numbers were predicted and where they are located for reference       Expected Outcomes  Short Term: Able to state/look up THRR;Short Term: Able to use daily as guideline for intensity in rehab;Long Term: Able to use THRR to govern  intensity when exercising independently       Able to check pulse independently  Yes       Intervention  Provide education and demonstration on how to check pulse in carotid and radial arteries.;Review the importance of being able to check your own pulse for safety during independent exercise       Expected Outcomes  Short Term: Able to explain why pulse checking is important during independent exercise;Long Term: Able to check pulse independently and accurately       Understanding of Exercise Prescription  Yes       Intervention  Provide education, explanation, and written materials on patient's individual exercise prescription       Expected Outcomes  Short Term: Able to explain program exercise prescription;Long Term: Able to explain home exercise prescription to exercise independently          Nutrition & Weight - Outcomes: Pre Biometrics - 06/09/18 1249      Pre Biometrics   Height  5' 3.6" (1.615 m)    Weight  202 lb 3.2 oz (91.7 kg)    Waist Circumference  35 inches    Hip Circumference  44 inches    Waist to Hip Ratio  0.8 %    BMI (Calculated)  35.16    Single Leg Stand  1.41 seconds      Post Biometrics - 08/21/18 1031       Post  Biometrics   Height  5' 3.6" (1.615 m)    Weight  204 lb (92.5 kg)    Waist Circumference  35.5 inches    Hip Circumference  46 inches    Waist to Hip Ratio  0.77 %    BMI (Calculated)  35.48    Single Leg Stand  1.5 seconds       Nutrition: Nutrition Therapy & Goals - 07/01/18 1240      Nutrition Therapy   Diet  DASH    Protein (specify units)  10oz    Fiber  25 grams    Whole Grain Foods  3 servings   does not always choose whole grains   Saturated Fats  13 max. grams    Fruits and Vegetables  5 servings/day   8 ideal, recently increased her f&v intake   Sodium  1500 grams      Personal Nutrition Goals   Nutrition Goal  When eating frozen meals, keep sodium below 653m per meal and if you need to increase satiety/fullness you  can add an additional serving of vegetables to the meal    Personal Goal #2  Practice being more aware of the sodium content in foods by reading nutrition facts labels. For you, this will be especially  important for canned items, deli meats, and frozen meals.     Personal Goal #3  When eating out, look for ways to increase the nutritional value and decrease the sodium and fat content of the meal. For example, choosing steamed rather than fried entrees at a [employer] or ordering vegetables / a plain baked potato as a side instead of mashed potatoes or french fries    Personal Goal #4  Prioritize protein at meal times; choose lean/ non fried varieties most often    Comments  She has been eating mostly fruits and vegetables recently d/t being confused about what foods to eat when following a heart-healthy eating plan. She has been eating frozen meals frequently but tries to choose Healthy Choice or Lean Cuisine brands. She does not consume sugar sweetened beverages often but eats a lot of canned items.      Intervention Plan   Intervention  Prescribe, educate and counsel regarding individualized specific dietary modifications aiming towards targeted core components such as weight, hypertension, lipid management, diabetes, heart failure and other comorbidities.;Nutrition handout(s) given to patient.   low sodium nutrition therapy, guidelines for heart healthy eating handout   Expected Outcomes  Short Term Goal: Understand basic principles of dietary content, such as calories, fat, sodium, cholesterol and nutrients.;Short Term Goal: A plan has been developed with personal nutrition goals set during dietitian appointment.;Long Term Goal: Adherence to prescribed nutrition plan.       Nutrition Discharge: Nutrition Assessments - 06/09/18 1311      MEDFICTS Scores   Pre Score  36       Education Questionnaire Score: Knowledge Questionnaire Score - 06/09/18 1057      Knowledge Questionnaire  Score   Pre Score  21/26   Test reviewed with pt today.  Education Focus: Nutrition, Exercise,  Anatomy, HTN, Heart Failure      Goals reviewed with patient; copy given to patient.

## 2018-08-21 NOTE — Telephone Encounter (Signed)
S/w patient. She is dealing with stressful home situation. She and her 59 year old son live with her daughter. Patient said she and her son are desperately trying to find jobs at this time and are under a lot of stress. Patient says it is not good for them to stay with her daughter any longer. I gave her numbers to [employer] [facility] [place], [employer] and [employer]. She will try to call those places and call me back if she does not find any help. I offered support and she was appreciative.

## 2018-08-21 NOTE — Telephone Encounter (Signed)
Patient declines to share reason for call.  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  Please call.

## 2018-08-26 ENCOUNTER — Other Ambulatory Visit: Payer: Self-pay | Admitting: Cardiovascular Disease

## 2018-08-26 ENCOUNTER — Encounter: Payer: Self-pay | Admitting: *Deleted

## 2018-08-26 DIAGNOSIS — Z955 Presence of coronary angioplasty implant and graft: Secondary | ICD-10-CM

## 2018-08-26 DIAGNOSIS — I214 Non-ST elevation (NSTEMI) myocardial infarction: Secondary | ICD-10-CM

## 2018-08-26 MED ORDER — POTASSIUM CHLORIDE ER 10 MEQ PO TBCR: 20.0000 meq | EXTENDED_RELEASE_TABLET | Freq: Every day | ORAL | 1 refills | Status: DC

## 2018-08-26 NOTE — Progress Notes (Signed)
Cardiac Individual Treatment Plan  Patient Details  Name: Kelsey Terry MRN: [id number] Date of Birth: 04/03/1961 Referring Provider:     Cardiac Rehab from 06/09/2018 in [facility] Cardiac [facility] Pulmonary Rehab  Referring Provider  Kathlyn Sacramento MD      Initial Encounter Date:    Cardiac Rehab from 06/09/2018 in [facility] Cardiac and Pulmonary Rehab  Date  06/09/18      Visit Diagnosis: NSTEMI (non-ST elevated myocardial infarction) [facility])  Status post coronary artery stent placement  Patient's Home Medications on Admission:  Current Outpatient Medications:  .  aspirin 81 MG EC tablet, Take 1 tablet (81 mg total) by mouth daily., Disp: 90 tablet, Rfl: 0 .  losartan-hydrochlorothiazide (HYZAAR) 100-12.5 MG tablet, Take 1 tablet by mouth daily., Disp: 90 tablet, Rfl: 3 .  potassium chloride (K-DUR) 10 MEQ tablet, Take 20 mEq by mouth daily., Disp: , Rfl:  .  rosuvastatin (CRESTOR) 40 MG tablet, Take 1 tablet (40 mg total) by mouth daily., Disp: 90 tablet, Rfl: 3 .  ticagrelor (BRILINTA) 90 MG TABS tablet, Take 1 tablet (90 mg total) by mouth 2 (two) times daily., Disp: 32 tablet, Rfl: 0  Past Medical History: Past Medical History:  Diagnosis Date  . Asthma   . CAD in native artery    a. NSTEMI 05/16/18: [place] 05/16/18 - ost-pLAD 95% s/p PCI/DES, mLAD 30%, ostD1 70%, ost-pLCx 30%, mRCA 90%, dRCA 20%; b. staged PCI/DES to [facility] 7/1  . Diastolic dysfunction    a. TTE 05/16/18: EF 55-60%, no RWMA, Gr1DD, trivial AI, calcified mitral annulus, mild MR, mildly dilated LA  . Hypertension   . Lymphedema    LLE  . Pericarditis    a. ~ 2012 in [place], [place] s/p pericardiocentesis     Tobacco Use: Social History   Tobacco Use  Smoking Status Never Smoker  Smokeless Tobacco Never Used    Labs: Recent Review Scientist, physiological    Labs for ITP Cardiac and Pulmonary Rehab Latest Ref Rng & Units 05/16/2018 07/08/2018   Cholestrol 100 - 199 mg/dL 232(H) 162   LDLCALC 0 - 99 mg/dL 168(H) 86   HDL >39 mg/dL  55 49   Trlycerides 0 - 149 mg/dL 43 135   Hemoglobin A1c 4.8 - 5.6 % 5.7(H) -       Exercise Target Goals: Exercise Program Goal: Individual exercise prescription set using results from initial 6 min walk test and THRR while considering  patient's activity barriers and safety.   Exercise Prescription Goal: Initial exercise prescription builds to 30-45 minutes a day of aerobic activity, 2-3 days per week.  Home exercise guidelines will be given to patient during program as part of exercise prescription that the participant will acknowledge.  Activity Barriers & Risk Stratification: Activity Barriers & Cardiac Risk Stratification - 06/09/18 1246      Activity Barriers & Cardiac Risk Stratification   Activity Barriers  Deconditioning;Muscular Weakness;Balance Concerns;Other (comment)    Comments  Lymphodema in bilater legs, wears support stockings    Cardiac Risk Stratification  High       6 Minute Walk: 6 Minute Walk    Row Name 06/09/18 1245 08/21/18 1030       6 Minute Walk   Phase  Initial  Discharge    Distance  1070 feet  1270 feet    Distance % Change  -  18.7 %    Distance Feet Change  -  200 ft    Walk Time  6 minutes  6  minutes    # of Rest Breaks  0  0    MPH  2.03  2.41    METS  3.15  3.14    RPE  11  15    VO2 Peak  11  10.97    Symptoms  No  No    Resting HR  57 bpm  79 bpm    Resting BP  132/58  128/60    Resting Oxygen Saturation   98 %  -    Exercise Oxygen Saturation  during 6 min walk  100 %  -    Max Ex. HR  112 bpm  104 bpm    Max Ex. BP  172/74  148/64    2 Minute Post BP  124/72  -       Oxygen Initial Assessment:   Oxygen Re-Evaluation:   Oxygen Discharge (Final Oxygen Re-Evaluation):   Initial Exercise Prescription: Initial Exercise Prescription - 06/09/18 1200      Date of Initial Exercise RX and Referring Provider   Date  06/09/18    Referring Provider  Kathlyn Sacramento MD      Treadmill   MPH  2    Grade  0.5    Minutes   15    METs  2.69      NuStep   Level  2    SPM  80    Minutes  15    METs  2      Recumbant Elliptical   Level  1    RPM  50    Minutes  15    METs  2      Prescription Details   Frequency (times per week)  2    Duration  Progress to 30 minutes of continuous aerobic without signs/symptoms of physical distress      Intensity   THRR 40-80% of Max Heartrate  99-141    Ratings of Perceived Exertion  11-13    Perceived Dyspnea  0-4      Progression   Progression  Continue to progress workloads to maintain intensity without signs/symptoms of physical distress.      Resistance Training   Training Prescription  Yes    Weight  3 lbs    Reps  10-15       Perform Capillary Blood Glucose checks as needed.  Exercise Prescription Changes:  Exercise Prescription Changes    Row Name 06/09/18 1200 06/26/18 1100 07/01/18 1000 07/10/18 1100 07/22/18 1600     Response to Exercise   Blood Pressure (Admit)  132/58  120/68  -  120/72  142/96   Blood Pressure (Exercise)  172/74  134/70  -  140/68  146/84   Blood Pressure (Exit)  124/72  124/66  -  126/72  136/64   Heart Rate (Admit)  57 bpm  70 bpm  -  66 bpm  91 bpm   Heart Rate (Exercise)  112 bpm  103 bpm  -  109 bpm  120 bpm   Heart Rate (Exit)  59 bpm  63 bpm  -  65 bpm  75 bpm   Oxygen Saturation (Admit)  98 %  -  -  -  -   Oxygen Saturation (Exercise)  100 %  -  -  -  -   Rating of Perceived Exertion (Exercise)  11  13  -  13  13   Symptoms  none  none  -  none  none  Comments  walk test results  first full week of exercise   -  increased incline on the treadmill  -   Duration  -  Continue with 30 min of aerobic exercise without signs/symptoms of physical distress.  -  Continue with 30 min of aerobic exercise without signs/symptoms of physical distress.  Continue with 30 min of aerobic exercise without signs/symptoms of physical distress.   Intensity  -  THRR unchanged  -  THRR unchanged  THRR unchanged     Progression    Progression  -  Continue to progress workloads to maintain intensity without signs/symptoms of physical distress.  -  Continue to progress workloads to maintain intensity without signs/symptoms of physical distress.  Continue to progress workloads to maintain intensity without signs/symptoms of physical distress.   Average METs  -  2.28  -  2.28  2.66     Resistance Training   Training Prescription  -  Yes  -  Yes  Yes   Weight  -  3 lbs  -  3 lbs  3 lbs   Reps  -  10-15  -  10-15  10-15     Interval Training   Interval Training  -  No  -  -  No     Treadmill   MPH  -  2  -  2  2.5   Grade  -  0.5  -  1.5  0.5   Minutes  -  15  -  15  15   METs  -  2.67  -  2.67  3.09     NuStep   Level  -  3  -  3  3   Minutes  -  15  -  15  15   METs  -  1.9  -  1.9  3.4     Recumbant Elliptical   Level  -  -  -  2  2   Minutes  -  -  -  15  15   METs  -  -  -  2  1.5     Home Exercise Plan   Plans to continue exercise at  -  -  [employer] (comment) apartment gym   [employer] (comment)  [employer] (comment)   Frequency  -  -  Add 3 additional days to program exercise sessions.  Add 3 additional days to program exercise sessions.  Add 3 additional days to program exercise sessions.   Initial Home Exercises Provided  -  -  07/01/18  07/01/18  07/01/18   Row Name 08/06/18 1000 08/18/18 1600           Response to Exercise   Blood Pressure (Admit)  128/86  124/74      Blood Pressure (Exercise)  140/64  148/62      Blood Pressure (Exit)  126/60  126/60      Heart Rate (Admit)  74 bpm  68 bpm      Heart Rate (Exercise)  112 bpm  116 bpm      Heart Rate (Exit)  72 bpm  77 bpm      Rating of Perceived Exertion (Exercise)  13  13      Symptoms  none  none      Duration  Continue with 30 min of aerobic exercise without signs/symptoms of physical distress.  Continue with 30 min of aerobic exercise without signs/symptoms of physical distress.  Intensity  THRR unchanged   THRR unchanged        Progression   Progression  Continue to progress workloads to maintain intensity without signs/symptoms of physical distress.  Continue to progress workloads to maintain intensity without signs/symptoms of physical distress.      Average METs  2.23  2.54        Resistance Training   Training Prescription  Yes  Yes      Weight  3 lbs  3 lbs      Reps  10-15  10-15        Interval Training   Interval Training  No  No        Treadmill   MPH  2.5  2.3      Grade  0.5  4      Minutes  15  15      METs  3.09  3.9        NuStep   Level  3  3      Minutes  15  15      METs  2  2        Recumbant Elliptical   Level  2  4      Minutes  15  15      METs  1.6  1.7        Home Exercise Plan   Plans to continue exercise at  [employer] (comment)  Forensic scientist (comment)      Frequency  Add 3 additional days to program exercise sessions.  Add 3 additional days to program exercise sessions.      Initial Home Exercises Provided  07/01/18  07/01/18         Exercise Comments:  Exercise Comments    Row Name 06/19/18 1245 08/26/18 0928         Exercise Comments  First full day of exercise!  Patient was oriented to gym and equipment including functions, settings, policies, and procedures.  Patient's individual exercise prescription and treatment plan were reviewed.  All starting workloads were established based on the results of the 6 minute walk test done at initial orientation visit.  The plan for exercise progression was also introduced and progression will be customized based on patient's performance and goals.   Anushka graduated today from  rehab with 36 sessions completed.  Details of the patient's exercise prescription and what She needs to do in order to continue the prescription and progress were discussed with patient.  Patient was given a copy of prescription and goals.  Patient verbalized understanding.  Rosalena plans to continue to exercise by  walking at home.         Exercise Goals and Review:  Exercise Goals    Row Name 06/09/18 1355             Exercise Goals   Increase Physical Activity  Yes       Intervention  Provide advice, education, support and counseling about physical activity/exercise needs.;Develop an individualized exercise prescription for aerobic and resistive training based on initial evaluation findings, risk stratification, comorbidities and participant's personal goals.       Expected Outcomes  Short Term: Attend rehab on a regular basis to increase amount of physical activity.;Long Term: Add in home exercise to make exercise part of routine and to increase amount of physical activity.;Long Term: Exercising regularly at least 3-5 days a week.       Increase Strength and Stamina  Yes  Intervention  Provide advice, education, support and counseling about physical activity/exercise needs.;Develop an individualized exercise prescription for aerobic and resistive training based on initial evaluation findings, risk stratification, comorbidities and participant's personal goals.       Expected Outcomes  Short Term: Increase workloads from initial exercise prescription for resistance, speed, and METs.;Short Term: Perform resistance training exercises routinely during rehab and add in resistance training at home;Long Term: Improve cardiorespiratory fitness, muscular endurance and strength as measured by increased METs and functional capacity (6MWT)       Able to understand and use rate of perceived exertion (RPE) scale  Yes       Intervention  Provide education and explanation on how to use RPE scale       Expected Outcomes  Short Term: Able to use RPE daily in rehab to express subjective intensity level;Long Term:  Able to use RPE to guide intensity level when exercising independently       Able to understand and use Dyspnea scale  Yes       Intervention  Provide education and explanation on how to use Dyspnea  scale       Expected Outcomes  Short Term: Able to use Dyspnea scale daily in rehab to express subjective sense of shortness of breath during exertion;Long Term: Able to use Dyspnea scale to guide intensity level when exercising independently       Knowledge and understanding of Target Heart Rate Range (THRR)  Yes       Intervention  Provide education and explanation of THRR including how the numbers were predicted and where they are located for reference       Expected Outcomes  Short Term: Able to state/look up THRR;Short Term: Able to use daily as guideline for intensity in rehab;Long Term: Able to use THRR to govern intensity when exercising independently       Able to check pulse independently  Yes       Intervention  Provide education and demonstration on how to check pulse in carotid and radial arteries.;Review the importance of being able to check your own pulse for safety during independent exercise       Expected Outcomes  Short Term: Able to explain why pulse checking is important during independent exercise;Long Term: Able to check pulse independently and accurately       Understanding of Exercise Prescription  Yes       Intervention  Provide education, explanation, and written materials on patient's individual exercise prescription       Expected Outcomes  Short Term: Able to explain program exercise prescription;Long Term: Able to explain home exercise prescription to exercise independently          Exercise Goals Re-Evaluation : Exercise Goals Re-Evaluation    Row Name 06/19/18 4401 06/26/18 1127 07/01/18 1039 07/08/18 1025 07/10/18 1128     Exercise Goal Re-Evaluation   Exercise Goals Review  Understanding of Exercise Prescription;Knowledge and understanding of Target Heart Rate Range (THRR);Able to understand and use rate of perceived exertion (RPE) scale  Understanding of Exercise Prescription;Knowledge and understanding of Target Heart Rate Range (THRR);Able to understand and  use rate of perceived exertion (RPE) scale  Understanding of Exercise Prescription;Knowledge and understanding of Target Heart Rate Range (THRR);Able to understand and use rate of perceived exertion (RPE) scale  Increase Physical Activity;Increase Strength and Stamina;Understanding of Exercise Prescription  Increase Physical Activity;Increase Strength and Stamina;Understanding of Exercise Prescription   Comments  Reviewed RPE scale, THR and program prescription  with pt today.  Pt voiced understanding and was given a copy of goals to take home.   Zanaiya has been doing well in rehab. She is walking on the treadmill as well as using the REL on level 3. She has done well during her first full week of exercise and seems to be gaining strength with the weights.  Reviewed home exercise with pt today.  Pt plans to work out at her daughters apartment gym for exercise.  Reviewed THR, pulse, RPE, sign and symptoms, NTG use, and when to call 911 or MD.  Also discussed weather considerations and indoor options.  Pt voiced understanding.  Kandise is doing well in rehab.  She is using the treadmill and weights and recumbent bike in her daughter's gym at home.  She is starting to get her strength and stamina back.    Shantina is doing well in rehab.  She is using the treadmill and weights and recumbent bike in her daughter's gym at home.  She is starting to get her strength and stamina back.     Expected Outcomes  Short: Use RPE daily to regulate intensity. Long: Follow program prescription in THR.  Short: continue to attend rehab regularly. Long: Increase overall MET level.   Short: add an additional 3 days of exercise at home. Long: increase overall activity levels   Short: Continue to add in home exercise regularly.  Long: Continue to build strength and stamina.   Short: Continue to add in home exercise regularly.  Long: Continue to build strength and stamina.    Cec[place] Name 07/22/18 1654 08/05/18 0957 08/18/18 1638          Exercise Goal Re-Evaluation   Exercise Goals Review  Increase Physical Activity;Increase Strength and Stamina;Understanding of Exercise Prescription  Increase Physical Activity;Increase Strength and Stamina;Understanding of Exercise Prescription  Increase Physical Activity;Increase Strength and Stamina;Understanding of Exercise Prescription     Comments  Caleyah continues to do well in rehab.  She up to 2.5 mph on the treadmill.  We will continue to work on her progress.   Kele is doing well. She is doing her home exercise by using the treadmill, stepper, weights, and "the runner" at her daughter's gym in complex.  She is usually in there for about an hour total.  She has started to go to [employer] center senior exercise class.   Brynlie continues to do well in rehab.  She was able to get some new shoes with help from the [facility] and her feet are much happier!!  She is now doing 2.3 mph and 4% grade on the treadmill.  We will continue to monitor her progression.      Expected Outcomes  Short: Increase workload on XR and NuStep.  Long: Continue to exercise more independently.   Short: Continue to get in weights at home.  Long: Continue to exercise independently.   Short: Continue to increase workloads.  Long: Continue to go to gym at home.         Discharge Exercise Prescription (Final Exercise Prescription Changes): Exercise Prescription Changes - 08/18/18 1600      Response to Exercise   Blood Pressure (Admit)  124/74    Blood Pressure (Exercise)  148/62    Blood Pressure (Exit)  126/60    Heart Rate (Admit)  68 bpm    Heart Rate (Exercise)  116 bpm    Heart Rate (Exit)  77 bpm    Rating of Perceived Exertion (Exercise)  13    Symptoms  none    Duration  Continue with 30 min of aerobic exercise without signs/symptoms of physical distress.    Intensity  THRR unchanged      Progression   Progression  Continue to progress workloads to maintain intensity without  signs/symptoms of physical distress.    Average METs  2.54      Resistance Training   Training Prescription  Yes    Weight  3 lbs    Reps  10-15      Interval Training   Interval Training  No      Treadmill   MPH  2.3    Grade  4    Minutes  15    METs  3.9      NuStep   Level  3    Minutes  15    METs  2      Recumbant Elliptical   Level  4    Minutes  15    METs  1.7      Home Exercise Plan   Plans to continue exercise at  [employer] (comment)    Frequency  Add 3 additional days to program exercise sessions.    Initial Home Exercises Provided  07/01/18       Nutrition:  Target Goals: Understanding of nutrition guidelines, daily intake of sodium <1548m, cholesterol <2091m calories 30% from fat and 7% or less from saturated fats, daily to have 5 or more servings of fruits and vegetables.  Biometrics: Pre Biometrics - 06/09/18 1249      Pre Biometrics   Height  5' 3.6" (1.615 m)    Weight  202 lb 3.2 oz (91.7 kg)    Waist Circumference  35 inches    Hip Circumference  44 inches    Waist to Hip Ratio  0.8 %    BMI (Calculated)  35.16    Single Leg Stand  1.41 seconds      Post Biometrics - 08/21/18 1031       Post  Biometrics   Height  5' 3.6" (1.615 m)    Weight  204 lb (92.5 kg)    Waist Circumference  35.5 inches    Hip Circumference  46 inches    Waist to Hip Ratio  0.77 %    BMI (Calculated)  35.48    Single Leg Stand  1.5 seconds       Nutrition Therapy Plan and Nutrition Goals: Nutrition Therapy & Goals - 07/01/18 1240      Nutrition Therapy   Diet  DASH    Protein (specify units)  10oz    Fiber  25 grams    Whole Grain Foods  3 servings   does not always choose whole grains   Saturated Fats  13 max. grams    Fruits and Vegetables  5 servings/day   8 ideal, recently increased her f&v intake   Sodium  1500 grams      Personal Nutrition Goals   Nutrition Goal  When eating frozen meals, keep sodium below 60027mer meal and if  you need to increase satiety/fullness you can add an additional serving of vegetables to the meal    Personal Goal #2  Practice being more aware of the sodium content in foods by reading nutrition facts labels. For you, this will be especially important for canned items, deli meats, and frozen meals.     Personal Goal #3  When eating out, look for  ways to increase the nutritional value and decrease the sodium and fat content of the meal. For example, choosing steamed rather than fried entrees at a [employer] or ordering vegetables / a plain baked potato as a side instead of mashed potatoes or french fries    Personal Goal #4  Prioritize protein at meal times; choose lean/ non fried varieties most often    Comments  She has been eating mostly fruits and vegetables recently d/t being confused about what foods to eat when following a heart-healthy eating plan. She has been eating frozen meals frequently but tries to choose Healthy Choice or Lean Cuisine brands. She does not consume sugar sweetened beverages often but eats a lot of canned items.      Intervention Plan   Intervention  Prescribe, educate and counsel regarding individualized specific dietary modifications aiming towards targeted core components such as weight, hypertension, lipid management, diabetes, heart failure and other comorbidities.;Nutrition handout(s) given to patient.   low sodium nutrition therapy, guidelines for heart healthy eating handout   Expected Outcomes  Short Term Goal: Understand basic principles of dietary content, such as calories, fat, sodium, cholesterol and nutrients.;Short Term Goal: A plan has been developed with personal nutrition goals set during dietitian appointment.;Long Term Goal: Adherence to prescribed nutrition plan.       Nutrition Assessments: Nutrition Assessments - 08/26/18 1040      MEDFICTS Scores   Pre Score  36    Post Score  18    Score Difference  -18       Nutrition Goals  Re-Evaluation: Nutrition Goals Re-Evaluation    [place] Name 07/01/18 1309 07/08/18 1030 08/12/18 1135         Goals   Nutrition Goal  When eating frozen meals, keep sodium below 649m per meal and if you need to increase satiety/fullness you can add an additional serving of vegetables to the meal  lower sodium in meals, more protein, reading food labels  Practice becoming more aware of the sodium content in foods, and choosing lower sodium foods overall. Prioritize protein at meal times and make health-conscious swaps when eating out that help reduce fat and sodium content     Comment  She has been eating frozen meals for lunch and dinner regularly as of late but is unsure of what constitutes as a "healty" choice when reading the label  BMackensihas started to read her food labels more to cut back on sodium .   She is trying to get in more protein.  She has found ready labels to be shocking an discouraging.  It has been eye opening for her.,   She reports to be reading food labels regularly and has become more conscious of the foods she is puting into her body. She tries to buy low sodium options and has been doing additional research online to expand her knowledge on heart-healthy eating. She has also been puting more emphasis on eating protein sources and includes items like protein bars, trail mix, beans and dairy in addition to some meats     Expected Outcome  She will choose meals with less than 6065msodium, equal to or greater than 15g protein, zero trans fat and minimal saturated fat  Short: Reading labels to reduce sodium and increase her protein. Long: Continue to aim for healthier options.   She will read food labels consistently moving forward, keeping heart-healthy diet parameters for factors like sodium and types of fat in mind. She will consume  a variety of protien sources daily       Personal Goal #2 Re-Evaluation   Personal Goal #2  Practice being more aware of the sodium content in foods by  reading nutrition facts labels. For you, this will be especially important for canned items, deli meat and frozen meals  -  -       Personal Goal #3 Re-Evaluation   Personal Goal #3  When eating out, look for ways to increase the nutritional value and decrease the sodium and fat content of the meal. For example, choosing steamed rather than fried entrees at [employer] or ordering vegetables/ a plain baked potato as a side instead of mashed potaotes or french fries  -  -       Personal Goal #4 Re-Evaluation   Personal Goal #4  Prioritize protein at meal times; choose lean / non fried varieties most often  -  -        Nutrition Goals Discharge (Final Nutrition Goals Re-Evaluation): Nutrition Goals Re-Evaluation - 08/12/18 1135      Goals   Nutrition Goal  Practice becoming more aware of the sodium content in foods, and choosing lower sodium foods overall. Prioritize protein at meal times and make health-conscious swaps when eating out that help reduce fat and sodium content    Comment  She reports to be reading food labels regularly and has become more conscious of the foods she is puting into her body. She tries to buy low sodium options and has been doing additional research online to expand her knowledge on heart-healthy eating. She has also been puting more emphasis on eating protein sources and includes items like protein bars, trail mix, beans and dairy in addition to some meats    Expected Outcome  She will read food labels consistently moving forward, keeping heart-healthy diet parameters for factors like sodium and types of fat in mind. She will consume a variety of protien sources daily       Psychosocial: Target Goals: Acknowledge presence or absence of significant depression and/or stress, maximize coping skills, provide positive support system. Participant is able to verbalize types and ability to use techniques and skills needed for reducing stress and depression.    Initial Review & Psychosocial Screening: Initial Psych Review & Screening - 06/09/18 1311      Initial Review   Current issues with  Current Stress Concerns;Current Sleep Concerns   Lavada is currently sleeping on her daughter's couch until she finds a place of her own   Source of Stress Concerns  Family;Financial;Poor Coping Skills    Comments  Safire just moved here in May from [place]. She moved here with her son to be closer to her daughter. 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 She does not like to rely on her children to help her, but whenever she tries to drive her anxiety "acts up" and she ends up having her children drive her.       Family Dynamics   Good Support System?  Yes   family     Barriers   Psychosocial barriers to participate in program  There are no identifiable barriers or psychosocial needs.;The patient should benefit from training in stress management and relaxation.      Screening Interventions   Interventions  Encouraged to exercise;Provide feedback about the scores to participant;Program counselor consult;To provide support and resources with identified psychosocial needs  Expected Outcomes  Short Term goal: Utilizing psychosocial counselor, staff and physician to assist with identification of specific Stressors or current issues interfering with healing process. Setting desired goal for each stressor or current issue identified.;Long Term Goal: Stressors or current issues are controlled or eliminated.;Short Term goal: Identification and review with participant of any Quality of Life or Depression concerns found by scoring the questionnaire.;Long Term goal: The participant improves quality of Life and PHQ9 Scores as seen by post scores and/or verbalization of changes       Quality of Life Scores:  Quality of Life - 08/26/18 1040      Quality of Life   Select  Quality of Life      Quality of Life Scores    Health/Function Pre  19.61 %    Health/Function Post  22.21 %    Health/Function % Change  13.26 %    Socioeconomic Pre  6.2 %    Socioeconomic Post  15.6 %    Socioeconomic % Change   151.61 %    Psych/Spiritual Pre  20.57 %    Psych/Spiritual Post  24 %    Psych/Spiritual % Change  16.67 %    Family Pre  16.13 %    Family Post  22.25 %    Family % Change  37.94 %    GLOBAL Pre  17.3 %    GLOBAL Post  21.53 %    GLOBAL % Change  24.45 %      Scores of 19 and below usually indicate a poorer quality of life in these areas.  A difference of  2-3 points is a clinically meaningful difference.  A difference of 2-3 points in the total score of the Quality of Life Index has been associated with significant improvement in overall quality of life, self-image, physical symptoms, and general health in studies assessing change in quality of life.  PHQ-9: Recent Review Flowsheet Data    Depression screen [facility] 2/9 08/26/2018 06/09/2018   Decreased Interest 0 0   Down, Depressed, Hopeless 0 0   PHQ - 2 Score 0 0   Altered sleeping 0 0   Tired, decreased energy 0 1   Change in appetite 0 1   Feeling bad or failure about yourself  0 1   Trouble concentrating 0 1   Moving slowly or fidgety/restless 0 0   Suicidal thoughts 0 1    PHQ-9 Score 0 5   Difficult doing work/chores - Somewhat difficult     Interpretation of Total Score  Total Score Depression Severity:  1-4 = Minimal depression, 5-9 = Mild depression, 10-14 = Moderate depression, 15-19 = Moderately severe depression, 20-27 = Severe depression   Psychosocial Evaluation and Intervention: Psychosocial Evaluation - 06/24/18 1047      Psychosocial Evaluation & Interventions   Interventions  Encouraged to exercise with the program and follow exercise prescription;Stress management education;Relaxation education    Comments  Counselor met with Ms. Harrington Challenger [name]) today for initial psychosocial evaluation.  She is a 57 year old female who had  a heart attack and (2) stents inserted in July.  Kyrstyn has limited supports having just moved here from [place] and lives with her daughter and son, Rodman Key, who accompanied her to this meeting.  She reports sleeping "fair" and having a good appetite.  Lauralei denies a history of depression but admits to some symptoms of anxiety - particularly with the recent stress of the move and her health.  She also  reported having some thoughts of suicide several weeks ago but no current thoughts or plans.  Counselor encouraged Malayzia and her son to go to the ED if these thoughts return or worsen since she does not have a PCP yet in place.  Stress in Odessia's life are her health; the move; retirement; and particularly finances since she is too young to draw retirement or SS.  She also has conflict with her daughter whom she lives with.  Khalis was encouraged by this counselor to practice positive self-care by coming to exercise consistently and trying to find resources to be able to live outside her daughter's home in the near future.  Counselor gave [employer] on Stress management and learning to say "no" to others.  She agreed to set her health and her positive self-care as her goals and priorities at this time.  Counselor will follow with [name] - along with staff in this program.     Expected Outcomes  Short:  Meeya will go to the ED if her symptoms of depression or anxiety worsen.  She will exercise consistently to improve her mood and her energy levels.  Long:  Doshie will develop a positive self-care plan and utilize it consistently for her health and mental health.      Continue Psychosocial Services   Follow up required by counselor       Psychosocial Re-Evaluation: Psychosocial Re-Evaluation    [place] Name 07/08/18 1026 08/05/18 0958           Psychosocial Re-Evaluation   Current issues with  Current Stress Concerns  Current Stress Concerns;Current Sleep Concerns      Comments  Torunn continues to do well  in rehab.  She is still not comfortable at home.  She has not made progress towards moving out. She needs a job to be able to get out and take care of her needs.  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  I gave her vocational rehab paperwork .  Lorin Glass is doing okay. She is not used to not working and relying on others. She is working with vocational rehab.  Still doesn't not like living with her daughter.  She needs a job before she is able to move out.  She is in a financial tough spot and even borrowed some money for lunch last week.  She is interested in section 8 housing.  We will try to get some informations.  She has some info on 211 but has only looked at housing and will relook. She know she needs to sleep better as she is currently sleeping on couch.   We gave Moneka a gas card and started the process to get new shoes as well.       Expected Outcomes  Short: Start vocational rehab.  Long: Continue to stay postive.   Short: Get in touch with 211 for resources.  Long: Continue to work towards being able to move out of the house.       Interventions  -  Encouraged to attend Cardiac Rehabilitation for the exercise;Stress management education      Continue Psychosocial Services   -  Follow up required by counselor      Comments  -  Yuktha just moved here in May from [place]. She moved here with her son to be closer to her daughter. 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 She does not like to rely  on her children to help her, but whenever she tries to drive her anxiety "acts up" and she ends up having her children drive her.         Initial Review   Source of Stress Concerns  -  Family;Financial;Poor Coping Skills         Psychosocial Discharge (Final Psychosocial Re-Evaluation): Psychosocial Re-Evaluation - 08/05/18 0958      Psychosocial Re-Evaluation   Current issues with  Current Stress Concerns;Current Sleep Concerns    Comments  Lorin Glass is doing okay.  She is not used to not working and relying on others. She is working with vocational rehab.  Still doesn't not like living with her daughter.  She needs a job before she is able to move out.  She is in a financial tough spot and even borrowed some money for lunch last week.  She is interested in section 8 housing.  We will try to get some informations.  She has some info on 211 but has only looked at housing and will relook. She know she needs to sleep better as she is currently sleeping on couch.   We gave Leahna a gas card and started the process to get new shoes as well.     Expected Outcomes  Short: Get in touch with 211 for resources.  Long: Continue to work towards being able to move out of the house.     Interventions  Encouraged to attend Cardiac Rehabilitation for the exercise;Stress management education    Continue Psychosocial Services   Follow up required by counselor    Comments  Quenesha just moved here in May from [place]. She moved here with her son to be closer to her daughter. 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 She does not like to rely on her children to help her, but whenever she tries to drive her anxiety "acts up" and she ends up having her children drive her.       Initial Review   Source of Stress Concerns  Family;Financial;Poor Coping Skills       Vocational Rehabilitation: Provide vocational rehab assistance to qualifying candidates.   Vocational Rehab Evaluation & Intervention: Vocational Rehab - 08/05/18 0959      Vocational Rehab Re-[facility]   Comments  They have all the paperwork.  It takes about 60 days to help.  She has had her orientation with them already. Just waiting for her next step.        Education: Education Goals: Education classes will be provided on a variety of topics geared toward better understanding of heart health and risk factor modification. Participant will state understanding/return demonstration  of topics presented as noted by education test scores.  Learning Barriers/Preferences: Learning Barriers/Preferences - 06/09/18 1318      Learning Barriers/Preferences   Learning Barriers  None    Learning Preferences  Verbal Instruction       Education Topics:  AED/CPR: - Group verbal and written instruction with the use of models to demonstrate the basic use of the AED with the basic ABC's of resuscitation.   Cardiac Rehab from 08/21/2018 in [facility] Cardiac and Pulmonary Rehab  Date  08/05/18  Educator  KS  Instruction Review Code  1- Verbalizes Understanding      General Nutrition Guidelines/Fats and Fiber: -Group instruction provided by verbal, written material, models and posters to present the general guidelines for heart healthy nutrition. Gives an explanation and  review of dietary fats and fiber.   Cardiac Rehab from 08/21/2018 in [facility] Cardiac and Pulmonary Rehab  Date  07/29/18  Educator  LB  Instruction Review Code  1- Verbalizes Understanding      Controlling Sodium/Reading Food Labels: -Group verbal and written material supporting the discussion of sodium use in heart healthy nutrition. Review and explanation with models, verbal and written materials for utilization of the food label.   Cardiac Rehab from 08/21/2018 in [facility] Cardiac and Pulmonary Rehab  Date  08/07/18  Educator  LB  Instruction Review Code  1- Verbalizes Understanding      Exercise Physiology & General Exercise Guidelines: - Group verbal and written instruction with models to review the exercise physiology of the cardiovascular system and associated critical values. Provides general exercise guidelines with specific guidelines to those with heart or lung disease.    Cardiac Rehab from 08/21/2018 in [facility] Cardiac and Pulmonary Rehab  Date  08/14/18  Educator  [place]  Instruction Review Code  1- Verbalizes Understanding      Aerobic Exercise & Resistance Training: - Gives group verbal and written  instruction on the various components of exercise. Focuses on aerobic and resistive training programs and the benefits of this training and how to safely progress through these programs..   Cardiac Rehab from 08/21/2018 in [facility] Cardiac and Pulmonary Rehab  Date  08/21/18  Educator  [facility]  Instruction Review Code  1- Verbalizes Understanding      Flexibility, Balance, Mind/Body Relaxation: Provides group verbal/written instruction on the benefits of flexibility and balance training, including mind/body exercise modes such as yoga, pilates and tai chi.  Demonstration and skill practice provided.   Cardiac Rehab from 08/21/2018 in [facility] Cardiac and Pulmonary Rehab  Date  07/01/18  Educator  AS  Instruction Review Code  1- Verbalizes Understanding      Stress and Anxiety: - Provides group verbal and written instruction about the health risks of elevated stress and causes of high stress.  Discuss the correlation between heart/lung disease and anxiety and treatment options. Review healthy ways to manage with stress and anxiety.   Cardiac Rehab from 08/21/2018 in [facility] Cardiac and Pulmonary Rehab  Date  07/08/18  Educator  [facility]  Instruction Review Code  1- Verbalizes Understanding      Depression: - Provides group verbal and written instruction on the correlation between heart/lung disease and depressed mood, treatment options, and the stigmas associated with seeking treatment.   Cardiac Rehab from 08/21/2018 in [facility] Cardiac and Pulmonary Rehab  Date  08/19/18  Educator  [facility]  Instruction Review Code  1- Verbalizes Understanding      Anatomy & Physiology of the Heart: - Group verbal and written instruction and models provide basic cardiac anatomy and physiology, with the coronary electrical and arterial systems. Review of Valvular disease and Heart Failure   Cardiac Rehab from 08/21/2018 in [facility] Cardiac and Pulmonary Rehab  Date  07/10/18  Educator  CE  Instruction Review Code  1- Verbalizes  Understanding      Cardiac Procedures: - Group verbal and written instruction to review commonly prescribed medications for heart disease. Reviews the medication, class of the drug, and side effects. Includes the steps to properly store meds and maintain the prescription regimen. (beta blockers and nitrates)   Cardiac Rehab from 08/21/2018 in [facility] Cardiac and Pulmonary Rehab  Date  07/31/18  Educator  CE  Instruction Review Code  1- Verbalizes Understanding      Cardiac Medications  I: - Group verbal and written instruction to review commonly prescribed medications for heart disease. Reviews the medication, class of the drug, and side effects. Includes the steps to properly store meds and maintain the prescription regimen.   Cardiac Rehab from 08/21/2018 in [facility] Cardiac and Pulmonary Rehab  Date  07/15/18  Educator  SB  Instruction Review Code  1- Verbalizes Understanding      Cardiac Medications II: -Group verbal and written instruction to review commonly prescribed medications for heart disease. Reviews the medication, class of the drug, and side effects. (all other drug classes)   Cardiac Rehab from 08/21/2018 in [facility] Cardiac and Pulmonary Rehab  Date  07/03/18  Educator  CE  Instruction Review Code  1- Verbalizes Understanding       Go Sex-Intimacy & Heart Disease, Get SMART - Goal Setting: - Group verbal and written instruction through game format to discuss heart disease and the return to sexual intimacy. Provides group verbal and written material to discuss and apply goal setting through the application of the S.M.A.R.T. Method.   Cardiac Rehab from 08/21/2018 in [facility] Cardiac and Pulmonary Rehab  Date  07/31/18  Educator  CE  Instruction Review Code  1- Verbalizes Understanding      Other Matters of the Heart: - Provides group verbal, written materials and models to describe Stable Angina and Peripheral Artery. Includes description of the disease process and treatment  options available to the cardiac patient.   Cardiac Rehab from 08/21/2018 in [facility] Cardiac and Pulmonary Rehab  Date  07/10/18  Educator  CE  Instruction Review Code  1- Verbalizes Understanding      Exercise & Equipment Safety: - Individual verbal instruction and demonstration of equipment use and safety with use of the equipment.   Cardiac Rehab from 08/21/2018 in [facility] Cardiac and Pulmonary Rehab  Date  06/09/18  Educator  [facility]  Instruction Review Code  1- Verbalizes Understanding      Infection Prevention: - Provides verbal and written material to individual with discussion of infection control including proper hand washing and proper equipment cleaning during exercise session.   Cardiac Rehab from 08/21/2018 in [facility] Cardiac and Pulmonary Rehab  Date  06/09/18  Educator  [facility]  Instruction Review Code  1- Verbalizes Understanding      Falls Prevention: - Provides verbal and written material to individual with discussion of falls prevention and safety.   Cardiac Rehab from 08/21/2018 in [facility] Cardiac and Pulmonary Rehab  Date  06/09/18  Educator  [facility]  Instruction Review Code  1- Verbalizes Understanding      Diabetes: - Individual verbal and written instruction to review signs/symptoms of diabetes, desired ranges of glucose level fasting, after meals and with exercise. Acknowledge that pre and post exercise glucose checks will be done for 3 sessions at entry of program.   Know Your Numbers and Risk Factors: -Group verbal and written instruction about important numbers in your health.  Discussion of what are risk factors and how they play a role in the disease process.  Review of Cholesterol, Blood Pressure, Diabetes, and BMI and the role they play in your overall health.   Cardiac Rehab from 08/21/2018 in [facility] Cardiac and Pulmonary Rehab  Date  07/03/18  Educator  CE  Instruction Review Code  1- Verbalizes Understanding      Sleep Hygiene: -Provides group verbal and written  instruction about how sleep can affect your health.  Define sleep hygiene, discuss sleep cycles and impact of sleep  habits. Review good sleep hygiene tips.    Cardiac Rehab from 08/21/2018 in [facility] Cardiac and Pulmonary Rehab  Date  06/19/18  Educator  [facility]  Instruction Review Code  1- Verbalizes Understanding      Other: -Provides group and verbal instruction on various topics (see comments)   Knowledge Questionnaire Score: Knowledge Questionnaire Score - 08/26/18 1040      Knowledge Questionnaire Score   Pre Score  21/26    Post Score  23/26   reviewed with pt today      Core Components/Risk Factors/Patient Goals at Admission: Personal Goals and Risk Factors at Admission - 06/09/18 1306      Core Components/Risk Factors/Patient Goals on Admission    Weight Management  Yes;Weight Loss    Intervention  Weight Management: Develop a combined nutrition and exercise program designed to reach desired caloric intake, while maintaining appropriate intake of nutrient and fiber, sodium and fats, and appropriate energy expenditure required for the weight goal.;Weight Management: Provide education and appropriate resources to help participant work on and attain dietary goals.;Weight Management/Obesity: Establish reasonable short term and long term weight goals.    Admit Weight  202 lb (91.6 kg)    Goal Weight: Short Term  197 lb (89.4 kg)    Goal Weight: Long Term  190 lb (86.2 kg)    Expected Outcomes  Short Term: Continue to assess and modify interventions until short term weight is achieved;Long Term: Adherence to nutrition and physical activity/exercise program aimed toward attainment of established weight goal;Weight Loss: Understanding of general recommendations for a balanced deficit meal plan, which promotes 1-2 lb weight loss per week and includes a negative energy balance of [phone] kcal/d;Understanding recommendations for meals to include 15-35% energy as protein, 25-35% energy from fat,  35-60% energy from carbohydrates, less than 224m of dietary cholesterol, 20-35 gm of total fiber daily;Understanding of distribution of calorie intake throughout the day with the consumption of 4-5 meals/snacks    Hypertension  Yes    Intervention  Provide education on lifestyle modifcations including regular physical activity/exercise, weight management, moderate sodium restriction and increased consumption of fresh fruit, vegetables, and low fat dairy, alcohol moderation, and smoking cessation.;Monitor prescription use compliance.    Expected Outcomes  Short Term: Continued assessment and intervention until BP is < 140/979mHG in hypertensive participants. < 130/8082mG in hypertensive participants with diabetes, heart failure or chronic kidney disease.;Long Term: Maintenance of blood pressure at goal levels.    Lipids  Yes    Intervention  Provide education and support for participant on nutrition & aerobic/resistive exercise along with prescribed medications to achieve LDL <31m47mDL >40mg64m Expected Outcomes  Short Term: Participant states understanding of desired cholesterol values and is compliant with medications prescribed. Participant is following exercise prescription and nutrition guidelines.;Long Term: Cholesterol controlled with medications as prescribed, with individualized exercise RX and with personalized nutrition plan. Value goals: LDL < 31mg,27m > 40 mg.       Core Components/Risk Factors/Patient Goals Review:  Goals and Risk Factor Review    [place] Name 07/08/18 1032 08/05/18 1003           Core Components/Risk Factors/Patient Goals Review   Personal Goals Review  Weight Management/Obesity;Hypertension;Lipids  Weight Management/Obesity;Hypertension;Lipids      Review  Lyrika's weight has been holding steady.  She is still craving sweets.  She is trying to stay away and eat fruits and trail mix instead. She is chewing gum to help.  We talked about trying dark chocolate to  help.  Her blood pressures have getting better.  She checks them about every other day.  Overall she is doing well on her meds.  She see Christell Faith today.  She has stopped her carvediolol after a bad reaction.  She has been having her foot go numb at night.   Candas is doing well with her weight staying steady. She varies between 204-202.  She know she needs to sleep better as she is currently sleeping on couch. She is doing well with her blood pressures and she does check them on her own.  She is doing well with her medications.  She is using medication management clinic already.       Expected Outcomes  Short: Meet with PA today to talk about meds.  Long: Continue to work on weight loss.   Short: Continue to work on weight loss.  Long: Continue to monitor risk factors.          Core Components/Risk Factors/Patient Goals at Discharge (Final Review):  Goals and Risk Factor Review - 08/05/18 1003      Core Components/Risk Factors/Patient Goals Review   Personal Goals Review  Weight Management/Obesity;Hypertension;Lipids    Review  Jelina is doing well with her weight staying steady. She varies between 204-202.  She know she needs to sleep better as she is currently sleeping on couch. She is doing well with her blood pressures and she does check them on her own.  She is doing well with her medications.  She is using medication management clinic already.     Expected Outcomes  Short: Continue to work on weight loss.  Long: Continue to monitor risk factors.        ITP Comments: ITP Comments    Row Name 06/09/18 1305 07/02/18 0851 07/30/18 0610 08/26/18 0927     ITP Comments  Med Review completed. Initial ITP created. Diagnosis can be found in [facility] 6/27  30 day review completed. ITP sent to Dr. Ramonita Lab, covering for Dr. Emily Filbert, Medical Director of Cardiac Rehab. Continue with ITP unless changes are made by physician.  New to program  30 day review completed. ITP sent to Dr. Emily Filbert, Medical  Director of Cardiac Rehab. Continue with ITP unless changes are made by physician  Discharge ITP sent and signed by Dr. Sabra Heck.  Discharge Summary routed to PCP and cardiologist.       Comments: Discharge ITP

## 2018-08-26 NOTE — Progress Notes (Signed)
Daily Session Note  Patient Details  Name: Kelsey Terry MRN: [id number] Date of Birth: 08/14/1961 Referring Provider:     Cardiac Rehab from 06/09/2018 in [facility]  Referring Provider  Arida, Muhammad MD      Encounter Date: 08/26/2018  Check In: Session Check In - 08/26/18 0927      Check-In   Supervising physician immediately available to respond to emergencies  See telemetry face sheet for immediately available ER MD    Location  [facility]    Staff Present  Jessica Hawkins, MA, RCEP, CCRP, Exercise Physiologist;Amanda Sommer, BA, ACSM CEP, Exercise Physiologist;Susanne Bice, RN, BSN, CCRP    Medication changes reported      No    Fall or balance concerns reported     No    Warm-up and Cool-down  Performed on first and last piece of equipment    Resistance Training Performed  Yes    VAD Patient?  No    PAD/SET Patient?  No      Pain Assessment   Currently in Pain?  No/denies          Social History   Tobacco Use  Smoking Status Never Smoker  Smokeless Tobacco Never Used    Goals Met:  Independence with exercise equipment Exercise tolerated well Personal goals reviewed No report of cardiac concerns or symptoms Strength training completed today  Goals Unmet:  Not Applicable  Comments:  Kelsey Terry graduated today from  rehab with 36 sessions completed.  Details of the patient's exercise prescription and what She needs to do in order to continue the prescription and progress were discussed with patient.  Patient was given a copy of prescription and goals.  Patient verbalized understanding.  Kelsey Terry plans to continue to exercise by walking at home.    Dr. Mark Miller is Medical Director for [facility] and [facility]. 

## 2018-08-26 NOTE — Telephone Encounter (Signed)
Patient came by office Wanted to make nurses aware that she has only heard from 1 and it did not turn out She is still waiting to hear from the others

## 2018-08-26 NOTE — Progress Notes (Signed)
Discharge Progress Report  Patient Details  Name: Kelsey Terry MRN: [id number] Date of Birth: 12/05/1960 Referring Provider:     Cardiac Rehab from 06/09/2018 in [facility]  Referring Provider  Kathlyn Sacramento MD       Number of Visits: 36  Reason for Discharge:  Patient reached a stable level of exercise. Patient independent in their exercise. Patient has met program and personal goals.  Smoking History:  Social History   Tobacco Use  Smoking Status Never Smoker  Smokeless Tobacco Never Used    Diagnosis:  NSTEMI (non-ST elevated myocardial infarction) ([place])  Status post coronary artery stent placement  ADL [facility]:   Initial Exercise Prescription: Initial Exercise Prescription - 06/09/18 1200      Date of Initial Exercise RX and Referring Provider   Date  06/09/18    Referring Provider  Kathlyn Sacramento MD      Treadmill   MPH  2    Grade  0.5    Minutes  15    METs  2.69      NuStep   Level  2    SPM  80    Minutes  15    METs  2      Recumbant Elliptical   Level  1    RPM  50    Minutes  15    METs  2      Prescription Details   Frequency (times per week)  2    Duration  Progress to 30 minutes of continuous aerobic without signs/symptoms of physical distress      Intensity   THRR 40-80% of Max Heartrate  99-141    Ratings of Perceived Exertion  11-13    Perceived Dyspnea  0-4      Progression   Progression  Continue to progress workloads to maintain intensity without signs/symptoms of physical distress.      Resistance Training   Training Prescription  Yes    Weight  3 lbs    Reps  10-15       Discharge Exercise Prescription (Final Exercise Prescription Changes): Exercise Prescription Changes - 08/18/18 1600      Response to Exercise   Blood Pressure (Admit)  124/74    Blood Pressure (Exercise)  148/62    Blood Pressure (Exit)  126/60    Heart Rate (Admit)  68 bpm    Heart Rate (Exercise)  116 bpm    Heart Rate  (Exit)  77 bpm    Rating of Perceived Exertion (Exercise)  13    Symptoms  none    Duration  Continue with 30 min of aerobic exercise without signs/symptoms of physical distress.    Intensity  THRR unchanged      Progression   Progression  Continue to progress workloads to maintain intensity without signs/symptoms of physical distress.    Average METs  2.54      Resistance Training   Training Prescription  Yes    Weight  3 lbs    Reps  10-15      Interval Training   Interval Training  No      Treadmill   MPH  2.3    Grade  4    Minutes  15    METs  3.9      NuStep   Level  3    Minutes  15    METs  2      Recumbant Elliptical   Level  4    Minutes  15    METs  1.7      Home Exercise Plan   Plans to continue exercise at  [facility] (comment)    Frequency  Add 3 additional days to program exercise sessions.    Initial Home Exercises Provided  07/01/18       Functional Capacity: 6 Minute Walk    Row Name 06/09/18 1245 08/21/18 1030       6 Minute Walk   Phase  Initial  Discharge    Distance  1070 feet  1270 feet    Distance % Change  -  18.7 %    Distance Feet Change  -  200 ft    Walk Time  6 minutes  6 minutes    # of Rest Breaks  0  0    MPH  2.03  2.41    METS  3.15  3.14    RPE  11  15    VO2 Peak  11  10.97    Symptoms  No  No    Resting HR  57 bpm  79 bpm    Resting BP  132/58  128/60    Resting Oxygen Saturation   98 %  -    Exercise Oxygen Saturation  during 6 min walk  100 %  -    Max Ex. HR  112 bpm  104 bpm    Max Ex. BP  172/74  148/64    2 Minute Post BP  124/72  -       Psychological, QOL, Others - Outcomes: PHQ 2/9: Depression screen [facility] 2/9 08/26/2018 06/09/2018  Decreased Interest 0 0  Down, Depressed, Hopeless 0 0  PHQ - 2 Score 0 0  Altered sleeping 0 0  Tired, decreased energy 0 1  Change in appetite 0 1  Feeling bad or failure about yourself  0 1  Trouble concentrating 0 1  Moving slowly or fidgety/restless 0 0   Suicidal thoughts 0 1  PHQ-9 Score 0 5  Difficult doing work/chores - Somewhat difficult    Quality of Life: Quality of Life - 08/26/18 1040      Quality of Life   Select  Quality of Life      Quality of Life Scores   Health/Function Pre  19.61 %    Health/Function Post  22.21 %    Health/Function % Change  13.26 %    Socioeconomic Pre  6.2 %    Socioeconomic Post  15.6 %    Socioeconomic % Change   151.61 %    Psych/Spiritual Pre  20.57 %    Psych/Spiritual Post  24 %    Psych/Spiritual % Change  16.67 %    Family Pre  16.13 %    Family Post  22.25 %    Family % Change  37.94 %    GLOBAL Pre  17.3 %    GLOBAL Post  21.53 %    GLOBAL % Change  24.45 %       Personal Goals: Goals established at orientation with interventions provided to work toward goal. Personal Goals and Risk Factors at Admission - 06/09/18 1306      Core Components/Risk Factors/Patient Goals on Admission    Weight Management  Yes;Weight Loss    Intervention  Weight Management: Develop a combined nutrition and exercise program designed to reach desired caloric intake, while maintaining appropriate intake of nutrient and fiber, sodium and fats,  and appropriate energy expenditure required for the weight goal.;Weight Management: Provide education and appropriate resources to help participant work on and attain dietary goals.;Weight Management/Obesity: Establish reasonable short term and long term weight goals.    Admit Weight  202 lb (91.6 kg)    Goal Weight: Short Term  197 lb (89.4 kg)    Goal Weight: Long Term  190 lb (86.2 kg)    Expected Outcomes  Short Term: Continue to assess and modify interventions until short term weight is achieved;Long Term: Adherence to nutrition and physical activity/exercise program aimed toward attainment of established weight goal;Weight Loss: Understanding of general recommendations for a balanced deficit meal plan, which promotes 1-2 lb weight loss per week and includes a  negative energy balance of [phone] kcal/d;Understanding recommendations for meals to include 15-35% energy as protein, 25-35% energy from fat, 35-60% energy from carbohydrates, less than 265m of dietary cholesterol, 20-35 gm of total fiber daily;Understanding of distribution of calorie intake throughout the day with the consumption of 4-5 meals/snacks    Hypertension  Yes    Intervention  Provide education on lifestyle modifcations including regular physical activity/exercise, weight management, moderate sodium restriction and increased consumption of fresh fruit, vegetables, and low fat dairy, alcohol moderation, and smoking cessation.;Monitor prescription use compliance.    Expected Outcomes  Short Term: Continued assessment and intervention until BP is < 140/929mHG in hypertensive participants. < 130/806mG in hypertensive participants with diabetes, heart failure or chronic kidney disease.;Long Term: Maintenance of blood pressure at goal levels.    Lipids  Yes    Intervention  Provide education and support for participant on nutrition & aerobic/resistive exercise along with prescribed medications to achieve LDL <11m37mDL >40mg34m Expected Outcomes  Short Term: Participant states understanding of desired cholesterol values and is compliant with medications prescribed. Participant is following exercise prescription and nutrition guidelines.;Long Term: Cholesterol controlled with medications as prescribed, with individualized exercise RX and with personalized nutrition plan. Value goals: LDL < 11mg,80m > 40 mg.        Personal Goals Discharge: Goals and Risk Factor Review    Row Name 07/08/18 1032 08/05/18 1003           Core Components/Risk Factors/Patient Goals Review   Personal Goals Review  Weight Management/Obesity;Hypertension;Lipids  Weight Management/Obesity;Hypertension;Lipids      Review  Gerldine's weight has been holding steady.  She is still craving sweets.  She is trying to  stay away and eat fruits and trail mix instead. She is chewing gum to help.  We talked about trying dark chocolate to help.  Her blood pressures have getting better.  She checks them about every other day.  Overall she is doing well on her meds.  She see Ryan DChristell Faith.  She has stopped her carvediolol after a bad reaction.  She has been having her foot go numb at night.   BrendaLatreaseing well with her weight staying steady. She varies between 204-202.  She know she needs to sleep better as she is currently sleeping on couch. She is doing well with her blood pressures and she does check them on her own.  She is doing well with her medications.  She is using medication management clinic already.       Expected Outcomes  Short: Meet with PA today to talk about meds.  Long: Continue to work on weight loss.   Short: Continue to work on weight loss.  Long: Continue to monitor  risk factors.          Exercise Goals and Review: Exercise Goals    Row Name 06/09/18 1355             Exercise Goals   Increase Physical Activity  Yes       Intervention  Provide advice, education, support and counseling about physical activity/exercise needs.;Develop an individualized exercise prescription for aerobic and resistive training based on initial evaluation findings, risk stratification, comorbidities and participant's personal goals.       Expected Outcomes  Short Term: Attend rehab on a regular basis to increase amount of physical activity.;Long Term: Add in home exercise to make exercise part of routine and to increase amount of physical activity.;Long Term: Exercising regularly at least 3-5 days a week.       Increase Strength and Stamina  Yes       Intervention  Provide advice, education, support and counseling about physical activity/exercise needs.;Develop an individualized exercise prescription for aerobic and resistive training based on initial evaluation findings, risk stratification, comorbidities and  participant's personal goals.       Expected Outcomes  Short Term: Increase workloads from initial exercise prescription for resistance, speed, and METs.;Short Term: Perform resistance training exercises routinely during rehab and add in resistance training at home;Long Term: Improve cardiorespiratory fitness, muscular endurance and strength as measured by increased METs and functional capacity (6MWT)       Able to understand and use rate of perceived exertion (RPE) scale  Yes       Intervention  Provide education and explanation on how to use RPE scale       Expected Outcomes  Short Term: Able to use RPE daily in rehab to express subjective intensity level;Long Term:  Able to use RPE to guide intensity level when exercising independently       Able to understand and use Dyspnea scale  Yes       Intervention  Provide education and explanation on how to use Dyspnea scale       Expected Outcomes  Short Term: Able to use Dyspnea scale daily in rehab to express subjective sense of shortness of breath during exertion;Long Term: Able to use Dyspnea scale to guide intensity level when exercising independently       Knowledge and understanding of Target Heart Rate Range (THRR)  Yes       Intervention  Provide education and explanation of THRR including how the numbers were predicted and where they are located for reference       Expected Outcomes  Short Term: Able to state/look up THRR;Short Term: Able to use daily as guideline for intensity in rehab;Long Term: Able to use THRR to govern intensity when exercising independently       Able to check pulse independently  Yes       Intervention  Provide education and demonstration on how to check pulse in carotid and radial arteries.;Review the importance of being able to check your own pulse for safety during independent exercise       Expected Outcomes  Short Term: Able to explain why pulse checking is important during independent exercise;Long Term: Able to check  pulse independently and accurately       Understanding of Exercise Prescription  Yes       Intervention  Provide education, explanation, and written materials on patient's individual exercise prescription       Expected Outcomes  Short Term: Able to explain program exercise prescription;Long Term:  Able to explain home exercise prescription to exercise independently          Nutrition & Weight - Outcomes: Pre Biometrics - 06/09/18 1249      Pre Biometrics   Height  5' 3.6" (1.615 m)    Weight  202 lb 3.2 oz (91.7 kg)    Waist Circumference  35 inches    Hip Circumference  44 inches    Waist to Hip Ratio  0.8 %    BMI (Calculated)  35.16    Single Leg Stand  1.41 seconds      Post Biometrics - 08/21/18 1031       Post  Biometrics   Height  5' 3.6" (1.615 m)    Weight  204 lb (92.5 kg)    Waist Circumference  35.5 inches    Hip Circumference  46 inches    Waist to Hip Ratio  0.77 %    BMI (Calculated)  35.48    Single Leg Stand  1.5 seconds       Nutrition: Nutrition Therapy & Goals - 07/01/18 1240      Nutrition Therapy   Diet  DASH    Protein (specify units)  10oz    Fiber  25 grams    Whole Grain Foods  3 servings   does not always choose whole grains   Saturated Fats  13 max. grams    Fruits and Vegetables  5 servings/day   8 ideal, recently increased her f&v intake   Sodium  1500 grams      Personal Nutrition Goals   Nutrition Goal  When eating frozen meals, keep sodium below 636m per meal and if you need to increase satiety/fullness you can add an additional serving of vegetables to the meal    Personal Goal #2  Practice being more aware of the sodium content in foods by reading nutrition facts labels. For you, this will be especially important for canned items, deli meats, and frozen meals.     Personal Goal #3  When eating out, look for ways to increase the nutritional value and decrease the sodium and fat content of the meal. For example, choosing steamed  rather than fried entrees at a [employer] ordering vegetables / a plain baked potato as a side instead of mashed potatoes or french fries    Personal Goal #4  Prioritize protein at meal times; choose lean/ non fried varieties most often    Comments  She has been eating mostly fruits and vegetables recently d/t being confused about what foods to eat when following a heart-healthy eating plan. She has been eating frozen meals frequently but tries to choose Healthy Choice or Lean Cuisine brands. She does not consume sugar sweetened beverages often but eats a lot of canned items.      Intervention Plan   Intervention  Prescribe, educate and counsel regarding individualized specific dietary modifications aiming towards targeted core components such as weight, hypertension, lipid management, diabetes, heart failure and other comorbidities.;Nutrition handout(s) given to patient.   low sodium nutrition therapy, guidelines for heart healthy eating handout   Expected Outcomes  Short Term Goal: Understand basic principles of dietary content, such as calories, fat, sodium, cholesterol and nutrients.;Short Term Goal: A plan has been developed with personal nutrition goals set during dietitian appointment.;Long Term Goal: Adherence to prescribed nutrition plan.       Nutrition Discharge: Nutrition Assessments - 08/26/18 1040      MEDFICTS Scores  Pre Score  36    Post Score  18    Score Difference  -18       Education Questionnaire Score: Knowledge Questionnaire Score - 08/26/18 1040      Knowledge Questionnaire Score   Pre Score  21/26    Post Score  23/26   reviewed with pt today      Goals reviewed with patient; copy given to patient.

## 2018-08-26 NOTE — Telephone Encounter (Signed)
°*  STAT* If patient is at the pharmacy, call can be transferred to refill team.   1. Which medications need to be refilled? (please list name of each medication and dose if known) Potassium Chloride 10 MEQ   2. Which pharmacy/location (including street and city if local pharmacy) is medication to be sent to? Medication management   3. Do they need a 30 day or 90 day supply? 90 day

## 2018-08-27 ENCOUNTER — Encounter: Payer: Self-pay | Admitting: *Deleted

## 2018-08-27 DIAGNOSIS — I214 Non-ST elevation (NSTEMI) myocardial infarction: Secondary | ICD-10-CM

## 2018-08-27 DIAGNOSIS — Z955 Presence of coronary angioplasty implant and graft: Secondary | ICD-10-CM

## 2018-08-27 NOTE — Progress Notes (Signed)
Cardiac Individual Treatment Plan  Patient Details  Name: Kelsey Terry MRN: [id number] Date of Birth: 01-31-1961 Referring Provider:     Cardiac Rehab from 06/09/2018 in [facility] Cardiac and Pulmonary Rehab  Referring Provider  Kathlyn Sacramento MD      Initial Encounter Date:    Cardiac Rehab from 06/09/2018 in [facility] Cardiac and Pulmonary Rehab  Date  06/09/18      Visit Diagnosis: NSTEMI (non-ST elevated myocardial infarction) [facility])  Status post coronary artery stent placement  Patient's Home Medications on Admission:  Current Outpatient Medications:  .  aspirin 81 MG EC tablet, Take 1 tablet (81 mg total) by mouth daily., Disp: 90 tablet, Rfl: 0 .  losartan-hydrochlorothiazide (HYZAAR) 100-12.5 MG tablet, Take 1 tablet by mouth daily., Disp: 90 tablet, Rfl: 3 .  potassium chloride (K-DUR) 10 MEQ tablet, Take 2 tablets (20 mEq total) by mouth daily., Disp: 90 tablet, Rfl: 1 .  rosuvastatin (CRESTOR) 40 MG tablet, Take 1 tablet (40 mg total) by mouth daily., Disp: 90 tablet, Rfl: 3 .  ticagrelor (BRILINTA) 90 MG TABS tablet, Take 1 tablet (90 mg total) by mouth 2 (two) times daily., Disp: 32 tablet, Rfl: 0  Past Medical History: Past Medical History:  Diagnosis Date  . Asthma   . CAD in native artery    a. NSTEMI 05/16/18: [place] 05/16/18 - ost-pLAD 95% s/p PCI/DES, mLAD 30%, ostD1 70%, ost-pLCx 30%, mRCA 90%, dRCA 20%; b. staged PCI/DES to [facility] 7/1  . Diastolic dysfunction    a. TTE 05/16/18: EF 55-60%, no RWMA, Gr1DD, trivial AI, calcified mitral annulus, mild MR, mildly dilated LA  . Hypertension   . Lymphedema    LLE  . Pericarditis    a. ~ 2012 in [place], [place] s/p pericardiocentesis     Tobacco Use: Social History   Tobacco Use  Smoking Status Never Smoker  Smokeless Tobacco Never Used    Labs: Recent Review Scientist, physiological    Labs for ITP Cardiac and Pulmonary Rehab Latest Ref Rng & Units 05/16/2018 07/08/2018   Cholestrol 100 - 199 mg/dL 232(H) 162   LDLCALC 0 - 99  mg/dL 168(H) 86   HDL >39 mg/dL 55 49   Trlycerides 0 - 149 mg/dL 43 135   Hemoglobin A1c 4.8 - 5.6 % 5.7(H) -       Exercise Target Goals: Exercise Program Goal: Individual exercise prescription set using results from initial 6 min walk test and THRR while considering  patient's activity barriers and safety.   Exercise Prescription Goal: Initial exercise prescription builds to 30-45 minutes a day of aerobic activity, 2-3 days per week.  Home exercise guidelines will be given to patient during program as part of exercise prescription that the participant will acknowledge.  Activity Barriers & Risk Stratification:   6 Minute Walk: 6 Minute Walk    Row Name 08/21/18 1030         6 Minute Walk   Phase  Discharge     Distance  1270 feet     Distance % Change  18.7 %     Distance Feet Change  200 ft     Walk Time  6 minutes     # of Rest Breaks  0     MPH  2.41     METS  3.14     RPE  15     VO2 Peak  10.97     Symptoms  No     Resting HR  79 bpm  Resting BP  128/60     Max Ex. HR  104 bpm     Max Ex. BP  148/64        Oxygen Initial Assessment:   Oxygen Re-Evaluation:   Oxygen Discharge (Final Oxygen Re-Evaluation):   Initial Exercise Prescription:   Perform Capillary Blood Glucose checks as needed.  Exercise Prescription Changes: Exercise Prescription Changes    Row Name 07/01/18 1000 07/10/18 1100 07/22/18 1600 08/06/18 1000 08/18/18 1600     Response to Exercise   Blood Pressure (Admit)  -  120/72  142/96  128/86  124/74   Blood Pressure (Exercise)  -  140/68  146/84  140/64  148/62   Blood Pressure (Exit)  -  126/72  136/64  126/60  126/60   Heart Rate (Admit)  -  66 bpm  91 bpm  74 bpm  68 bpm   Heart Rate (Exercise)  -  109 bpm  120 bpm  112 bpm  116 bpm   Heart Rate (Exit)  -  65 bpm  75 bpm  72 bpm  77 bpm   Rating of Perceived Exertion (Exercise)  -  _0 Symptoms  -  none  none  none  none   Comments  -  increased incline on  the treadmill  -  -  -   Duration  -  Continue with 30 min of aerobic exercise without signs/symptoms of physical distress.  Continue with 30 min of aerobic exercise without signs/symptoms of physical distress.  Continue with 30 min of aerobic exercise without signs/symptoms of physical distress.  Continue with 30 min of aerobic exercise without signs/symptoms of physical distress.   Intensity  -  THRR unchanged  THRR unchanged  THRR unchanged  THRR unchanged     Progression   Progression  -  Continue to progress workloads to maintain intensity without signs/symptoms of physical distress.  Continue to progress workloads to maintain intensity without signs/symptoms of physical distress.  Continue to progress workloads to maintain intensity without signs/symptoms of physical distress.  Continue to progress workloads to maintain intensity without signs/symptoms of physical distress.   Average METs  -  2.28  2.66  2.23  2.54     Resistance Training   Training Prescription  -  Yes  Yes  Yes  Yes   Weight  -  3 lbs  3 lbs  3 lbs  3 lbs   Reps  -  10-15  10-15  10-15  10-15     Interval Training   Interval Training  -  -  No  No  No     Treadmill   MPH  -  2  2.5  2.5  2.3   Grade  -  1.5  0.5  0.5  4   Minutes  -  _1 METs  -  2.67  3.09  3.09  3.9     NuStep   Level  -  _2 Minutes  -  _3 METs  -  1.9  3._4 Recumbant Elliptical   Level  -  _5 Minutes  -  _6 METs  -  2  1.5  1.6  1.7     Home Exercise Plan   Plans to continue exercise at  [employer] (comment) apartment gym   Page (comment)  Forensic scientist (comment)  Forensic scientist (comment)  Forensic scientist (comment)   Frequency  Add 3 additional days to program exercise sessions.  Add 3 additional days to program exercise sessions.  Add 3 additional days to program exercise sessions.  Add 3 additional days to program exercise sessions.   Add 3 additional days to program exercise sessions.   Initial Home Exercises Provided  07/01/18  07/01/18  07/01/18  07/01/18  07/01/18      Exercise Comments: Exercise Comments    Row Name 08/26/18 3875           Exercise Comments   Gricelda graduated today from  rehab with 36 sessions completed.  Details of the patient's exercise prescription and what She needs to do in order to continue the prescription and progress were discussed with patient.  Patient was given a copy of prescription and goals.  Patient verbalized understanding.  Shyvonne plans to continue to exercise by walking at home.          Exercise Goals and Review:   Exercise Goals Re-Evaluation : Exercise Goals Re-Evaluation    Row Name 07/01/18 1039 07/08/18 1025 07/10/18 1128 07/22/18 1654 08/05/18 0957     Exercise Goal Re-Evaluation   Exercise Goals Review  Understanding of Exercise Prescription;Knowledge and understanding of Target Heart Rate Range (THRR);Able to understand and use rate of perceived exertion (RPE) scale  Increase Physical Activity;Increase Strength and Stamina;Understanding of Exercise Prescription  Increase Physical Activity;Increase Strength and Stamina;Understanding of Exercise Prescription  Increase Physical Activity;Increase Strength and Stamina;Understanding of Exercise Prescription  Increase Physical Activity;Increase Strength and Stamina;Understanding of Exercise Prescription   Comments  Reviewed home exercise with pt today.  Pt plans to work out at her daughters apartment gym for exercise.  Reviewed THR, pulse, RPE, sign and symptoms, NTG use, and when to call 911 or MD.  Also discussed weather considerations and indoor options.  Pt voiced understanding.  Cena is doing well in rehab.  She is using the treadmill and weights and recumbent bike in her daughter's gym at home.  She is starting to get her strength and stamina back.    Diedre is doing well in rehab.  She is using the treadmill and weights  and recumbent bike in her daughter's gym at home.  She is starting to get her strength and stamina back.    Toleen continues to do well in rehab.  She up to 2.5 mph on the treadmill.  We will continue to work on her progress.   Krysia is doing well. She is doing her home exercise by using the treadmill, stepper, weights, and "the runner" at her daughter's gym in complex.  She is usually in there for about an hour total.  She has started to go to [employer] center senior exercise class.    Expected Outcomes  Short: add an additional 3 days of exercise at home. Long: increase overall activity levels   Short: Continue to add in home exercise regularly.  Long: Continue to build strength and stamina.   Short: Continue to add in home exercise regularly.  Long: Continue to build strength and stamina.   Short: Increase workload on XR and NuStep.  Long: Continue to exercise more independently.   Short: Continue to get in weights at home.  Long: Continue to exercise independently.  [place] Name 08/18/18 1638             Exercise Goal Re-Evaluation   Exercise Goals Review  Increase Physical Activity;Increase Strength and Stamina;Understanding of Exercise Prescription       Comments  Ta continues to do well in rehab.  She was able to get some new shoes with help from the [facility] and her feet are much happier!!  She is now doing 2.3 mph and 4% grade on the treadmill.  We will continue to monitor her progression.        Expected Outcomes  Short: Continue to increase workloads.  Long: Continue to go to gym at home.           Discharge Exercise Prescription (Final Exercise Prescription Changes): Exercise Prescription Changes - 08/18/18 1600      Response to Exercise   Blood Pressure (Admit)  124/74    Blood Pressure (Exercise)  148/62    Blood Pressure (Exit)  126/60    Heart Rate (Admit)  68 bpm    Heart Rate (Exercise)  116 bpm    Heart Rate (Exit)  77 bpm    Rating of Perceived  Exertion (Exercise)  13    Symptoms  none    Duration  Continue with 30 min of aerobic exercise without signs/symptoms of physical distress.    Intensity  THRR unchanged      Progression   Progression  Continue to progress workloads to maintain intensity without signs/symptoms of physical distress.    Average METs  2.54      Resistance Training   Training Prescription  Yes    Weight  3 lbs    Reps  10-15      Interval Training   Interval Training  No      Treadmill   MPH  2.3    Grade  4    Minutes  15    METs  3.9      NuStep   Level  3    Minutes  15    METs  2      Recumbant Elliptical   Level  4    Minutes  15    METs  1.7      Home Exercise Plan   Plans to continue exercise at  [employer] (comment)    Frequency  Add 3 additional days to program exercise sessions.    Initial Home Exercises Provided  07/01/18       Nutrition:  Target Goals: Understanding of nutrition guidelines, daily intake of sodium <1530m, cholesterol <2065m calories 30% from fat and 7% or less from saturated fats, daily to have 5 or more servings of fruits and vegetables.  Biometrics:  Post Biometrics - 08/21/18 1031       Post  Biometrics   Height  5' 3.6" (1.615 m)    Weight  204 lb (92.5 kg)    Waist Circumference  35.5 inches    Hip Circumference  46 inches    Waist to Hip Ratio  0.77 %    BMI (Calculated)  35.48    Single Leg Stand  1.5 seconds       Nutrition Therapy Plan and Nutrition Goals: Nutrition Therapy & Goals - 07/01/18 1240      Nutrition Therapy   Diet  DASH    Protein (specify units)  10oz    Fiber  25 grams    Whole Grain Foods  3 servings   does not  always choose whole grains   Saturated Fats  13 max. grams    Fruits and Vegetables  5 servings/day   8 ideal, recently increased her f&v intake   Sodium  1500 grams      Personal Nutrition Goals   Nutrition Goal  When eating frozen meals, keep sodium below 66m per meal and if you need to  increase satiety/fullness you can add an additional serving of vegetables to the meal    Personal Goal #2  Practice being more aware of the sodium content in foods by reading nutrition facts labels. For you, this will be especially important for canned items, deli meats, and frozen meals.     Personal Goal #3  When eating out, look for ways to increase the nutritional value and decrease the sodium and fat content of the meal. For example, choosing steamed rather than fried entrees at a [employer] ordering vegetables / a plain baked potato as a side instead of mashed potatoes or french fries    Personal Goal #4  Prioritize protein at meal times; choose lean/ non fried varieties most often    Comments  She has been eating mostly fruits and vegetables recently d/t being confused about what foods to eat when following a heart-healthy eating plan. She has been eating frozen meals frequently but tries to choose Healthy Choice or Lean Cuisine brands. She does not consume sugar sweetened beverages often but eats a lot of canned items.      Intervention Plan   Intervention  Prescribe, educate and counsel regarding individualized specific dietary modifications aiming towards targeted core components such as weight, hypertension, lipid management, diabetes, heart failure and other comorbidities.;Nutrition handout(s) given to patient.   low sodium nutrition therapy, guidelines for heart healthy eating handout   Expected Outcomes  Short Term Goal: Understand basic principles of dietary content, such as calories, fat, sodium, cholesterol and nutrients.;Short Term Goal: A plan has been developed with personal nutrition goals set during dietitian appointment.;Long Term Goal: Adherence to prescribed nutrition plan.       Nutrition Assessments: Nutrition Assessments - 08/26/18 1040      MEDFICTS Scores   Pre Score  36    Post Score  18    Score Difference  -18       Nutrition Goals  Re-Evaluation: Nutrition Goals Re-Evaluation    RSt. MatthewsName 07/01/18 1309 07/08/18 1030 08/12/18 1135         Goals   Nutrition Goal  When eating frozen meals, keep sodium below 6019mper meal and if you need to increase satiety/fullness you can add an additional serving of vegetables to the meal  lower sodium in meals, more protein, reading food labels  Practice becoming more aware of the sodium content in foods, and choosing lower sodium foods overall. Prioritize protein at meal times and make health-conscious swaps when eating out that help reduce fat and sodium content     Comment  She has been eating frozen meals for lunch and dinner regularly as of late but is unsure of what constitutes as a "healty" choice when reading the label  BrMicholas started to read her food labels more to cut back on sodium .   She is trying to get in more protein.  She has found ready labels to be shocking an discouraging.  It has been eye opening for her.,   She reports to be reading food labels regularly and has become more conscious of the foods she  is puting into her body. She tries to buy low sodium options and has been doing additional research online to expand her knowledge on heart-healthy eating. She has also been puting more emphasis on eating protein sources and includes items like protein bars, trail mix, beans and dairy in addition to some meats     Expected Outcome  She will choose meals with less than 674m sodium, equal to or greater than 15g protein, zero trans fat and minimal saturated fat  Short: Reading labels to reduce sodium and increase her protein. Long: Continue to aim for healthier options.   She will read food labels consistently moving forward, keeping heart-healthy diet parameters for factors like sodium and types of fat in mind. She will consume a variety of protien sources daily       Personal Goal #2 Re-Evaluation   Personal Goal #2  Practice being more aware of the sodium content in foods by  reading nutrition facts labels. For you, this will be especially important for canned items, deli meat and frozen meals  -  -       Personal Goal #3 Re-Evaluation   Personal Goal #3  When eating out, look for ways to increase the nutritional value and decrease the sodium and fat content of the meal. For example, choosing steamed rather than fried entrees at [employer]or ordering vegetables/ a plain baked potato as a side instead of mashed potaotes or french fries  -  -       Personal Goal #4 Re-Evaluation   Personal Goal #4  Prioritize protein at meal times; choose lean / non fried varieties most often  -  -        Nutrition Goals Discharge (Final Nutrition Goals Re-Evaluation): Nutrition Goals Re-Evaluation - 08/12/18 1135      Goals   Nutrition Goal  Practice becoming more aware of the sodium content in foods, and choosing lower sodium foods overall. Prioritize protein at meal times and make health-conscious swaps when eating out that help reduce fat and sodium content    Comment  She reports to be reading food labels regularly and has become more conscious of the foods she is puting into her body. She tries to buy low sodium options and has been doing additional research online to expand her knowledge on heart-healthy eating. She has also been puting more emphasis on eating protein sources and includes items like protein bars, trail mix, beans and dairy in addition to some meats    Expected Outcome  She will read food labels consistently moving forward, keeping heart-healthy diet parameters for factors like sodium and types of fat in mind. She will consume a variety of protien sources daily       Psychosocial: Target Goals: Acknowledge presence or absence of significant depression and/or stress, maximize coping skills, provide positive support system. Participant is able to verbalize types and ability to use techniques and skills needed for reducing stress and depression.    Initial Review & Psychosocial Screening:   Quality of Life Scores:  Quality of Life - 08/26/18 1040      Quality of Life   Select  Quality of Life      Quality of Life Scores   Health/Function Pre  19.61 %    Health/Function Post  22.21 %    Health/Function % Change  13.26 %    Socioeconomic Pre  6.2 %    Socioeconomic Post  15.6 %    Socioeconomic % Change  151.61 %    Psych/Spiritual Pre  20.57 %    Psych/Spiritual Post  24 %    Psych/Spiritual % Change  16.67 %    Family Pre  16.13 %    Family Post  22.25 %    Family % Change  37.94 %    GLOBAL Pre  17.3 %    GLOBAL Post  21.53 %    GLOBAL % Change  24.45 %      Scores of 19 and below usually indicate a poorer quality of life in these areas.  A difference of  2-3 points is a clinically meaningful difference.  A difference of 2-3 points in the total score of the Quality of Life Index has been associated with significant improvement in overall quality of life, self-image, physical symptoms, and general health in studies assessing change in quality of life.  PHQ-9: Recent Review Flowsheet Data    Depression screen [facility] 2/9 08/26/2018 06/09/2018   Decreased Interest 0 0   Down, Depressed, Hopeless 0 0   PHQ - 2 Score 0 0   Altered sleeping 0 0   Tired, decreased energy 0 1   Change in appetite 0 1   Feeling bad or failure about yourself  0 1   Trouble concentrating 0 1   Moving slowly or fidgety/restless 0 0   Suicidal thoughts 0 1    PHQ-9 Score 0 5   Difficult doing work/chores - Somewhat difficult     Interpretation of Total Score  Total Score Depression Severity:  1-4 = Minimal depression, 5-9 = Mild depression, 10-14 = Moderate depression, 15-19 = Moderately severe depression, 20-27 = Severe depression   Psychosocial Evaluation and Intervention:   Psychosocial Re-Evaluation: Psychosocial Re-Evaluation    Row Name 07/08/18 1026 08/05/18 0958           Psychosocial Re-Evaluation   Current issues with   Current Stress Concerns  Current Stress Concerns;Current Sleep Concerns      Comments  Gordana continues to do well in rehab.  She is still not comfortable at home.  She has not made progress towards moving out. She needs a job to be able to get out and take care of her needs.  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  I gave her vocational rehab paperwork .  Lorin Glass is doing okay. She is not used to not working and relying on others. She is working with vocational rehab.  Still doesn't not like living with her daughter.  She needs a job before she is able to move out.  She is in a financial tough spot and even borrowed some money for lunch last week.  She is interested in section 8 housing.  We will try to get some informations.  She has some info on 211 but has only looked at housing and will relook. She know she needs to sleep better as she is currently sleeping on couch.   We gave Genee a gas card and started the process to get new shoes as well.       Expected Outcomes  Short: Start vocational rehab.  Long: Continue to stay postive.   Short: Get in touch with 211 for resources.  Long: Continue to work towards being able to move out of the house.       Interventions  -  Encouraged to attend Cardiac Rehabilitation for the exercise;Stress management education      Continue Psychosocial Services   -  Follow up required by counselor      Comments  -  Ronalda just moved here in May from [place]. She moved here with her son to be closer to her daughter. 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 She does not like to rely on her children to help her, but whenever she tries to drive her anxiety "acts up" and she ends up having her children drive her.         Initial Review   Source of Stress Concerns  -  Family;Financial;Poor Coping Skills         Psychosocial Discharge (Final Psychosocial Re-Evaluation): Psychosocial Re-Evaluation - 08/05/18 0958      Psychosocial  Re-Evaluation   Current issues with  Current Stress Concerns;Current Sleep Concerns    Comments  Lorin Glass is doing okay. She is not used to not working and relying on others. She is working with vocational rehab.  Still doesn't not like living with her daughter.  She needs a job before she is able to move out.  She is in a financial tough spot and even borrowed some money for lunch last week.  She is interested in section 8 housing.  We will try to get some informations.  She has some info on 211 but has only looked at housing and will relook. She know she needs to sleep better as she is currently sleeping on couch.   We gave Mirza a gas card and started the process to get new shoes as well.     Expected Outcomes  Short: Get in touch with 211 for resources.  Long: Continue to work towards being able to move out of the house.     Interventions  Encouraged to attend Cardiac Rehabilitation for the exercise;Stress management education    Continue Psychosocial Services   Follow up required by counselor    Comments  Sonoma just moved here in May from [place]. She moved here with her son to be closer to her daughter. 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 She does not like to rely on her children to help her, but whenever she tries to drive her anxiety "acts up" and she ends up having her children drive her.       Initial Review   Source of Stress Concerns  Family;Financial;Poor Coping Skills       Vocational Rehabilitation: Provide vocational rehab assistance to qualifying candidates.   Vocational Rehab Evaluation & Intervention: Vocational Rehab - 08/05/18 0959      Vocational Rehab Re-Evaulation   Comments  They have all the paperwork.  It takes about 60 days to help.  She has had her orientation with them already. Just waiting for her next step.        Education: Education Goals: Education classes will be provided on a variety of topics geared toward  better understanding of heart health and risk factor modification. Participant will state understanding/return demonstration of topics presented as noted by education test scores.  Learning Barriers/Preferences:   Education Topics:  AED/CPR: - Group verbal and written instruction with the use of models to demonstrate the basic use of the AED with the basic ABC's of resuscitation.   Cardiac Rehab from 08/21/2018 in [facility] Cardiac and Pulmonary Rehab  Date  08/05/18  Educator  KS  Instruction Review Code  1- Verbalizes Understanding  General Nutrition Guidelines/Fats and Fiber: -Group instruction provided by verbal, written material, models and posters to present the general guidelines for heart healthy nutrition. Gives an explanation and review of dietary fats and fiber.   Cardiac Rehab from 08/21/2018 in [facility] Cardiac and Pulmonary Rehab  Date  07/29/18  Educator  LB  Instruction Review Code  1- Verbalizes Understanding      Controlling Sodium/Reading Food Labels: -Group verbal and written material supporting the discussion of sodium use in heart healthy nutrition. Review and explanation with models, verbal and written materials for utilization of the food label.   Cardiac Rehab from 08/21/2018 in [facility] Cardiac and Pulmonary Rehab  Date  08/07/18  Educator  LB  Instruction Review Code  1- Verbalizes Understanding      Exercise Physiology & General Exercise Guidelines: - Group verbal and written instruction with models to review the exercise physiology of the cardiovascular system and associated critical values. Provides general exercise guidelines with specific guidelines to those with heart or lung disease.    Cardiac Rehab from 08/21/2018 in [facility] Cardiac and Pulmonary Rehab  Date  08/14/18  Educator  [facility]  Instruction Review Code  1- Verbalizes Understanding      Aerobic Exercise & Resistance Training: - Gives group verbal and written instruction on the various components of  exercise. Focuses on aerobic and resistive training programs and the benefits of this training and how to safely progress through these programs..   Cardiac Rehab from 08/21/2018 in [facility] Cardiac and Pulmonary Rehab  Date  08/21/18  Educator  [facility]  Instruction Review Code  1- Verbalizes Understanding      Flexibility, Balance, Mind/Body Relaxation: Provides group verbal/written instruction on the benefits of flexibility and balance training, including mind/body exercise modes such as yoga, pilates and tai chi.  Demonstration and skill practice provided.   Cardiac Rehab from 08/21/2018 in [facility] Cardiac and Pulmonary Rehab  Date  07/01/18  Educator  AS  Instruction Review Code  1- Verbalizes Understanding      Stress and Anxiety: - Provides group verbal and written instruction about the health risks of elevated stress and causes of high stress.  Discuss the correlation between heart/lung disease and anxiety and treatment options. Review healthy ways to manage with stress and anxiety.   Cardiac Rehab from 08/21/2018 in [facility] Cardiac and Pulmonary Rehab  Date  07/08/18  Educator  [facility]  Instruction Review Code  1- Verbalizes Understanding      Depression: - Provides group verbal and written instruction on the correlation between heart/lung disease and depressed mood, treatment options, and the stigmas associated with seeking treatment.   Cardiac Rehab from 08/21/2018 in [facility] Cardiac and Pulmonary Rehab  Date  08/19/18  Educator  [facility]  Instruction Review Code  1- Verbalizes Understanding      Anatomy & Physiology of the Heart: - Group verbal and written instruction and models provide basic cardiac anatomy and physiology, with the coronary electrical and arterial systems. Review of Valvular disease and Heart Failure   Cardiac Rehab from 08/21/2018 in [facility] Cardiac and Pulmonary Rehab  Date  07/10/18  Educator  CE  Instruction Review Code  1- Verbalizes Understanding      Cardiac Procedures: -  Group verbal and written instruction to review commonly prescribed medications for heart disease. Reviews the medication, class of the drug, and side effects. Includes the steps to properly store meds and maintain the prescription regimen. (beta blockers and nitrates)   Cardiac Rehab from 08/21/2018 in  [place] Cardiac and Pulmonary Rehab  Date  07/31/18  Educator  CE  Instruction Review Code  1- Verbalizes Understanding      Cardiac Medications I: - Group verbal and written instruction to review commonly prescribed medications for heart disease. Reviews the medication, class of the drug, and side effects. Includes the steps to properly store meds and maintain the prescription regimen.   Cardiac Rehab from 08/21/2018 in [facility] Cardiac and Pulmonary Rehab  Date  07/15/18  Educator  SB  Instruction Review Code  1- Verbalizes Understanding      Cardiac Medications II: -Group verbal and written instruction to review commonly prescribed medications for heart disease. Reviews the medication, class of the drug, and side effects. (all other drug classes)   Cardiac Rehab from 08/21/2018 in [facility] Cardiac and Pulmonary Rehab  Date  07/03/18  Educator  CE  Instruction Review Code  1- Verbalizes Understanding       Go Sex-Intimacy & Heart Disease, Get SMART - Goal Setting: - Group verbal and written instruction through game format to discuss heart disease and the return to sexual intimacy. Provides group verbal and written material to discuss and apply goal setting through the application of the S.M.A.R.T. Method.   Cardiac Rehab from 08/21/2018 in [facility] Cardiac and Pulmonary Rehab  Date  07/31/18  Educator  CE  Instruction Review Code  1- Verbalizes Understanding      Other Matters of the Heart: - Provides group verbal, written materials and models to describe Stable Angina and Peripheral Artery. Includes description of the disease process and treatment options available to the cardiac patient.    Cardiac Rehab from 08/21/2018 in [facility] Cardiac and Pulmonary Rehab  Date  07/10/18  Educator  CE  Instruction Review Code  1- Verbalizes Understanding      Exercise & Equipment Safety: - Individual verbal instruction and demonstration of equipment use and safety with use of the equipment.   Cardiac Rehab from 08/21/2018 in [facility] Cardiac and Pulmonary Rehab  Date  06/09/18  Educator  [facility]  Instruction Review Code  1- Verbalizes Understanding      Infection Prevention: - Provides verbal and written material to individual with discussion of infection control including proper hand washing and proper equipment cleaning during exercise session.   Cardiac Rehab from 08/21/2018 in [facility] Cardiac and Pulmonary Rehab  Date  06/09/18  Educator  [place]  Instruction Review Code  1- Verbalizes Understanding      Falls Prevention: - Provides verbal and written material to individual with discussion of falls prevention and safety.   Cardiac Rehab from 08/21/2018 in [facility] Cardiac and Pulmonary Rehab  Date  06/09/18  Educator  [facility]  Instruction Review Code  1- Verbalizes Understanding      Diabetes: - Individual verbal and written instruction to review signs/symptoms of diabetes, desired ranges of glucose level fasting, after meals and with exercise. Acknowledge that pre and post exercise glucose checks will be done for 3 sessions at entry of program.   Know Your Numbers and Risk Factors: -Group verbal and written instruction about important numbers in your health.  Discussion of what are risk factors and how they play a role in the disease process.  Review of Cholesterol, Blood Pressure, Diabetes, and BMI and the role they play in your overall health.   Cardiac Rehab from 08/21/2018 in [facility] Cardiac and Pulmonary Rehab  Date  07/03/18  Educator  CE  Instruction Review Code  1- Verbalizes Understanding  Sleep Hygiene: -Provides group verbal and written instruction about how sleep can affect your  health.  Define sleep hygiene, discuss sleep cycles and impact of sleep habits. Review good sleep hygiene tips.    Cardiac Rehab from 08/21/2018 in [facility] Cardiac and Pulmonary Rehab  Date  06/19/18  Educator  [facility]  Instruction Review Code  1- Verbalizes Understanding      Other: -Provides group and verbal instruction on various topics (see comments)   Knowledge Questionnaire Score: Knowledge Questionnaire Score - 08/26/18 1040      Knowledge Questionnaire Score   Pre Score  21/26    Post Score  23/26   reviewed with pt today      Core Components/Risk Factors/Patient Goals at Admission:   Core Components/Risk Factors/Patient Goals Review:  Goals and Risk Factor Review    Row Name 07/08/18 1032 08/05/18 1003           Core Components/Risk Factors/Patient Goals Review   Personal Goals Review  Weight Management/Obesity;Hypertension;Lipids  Weight Management/Obesity;Hypertension;Lipids      Review  Kiandra's weight has been holding steady.  She is still craving sweets.  She is trying to stay away and eat fruits and trail mix instead. She is chewing gum to help.  We talked about trying dark chocolate to help.  Her blood pressures have getting better.  She checks them about every other day.  Overall she is doing well on her meds.  She see Christell Faith today.  She has stopped her carvediolol after a bad reaction.  She has been having her foot go numb at night.   Kortne is doing well with her weight staying steady. She varies between 204-202.  She know she needs to sleep better as she is currently sleeping on couch. She is doing well with her blood pressures and she does check them on her own.  She is doing well with her medications.  She is using medication management clinic already.       Expected Outcomes  Short: Meet with PA today to talk about meds.  Long: Continue to work on weight loss.   Short: Continue to work on weight loss.  Long: Continue to monitor risk factors.          Core  Components/Risk Factors/Patient Goals at Discharge (Final Review):  Goals and Risk Factor Review - 08/05/18 1003      Core Components/Risk Factors/Patient Goals Review   Personal Goals Review  Weight Management/Obesity;Hypertension;Lipids    Review  Desirai is doing well with her weight staying steady. She varies between 204-202.  She know she needs to sleep better as she is currently sleeping on couch. She is doing well with her blood pressures and she does check them on her own.  She is doing well with her medications.  She is using medication management clinic already.     Expected Outcomes  Short: Continue to work on weight loss.  Long: Continue to monitor risk factors.        ITP Comments: ITP Comments    Row Name 07/02/18 0851 07/30/18 0610 08/26/18 0927 08/27/18 0638     ITP Comments  30 day review completed. ITP sent to Dr. Ramonita Lab, covering for Dr. Emily Filbert, Medical Director of Cardiac Rehab. Continue with ITP unless changes are made by physician.  New to program  30 day review completed. ITP sent to Dr. Emily Filbert, Medical Director of Cardiac Rehab. Continue with ITP unless changes are made by physician  Discharge  ITP sent and signed by Dr. Sabra Heck.  Discharge Summary routed to PCP and cardiologist.  30 day review.  Continue with ITP unless directed changes per Medical Director review. discharged       Comments:
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
## 2018-08-27 NOTE — Telephone Encounter (Signed)
[place] and left message to call back to discuss patient.  [place] and s/w [place]. She was very helpful. Provided her with patient's story and needs. She said there may be ways we can help the patient. She will contact me back after she speaks with patient and determines appropriate plan.

## 2018-09-01 NOTE — Telephone Encounter (Signed)
[place] from [employer] called. She said they may be able to help patient with monetary needs. The best scenario would be once the patient has a job they could possible help with deposits, bills or other living expenses. She would need a W-9 filled out and invoice of the expense. Then they would contact the place needing the money directly.  She also gave me the [facility] as places patient could receive help with job placement.

## 2018-09-04 ENCOUNTER — Ambulatory Visit: Payer: Self-pay | Admitting: Cardiovascular Disease

## 2018-09-04 ENCOUNTER — Encounter

## 2018-09-04 NOTE — Telephone Encounter (Signed)
Attempted to call [employer]. Left message to call back to discuss ways to help patient.  Called patient and gave her [employer] number and [employer] number again. Let her know to reach out to these as well as [facility]. She states she's already reached out to [employer]. Advised her to call us when she gets a job and we can initiate some monetary assistance from the [employer] if she qualifies.

## 2018-09-09 ENCOUNTER — Ambulatory Visit: Payer: Self-pay | Admitting: Cardiovascular Disease

## 2018-09-10 NOTE — Telephone Encounter (Signed)
[facility] today. They can help the patient and shared she had already contacted them and has an appointment to meet with them tomorrow. I was glad to hear that. Nothing further needed at this time.

## 2018-09-12 ENCOUNTER — Other Ambulatory Visit: Payer: Self-pay | Admitting: Physician Assistant

## 2018-09-17 ENCOUNTER — Telehealth: Payer: Self-pay | Admitting: Cardiovascular Disease

## 2018-09-17 NOTE — Telephone Encounter (Signed)
Recieved request from [facility] [employer]HS Scanned to release Forwarded to [employer] for processing

## 2018-09-25 ENCOUNTER — Telehealth: Payer: Self-pay | Admitting: *Deleted

## 2018-09-25 NOTE — Telephone Encounter (Signed)
[employer] clearance faxed to [phone]

## 2018-11-20 ENCOUNTER — Other Ambulatory Visit: Payer: Self-pay

## 2018-11-20 ENCOUNTER — Encounter: Payer: Self-pay | Admitting: Gerontology

## 2018-11-20 ENCOUNTER — Ambulatory Visit: Payer: Self-pay | Admitting: Gerontology

## 2018-11-20 VITALS — BP 143/92 | HR 90 | Ht 63.0 in | Wt 210.1 lb

## 2018-11-20 DIAGNOSIS — Z0000 Encounter for general adult medical examination without abnormal findings: Secondary | ICD-10-CM

## 2018-11-20 DIAGNOSIS — Z7689 Persons encountering health services in other specified circumstances: Secondary | ICD-10-CM

## 2018-11-20 DIAGNOSIS — I10 Essential (primary) hypertension: Secondary | ICD-10-CM

## 2018-11-20 DIAGNOSIS — I214 Non-ST elevation (NSTEMI) myocardial infarction: Secondary | ICD-10-CM

## 2018-11-20 NOTE — Patient Instructions (Signed)
DASH Eating Plan  DASH stands for "Dietary Approaches to Stop Hypertension." The DASH eating plan is a healthy eating plan that has been shown to reduce high blood pressure (hypertension). It may also reduce your risk for type 2 diabetes, heart disease, and stroke. The DASH eating plan may also help with weight loss.  What are tips for following this plan?    General guidelines   Avoid eating more than 2,300 mg (milligrams) of salt (sodium) a day. If you have hypertension, you may need to reduce your sodium intake to 1,500 mg a day.   Limit alcohol intake to no more than 1 drink a day for nonpregnant women and 2 drinks a day for men. One drink equals 12 oz of beer, 5 oz of wine, or 1 oz of hard liquor.   Work with your health care provider to maintain a healthy body weight or to lose weight. Ask what an ideal weight is for you.   Get at least 30 minutes of exercise that causes your heart to beat faster (aerobic exercise) most days of the week. Activities may include walking, swimming, or biking.   Work with your health care provider or diet and nutrition specialist (dietitian) to adjust your eating plan to your individual calorie needs.  Reading food labels     Check food labels for the amount of sodium per serving. Choose foods with less than 5 percent of the Daily Value of sodium. Generally, foods with less than 300 mg of sodium per serving fit into this eating plan.   To find whole grains, look for the word "whole" as the first word in the ingredient list.  Shopping   Buy products labeled as "low-sodium" or "no salt added."   Buy fresh foods. Avoid canned foods and premade or frozen meals.  Cooking   Avoid adding salt when cooking. Use salt-free seasonings or herbs instead of table salt or sea salt. Check with your health care provider or pharmacist before using salt substitutes.   Do not fry foods. Cook foods using healthy methods such as baking, boiling, grilling, and broiling instead.   Cook with  heart-healthy oils, such as olive, canola, soybean, or sunflower oil.  Meal planning   Eat a balanced diet that includes:  ? 5 or more servings of fruits and vegetables each day. At each meal, try to fill half of your plate with fruits and vegetables.  ? Up to 6-8 servings of whole grains each day.  ? Less than 6 oz of lean meat, poultry, or fish each day. A 3-oz serving of meat is about the same size as a deck of cards. One egg equals 1 oz.  ? 2 servings of low-fat dairy each day.  ? A serving of nuts, seeds, or beans 5 times each week.  ? Heart-healthy fats. Healthy fats called Omega-3 fatty acids are found in foods such as flaxseeds and coldwater fish, like sardines, salmon, and mackerel.   Limit how much you eat of the following:  ? Canned or prepackaged foods.  ? Food that is high in trans fat, such as fried foods.  ? Food that is high in saturated fat, such as fatty meat.  ? Sweets, desserts, sugary drinks, and other foods with added sugar.  ? Full-fat dairy products.   Do not salt foods before eating.   Try to eat at least 2 vegetarian meals each week.   Eat more home-cooked food and less restaurant, buffet, and fast food.     When eating at a restaurant, ask that your food be prepared with less salt or no salt, if possible.  What foods are recommended?  The items listed may not be a complete list. Talk with your dietitian about what dietary choices are best for you.  Grains  Whole-grain or whole-wheat bread. Whole-grain or whole-wheat pasta. Brown rice. Oatmeal. Quinoa. Bulgur. Whole-grain and low-sodium cereals. Pita bread. Low-fat, low-sodium crackers. Whole-wheat flour tortillas.  Vegetables  Fresh or frozen vegetables (raw, steamed, roasted, or grilled). Low-sodium or reduced-sodium tomato and vegetable juice. Low-sodium or reduced-sodium tomato sauce and tomato paste. Low-sodium or reduced-sodium canned vegetables.  Fruits  All fresh, dried, or frozen fruit. Canned fruit in natural juice (without  added sugar).  Meat and other protein foods  Skinless chicken or turkey. Ground chicken or turkey. Pork with fat trimmed off. Fish and seafood. Egg whites. Dried beans, peas, or lentils. Unsalted nuts, nut butters, and seeds. Unsalted canned beans. Lean cuts of beef with fat trimmed off. Low-sodium, lean deli meat.  Dairy  Low-fat (1%) or fat-free (skim) milk. Fat-free, low-fat, or reduced-fat cheeses. Nonfat, low-sodium ricotta or cottage cheese. Low-fat or nonfat yogurt. Low-fat, low-sodium cheese.  Fats and oils  Soft margarine without trans fats. Vegetable oil. Low-fat, reduced-fat, or light mayonnaise and salad dressings (reduced-sodium). Canola, safflower, olive, soybean, and sunflower oils. Avocado.  Seasoning and other foods  Herbs. Spices. Seasoning mixes without salt. Unsalted popcorn and pretzels. Fat-free sweets.  What foods are not recommended?  The items listed may not be a complete list. Talk with your dietitian about what dietary choices are best for you.  Grains  Baked goods made with fat, such as croissants, muffins, or some breads. Dry pasta or rice meal packs.  Vegetables  Creamed or fried vegetables. Vegetables in a cheese sauce. Regular canned vegetables (not low-sodium or reduced-sodium). Regular canned tomato sauce and paste (not low-sodium or reduced-sodium). Regular tomato and vegetable juice (not low-sodium or reduced-sodium). Pickles. Olives.  Fruits  Canned fruit in a light or heavy syrup. Fried fruit. Fruit in cream or butter sauce.  Meat and other protein foods  Fatty cuts of meat. Ribs. Fried meat. Bacon. Sausage. Bologna and other processed lunch meats. Salami. Fatback. Hotdogs. Bratwurst. Salted nuts and seeds. Canned beans with added salt. Canned or smoked fish. Whole eggs or egg yolks. Chicken or turkey with skin.  Dairy  Whole or 2% milk, cream, and half-and-half. Whole or full-fat cream cheese. Whole-fat or sweetened yogurt. Full-fat cheese. Nondairy creamers. Whipped toppings.  Processed cheese and cheese spreads.  Fats and oils  Butter. Stick margarine. Lard. Shortening. Ghee. Bacon fat. Tropical oils, such as coconut, palm kernel, or palm oil.  Seasoning and other foods  Salted popcorn and pretzels. Onion salt, garlic salt, seasoned salt, table salt, and sea salt. Worcestershire sauce. Tartar sauce. Barbecue sauce. Teriyaki sauce. Soy sauce, including reduced-sodium. Steak sauce. Canned and packaged gravies. Fish sauce. Oyster sauce. Cocktail sauce. Horseradish that you find on the shelf. Ketchup. Mustard. Meat flavorings and tenderizers. Bouillon cubes. Hot sauce and Tabasco sauce. Premade or packaged marinades. Premade or packaged taco seasonings. Relishes. Regular salad dressings.  Where to find more information:   National Heart, Lung, and Blood Institute: www.nhlbi.nih.gov   American Heart Association: www.heart.org  Summary   The DASH eating plan is a healthy eating plan that has been shown to reduce high blood pressure (hypertension). It may also reduce your risk for type 2 diabetes, heart disease, and stroke.   With the   DASH eating plan, you should limit salt (sodium) intake to 2,300 mg a day. If you have hypertension, you may need to reduce your sodium intake to 1,500 mg a day.   When on the DASH eating plan, aim to eat more fresh fruits and vegetables, whole grains, lean proteins, low-fat dairy, and heart-healthy fats.   Work with your health care provider or diet and nutrition specialist (dietitian) to adjust your eating plan to your individual calorie needs.  This information is not intended to replace advice given to you by your health care provider. Make sure you discuss any questions you have with your health care provider.  Document Released: 10/25/2011 Document Revised: 10/29/2016 Document Reviewed: 10/29/2016  Elsevier Interactive Patient Education  2019 [employer]

## 2018-11-20 NOTE — Progress Notes (Signed)
Patient: Kelsey Terry Female    DOB: 1961-08-06   58 y.o.   MRN: [id number] Visit Date: 11/20/2018  Today's Provider: Langston Reusing, NP   Chief Complaint  Patient presents with  . New Patient (Initial Visit)    establish care, needs mammogram and eye exam, colonoscopy   Subjective:    HPI Ms. Criscuolo is a 58 y/o female that presents for initial visit and discuss about healthcare maintenance . She reported relocated from [place] early 2019 and had an NSTEMI  with 2 stents placed in June 2019, and she is taking Brilinta 90 mg BID. She equally reports having a history of hypertension and takes Hyzaar 100-12.5 mg daily, and crestor 40 mg daily for hyperlipidemia.   She denies headache, chest pain, palpitation,myopathy, shortness of breath, fever, chills. Otherwise she reports doing well.    Allergies  Allergen Reactions  . Amoxicillin Itching and Other (See Comments)    Reaction: bump in vaginal region Has patient had a PCN reaction causing immediate rash, facial/tongue/throat swelling, SOB or lightheadedness with hypotension: No Has patient had a PCN reaction causing severe rash involving mucus membranes or skin necrosis: No Has patient had a PCN reaction that required hospitalization: No Has patient had a PCN reaction occurring within the last 10 years: No If all of the above answers are "NO", then may proceed with Cephalosporin use.    Previous Medications   ASPIRIN EC 81 MG TABLET    TAKE ONE TABLET BY MOUTH EVERY DAY   LOSARTAN-HYDROCHLOROTHIAZIDE (HYZAAR) 100-12.5 MG TABLET    Take 1 tablet by mouth daily.   POTASSIUM CHLORIDE (K-DUR) 10 MEQ TABLET    Take 2 tablets (20 mEq total) by mouth daily.   ROSUVASTATIN (CRESTOR) 40 MG TABLET    Take 1 tablet (40 mg total) by mouth daily.   TICAGRELOR (BRILINTA) 90 MG TABS TABLET    Take 1 tablet (90 mg total) by mouth 2 (two) times daily.    Review of Systems  Constitutional: Positive for fatigue (since started on Brilinta).  HENT:  Negative.   Eyes: Negative.   Respiratory: Negative.   Cardiovascular: Negative.   Gastrointestinal: Negative.   Endocrine: Negative.   Genitourinary: Negative.   Musculoskeletal: Negative.   Skin: Negative.   Neurological: Negative.   Hematological: Bruises/bleeds easily (on Brilinta).  Psychiatric/Behavioral: Negative.     Social History   Tobacco Use  . Smoking status: Never Smoker  . Smokeless tobacco: Never Used  Substance Use Topics  . Alcohol use: Not Currently   Objective:   BP (!) 143/92 (BP Location: Left Arm, Patient Position: Standing)   Pulse 90   Ht 5' 3"  (1.6 m)   Wt 210 lb 1.6 oz (95.3 kg)   SpO2 99%   BMI 37.22 kg/m   Physical Exam Constitutional:      Appearance: Normal appearance.  HENT:     Head: Normocephalic and atraumatic.     Mouth/Throat:     Mouth: Mucous membranes are moist.  Eyes:     Extraocular Movements: Extraocular movements intact.     Pupils: Pupils are equal, round, and reactive to light.  Neck:     Musculoskeletal: Normal range of motion.  Cardiovascular:     Rate and Rhythm: Normal rate and regular rhythm.     Pulses: Normal pulses.     Heart sounds: Normal heart sounds.  Pulmonary:     Effort: Pulmonary effort is normal.     Breath sounds: Normal breath sounds.  Abdominal:     General: Abdomen is flat. Bowel sounds are normal.     Palpations: Abdomen is soft.  Musculoskeletal: Normal range of motion.        General: Deformity (LLE is bigger than right LLE, no edema) present. No swelling (swelling LLE).     Left lower leg: No edema.  Skin:    General: Skin is warm and dry.  Neurological:     General: No focal deficit present.     Mental Status: She is alert and oriented to person, place, and time.  Psychiatric:        Mood and Affect: Mood normal.        Behavior: Behavior normal.        Thought Content: Thought content normal.        Judgment: Judgment normal.         Assessment & Plan:     1. Encounter to  establish care - Labs collected  2. Essential hypertension - She was advised to continue on losartan- hctz 100-12.5 mg daily, monitor BP regularly at home, continue on low salt diet and exercise at least 30 minutes daily - Comp Met (CMET) - Urinalysis  3. NSTEMI (non-ST elevated myocardial infarction) [facility]) - She will continue on 90 mg Brilinta bid - She has a follow up appointment in March with [facility] health cardiology  4. Health care maintenance - Mammogram and Pap smear scheduled in March - Eye exam scheduled with Dr Olga Millers - Stool card provided for colon cancer screening - Hep C screening contact provided - CBC w/Diff - HgB A1c - TSH -Follow up in 6 months        [place], NP   [facility]

## 2018-11-21 LAB — HEMOGLOBIN A1C
ESTIMATED AVERAGE GLUCOSE: 126 mg/dL
Hgb A1c MFr Bld: 6 % — ABNORMAL HIGH (ref 4.8–5.6)

## 2018-11-21 LAB — CBC WITH DIFFERENTIAL/PLATELET
Basophils Absolute: 0 10*3/uL (ref 0.0–0.2)
Basos: 1 %
EOS (ABSOLUTE): 0.1 10*3/uL (ref 0.0–0.4)
EOS: 1 %
HEMATOCRIT: 37 % (ref 34.0–46.6)
HEMOGLOBIN: 11.9 g/dL (ref 11.1–15.9)
IMMATURE GRANULOCYTES: 0 %
Immature Grans (Abs): 0 10*3/uL (ref 0.0–0.1)
LYMPHS ABS: 1.6 10*3/uL (ref 0.7–3.1)
Lymphs: 31 %
MCH: 26.8 pg (ref 26.6–33.0)
MCHC: 32.2 g/dL (ref 31.5–35.7)
MCV: 83 fL (ref 79–97)
MONOCYTES: 9 %
Monocytes Absolute: 0.5 10*3/uL (ref 0.1–0.9)
NEUTROS PCT: 58 %
Neutrophils Absolute: 2.9 10*3/uL (ref 1.4–7.0)
Platelets: 244 10*3/uL (ref 150–450)
RBC: 4.44 x10E6/uL (ref 3.77–5.28)
RDW: 13.6 % (ref 12.3–15.4)
WBC: 5.1 10*3/uL (ref 3.4–10.8)

## 2018-11-21 LAB — URINALYSIS
Bilirubin, UA: NEGATIVE
Glucose, UA: NEGATIVE
KETONES UA: NEGATIVE
Leukocytes, UA: NEGATIVE
NITRITE UA: NEGATIVE
Protein, UA: NEGATIVE
RBC, UA: NEGATIVE
Specific Gravity, UA: 1.008 (ref 1.005–1.030)
UUROB: 0.2 mg/dL (ref 0.2–1.0)
pH, UA: 6.5 (ref 5.0–7.5)

## 2018-11-21 LAB — COMPREHENSIVE METABOLIC PANEL
ALBUMIN: 3.9 g/dL (ref 3.5–5.5)
ALT: 21 IU/L (ref 0–32)
AST: 27 IU/L (ref 0–40)
Albumin/Globulin Ratio: 1.2 (ref 1.2–2.2)
Alkaline Phosphatase: 58 IU/L (ref 39–117)
BUN / CREAT RATIO: 17 (ref 9–23)
BUN: 17 mg/dL (ref 6–24)
Bilirubin Total: 0.6 mg/dL (ref 0.0–1.2)
CALCIUM: 9.8 mg/dL (ref 8.7–10.2)
CO2: 26 mmol/L (ref 20–29)
CREATININE: 0.98 mg/dL (ref 0.57–1.00)
Chloride: 101 mmol/L (ref 96–106)
GFR calc Af Amer: 73 mL/min/{1.73_m2} (ref >59)
GFR, EST NON AFRICAN AMERICAN: 63 mL/min/{1.73_m2} (ref >59)
GLOBULIN, TOTAL: 3.3 g/dL (ref 1.5–4.5)
Glucose: 86 mg/dL (ref 65–99)
Potassium: 4.4 mmol/L (ref 3.5–5.2)
Sodium: 143 mmol/L (ref 134–144)
TOTAL PROTEIN: 7.2 g/dL (ref 6.0–8.5)

## 2018-11-21 LAB — TSH: TSH: 1.35 u[IU]/mL (ref 0.450–4.500)

## 2018-11-27 ENCOUNTER — Ambulatory Visit: Payer: Medicaid Other | Admitting: Ophthalmology

## 2018-12-02 ENCOUNTER — Other Ambulatory Visit: Payer: Self-pay | Admitting: Cardiovascular Disease

## 2019-01-06 ENCOUNTER — Other Ambulatory Visit: Payer: Self-pay

## 2019-01-06 ENCOUNTER — Encounter: Payer: Self-pay | Admitting: Gerontology

## 2019-01-06 ENCOUNTER — Ambulatory Visit: Payer: Medicaid Other | Admitting: Gerontology

## 2019-01-06 VITALS — BP 141/88 | HR 81 | Temp 98.4°F | Ht 63.0 in | Wt 214.1 lb

## 2019-01-06 DIAGNOSIS — R05 Cough: Secondary | ICD-10-CM

## 2019-01-06 DIAGNOSIS — R059 Cough, unspecified: Secondary | ICD-10-CM

## 2019-01-06 DIAGNOSIS — Z0000 Encounter for general adult medical examination without abnormal findings: Secondary | ICD-10-CM

## 2019-01-06 DIAGNOSIS — R0982 Postnasal drip: Secondary | ICD-10-CM

## 2019-01-06 DIAGNOSIS — H6122 Impacted cerumen, left ear: Secondary | ICD-10-CM

## 2019-01-06 MED ORDER — SALINE SPRAY 0.65 % NA SOLN: 1.0000 | NASAL | 0 refills | Status: DC | PRN

## 2019-01-06 MED ORDER — BENZONATATE 100 MG PO CAPS: 100.0000 mg | ORAL_CAPSULE | Freq: Three times a day (TID) | ORAL | 0 refills | Status: DC | PRN

## 2019-01-06 MED ORDER — CARBAMIDE PEROXIDE 6.5 % OT SOLN: 5.0000 [drp] | Freq: Once | OTIC | Status: DC

## 2019-01-06 NOTE — Patient Instructions (Signed)
Carbamide Peroxide ear solution What is this medicine? CARBAMIDE PEROXIDE (CAR bah mide per OX ide) is used to soften and help remove ear wax. This medicine may be used for other purposes; ask your health care provider or pharmacist if you have questions. COMMON BRAND NAME(S): Auro Ear, Auro Earache Relief, Debrox, Ear Drops, Ear Wax Removal, Ear Wax Remover, Earwax Treatment, Murine, Thera-Ear What should I tell my health care provider before I take this medicine? They need to know if you have any of these conditions: -dizziness -ear discharge -ear pain, irritation or rash -infection -perforated eardrum (hole in eardrum) -an unusual or allergic reaction to carbamide peroxide, glycerin, hydrogen peroxide, other medicines, foods, dyes, or preservatives -pregnant or trying to get pregnant -breast-feeding How should I use this medicine? This medicine is only for use in the outer ear canal. Follow the directions carefully. Wash hands before and after use. The solution may be warmed by holding the bottle in the hand for 1 to 2 minutes. Lie with the affected ear facing upward. Place the proper number of drops into the ear canal. After the drops are instilled, remain lying with the affected ear upward for 5 minutes to help the drops stay in the ear canal. A cotton ball may be gently inserted at the ear opening for no longer than 5 to 10 minutes to ensure retention. Repeat, if necessary, for the opposite ear. Do not touch the tip of the dropper to the ear, fingertips, or other surface. Do not rinse the dropper after use. Keep container tightly closed. Talk to your pediatrician regarding the use of this medicine in children. While this drug may be used in children as young as 12 years for selected conditions, precautions do apply. Overdosage: If you think you have taken too much of this medicine contact a poison control center or emergency room at once. NOTE: This medicine is only for you. Do not share  this medicine with others. What if I miss a dose? If you miss a dose, use it as soon as you can. If it is almost time for your next dose, use only that dose. Do not use double or extra doses. What may interact with this medicine? Interactions are not expected. Do not use any other ear products without asking your doctor or health care professional. This list may not describe all possible interactions. Give your health care provider a list of all the medicines, herbs, non-prescription drugs, or dietary supplements you use. Also tell them if you smoke, drink alcohol, or use illegal drugs. Some items may interact with your medicine. What should I watch for while using this medicine? This medicine is not for long-term use. Do not use for more than 4 days without checking with your health care professional. Contact your doctor or health care professional if your condition does not start to get better within a few days or if you notice burning, redness, itching or swelling. What side effects may I notice from receiving this medicine? Side effects that you should report to your doctor or health care professional as soon as possible: -allergic reactions like skin rash, itching or hives, swelling of the face, lips, or tongue -burning, itching, and redness -worsening ear pain -rash Side effects that usually do not require medical attention (report to your doctor or health care professional if they continue or are bothersome): -abnormal sensation while putting the drops in the ear -temporary reduction in hearing (but not complete loss of hearing) This list may not  or health care professional if they continue or are bothersome):  -abnormal sensation while putting the drops in the ear  -temporary reduction in hearing (but not complete loss of hearing)  This list may not describe all possible side effects. Call your doctor for medical advice about side effects. You may report side effects to FDA at 1-800-FDA-1088.  Where should I keep my medicine?  Keep out of the reach of children.  Store at room temperature between 15 and 30 degrees C (59 and 86 degrees F) in a tight, light-resistant container. Keep bottle away  from excessive heat and direct sunlight. Throw away any unused medicine after the expiration date.  NOTE: This sheet is a summary. It may not cover all possible information. If you have questions about this medicine, talk to your doctor, pharmacist, or health care provider.  © 2019 Elsevier/Gold Standard (2008-02-17 14:00:02)

## 2019-01-06 NOTE — Progress Notes (Signed)
Acute Office Visit  Subjective:    Patient ID: Kelsey Terry, female    DOB: 03-02-1961, 58 y.o.   MRN: [id number]  Chief Complaint  Patient presents with  . Follow-up    cough with yellow phlegm, PND, hoarseness for 4 days    HPI Patient is in today for c/o productive cough with yellowish phlegm, post nasal drip and hoarseness that has being going on for 3 days. She reports that the cough keeps her up at night, and she also has rhinorrhea. She denies sinus congestion, sore throat, headache, shortness of breath, chest pain, palpitation, fever, chills, fatigue and myalgia.  She states that her daughter was positive for Influenza couple of weeks ago, but she's better. Otherwise she denies any further concerns.  Past Medical History:  Diagnosis Date  . Asthma   . CAD in native artery    a. NSTEMI 05/16/18: [facility] 05/16/18 - ost-pLAD 95% s/p PCI/DES, mLAD 30%, ostD1 70%, ost-pLCx 30%, mRCA 90%, dRCA 20%; b. staged PCI/DES to [facility] 7/1  . Diastolic dysfunction    a. TTE 05/16/18: EF 55-60%, no RWMA, Gr1DD, trivial AI, calcified mitral annulus, mild MR, mildly dilated LA  . Hypertension   . Lymphedema    LLE  . Pericarditis    a. ~ 2012 in [place], [place] s/p pericardiocentesis     Past Surgical History:  Procedure Laterality Date  . ABDOMINAL HYSTERECTOMY    . CESAREAN SECTION    . CORONARY STENT INTERVENTION N/A 05/16/2018   Procedure: CORONARY STENT INTERVENTION;  Surgeon: Wellington Hampshire, MD;  Location: [place] [facility];  Service: Cardiovascular;  Laterality: N/A;  . CORONARY STENT INTERVENTION N/A 05/19/2018   Procedure: CORONARY STENT INTERVENTION;  Surgeon: Wellington Hampshire, MD;  Location: [place] [facility];  Service: Cardiovascular;  Laterality: N/A;  . LEFT HEART CATH AND CORONARY ANGIOGRAPHY N/A 05/16/2018   Procedure: LEFT HEART CATH AND CORONARY ANGIOGRAPHY;  Surgeon: Wellington Hampshire, MD;  Location: [place] [facility];  Service: Cardiovascular;  Laterality: N/A;  .  PERICARDIOCENTESIS      Family History  Problem Relation Age of Onset  . Hypertension Mother   . Pancreatic cancer Father   . Diabetes Brother     Social History   Socioeconomic History  . Marital status: Widowed    Spouse name: Not on file  . Number of children: Not on file  . Years of education: Not on file  . Highest education level: Not on file  Occupational History  . Not on file  Social Needs  . Financial resource strain: Not on file  . Food insecurity:    Worry: Not on file    Inability: Not on file  . Transportation needs:    Medical: Not on file    Non-medical: Not on file  Tobacco Use  . Smoking status: Never Smoker  . Smokeless tobacco: Never Used  Substance and Sexual Activity  . Alcohol use: Not Currently  . Drug use: Never  . Sexual activity: Not on file  Lifestyle  . Physical activity:    Days per week: 7 days    Minutes per session: 60 min  . Stress: Not on file  Relationships  . Social connections:    Talks on phone: Not on file    Gets together: Not on file    Attends religious service: Not on file    Active member of club or organization: Not on file    Attends meetings of clubs  or organizations: Not on file    Relationship status: Not on file  . Intimate partner violence:    Fear of current or ex partner: Not on file    Emotionally abused: Not on file    Physically abused: Not on file    Forced sexual activity: Not on file  Other Topics Concern  . Not on file  Social History Narrative  . Not on file    Outpatient Medications Prior to Visit  Medication Sig Dispense Refill  . aspirin EC 81 MG tablet TAKE ONE TABLET BY MOUTH EVERY DAY 60 tablet 3  . losartan-hydrochlorothiazide (HYZAAR) 100-12.5 MG tablet Take 1 tablet by mouth daily. 90 tablet 3  . potassium chloride (K-DUR) 10 MEQ tablet TAKE TWO TABLETS BY MOUTH EVERY DAY 120 tablet 0  . ticagrelor (BRILINTA) 90 MG TABS tablet Take 1 tablet (90 mg total) by mouth 2 (two) times daily.  32 tablet 0  . rosuvastatin (CRESTOR) 40 MG tablet Take 1 tablet (40 mg total) by mouth daily. 90 tablet 3   No facility-administered medications prior to visit.     Allergies  Allergen Reactions  . Amoxicillin Itching and Other (See Comments)    Reaction: bump in vaginal region Has patient had a PCN reaction causing immediate rash, facial/tongue/throat swelling, SOB or lightheadedness with hypotension: No Has patient had a PCN reaction causing severe rash involving mucus membranes or skin necrosis: No Has patient had a PCN reaction that required hospitalization: No Has patient had a PCN reaction occurring within the last 10 years: No If all of the above answers are "NO", then may proceed with Cephalosporin use.     Review of Systems  Constitutional: Negative.   HENT: Positive for sore throat.   Eyes: Negative.   Respiratory: Positive for cough and sputum production (yellowish phlegm). Negative for shortness of breath and wheezing.   Cardiovascular: Negative.   Skin: Negative.   Neurological: Negative.   Psychiatric/Behavioral: Negative.        Objective:    Physical Exam  Constitutional: She is oriented to person, place, and time. She appears well-developed and well-nourished.  HENT:  Head: Normocephalic and atraumatic.  Right Ear: No tenderness. No decreased hearing is noted.  Left Ear: No tenderness. No decreased hearing is noted.  Ears:  Nose: Nose normal. No mucosal edema, rhinorrhea, nose lacerations, sinus tenderness or septal deviation. Right sinus exhibits no maxillary sinus tenderness and no frontal sinus tenderness. Left sinus exhibits no maxillary sinus tenderness and no frontal sinus tenderness.  Mouth/Throat: Mucous membranes are normal. No oropharyngeal exudate, posterior oropharyngeal edema or tonsillar abscesses.  Eyes: Pupils are equal, round, and reactive to light. EOM are normal.  Neck: Normal range of motion.  Cardiovascular: Normal rate, regular  rhythm, normal heart sounds and intact distal pulses.  Pulmonary/Chest: Effort normal and breath sounds normal. No respiratory distress. She has no wheezes. She has no rales. She exhibits no tenderness.  Neurological: She is alert and oriented to person, place, and time.  Skin: Skin is warm.  Psychiatric: She has a normal mood and affect. Her behavior is normal. Judgment and thought content normal.    Ht _0  (1.6 m)   Wt 214 lb 1.6 oz (97.1 kg)   BMI 37.93 kg/m  Wt Readings from Last 3 Encounters:  01/06/19 214 lb 1.6 oz (97.1 kg)  11/20/18 210 lb 1.6 oz (95.3 kg)  08/21/18 204 lb (92.5 kg)  She gained 4 pounds since her last visit and  she states that she has made some life style changes. She was encouraged to continue on low caloric intake, exercise 30 minutes daily.  Health Maintenance Due  Topic Date Due  . Hepatitis C Screening  [birth year]  . HIV Screening  07/15/1974  . TETANUS/TDAP  07/15/1978  . PAP SMEAR-Modifier  07/15/1980  . MAMMOGRAM  07/15/2009  . COLONOSCOPY  07/15/2009  . INFLUENZA VACCINE  06/19/2018    She reports that she had Pap smear in 2019, has Mammogram scheduled next month, provided stool card for occult blood and Hep C information for screening. She declined Influenza vaccine. There are no preventive care reminders to display for this patient.   Lab Results  Component Value Date   TSH 1.350 11/20/2018   Lab Results  Component Value Date   WBC 5.1 11/20/2018   HGB 11.9 11/20/2018   HCT 37.0 11/20/2018   MCV 83 11/20/2018   PLT 244 11/20/2018   Lab Results  Component Value Date   NA 143 11/20/2018   K 4.4 11/20/2018   CO2 26 11/20/2018   GLUCOSE 86 11/20/2018   BUN 17 11/20/2018   CREATININE 0.98 11/20/2018   BILITOT 0.6 11/20/2018   ALKPHOS 58 11/20/2018   AST 27 11/20/2018   ALT 21 11/20/2018   PROT 7.2 11/20/2018   ALBUMIN 3.9 11/20/2018   CALCIUM 9.8 11/20/2018   ANIONGAP 7 06/30/2018   Lab Results  Component Value Date   CHOL  162 07/08/2018   Lab Results  Component Value Date   HDL 49 07/08/2018   Lab Results  Component Value Date   LDLCALC 86 07/08/2018   Lab Results  Component Value Date   TRIG 135 07/08/2018   Lab Results  Component Value Date   CHOLHDL 3.3 07/08/2018   Lab Results  Component Value Date   HGBA1C 6.0 (H) 11/20/2018   She was encouraged to decrease her carbohydrate intake, continue on her weight loss regimen.    Assessment & Plan:   Problem List Items Addressed This Visit    None     1. Cough in adult -  Cough and PND are likely viral and will treat symptoms. She will notify provider for worsening symptoms.She was educated on medication side effects and to notify provider. - benzonatate (TESSALON PERLES) 100 MG capsule; Take 1 capsule (100 mg total) by mouth 3 (three) times daily as needed for cough.  Dispense: 20 capsule; Refill: 0  2. Post-nasal drip  - sodium chloride (OCEAN) 0.65 % SOLN nasal spray; Place 1 spray into both nostrils as needed for congestion.  Dispense: 1 Bottle; Refill: 0  3. Impacted cerumen of left ear - She will use carbamide peroxide (DEBROX) 6.5 % OTIC (EAR) solution 5 drop for left ear cerumen removal.  4. Health care maintenance - The following labs will be collected in 2 months. - HgB A1c; Future - Comp Met (CMET); Future - Lipid panel; Future   No orders of the defined types were placed in this encounter.  Follow up in 2 months or worsening symptoms.  Wakisha Alberts Jerold Coombe, NP

## 2019-01-29 ENCOUNTER — Ambulatory Visit: Payer: Medicaid Other | Admitting: Ophthalmology

## 2019-02-04 ENCOUNTER — Other Ambulatory Visit: Payer: Self-pay

## 2019-02-04 MED ORDER — POTASSIUM CHLORIDE ER 10 MEQ PO TBCR: 20.0000 meq | EXTENDED_RELEASE_TABLET | Freq: Every day | ORAL | 1 refills | Status: DC

## 2019-02-09 ENCOUNTER — Ambulatory Visit: Payer: Self-pay

## 2019-02-10 ENCOUNTER — Telehealth: Payer: Self-pay | Admitting: *Deleted

## 2019-02-10 NOTE — Telephone Encounter (Signed)
   Cardiac Questionnaire:    Since your last visit or hospitalization:    1. Have you been having new or worsening chest pain? No   2. Have you been having new or worsening shortness of breath? No 3. Have you been having new or worsening leg swelling, wt gain, or increase in abdominal girth (pants fitting more tightly)? No   4. Have you had any passing out spells? No    _____________   [id number] Pre-Screening Questions:  . Do you currently have a fever? No  . Have you recently travelled on a cruise, internationally, or to [place], [place], [place], [place], [place], or [place], [place] [employer]) ?  Marland Kitchen Have you been in contact with someone that is currently pending confirmation of Covid19 testing or has been confirmed to have the [place] virus?  No  . Are you currently experiencing fatigue or cough? No   The patient stated that she does not want to have an evisit and would prefer being seen. She has passed the screening but stated that she lives with her daughter who works in the hospital.

## 2019-02-11 NOTE — Telephone Encounter (Signed)
Attempted to reach patient to discuss changing appointment to telephone visit as Dr Fletcher Anon prefers if patient stable.

## 2019-02-11 NOTE — Telephone Encounter (Addendum)
Patient verbalized undersatnding of Dr Tyrell Antonio recommendations and agrees to switch to telephone visit. Consent sent to MyChart. Awaiting response.     TELEPHONE CALL NOTE  Kelsey Terry has been deemed a candidate for a follow-up tele-health visit to limit community exposure during the Covid-19 pandemic. I spoke with the patient via phone to ensure availability of phone/video source, confirm preferred email & phone number, discuss instructions and expectations, and review consent.   I reminded Kelsey Terry to be prepared with any vital sign and/or heart rhythm information that could potentially be obtained via home monitoring, at the time of her visit.  Finally, I reminded Kelsey Terry to expect an e-mail containing a link for their video-based visit approximately 15 minutes before her visit, or alternatively, a phone call at the time of her visit if her visit is planned to be a phone encounter.  Did the patient verbally consent to treatment as below? PENDING, SENT TO MYCHART.  Roney Jaffe, RN 02/11/2019 4:43 PM  DOWNLOADING THE SOFTWARE (If applicable)  Download the [employer] app to enable video and telephone visits with your [facility] Provider.   Instructions for downloading [employer]: - Go to https://www.webex.com/downloads.html and follow the instructions - If you have technical difficulties with downloading WebEx, please call WebEx at [phone]. - Once the app is downloaded (can be done on either mobile or desktop computer), go to Settings in the upper left hand corner.  Be sure that camera and audio are enabled.  - You will receive an email message with a link to the meeting with a time to join for your tele-health visit.  - Please download the app and have settings configured prior to the appointment time.    CONSENT FOR TELE-HEALTH VISIT - PLEASE REVIEW  I hereby voluntarily request, consent and authorize [facility] and its employed or contracted physicians,  physician assistants, nurse practitioners or other licensed health care professionals (the Practitioner), to provide me with telemedicine health care services (the "Services") as deemed necessary by the treating Practitioner. I acknowledge and consent to receive the Services by the Practitioner via telemedicine. I understand that the telemedicine visit will involve communicating with the Practitioner through live audiovisual communication technology and the disclosure of certain medical information by electronic transmission. I acknowledge that I have been given the opportunity to request an in-person assessment or other available alternative prior to the telemedicine visit and am voluntarily participating in the telemedicine visit.  I understand that I have the right to withhold or withdraw my consent to the use of telemedicine in the course of my care at any time, without affecting my right to future care or treatment, and that the Practitioner or I may terminate the telemedicine visit at any time. I understand that I have the right to inspect all information obtained and/or recorded in the course of the telemedicine visit and may receive copies of available information for a reasonable fee.  I understand that some of the potential risks of receiving the Services via telemedicine include:  Marland Kitchen Delay or interruption in medical evaluation due to technological equipment failure or disruption; . Information transmitted may not be sufficient (e.g. poor resolution of images) to allow for appropriate medical decision making by the Practitioner; and/or  . In rare instances, security protocols could fail, causing a breach of personal health information.  Furthermore, I acknowledge that it is my responsibility to provide information about my medical history, conditions and care that is complete and accurate to the  best of my ability. I acknowledge that Practitioner's advice, recommendations, and/or decision may be  based on factors not within their control, such as incomplete or inaccurate data provided by me or distortions of diagnostic images or specimens that may result from electronic transmissions. I understand that the practice of medicine is not an exact science and that Practitioner makes no warranties or guarantees regarding treatment outcomes. I acknowledge that I will receive a copy of this consent concurrently upon execution via email to the email address I last provided but may also request a printed copy by calling the office of [place].    I understand that my insurance will be billed for this visit.   I have read or had this consent read to me. . I understand the contents of this consent, which adequately explains the benefits and risks of the Services being provided via telemedicine.  . I have been provided ample opportunity to ask questions regarding this consent and the Services and have had my questions answered to my satisfaction. . I give my informed consent for the services to be provided through the use of telemedicine in my medical care  By participating in this telemedicine visit I agree to the above.

## 2019-02-11 NOTE — Telephone Encounter (Signed)
Again if she is not having significant cardiac issues or symptoms, I prefer an e-visit at the present time due to the circumstances.  This is better for both the patient, the medical practice and the community overall.

## 2019-02-11 NOTE — Telephone Encounter (Signed)
Patient returning call  Patient is still adamant on wanting to be seen and not an evisit - says that she will if that's what Dr Fletcher Anon wants but it is not something she wants  Please call to discuss

## 2019-02-12 ENCOUNTER — Telehealth (INDEPENDENT_AMBULATORY_CARE_PROVIDER_SITE_OTHER): Payer: Self-pay | Admitting: Cardiovascular Disease

## 2019-02-12 ENCOUNTER — Other Ambulatory Visit: Payer: Self-pay

## 2019-02-12 DIAGNOSIS — I10 Essential (primary) hypertension: Secondary | ICD-10-CM

## 2019-02-12 DIAGNOSIS — Z955 Presence of coronary angioplasty implant and graft: Secondary | ICD-10-CM

## 2019-02-12 DIAGNOSIS — I252 Old myocardial infarction: Secondary | ICD-10-CM

## 2019-02-12 DIAGNOSIS — I2510 Atherosclerotic heart disease of native coronary artery without angina pectoris: Secondary | ICD-10-CM

## 2019-02-12 DIAGNOSIS — E7800 Pure hypercholesterolemia, unspecified: Secondary | ICD-10-CM

## 2019-02-12 DIAGNOSIS — E785 Hyperlipidemia, unspecified: Secondary | ICD-10-CM

## 2019-02-12 DIAGNOSIS — I739 Peripheral vascular disease, unspecified: Secondary | ICD-10-CM

## 2019-02-12 NOTE — Progress Notes (Signed)
Virtual Visit via Telephone Note    Evaluation Performed:  Follow-up visit  This visit type was conducted due to national recommendations for restrictions regarding the COVID-19 Pandemic (e.g. social distancing).  This format is felt to be most appropriate for this patient at this time.  All issues noted in this document were discussed and addressed.  No physical exam was performed (except for noted visual exam findings with Video Visits).  Please refer to the patient's chart (MyChart message for video visits and phone note for telephone visits) for the patient's consent to telehealth for [facility].  Date:  02/12/2019   ID:  Kelsey Terry, DOB Jul 04, 1961, MRN [id number]  Patient Location:  [address] [place] [address]   Provider location:   [place].  PCP:  Patient, No Pcp Per  Cardiologist:   Dr. Fletcher Anon Electrophysiologist:  None   Chief Complaint: Follow-up regarding coronary artery disease  History of Present Illness:    Kelsey Terry is a 58 y.o. female who presents via audio/video conferencing for a telehealth visit today.   She has known history of coronary artery disease and peripheral arterial disease.  Please see my most recent office visit.  She had non-ST elevation myocardial infarction in June 2019.  Cardiac catheterization showed severe two-vessel coronary artery disease involving proximal LAD and mid RCA.  Both were treated with PCI and drug-eluting stent placement.  Echocardiogram showed normal LV systolic function. She was noted to have peripheral arterial disease.  ABI was moderately reduced bilaterally with evidence of bilateral SFA occlusion.  She had no claudication and thus was treated medically. She has been doing extremely well with no recent anginal chest pain or shortness of breath.  She reports occasional brief pinching chest discomfort lasting for few seconds at rest.  She has no claudication.  She did have a viral illness in February but she  has mostly recovered from that.  Currently with no fever or cough.  The patient does not symptoms concerning for COVID-19 infection (fever, chills, cough, or new SHORTNESS OF BREATH).    Prior CV studies:   The following studies were reviewed today:    Past Medical History:  Diagnosis Date  . Asthma   . CAD in native artery    a. NSTEMI 05/16/18: [facility] 05/16/18 - ost-pLAD 95% s/p PCI/DES, mLAD 30%, ostD1 70%, ost-pLCx 30%, mRCA 90%, dRCA 20%; b. staged PCI/DES to [facility] 7/1  . Diastolic dysfunction    a. TTE 05/16/18: EF 55-60%, no RWMA, Gr1DD, trivial AI, calcified mitral annulus, mild MR, mildly dilated LA  . Hypertension   . Lymphedema    LLE  . Pericarditis    a. ~ 2012 in [place], [place] s/p pericardiocentesis    Past Surgical History:  Procedure Laterality Date  . ABDOMINAL HYSTERECTOMY    . CESAREAN SECTION    . CORONARY STENT INTERVENTION N/A 05/16/2018   Procedure: CORONARY STENT INTERVENTION;  Surgeon: Wellington Hampshire, MD;  Location: [place] [facility];  Service: Cardiovascular;  Laterality: N/A;  . CORONARY STENT INTERVENTION N/A 05/19/2018   Procedure: CORONARY STENT INTERVENTION;  Surgeon: Wellington Hampshire, MD;  Location: [place] [facility];  Service: Cardiovascular;  Laterality: N/A;  . LEFT HEART CATH AND CORONARY ANGIOGRAPHY N/A 05/16/2018   Procedure: LEFT HEART CATH AND CORONARY ANGIOGRAPHY;  Surgeon: Wellington Hampshire, MD;  Location: [place] [facility];  Service: Cardiovascular;  Laterality: N/A;  . PERICARDIOCENTESIS       No  outpatient medications have been marked as taking for the 02/12/19 encounter (Appointment) with Wellington Hampshire, MD.   Current Facility-Administered Medications for the 02/12/19 encounter (Appointment) with Wellington Hampshire, MD  Medication  . carbamide peroxide (DEBROX) 6.5 % OTIC (EAR) solution 5 drop     Allergies:   Amoxicillin   Social History   Tobacco Use  . Smoking status: Never Smoker  . Smokeless tobacco: Never Used   Substance Use Topics  . Alcohol use: Not Currently  . Drug use: Never     Family Hx: The patient's family history includes Diabetes in her brother; Hypertension in her mother; Pancreatic cancer in her father.  ROS:   Please see the history of present illness.     All other systems reviewed and are negative.   Labs/Other Tests and Data Reviewed:    Recent Labs: 05/17/2018: Magnesium 1.9 11/20/2018: ALT 21; BUN 17; Creatinine, Ser 0.98; Hemoglobin 11.9; Platelets 244; Potassium 4.4; Sodium 143; TSH 1.350   Recent Lipid Panel Lab Results  Component Value Date/Time   CHOL 162 07/08/2018 03:04 PM   TRIG 135 07/08/2018 03:04 PM   HDL 49 07/08/2018 03:04 PM   CHOLHDL 3.3 07/08/2018 03:04 PM   CHOLHDL 4.2 05/16/2018 12:09 AM   LDLCALC 86 07/08/2018 03:04 PM    Wt Readings from Last 3 Encounters:  01/06/19 214 lb 1.6 oz (97.1 kg)  11/20/18 210 lb 1.6 oz (95.3 kg)  08/21/18 204 lb (92.5 kg)     Exam:    Vital Signs:  There were no vitals taken for this visit.     ASSESSMENT & PLAN:    1.  Coronary artery disease involving native coronary arteries without angina: She is doing well overall with no anginal symptoms.  Continue medical therapy.  She does report easy bruising with dual antiplatelet therapy and my plan is to discontinue ticagrelor in about 6 months from now.  2.  Peripheral arterial disease: Bilateral occlusion of SFA.  No claudication.  Continue medical therapy.  3.  Hyperlipidemia: Continue high-dose rosuvastatin.  The patient is waiting to have a follow-up lipid profile.  If LDL remains above 70, I recommend adding Zetia.  4.  Essential hypertension: She reports that her blood pressure has been well controlled at home.  COVID-19 Education: The signs and symptoms of COVID-19 were discussed with the patient and how to seek care for testing (follow up with PCP or arrange E-visit).  The importance of social distancing was discussed today.  Patient Risk:   After  full review of this patients clinical status, I feel that they are at least moderate risk at this time.  Time:   Today, I have spent 22 minutes with the patient with telehealth technology discussing .     Medication Adjustments/Labs and Tests Ordered: Current medicines are reviewed at length with the patient today.  Concerns regarding medicines are outlined above.  Tests Ordered: No orders of the defined types were placed in this encounter.  Medication Changes: No orders of the defined types were placed in this encounter.   Disposition:  in 6 month(s)  Signed, Kathlyn Sacramento, MD  02/12/2019 10:52 AM    [place]

## 2019-02-12 NOTE — Progress Notes (Signed)
Recall entered  °

## 2019-02-12 NOTE — Patient Instructions (Signed)
Medication Instructions: Continue same medications  Labwork: None  Procedures/Testing: None  Follow-Up: Office follow-up in 6 months  Any Additional Special Instructions Will Be Listed Below (If Applicable).     If you need a refill on your cardiac medications before your next appointment, please call your pharmacy.   

## 2019-02-26 ENCOUNTER — Ambulatory Visit: Payer: Medicaid Other | Admitting: Ophthalmology

## 2019-03-02 ENCOUNTER — Telehealth: Payer: Self-pay | Admitting: Cardiovascular Disease

## 2019-03-02 NOTE — Telephone Encounter (Signed)
Left a message for the patient to call back.  

## 2019-03-02 NOTE — Telephone Encounter (Signed)
Patient calling to discuss risk factors and exposure prevention / quarantine with Nurse.    She states her daughter works in ED registrations and has possibly been exposed but will not discuss.

## 2019-03-03 NOTE — Telephone Encounter (Signed)
Spoke with patient. Her daughter works at [employer] as well as more locally with the registrar office. Daughter was told by employer that she may have been in contact with someone with the [place] virus and will be notified if she is to quarantine. Patient is concerned about what she is supposed to do in this situation. Encouraged her to have daughter ask employer what they recommend family members of staff to do and that patient may possibly need to quarantine as well. Advised patient that [facility] does offer free evisits for people who think they may have it and if she gets symptoms to seek medical advice. She was appreciative.

## 2019-03-12 ENCOUNTER — Ambulatory Visit: Payer: Medicaid Other | Admitting: Gerontology

## 2019-04-10 ENCOUNTER — Other Ambulatory Visit: Payer: Self-pay | Admitting: *Deleted

## 2019-04-10 MED ORDER — POTASSIUM CHLORIDE ER 10 MEQ PO TBCR: 20.0000 meq | EXTENDED_RELEASE_TABLET | Freq: Every day | ORAL | 3 refills | Status: DC

## 2019-05-04 ENCOUNTER — Telehealth: Payer: Self-pay | Admitting: *Deleted

## 2019-05-04 DIAGNOSIS — I2510 Atherosclerotic heart disease of native coronary artery without angina pectoris: Secondary | ICD-10-CM

## 2019-05-04 DIAGNOSIS — Z79899 Other long term (current) drug therapy: Secondary | ICD-10-CM

## 2019-05-04 NOTE — Telephone Encounter (Signed)
Spoke with patient and reviewed that we needed to get updated labs done. She states that she has no insurance and [facility]. Advised that I would call over to them to see what process I need to do in order for her to get that done there and would give her a call back. [facility] at [phone] to inquire about patient having labs done there and they have specific hours. Left detailed message for them to please call me back regarding patient and process to have labs done there Fax number is [phone] and once I reach someone I will see if I can fax order over to them. Called and left voicemail message on patients number that I am waiting to hear back from clinic about labs that need to be done and would be in touch once I find out when she can have that done.

## 2019-05-04 NOTE — Telephone Encounter (Signed)
-----   Message from Wellington Hampshire, MD sent at 05/04/2019 10:11 AM EDT ----- Regarding: RE: Pt took x2 potassium Ok thanks.   [place] triage: Please schedule the patient for basic metabolic profile to make sure she is not hyperkalemic. ----- Message ----- From: Tristan Schroeder, [facility] Sent: 05/04/2019   9:01 AM EDT To: Wellington Hampshire, MD Subject: Pt took x2 potassium                           Dr. Fletcher Anon,  I wanted to notify you that on 04/10/19 we dispensed 20meq tablets of potassium for Ms. Regas.  We did speak with her about the difference at pick-up, however I think there was still misunderstanding and  Ms. Vangorder continued to take 2 of the 43meq tablets (like she had been doing with the 8meq tablets) so she will be out this week.   I have counseled patient on the difference in doses and tablets and have reassigned the RX and we will fill 2x87mq tabs from now on.   Thank you,  Netta Neat, PharmD, [facility]) [phone]

## 2019-05-04 NOTE — Telephone Encounter (Signed)
Spoke with patient and she states that they are closed on Monday. Let her know that once I hear back from them tomorrow I would follow up with her for further instructions. She was appreciative for the call with no further questions at this time.

## 2019-05-05 ENCOUNTER — Ambulatory Visit: Payer: Medicaid Other | Admitting: Gerontology

## 2019-05-05 ENCOUNTER — Other Ambulatory Visit: Payer: Self-pay

## 2019-05-05 ENCOUNTER — Encounter: Payer: Self-pay | Admitting: Gerontology

## 2019-05-05 DIAGNOSIS — I10 Essential (primary) hypertension: Secondary | ICD-10-CM

## 2019-05-05 DIAGNOSIS — L0231 Cutaneous abscess of buttock: Secondary | ICD-10-CM | POA: Insufficient documentation

## 2019-05-05 DIAGNOSIS — I2510 Atherosclerotic heart disease of native coronary artery without angina pectoris: Secondary | ICD-10-CM | POA: Insufficient documentation

## 2019-05-05 HISTORY — DX: Atherosclerotic heart disease of native coronary artery without angina pectoris: I25.10

## 2019-05-05 MED ORDER — DOXYCYCLINE HYCLATE 50 MG PO CAPS: 100.0000 mg | ORAL_CAPSULE | Freq: Two times a day (BID) | ORAL | 0 refills | Status: DC

## 2019-05-05 NOTE — Progress Notes (Signed)
Acute Office Visit  Subjective:    Patient ID: Kelsey Terry, female    DOB: October 22, 1961, 58 y.o.   MRN: [id number]  Chief Complaint  Patient presents with  . Follow-up    boil on L butt cheek for 4 or 5 days   Patient consents to telephone visit, and 2 patient identifiers was used to identify patient.  HPI Patient presents today for c/o boil to left gluteal fold that started 5 days ago. She reports that it was erythematous, warm when it erupted and she had fever of 100.33F. She reports that it is currently draining sero sanguinous exudate for the past 2 days. She denies fever yesterday and today, and she experiences moderate discomfort with  sitting down. She also requests to be tested for Covid 19 because she was in contact with her daughter 2 months ago that works at 2 different hospitals. She states that both of them are asymptomatic . She had a tele health visit with the cardiologist Dr Fletcher Anon on 02/12/19 for history of CAD and PAD. Dr Fletcher Anon recommends Zetia if LDL remains above 70 when lipid panel is checked. She continues on Ticagrelor 90 mg bid, denies bruises, hematuria, hematochezia, chest pain, palpitation and claudication. She also take Losartan-Hydrochlorothiazide 100-12.5 daily, she states that her blood pressure is normal and she is maintaining a healthy lifestyle. Otherwise she denies further concern.  Past Medical History:  Diagnosis Date  . Asthma   . CAD (coronary artery disease) 05/05/2019  . CAD in native artery    a. NSTEMI 05/16/18: [place] 05/16/18 - ost-pLAD 95% s/p PCI/DES, mLAD 30%, ostD1 70%, ost-pLCx 30%, mRCA 90%, dRCA 20%; b. staged PCI/DES to [facility] 7/1  . Diastolic dysfunction    a. TTE 05/16/18: EF 55-60%, no RWMA, Gr1DD, trivial AI, calcified mitral annulus, mild MR, mildly dilated LA  . Hypertension   . Lymphedema    LLE  . Pericarditis    a. ~ 2012 in [place], [place] s/p pericardiocentesis     Past Surgical History:  Procedure Laterality Date  . ABDOMINAL  HYSTERECTOMY    . CESAREAN SECTION    . CORONARY STENT INTERVENTION N/A 05/16/2018   Procedure: CORONARY STENT INTERVENTION;  Surgeon: Wellington Hampshire, MD;  Location: [place] [facility];  Service: Cardiovascular;  Laterality: N/A;  . CORONARY STENT INTERVENTION N/A 05/19/2018   Procedure: CORONARY STENT INTERVENTION;  Surgeon: Wellington Hampshire, MD;  Location: [place] [facility];  Service: Cardiovascular;  Laterality: N/A;  . LEFT HEART CATH AND CORONARY ANGIOGRAPHY N/A 05/16/2018   Procedure: LEFT HEART CATH AND CORONARY ANGIOGRAPHY;  Surgeon: Wellington Hampshire, MD;  Location: [place] [facility];  Service: Cardiovascular;  Laterality: N/A;  . PERICARDIOCENTESIS      Family History  Problem Relation Age of Onset  . Hypertension Mother   . Pancreatic cancer Father   . Diabetes Brother     Social History   Socioeconomic History  . Marital status: Widowed    Spouse name: Not on file  . Number of children: Not on file  . Years of education: Not on file  . Highest education level: Not on file  Occupational History  . Not on file  Social Needs  . Financial resource strain: Not on file  . Food insecurity    Worry: Not on file    Inability: Not on file  . Transportation needs    Medical: Not on file    Non-medical: Not on file  Tobacco Use  .  Smoking status: Never Smoker  . Smokeless tobacco: Never Used  Substance and Sexual Activity  . Alcohol use: Not Currently  . Drug use: Never  . Sexual activity: Not on file  Lifestyle  . Physical activity    Days per week: 7 days    Minutes per session: 60 min  . Stress: Not on file  Relationships  . Social Herbalist on phone: Not on file    Gets together: Not on file    Attends religious service: Not on file    Active member of club or organization: Not on file    Attends meetings of clubs or organizations: Not on file    Relationship status: Not on file  . Intimate partner violence    Fear of current or ex  partner: Not on file    Emotionally abused: Not on file    Physically abused: Not on file    Forced sexual activity: Not on file  Other Topics Concern  . Not on file  Social History Narrative  . Not on file    Outpatient Medications Prior to Visit  Medication Sig Dispense Refill  . aspirin EC 81 MG tablet TAKE ONE TABLET BY MOUTH EVERY DAY 60 tablet 3  . CALCIUM CITRATE-VITAMIN D PO Take 1 tablet by mouth 2 (two) times daily.    Marland Kitchen losartan-hydrochlorothiazide (HYZAAR) 100-12.5 MG tablet Take 1 tablet by mouth daily. 90 tablet 3  . Multiple Vitamin (MULTIVITAMIN) tablet Take 1 tablet by mouth daily.    . potassium chloride (K-DUR) 10 MEQ tablet Take 2 tablets (20 mEq total) by mouth daily. 60 tablet 3  . rosuvastatin (CRESTOR) 40 MG tablet Take 1 tablet (40 mg total) by mouth daily. 90 tablet 3  . ticagrelor (BRILINTA) 90 MG TABS tablet Take 1 tablet (90 mg total) by mouth 2 (two) times daily. 32 tablet 0  . benzonatate (TESSALON PERLES) 100 MG capsule Take 1 capsule (100 mg total) by mouth 3 (three) times daily as needed for cough. 20 capsule 0  . sodium chloride (OCEAN) 0.65 % SOLN nasal spray Place 1 spray into both nostrils as needed for congestion. 1 Bottle 0   Facility-Administered Medications Prior to Visit  Medication Dose Route Frequency Provider Last Rate Last Dose  . carbamide peroxide (DEBROX) 6.5 % OTIC (EAR) solution 5 drop  5 drop Left EAR Once Elmer Boutelle E, NP        Allergies  Allergen Reactions  . Amoxicillin Itching and Other (See Comments)    Reaction: bump in vaginal region Has patient had a PCN reaction causing immediate rash, facial/tongue/throat swelling, SOB or lightheadedness with hypotension: No Has patient had a PCN reaction causing severe rash involving mucus membranes or skin necrosis: No Has patient had a PCN reaction that required hospitalization: No Has patient had a PCN reaction occurring within the last 10 years: No If all of the above  answers are "NO", then may proceed with Cephalosporin use.     Review of Systems  Constitutional: Negative.  Negative for chills and fever.  Respiratory: Negative.   Cardiovascular: Negative.   Genitourinary: Negative.   Skin:       Boil to left gluteal fold  Neurological: Negative.   Psychiatric/Behavioral: Negative.        Objective:    Physical Exam No vital sign or PE was done There were no vitals taken for this visit. Wt Readings from Last 3 Encounters:  01/06/19 214 lb 1.6 oz (  97.1 kg)  11/20/18 210 lb 1.6 oz (95.3 kg)  08/21/18 204 lb (92.5 kg)    Health Maintenance Due  Topic Date Due  . Hepatitis C Screening  1961/08/25  . HIV Screening  07/15/1974  . TETANUS/TDAP  07/15/1978  . PAP SMEAR-Modifier  07/15/1980  . MAMMOGRAM  07/15/2009  . COLONOSCOPY  07/15/2009    There are no preventive care reminders to display for this patient.   Lab Results  Component Value Date   TSH 1.350 11/20/2018   Lab Results  Component Value Date   WBC 5.1 11/20/2018   HGB 11.9 11/20/2018   HCT 37.0 11/20/2018   MCV 83 11/20/2018   PLT 244 11/20/2018   Lab Results  Component Value Date   NA 143 11/20/2018   K 4.4 11/20/2018   CO2 26 11/20/2018   GLUCOSE 86 11/20/2018   BUN 17 11/20/2018   CREATININE 0.98 11/20/2018   BILITOT 0.6 11/20/2018   ALKPHOS 58 11/20/2018   AST 27 11/20/2018   ALT 21 11/20/2018   PROT 7.2 11/20/2018   ALBUMIN 3.9 11/20/2018   CALCIUM 9.8 11/20/2018   ANIONGAP 7 06/30/2018   Lab Results  Component Value Date   CHOL 162 07/08/2018   Lab Results  Component Value Date   HDL 49 07/08/2018   Lab Results  Component Value Date   LDLCALC 86 07/08/2018   Lab Results  Component Value Date   TRIG 135 07/08/2018   Lab Results  Component Value Date   CHOLHDL 3.3 07/08/2018   Lab Results  Component Value Date   HGBA1C 6.0 (H) 11/20/2018       Assessment & Plan:     1. Abscess of left buttock Ddx MRSA ? - She will start  doxycycline after cbc is collected, she was advised on medication side effects. - CBC w/Diff - doxycycline (VIBRAMYCIN) 50 MG capsule; Take 2 capsules (100 mg total) by mouth 2 (two) times daily.  Dispense: 14 capsule; Refill: 0  2. Essential hypertension - She will continue on current treatment regimen. -Low salt DASH diet -Exercise regularly as tolerated -Check blood pressure daily at home and record -Goal is less than 140/90 and normal blood pressure is 120/80   3. Coronary artery disease involving native heart without angina pectoris, unspecified vessel or lesion type - She will continue on current treatment regimen, was advised to go to the ED with active bleeding. She will continue to follow up with Cardiology.    Follow up in 2 weeks ( 05/19/19) or if  Symptom worsens.   Thea Holshouser Jerold Coombe, NP

## 2019-05-05 NOTE — Telephone Encounter (Signed)
Spoke with Cecille Rubin at the [facility] and she states that they will reach out to patient to get this scheduled. Reviewed that she needed BMP and to please let me know if they have any further needs from [place]. Will route message over to [place] at their office with our fax number to send [place] those results.

## 2019-05-06 ENCOUNTER — Other Ambulatory Visit: Payer: Medicaid Other

## 2019-05-06 DIAGNOSIS — Z79899 Other long term (current) drug therapy: Secondary | ICD-10-CM

## 2019-05-06 DIAGNOSIS — I2510 Atherosclerotic heart disease of native coronary artery without angina pectoris: Secondary | ICD-10-CM

## 2019-05-06 DIAGNOSIS — Z0000 Encounter for general adult medical examination without abnormal findings: Secondary | ICD-10-CM

## 2019-05-06 DIAGNOSIS — E785 Hyperlipidemia, unspecified: Secondary | ICD-10-CM

## 2019-05-07 LAB — COMPREHENSIVE METABOLIC PANEL
ALT: 19 IU/L (ref 0–32)
AST: 24 IU/L (ref 0–40)
Albumin/Globulin Ratio: 1.2 (ref 1.2–2.2)
Albumin: 4.1 g/dL (ref 3.8–4.9)
Alkaline Phosphatase: 63 IU/L (ref 39–117)
BUN/Creatinine Ratio: 14 (ref 9–23)
BUN: 15 mg/dL (ref 6–24)
Bilirubin Total: 0.6 mg/dL (ref 0.0–1.2)
CO2: 26 mmol/L (ref 20–29)
Calcium: 9.8 mg/dL (ref 8.7–10.2)
Chloride: 100 mmol/L (ref 96–106)
Creatinine, Ser: 1.06 mg/dL — ABNORMAL HIGH (ref 0.57–1.00)
GFR calc Af Amer: 66 mL/min/{1.73_m2} (ref >59)
GFR calc non Af Amer: 58 mL/min/{1.73_m2} — ABNORMAL LOW (ref >59)
Globulin, Total: 3.5 g/dL (ref 1.5–4.5)
Glucose: 89 mg/dL (ref 65–99)
Potassium: 3.5 mmol/L (ref 3.5–5.2)
Sodium: 139 mmol/L (ref 134–144)
Total Protein: 7.6 g/dL (ref 6.0–8.5)

## 2019-05-07 LAB — LIPID PANEL
Chol/HDL Ratio: 3.1 ratio (ref 0.0–4.4)
Cholesterol, Total: 136 mg/dL (ref 100–199)
HDL: 44 mg/dL (ref >39)
LDL Calculated: 76 mg/dL (ref 0–99)
Triglycerides: 81 mg/dL (ref 0–149)
VLDL Cholesterol Cal: 16 mg/dL (ref 5–40)

## 2019-05-07 LAB — HEMOGLOBIN A1C
Est. average glucose Bld gHb Est-mCnc: 126 mg/dL
Hgb A1c MFr Bld: 6 % — ABNORMAL HIGH (ref 4.8–5.6)

## 2019-05-08 LAB — FECAL OCCULT BLOOD, IMMUNOCHEMICAL: Fecal Occult Bld: NEGATIVE

## 2019-05-12 ENCOUNTER — Other Ambulatory Visit: Payer: Self-pay

## 2019-05-13 ENCOUNTER — Ambulatory Visit
Admission: RE | Admit: 2019-05-13 | Discharge: 2019-05-13 | Disposition: A | Discharge status: Home / Self Care | Payer: MEDICAID | Source: Ambulatory Visit | Attending: Oncology | Admitting: Oncology

## 2019-05-13 ENCOUNTER — Other Ambulatory Visit: Payer: Self-pay

## 2019-05-13 ENCOUNTER — Ambulatory Visit: Payer: MEDICAID | Attending: Oncology

## 2019-05-13 VITALS — BP 131/79 | HR 67 | Temp 98.1°F | Ht 62.5 in | Wt 212.0 lb

## 2019-05-13 DIAGNOSIS — Z0000 Encounter for general adult medical examination without abnormal findings: Secondary | ICD-10-CM

## 2019-05-13 NOTE — Progress Notes (Signed)
See note

## 2019-05-13 NOTE — Progress Notes (Signed)
  Subjective:     Patient ID: Kelsey Terry, female   DOB: 01-02-1961, 58 y.o.   MRN: [id number]  HPI   Review of Systems     Objective:   Physical Exam Chest:     Breasts:        Right: No swelling, bleeding, inverted nipple, mass, nipple discharge, skin change or tenderness.        Left: No swelling, bleeding, inverted nipple, mass, nipple discharge, skin change or tenderness.          Assessment:     58 year old patient presents for [facility] clinic visit. 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    Patient screened, and meets BCCCP eligibility.  Patient does not have insurance, Medicare or Medicaid. Instructed patient on breast self awareness using teach back method.  Clinical breast exam unremarkable.  No mass or lump palpated.  History of hysterectomy for fibroids.    Plan:     Sent for bilateral screening mammogram.

## 2019-05-18 ENCOUNTER — Other Ambulatory Visit: Payer: Self-pay | Admitting: Cardiovascular Disease

## 2019-05-18 ENCOUNTER — Other Ambulatory Visit: Payer: Self-pay | Admitting: Physician Assistant

## 2019-05-18 NOTE — Telephone Encounter (Signed)
Patient called and said she was on the phone with someone and the call dropped. She was discussing her prescriptions for Losartan and hydrochlorothiazide.   In her chart it states that refills were called in for the two of them but she was curious who she was talking to.  Please advise

## 2019-05-19 ENCOUNTER — Other Ambulatory Visit: Payer: Self-pay | Admitting: *Deleted

## 2019-05-19 ENCOUNTER — Other Ambulatory Visit: Payer: Self-pay

## 2019-05-19 ENCOUNTER — Ambulatory Visit: Payer: Medicaid Other | Admitting: Gerontology

## 2019-05-19 DIAGNOSIS — I10 Essential (primary) hypertension: Secondary | ICD-10-CM | POA: Insufficient documentation

## 2019-05-19 DIAGNOSIS — R7303 Prediabetes: Secondary | ICD-10-CM

## 2019-05-19 NOTE — Telephone Encounter (Signed)
Already addressed on 05/18/19

## 2019-05-19 NOTE — Progress Notes (Signed)
Established Patient Office Visit  Subjective:  Patient ID: Kelsey Terry, female    DOB: Jul 12, 1961  Age: 58 y.o. MRN: [id number]  CC:  Chief Complaint  Patient presents with  . Follow-up    review test results   Patient consents to telephone visit and 2 patient identifiers was used to identify patient. HPI Kelsey Terry presents for follow up of boil to left gluteal fold. She reports that she finished the course of antibiotics and the boil has resolved. The result of her mammogram done on 05/13/19 was not available, and her fecal occult stool test done 2 weeks ago was negative. Her serum creatinine done 2 weeks ago was 1.06 and GFR was < 58, and HgbA1c was 6.0. She reports that she's compliant with her medications , checks her blood pressure at home  and continues to make lifestyle modifications. She denies chest pain, palpitation, fever, chills and no further concerns.  Past Medical History:  Diagnosis Date  . Asthma   . CAD (coronary artery disease) 05/05/2019  . CAD in native artery    a. NSTEMI 05/16/18: [place] 05/16/18 - ost-pLAD 95% s/p PCI/DES, mLAD 30%, ostD1 70%, ost-pLCx 30%, mRCA 90%, dRCA 20%; b. staged PCI/DES to [facility] 7/1  . Diastolic dysfunction    a. TTE 05/16/18: EF 55-60%, no RWMA, Gr1DD, trivial AI, calcified mitral annulus, mild MR, mildly dilated LA  . Hypertension   . Lymphedema    LLE  . Pericarditis    a. ~ 2012 in [place], [place] s/p pericardiocentesis     Past Surgical History:  Procedure Laterality Date  . ABDOMINAL HYSTERECTOMY    . CESAREAN SECTION    . CORONARY STENT INTERVENTION N/A 05/16/2018   Procedure: CORONARY STENT INTERVENTION;  Surgeon: Wellington Hampshire, MD;  Location: [place] [facility];  Service: Cardiovascular;  Laterality: N/A;  . CORONARY STENT INTERVENTION N/A 05/19/2018   Procedure: CORONARY STENT INTERVENTION;  Surgeon: Wellington Hampshire, MD;  Location: [place] [facility];  Service: Cardiovascular;  Laterality: N/A;  . LEFT HEART CATH AND  CORONARY ANGIOGRAPHY N/A 05/16/2018   Procedure: LEFT HEART CATH AND CORONARY ANGIOGRAPHY;  Surgeon: Wellington Hampshire, MD;  Location: [place] [facility];  Service: Cardiovascular;  Laterality: N/A;  . PERICARDIOCENTESIS      Family History  Problem Relation Age of Onset  . Hypertension Mother   . Pancreatic cancer Father   . Diabetes Brother   . Breast cancer Neg Hx     Social History   Socioeconomic History  . Marital status: Widowed    Spouse name: Not on file  . Number of children: Not on file  . Years of education: Not on file  . Highest education level: Not on file  Occupational History  . Not on file  Social Needs  . Financial resource strain: Not on file  . Food insecurity    Worry: Not on file    Inability: Not on file  . Transportation needs    Medical: Not on file    Non-medical: Not on file  Tobacco Use  . Smoking status: Never Smoker  . Smokeless tobacco: Never Used  Substance and Sexual Activity  . Alcohol use: Not Currently  . Drug use: Never  . Sexual activity: Not on file  Lifestyle  . Physical activity    Days per week: 7 days    Minutes per session: 60 min  . Stress: Not on file  Relationships  . Social connections  Talks on phone: Not on file    Gets together: Not on file    Attends religious service: Not on file    Active member of club or organization: Not on file    Attends meetings of clubs or organizations: Not on file    Relationship status: Not on file  . Intimate partner violence    Fear of current or ex partner: Not on file    Emotionally abused: Not on file    Physically abused: Not on file    Forced sexual activity: Not on file  Other Topics Concern  . Not on file  Social History Narrative  . Not on file    Outpatient Medications Prior to Visit  Medication Sig Dispense Refill  . aspirin EC 81 MG tablet TAKE ONE TABLET BY MOUTH EVERY DAY 60 tablet 3  . CALCIUM CITRATE-VITAMIN D PO Take 1 tablet by mouth 2 (two) times  daily.    . hydrochlorothiazide (MICROZIDE) 12.5 MG capsule TAKE ONE TABLET BY MOUTH EVERY DAY 90 capsule 3  . losartan (COZAAR) 100 MG tablet TAKE ONE TABLET BY MOUTH EVERY DAY. TAKE WITH HCTZ 90 tablet 3  . Multiple Vitamin (MULTIVITAMIN) tablet Take 1 tablet by mouth daily.    . potassium chloride (K-DUR) 10 MEQ tablet Take 2 tablets (20 mEq total) by mouth daily. 60 tablet 3  . ticagrelor (BRILINTA) 90 MG TABS tablet Take 1 tablet (90 mg total) by mouth 2 (two) times daily. 32 tablet 0  . doxycycline (VIBRAMYCIN) 50 MG capsule Take 2 capsules (100 mg total) by mouth 2 (two) times daily. 14 capsule 0  . rosuvastatin (CRESTOR) 40 MG tablet Take 1 tablet (40 mg total) by mouth daily. 90 tablet 3   Facility-Administered Medications Prior to Visit  Medication Dose Route Frequency Provider Last Rate Last Dose  . carbamide peroxide (DEBROX) 6.5 % OTIC (EAR) solution 5 drop  5 drop Left EAR Once Obe Ahlers E, NP        Allergies  Allergen Reactions  . Amoxicillin Itching and Other (See Comments)    Reaction: bump in vaginal region Has patient had a PCN reaction causing immediate rash, facial/tongue/throat swelling, SOB or lightheadedness with hypotension: No Has patient had a PCN reaction causing severe rash involving mucus membranes or skin necrosis: No Has patient had a PCN reaction that required hospitalization: No Has patient had a PCN reaction occurring within the last 10 years: No If all of the above answers are "NO", then may proceed with Cephalosporin use.     ROS Review of Systems  Constitutional: Negative.   Respiratory: Negative.   Cardiovascular: Negative.   Genitourinary: Negative.   Musculoskeletal: Negative.   Skin: Negative.   Neurological: Negative.   Hematological: Negative.   Psychiatric/Behavioral: Negative.       Objective:    Physical Exam No vital sign or PE was done There were no vitals taken for this visit. Wt Readings from Last 3 Encounters:   05/13/19 212 lb (96.2 kg)  01/06/19 214 lb 1.6 oz (97.1 kg)  11/20/18 210 lb 1.6 oz (95.3 kg)     Health Maintenance Due  Topic Date Due  . Hepatitis C Screening  [birth year]  . HIV Screening  07/15/1974  . TETANUS/TDAP  07/15/1978  . PAP SMEAR-Modifier  07/15/1980  . MAMMOGRAM  07/15/2009  . COLONOSCOPY  07/15/2009    There are no preventive care reminders to display for this patient.  Lab Results  Component Value Date  TSH 1.350 11/20/2018   Lab Results  Component Value Date   WBC 5.1 11/20/2018   HGB 11.9 11/20/2018   HCT 37.0 11/20/2018   MCV 83 11/20/2018   PLT 244 11/20/2018   Lab Results  Component Value Date   NA 139 05/06/2019   K 3.5 05/06/2019   CO2 26 05/06/2019   GLUCOSE 89 05/06/2019   BUN 15 05/06/2019   CREATININE 1.06 (H) 05/06/2019   BILITOT 0.6 05/06/2019   ALKPHOS 63 05/06/2019   AST 24 05/06/2019   ALT 19 05/06/2019   PROT 7.6 05/06/2019   ALBUMIN 4.1 05/06/2019   CALCIUM 9.8 05/06/2019   ANIONGAP 7 06/30/2018   Lab Results  Component Value Date   CHOL 136 05/06/2019   Lab Results  Component Value Date   HDL 44 05/06/2019   Lab Results  Component Value Date   LDLCALC 76 05/06/2019   Lab Results  Component Value Date   TRIG 81 05/06/2019   Lab Results  Component Value Date   CHOLHDL 3.1 05/06/2019   Lab Results  Component Value Date   HGBA1C 6.0 (H) 05/06/2019      Assessment & Plan:     1. Essential hypertension - She will continue on current treatment regimen, will recheck  - Comp Met (CMET); Future   2. Prediabetes - HgbA1c done 2 weeks ago was 6%, She agreed to work on life style modifications and we will discuss about starting metformin at her follow up appointment.  Follow-up: Return in about 4 weeks (around 06/16/2019), or if symptoms worsen or fail to improve.    Gala Padovano Jerold Coombe, NP

## 2019-05-19 NOTE — Telephone Encounter (Signed)
Please advise if ok to refill Losartan 100 mg qd. #90 day refill request.  Losartan 100 mg table not on pt's last OV medication list.

## 2019-05-19 NOTE — Patient Instructions (Signed)

## 2019-05-19 NOTE — Telephone Encounter (Signed)
Spoke with pt pt. Adv her that both her losartan and hctz were refilled on 05/18/19. Pt has no questions or concerns at this time. Nothing further needed.

## 2019-05-19 NOTE — Telephone Encounter (Signed)
This was previously addressed. Losartan 100mg  daily was refilled on 05/18/19.

## 2019-05-19 NOTE — Telephone Encounter (Signed)
Please advise if ok to refill HCTZ 12.5 qd. 90 day refill Medication not on pt's last med list separately.

## 2019-05-20 DIAGNOSIS — R7303 Prediabetes: Secondary | ICD-10-CM | POA: Insufficient documentation

## 2019-06-04 ENCOUNTER — Other Ambulatory Visit: Payer: Medicaid Other

## 2019-06-04 ENCOUNTER — Ambulatory Visit: Payer: Self-pay | Admitting: Gerontology

## 2019-06-05 NOTE — Progress Notes (Signed)
Letter mailed from [facility] to notify of normal mammogram results.  Patient to return in one year for annual screening.  Copy to [facility]. 

## 2019-06-10 ENCOUNTER — Other Ambulatory Visit: Payer: Self-pay

## 2019-06-10 ENCOUNTER — Other Ambulatory Visit: Payer: Medicaid Other

## 2019-06-10 DIAGNOSIS — I10 Essential (primary) hypertension: Secondary | ICD-10-CM

## 2019-06-11 LAB — COMPREHENSIVE METABOLIC PANEL
ALT: 16 IU/L (ref 0–32)
AST: 19 IU/L (ref 0–40)
Albumin/Globulin Ratio: 1.2 (ref 1.2–2.2)
Albumin: 4.3 g/dL (ref 3.8–4.9)
Alkaline Phosphatase: 59 IU/L (ref 39–117)
BUN/Creatinine Ratio: 17 (ref 9–23)
BUN: 18 mg/dL (ref 6–24)
Bilirubin Total: 0.8 mg/dL (ref 0.0–1.2)
CO2: 25 mmol/L (ref 20–29)
Calcium: 9.6 mg/dL (ref 8.7–10.2)
Chloride: 102 mmol/L (ref 96–106)
Creatinine, Ser: 1.08 mg/dL — ABNORMAL HIGH (ref 0.57–1.00)
GFR calc Af Amer: 65 mL/min/{1.73_m2} (ref >59)
GFR calc non Af Amer: 56 mL/min/{1.73_m2} — ABNORMAL LOW (ref >59)
Globulin, Total: 3.5 g/dL (ref 1.5–4.5)
Glucose: 94 mg/dL (ref 65–99)
Potassium: 3.5 mmol/L (ref 3.5–5.2)
Sodium: 143 mmol/L (ref 134–144)
Total Protein: 7.8 g/dL (ref 6.0–8.5)

## 2019-06-16 ENCOUNTER — Encounter: Payer: Self-pay | Admitting: Gerontology

## 2019-06-16 ENCOUNTER — Other Ambulatory Visit: Payer: Self-pay

## 2019-06-16 ENCOUNTER — Ambulatory Visit: Payer: Medicaid Other | Admitting: Gerontology

## 2019-06-16 VITALS — BP 113/84 | Ht 62.0 in | Wt 190.0 lb

## 2019-06-16 DIAGNOSIS — R7303 Prediabetes: Secondary | ICD-10-CM | POA: Insufficient documentation

## 2019-06-16 DIAGNOSIS — I10 Essential (primary) hypertension: Secondary | ICD-10-CM

## 2019-06-16 DIAGNOSIS — I890 Lymphedema, not elsewhere classified: Secondary | ICD-10-CM | POA: Insufficient documentation

## 2019-06-16 DIAGNOSIS — R899 Unspecified abnormal finding in specimens from other organs, systems and tissues: Secondary | ICD-10-CM | POA: Insufficient documentation

## 2019-06-16 MED ORDER — HYDROCHLOROTHIAZIDE 12.5 MG PO CAPS: ORAL_CAPSULE | ORAL | 2 refills | Status: DC

## 2019-06-16 NOTE — Progress Notes (Signed)
Established Patient Office Visit  Subjective:  Patient ID: Kelsey Terry, female    DOB: May 18, 1961  Age: 58 y.o. MRN: [id number]  CC: No chief complaint on file. Patient consents to telephone visit and 2 patient identifiers was used to identify patient.  HPI Kelsey Terry presents for follow up of CAD, PAD, hypertension, lymphedema to LLE ,and lab review. She continues to take her medications as prescribed. She monitors her blood pressure, complies to healthy diet and exercises as tolerated. She denies chest pain, shortness of breath, palpitations, dizziness or syncope, claudication, bruises, hematochezia and hematuria. Her serum creatinine done 6 days ago was 1.08 and GFR cal non AA was < 56. She was advised 4 days ago to take 12.5 mg hctz every other day because of her increasing serum creatinine. She states that she feels better and voids more since she started taking hctz every other day. She endorses having a history of Lymphedema to her left lower leg, and her leg stays swollen all the time. She states that she has not being wearing her support hose because of the warm weather, but elevates her leg while sitting down. Also her HgbA1c was 6% and she was educated on the need to start Metformin. She denies fever, chills and no further concerns.  Past Medical History:  Diagnosis Date  . Asthma   . CAD (coronary artery disease) 05/05/2019  . CAD in native artery    a. NSTEMI 05/16/18: [place] 05/16/18 - ost-pLAD 95% s/p PCI/DES, mLAD 30%, ostD1 70%, ost-pLCx 30%, mRCA 90%, dRCA 20%; b. staged PCI/DES to [facility] 7/1  . Diastolic dysfunction    a. TTE 05/16/18: EF 55-60%, no RWMA, Gr1DD, trivial AI, calcified mitral annulus, mild MR, mildly dilated LA  . Hypertension   . Lymphedema    LLE  . Pericarditis    a. ~ 2012 in [place], [place] s/p pericardiocentesis     Past Surgical History:  Procedure Laterality Date  . ABDOMINAL HYSTERECTOMY    . CESAREAN SECTION    . CORONARY STENT INTERVENTION N/A  05/16/2018   Procedure: CORONARY STENT INTERVENTION;  Surgeon: Wellington Hampshire, MD;  Location: [facility] [place] [facility];  Service: Cardiovascular;  Laterality: N/A;  . CORONARY STENT INTERVENTION N/A 05/19/2018   Procedure: CORONARY STENT INTERVENTION;  Surgeon: Wellington Hampshire, MD;  Location: [place] [facility];  Service: Cardiovascular;  Laterality: N/A;  . LEFT HEART CATH AND CORONARY ANGIOGRAPHY N/A 05/16/2018   Procedure: LEFT HEART CATH AND CORONARY ANGIOGRAPHY;  Surgeon: Wellington Hampshire, MD;  Location: [facility];  Service: Cardiovascular;  Laterality: N/A;  . PERICARDIOCENTESIS      Family History  Problem Relation Age of Onset  . Hypertension Mother   . Pancreatic cancer Father   . Diabetes Brother   . Breast cancer Neg Hx     Social History   Socioeconomic History  . Marital status: Widowed    Spouse name: Not on file  . Number of children: Not on file  . Years of education: Not on file  . Highest education level: Not on file  Occupational History  . Not on file  Social Needs  . Financial resource strain: Very hard  . Food insecurity    Worry: Often true    Inability: Often true  . Transportation needs    Medical: No    Non-medical: No  Tobacco Use  . Smoking status: Never Smoker  . Smokeless tobacco: Never Used  Substance and Sexual Activity  .  Alcohol use: Not Currently  . Drug use: Never  . Sexual activity: Not on file  Lifestyle  . Physical activity    Days per week: 3 days    Minutes per session: 60 min  . Stress: Only a little  Relationships  . Social connections    Talks on phone: More than three times a week    Gets together: More than three times a week    Attends religious service: More than 4 times per year    Active member of club or organization: Yes    Attends meetings of clubs or organizations: Never    Relationship status: Widowed  . Intimate partner violence    Fear of current or ex partner: No    Emotionally abused: No     Physically abused: No    Forced sexual activity: No  Other Topics Concern  . Not on file  Social History Narrative  . Not on file    Outpatient Medications Prior to Visit  Medication Sig Dispense Refill  . aspirin EC 81 MG tablet TAKE ONE TABLET BY MOUTH EVERY DAY 60 tablet 3  . CALCIUM CITRATE-VITAMIN D PO Take 1 tablet by mouth 2 (two) times daily.    Marland Kitchen losartan (COZAAR) 100 MG tablet TAKE ONE TABLET BY MOUTH EVERY DAY. TAKE WITH HCTZ 90 tablet 3  . Multiple Vitamin (MULTIVITAMIN) tablet Take 1 tablet by mouth daily.    . potassium chloride (K-DUR) 10 MEQ tablet Take 2 tablets (20 mEq total) by mouth daily. 60 tablet 3  . ticagrelor (BRILINTA) 90 MG TABS tablet Take 1 tablet (90 mg total) by mouth 2 (two) times daily. 32 tablet 0  . hydrochlorothiazide (MICROZIDE) 12.5 MG capsule TAKE ONE TABLET BY MOUTH EVERY DAY 90 capsule 3  . rosuvastatin (CRESTOR) 40 MG tablet Take 1 tablet (40 mg total) by mouth daily. 90 tablet 3  . carbamide peroxide (DEBROX) 6.5 % OTIC (EAR) solution 5 drop      No facility-administered medications prior to visit.     Allergies  Allergen Reactions  . Amoxicillin Itching and Other (See Comments)    Reaction: bump in vaginal region Has patient had a PCN reaction causing immediate rash, facial/tongue/throat swelling, SOB or lightheadedness with hypotension: No Has patient had a PCN reaction causing severe rash involving mucus membranes or skin necrosis: No Has patient had a PCN reaction that required hospitalization: No Has patient had a PCN reaction occurring within the last 10 years: No If all of the above answers are "NO", then may proceed with Cephalosporin use.     ROS Review of Systems  Constitutional: Negative.   HENT: Negative.   Respiratory: Negative.   Cardiovascular: Positive for leg swelling (chronic lymphedema to left lower leg.).  Genitourinary: Negative.   Musculoskeletal: Negative.   Skin: Negative.   Neurological: Negative.    Hematological: Negative.   Psychiatric/Behavioral: Negative.       Objective:    Physical Exam No vital sign or PE done BP 113/84 Comment: PT stated  Ht 5' 2" (1.575 m)   Wt 190 lb (86.2 kg)   BMI 34.75 kg/m  Wt Readings from Last 3 Encounters:  06/16/19 190 lb (86.2 kg)  05/13/19 212 lb (96.2 kg)  01/06/19 214 lb 1.6 oz (97.1 kg)     Health Maintenance Due  Topic Date Due  . Hepatitis C Screening  Apr 08, 1961  . HIV Screening  07/15/1974  . TETANUS/TDAP  07/15/1978  . PAP SMEAR-Modifier  07/15/1980  .  COLONOSCOPY  07/15/2009    There are no preventive care reminders to display for this patient.  Lab Results  Component Value Date   TSH 1.350 11/20/2018   Lab Results  Component Value Date   WBC 5.1 11/20/2018   HGB 11.9 11/20/2018   HCT 37.0 11/20/2018   MCV 83 11/20/2018   PLT 244 11/20/2018   Lab Results  Component Value Date   NA 143 06/10/2019   K 3.5 06/10/2019   CO2 25 06/10/2019   GLUCOSE 94 06/10/2019   BUN 18 06/10/2019   CREATININE 1.08 (H) 06/10/2019   BILITOT 0.8 06/10/2019   ALKPHOS 59 06/10/2019   AST 19 06/10/2019   ALT 16 06/10/2019   PROT 7.8 06/10/2019   ALBUMIN 4.3 06/10/2019   CALCIUM 9.6 06/10/2019   ANIONGAP 7 06/30/2018   Lab Results  Component Value Date   CHOL 136 05/06/2019   Lab Results  Component Value Date   HDL 44 05/06/2019   Lab Results  Component Value Date   LDLCALC 76 05/06/2019   Lab Results  Component Value Date   TRIG 81 05/06/2019   Lab Results  Component Value Date   CHOLHDL 3.1 05/06/2019   Lab Results  Component Value Date   HGBA1C 6.0 (H) 05/06/2019      Assessment & Plan:     1. Essential hypertension - She agreed to take hctz every other day. Her blood is controlled. -Low salt DASH diet -Take medications regularly on time -Exercise regularly as tolerated -Check blood pressure daily at home and record -Goal is less than 140/90 and normal blood pressure is 120/80 -  hydrochlorothiazide (MICROZIDE) 12.5 MG capsule; TAKE ONE TABLET BY MOUTH EVERY OTHER DAY  Dispense: 90 capsule; Refill: 2  2. Lymphedema of left leg - Elevate your legs up above heart level while resting - Wear compression stockings if standing or walking for a while -Reduce sodium intake and limit fluid intake -Exercise daily as tolerated.  3. Prediabetes - Her HgbA1c was 6 % and she declined taking Metformin, and states that she will make some lifestyle modifications. -Use Diabetic diet as advised  -Regular exercise  4. Abnormal laboratory test result - She was advised to increase water intake and will recheck - Comp Met (CMET); Future   Follow-up: Return in about 5 weeks (around 07/21/2019), or if symptoms worsen or fail to improve.    Chioma Jerold Coombe, NP

## 2019-06-18 ENCOUNTER — Telehealth: Payer: Self-pay

## 2019-06-18 NOTE — Telephone Encounter (Signed)
Patient called requesting bus pass for her appt at end of Aug.  [facility] will mail bus pass to her.

## 2019-07-06 ENCOUNTER — Telehealth: Payer: Self-pay | Admitting: Pharmacy Technician

## 2019-07-06 NOTE — Telephone Encounter (Signed)
Received 2020 proof of income.  Patient eligible to receive medication assistance at [facility] as long as eligibility requirements continue to be met.  [facility]

## 2019-07-07 ENCOUNTER — Other Ambulatory Visit: Payer: Self-pay | Admitting: Physician Assistant

## 2019-07-14 ENCOUNTER — Telehealth: Payer: Self-pay | Admitting: Cardiovascular Disease

## 2019-07-14 NOTE — Telephone Encounter (Signed)
Patient calling the office for samples of medication:   1.  What medication and dosage are you requesting samples for? Brilinta 90 MG - 1 tablet 2 times daily   2.  Are you currently out of this medication? Almost out, [employer] is awaiting paperwork for assistance.

## 2019-07-15 ENCOUNTER — Other Ambulatory Visit: Payer: Self-pay

## 2019-07-15 ENCOUNTER — Other Ambulatory Visit: Payer: Medicaid Other

## 2019-07-15 DIAGNOSIS — R899 Unspecified abnormal finding in specimens from other organs, systems and tissues: Secondary | ICD-10-CM

## 2019-07-15 NOTE — Telephone Encounter (Signed)
Patient is out please advise

## 2019-07-15 NOTE — Telephone Encounter (Signed)
Patient calling again regarding sample availability. Please advise

## 2019-07-15 NOTE — Telephone Encounter (Signed)
Spoke with the patient. She is currently out of Brilinta and took her last pill yesterday. Adv her that we do not have Brilinta 90mg  samples. We may have some 60mg . Adv the patient that I was awaiting a response from one of our Pharmacist to make sure it would be ok to cut in 1/2.  Hinton Rao, PharmD says that Kary Kos is not scored, but it would ok to do short term.  Patient is working with medication mgmt for patients assistance for Brilinta. [phone]. She has contacted [facility] and was told they contacted our office yesterday and they have not received a response. She is not able to come to the office before 5pm today to pick up samples.  Adv the patient that I do not see any documentation of a call or fax received from [facility] or [facility]. Adv the patient that I will be in the office tomorrow and will try to locate paperwork if received.  I will also attempt to locate samples if available and call her to update. Patient verbalized understanding and voiced appreciation for the assistance.

## 2019-07-16 ENCOUNTER — Telehealth: Payer: Self-pay | Admitting: Pharmacist

## 2019-07-16 ENCOUNTER — Other Ambulatory Visit: Payer: Self-pay | Admitting: Gerontology

## 2019-07-16 DIAGNOSIS — I890 Lymphedema, not elsewhere classified: Secondary | ICD-10-CM

## 2019-07-16 LAB — COMPREHENSIVE METABOLIC PANEL
ALT: 16 IU/L (ref 0–32)
AST: 25 IU/L (ref 0–40)
Albumin/Globulin Ratio: 1.2 (ref 1.2–2.2)
Albumin: 4.1 g/dL (ref 3.8–4.9)
Alkaline Phosphatase: 60 IU/L (ref 39–117)
BUN/Creatinine Ratio: 17 (ref 9–23)
BUN: 20 mg/dL (ref 6–24)
Bilirubin Total: 0.8 mg/dL (ref 0.0–1.2)
CO2: 26 mmol/L (ref 20–29)
Calcium: 10 mg/dL (ref 8.7–10.2)
Chloride: 102 mmol/L (ref 96–106)
Creatinine, Ser: 1.21 mg/dL — ABNORMAL HIGH (ref 0.57–1.00)
GFR calc Af Amer: 57 mL/min/{1.73_m2} — ABNORMAL LOW (ref >59)
GFR calc non Af Amer: 49 mL/min/{1.73_m2} — ABNORMAL LOW (ref >59)
Globulin, Total: 3.4 g/dL (ref 1.5–4.5)
Glucose: 85 mg/dL (ref 65–99)
Potassium: 3.5 mmol/L (ref 3.5–5.2)
Sodium: 143 mmol/L (ref 134–144)
Total Protein: 7.5 g/dL (ref 6.0–8.5)

## 2019-07-16 NOTE — Telephone Encounter (Signed)
Called patient has vm tx to me from [facility] 07/16/2019 Kelsey Terry  07/16/2019 Kelsey Terry received message from patient and transferred to me for my information-Patient has called [facility] on her Brilinta- and they told her she has a refill left from last year and they are shipping to her home. Patient wanted [place] to know that she had called and was getting a shipment at her home--and requested a call. I reviewed the notes and called patient-- I explained that I when I called for refill 01/09/2019 I was told that was patient's last refill, and enrollment ends 06/26/2019, I had noted for April to send script to Dr, I did and faxed to [facility] 03/20/2019 for refill. When I called [place] on 07/13/2019 spoke with Butch Penny she stated they had no record of receiving the script I faxed 03/20/2019 and there were No refills, and patient enrollment ended 06/26/2019, at that time I printed a renewal application, sent Interoffice to Dr. Tyrell Antonio office to sign & return-I received a call today from [facility] @ [place] to get address to mail forms back to [place], I called her back [phone] and advised to just Interoffice back to [facility] Attn: Annette. The patient has signed her portion of renewal application [id number]. I also explained to patient that I was told they did not have a record of the script I faxed in May. She will be receiving her refill at her home per her request, and according to [facility] patient is picking up samples from Dr. office. When I receive the forms from the provider I will then send to [facility] for Renewal. The patient stated she called [place] and was told one thing and we were telling her another. When our conversation started she asked me to hold, so I am not sure if she had someone listing in on the call or not. I told patient I was very sorry for the delay, but we had given her the information I was told from [facility]. I am also coping this note for [facility] reference.Kelsey Terry

## 2019-07-16 NOTE — Telephone Encounter (Signed)
Spoke with Anne Ng at [facility]. Per Anne Ng forms can be inter-officed to her attention. Signed application placed in an interoffice envelope and placed in the out going mail.

## 2019-07-16 NOTE — Telephone Encounter (Signed)
Called the patient. Lmtcb. Samples of Brilinta 60mg  has been left at the front desk for the patient to pick up. Her dosage is 90mg  bid she will need to take 1 and 1/2 tablet twice daily.      Drug name: Brilinta        Strength: 60mg         Qty: 3 boxes                   LOT: [id number] Exp.Date: 04/22   .

## 2019-07-16 NOTE — Telephone Encounter (Signed)
lmtcb for Kelsey Terry @ [facility] [phone]. Form has been signed by Dr.Arida. The cover letter instructions state the application must be mailed not faxes accepted. Will need to verify their mailing address.

## 2019-07-21 ENCOUNTER — Encounter: Payer: Self-pay | Admitting: Gerontology

## 2019-07-21 ENCOUNTER — Ambulatory Visit: Payer: Medicaid Other | Admitting: Gerontology

## 2019-07-21 ENCOUNTER — Other Ambulatory Visit: Payer: Self-pay

## 2019-07-21 VITALS — BP 124/77 | HR 71 | Temp 97.4°F | Ht 62.0 in | Wt 217.0 lb

## 2019-07-21 DIAGNOSIS — I2510 Atherosclerotic heart disease of native coronary artery without angina pectoris: Secondary | ICD-10-CM

## 2019-07-21 DIAGNOSIS — R899 Unspecified abnormal finding in specimens from other organs, systems and tissues: Secondary | ICD-10-CM

## 2019-07-21 DIAGNOSIS — I10 Essential (primary) hypertension: Secondary | ICD-10-CM

## 2019-07-21 DIAGNOSIS — I890 Lymphedema, not elsewhere classified: Secondary | ICD-10-CM

## 2019-07-21 DIAGNOSIS — Z0000 Encounter for general adult medical examination without abnormal findings: Secondary | ICD-10-CM

## 2019-07-21 MED ORDER — BLOOD PRESSURE KIT: 1.0000 | PACK | Freq: Every day | 0 refills | Status: DC

## 2019-07-21 MED ORDER — CHLORTHALIDONE 25 MG PO TABS: 12.5000 mg | ORAL_TABLET | Freq: Every day | ORAL | 0 refills | Status: DC

## 2019-07-21 MED ORDER — ASPIRIN EC 81 MG PO TBEC: 81.0000 mg | DELAYED_RELEASE_TABLET | Freq: Every day | ORAL | 2 refills | Status: DC

## 2019-07-21 MED ORDER — POTASSIUM CHLORIDE ER 10 MEQ PO TBCR: 20.0000 meq | EXTENDED_RELEASE_TABLET | Freq: Every day | ORAL | 2 refills | Status: DC

## 2019-07-21 MED ORDER — ROSUVASTATIN CALCIUM 40 MG PO TABS: 40.0000 mg | ORAL_TABLET | Freq: Every day | ORAL | 2 refills | Status: DC

## 2019-07-21 MED ORDER — TICAGRELOR 90 MG PO TABS: 90.0000 mg | ORAL_TABLET | Freq: Two times a day (BID) | ORAL | 2 refills | Status: DC

## 2019-07-21 NOTE — Progress Notes (Signed)
Established Patient Office Visit  Subjective:  Patient ID: Kelsey Terry, female    DOB: [birth year]  Age: 58 y.o. MRN: [id number]  CC:  Chief Complaint  Patient presents with  . Hypertension    HPI Kelsey Terry presents for follow up of Hypertension, lymphedema, lab review and medication refill. She reports being compliant with her medications, states that she is unable to check blood pressure at home because her blood pressure machine stopped functioning. She denies chest pain, palpitation and light headedness. She continues on low sodium diet and exercises as tolerated. Her hydrochlorothiazide was discontinued due to increasing serum creatinine from 1.08 to 1.21 mg/dl in one monthm and  eGFR of 57 . She has lower leg Lymphedema and she reports that she took one 12.5 mg of hctz because of edema to bilateral lower extremities and was unable to wear her shoes. She states that she elevates her legs while sitting and applies ace wrap. She states that she has a follow up appointment with [facility]  on 08/06/19. She has not had colonoscopy done, denies hematochezia, but admits intermittent bruises from taking Ticagrelor. She states that she's doing well and offers no further concerns.  Past Medical History:  Diagnosis Date  . Asthma   . CAD (coronary artery disease) 05/05/2019  . CAD in native artery    a. NSTEMI 05/16/18: [place] 05/16/18 - ost-pLAD 95% s/p PCI/DES, mLAD 30%, ostD1 70%, ost-pLCx 30%, mRCA 90%, dRCA 20%; b. staged PCI/DES to [facility] 7/1  . Diastolic dysfunction    a. TTE 05/16/18: EF 55-60%, no RWMA, Gr1DD, trivial AI, calcified mitral annulus, mild MR, mildly dilated LA  . Hypertension   . Lymphedema    LLE  . Pericarditis    a. ~ 2012 in [place], [place] s/p pericardiocentesis     Past Surgical History:  Procedure Laterality Date  . ABDOMINAL HYSTERECTOMY    . CESAREAN SECTION    . CORONARY STENT INTERVENTION N/A 05/16/2018   Procedure: CORONARY STENT INTERVENTION;   Surgeon: Wellington Hampshire, MD;  Location: [facility];  Service: Cardiovascular;  Laterality: N/A;  . CORONARY STENT INTERVENTION N/A 05/19/2018   Procedure: CORONARY STENT INTERVENTION;  Surgeon: Wellington Hampshire, MD;  Location: [facility];  Service: Cardiovascular;  Laterality: N/A;  . LEFT HEART CATH AND CORONARY ANGIOGRAPHY N/A 05/16/2018   Procedure: LEFT HEART CATH AND CORONARY ANGIOGRAPHY;  Surgeon: Wellington Hampshire, MD;  Location: [facility];  Service: Cardiovascular;  Laterality: N/A;  . PERICARDIOCENTESIS      Family History  Problem Relation Age of Onset  . Hypertension Mother   . Pancreatic cancer Father   . Diabetes Brother   . Breast cancer Neg Hx     Social History   Socioeconomic History  . Marital status: Widowed    Spouse name: Not on file  . Number of children: Not on file  . Years of education: Not on file  . Highest education level: Not on file  Occupational History  . Not on file  Social Needs  . Financial resource strain: Very hard  . Food insecurity    Worry: Often true    Inability: Often true  . Transportation needs    Medical: No    Non-medical: No  Tobacco Use  . Smoking status: Never Smoker  . Smokeless tobacco: Never Used  Substance and Sexual Activity  . Alcohol use: Not Currently  . Drug use: Never  . Sexual activity:  Not on file  Lifestyle  . Physical activity    Days per week: 3 days    Minutes per session: 60 min  . Stress: Only a little  Relationships  . Social connections    Talks on phone: More than three times a week    Gets together: More than three times a week    Attends religious service: More than 4 times per year    Active member of club or organization: Yes    Attends meetings of clubs or organizations: Never    Relationship status: Widowed  . Intimate partner violence    Fear of current or ex partner: No    Emotionally abused: No    Physically abused: No    Forced sexual activity: No   Other Topics Concern  . Not on file  Social History Narrative  . Not on file    Outpatient Medications Prior to Visit  Medication Sig Dispense Refill  . CALCIUM CITRATE-VITAMIN D PO Take 1 tablet by mouth 2 (two) times daily.    Marland Kitchen losartan (COZAAR) 100 MG tablet TAKE ONE TABLET BY MOUTH EVERY DAY. TAKE WITH HCTZ 90 tablet 3  . Multiple Vitamin (MULTIVITAMIN) tablet Take 1 tablet by mouth daily.    Marland Kitchen aspirin EC 81 MG tablet TAKE ONE TABLET BY MOUTH EVERY DAY 30 tablet 0  . rosuvastatin (CRESTOR) 40 MG tablet TAKE ONE TABLET BY MOUTH EVERY DAY 90 tablet 0  . ticagrelor (BRILINTA) 90 MG TABS tablet Take 1 tablet (90 mg total) by mouth 2 (two) times daily. 32 tablet 0   No facility-administered medications prior to visit.     Allergies  Allergen Reactions  . Amoxicillin Itching and Other (See Comments)    Reaction: bump in vaginal region Has patient had a PCN reaction causing immediate rash, facial/tongue/throat swelling, SOB or lightheadedness with hypotension: No Has patient had a PCN reaction causing severe rash involving mucus membranes or skin necrosis: No Has patient had a PCN reaction that required hospitalization: No Has patient had a PCN reaction occurring within the last 10 years: No If all of the above answers are "NO", then may proceed with Cephalosporin use.     ROS Review of Systems  Constitutional: Negative.   Respiratory: Negative.   Cardiovascular: Positive for leg swelling (lymphedema to left leg).  Gastrointestinal: Negative.   Genitourinary: Negative.   Skin: Negative.   Neurological: Negative.   Hematological: Bruises/bleeds easily.  Psychiatric/Behavioral: Negative.       Objective:    Physical Exam  Constitutional: She is oriented to person, place, and time. She appears well-developed and well-nourished.  HENT:  Head: Normocephalic and atraumatic.  Eyes: Pupils are equal, round, and reactive to light. EOM are normal.  Neck: Normal range of  motion.  Cardiovascular: Normal rate and regular rhythm.  Pulmonary/Chest: Effort normal and breath sounds normal.  Abdominal: Soft. Bowel sounds are normal.  Musculoskeletal: Normal range of motion.        General: Edema (lymphedema to LLE) present.  Neurological: She is alert and oriented to person, place, and time.  Skin: Skin is warm and dry.  Psychiatric: She has a normal mood and affect. Her behavior is normal. Judgment and thought content normal.    BP 124/77 (BP Location: Left Arm, Patient Position: Sitting)   Pulse 71   Temp (!) 97.4 F (36.3 C)   Ht 5' 2" (1.575 m)   Wt 217 lb (98.4 kg)   SpO2 100%   BMI  39.69 kg/m  Wt Readings from Last 3 Encounters:  07/21/19 217 lb (98.4 kg)  06/16/19 190 lb (86.2 kg)  05/13/19 212 lb (96.2 kg)   She was advised to continue on weight loss regimen.  Health Maintenance Due  Topic Date Due  . Hepatitis C Screening  [birth year]  . HIV Screening  07/15/1974  . TETANUS/TDAP  07/15/1978  . PAP SMEAR-Modifier  07/15/1980  . COLONOSCOPY  07/15/2009  . INFLUENZA VACCINE  06/20/2019    There are no preventive care reminders to display for this patient.  Lab Results  Component Value Date   TSH 1.350 11/20/2018   Lab Results  Component Value Date   WBC 5.1 11/20/2018   HGB 11.9 11/20/2018   HCT 37.0 11/20/2018   MCV 83 11/20/2018   PLT 244 11/20/2018   Lab Results  Component Value Date   NA 143 07/15/2019   K 3.5 07/15/2019   CO2 26 07/15/2019   GLUCOSE 85 07/15/2019   BUN 20 07/15/2019   CREATININE 1.21 (H) 07/15/2019   BILITOT 0.8 07/15/2019   ALKPHOS 60 07/15/2019   AST 25 07/15/2019   ALT 16 07/15/2019   PROT 7.5 07/15/2019   ALBUMIN 4.1 07/15/2019   CALCIUM 10.0 07/15/2019   ANIONGAP 7 06/30/2018   Lab Results  Component Value Date   CHOL 136 05/06/2019   Lab Results  Component Value Date   HDL 44 05/06/2019   Lab Results  Component Value Date   LDLCALC 76 05/06/2019   Lab Results  Component Value  Date   TRIG 81 05/06/2019   Lab Results  Component Value Date   CHOLHDL 3.1 05/06/2019   Lab Results  Component Value Date   HGBA1C 6.0 (H) 05/06/2019      Assessment & Plan:    1. Lymphedema of left leg - She was encouraged to follow up with Dr Delana Meyer G at [facility] on 08/06/2019. --Elevate your legs up above heart level while resting - Wear compression stockings if standing or walking for a while -Reduce sodium intake and limit fluid intake -Exercise daily as tolerated - chlorthalidone (HYGROTON) 25 MG tablet; Take 0.5 tablets (12.5 mg total) by mouth daily.  Dispense: 30 tablet; Refill: 0 - potassium chloride (K-DUR) 10 MEQ tablet; Take 2 tablets (20 mEq total) by mouth daily.  Dispense: 60 tablet; Refill: 2  2. Essential hypertension - Her blood pressure is controlled and was advised to continue on current treatment regimen. -Low salt DASH diet -Take medications regularly on time -Exercise regularly as tolerated -Check blood pressure daily at home, record and bring log to office visit. -Goal is less than 140/90 and normal blood pressure is 120/80 - Blood Pressure KIT; 1 kit by Does not apply route daily.  Dispense: 1 kit; Refill: 0  3. Abnormal laboratory test result - She was advised to increase fluid intake and will recheck BMP in few weeks.  4. Health care maintenance - She was encouraged to complete charity care application for - Ambulatory referral to Gastroenterology for colonoscopy screening.  5. Coronary artery disease involving native heart without angina pectoris, unspecified vessel or lesion type - She will continue on current treatment regimen and will follow up with Cardiologist Dr Fletcher Anon on 08/20/2019. - aspirin EC 81 MG tablet; Take 1 tablet (81 mg total) by mouth daily.  Dispense: 30 tablet; Refill: 2 - rosuvastatin (CRESTOR) 40 MG tablet; Take 1 tablet (40 mg total) by mouth daily.  Dispense: 90 tablet; Refill:  2 - ticagrelor (BRILINTA)  90 MG TABS tablet; Take 1 tablet (90 mg total) by mouth 2 (two) times daily.  Dispense: 64 tablet; Refill: 2    Follow-up: Return in about 20 days (around 08/10/2019), or if symptoms worsen or fail to improve.    Chioma Jerold Coombe, NP

## 2019-07-21 NOTE — Patient Instructions (Signed)

## 2019-07-22 IMAGING — MG DIGITAL SCREENING BILATERAL MAMMOGRAM WITH TOMO AND CAD
8 series · 8 of 24 positions shown · non-contrast
Comparison: Previous exam(s).

CLINICAL DATA: Screening.

EXAM:
DIGITAL SCREENING BILATERAL MAMMOGRAM WITH TOMO AND CAD

[L MLO synth-2D]
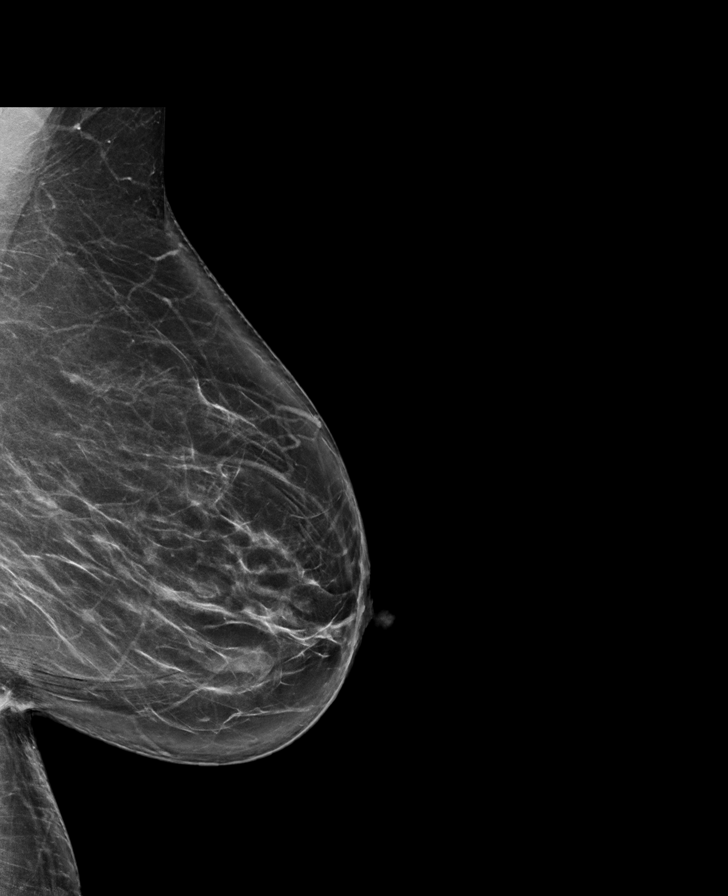

[R MLO synth-2D]
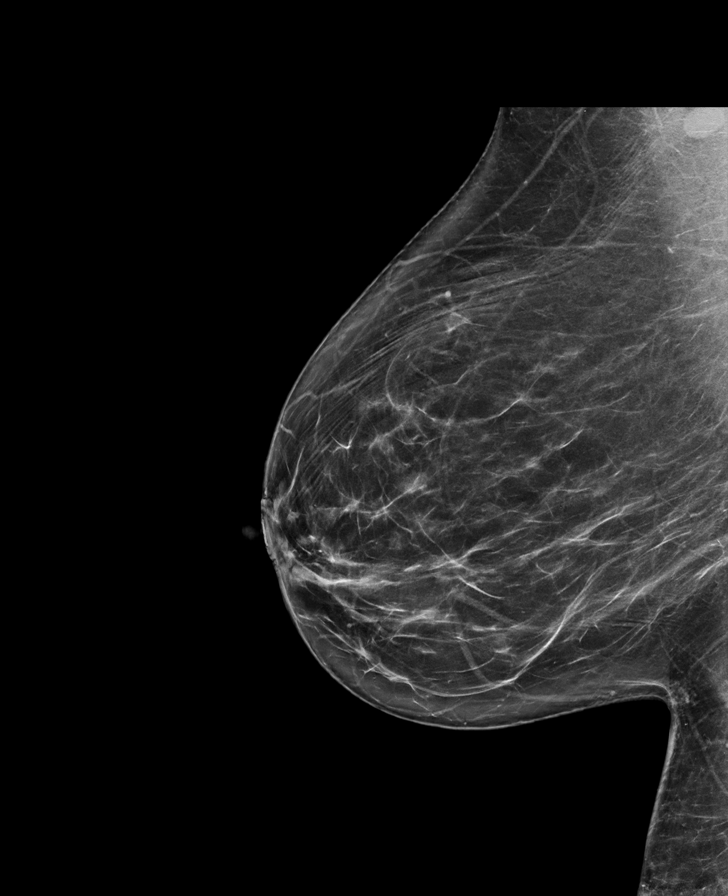

[L CC synth-2D]
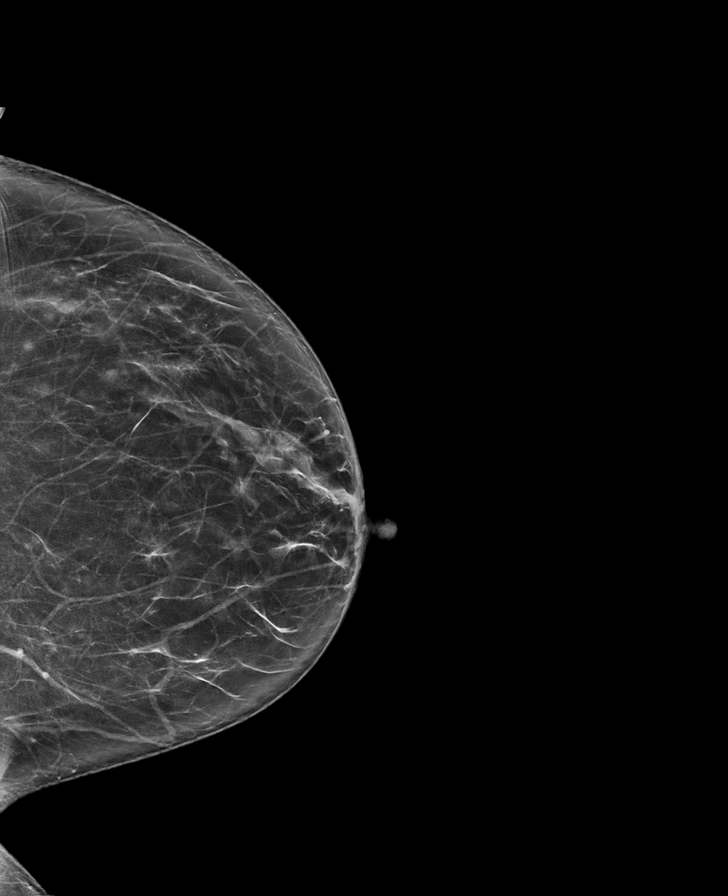

[R CC synth-2D]
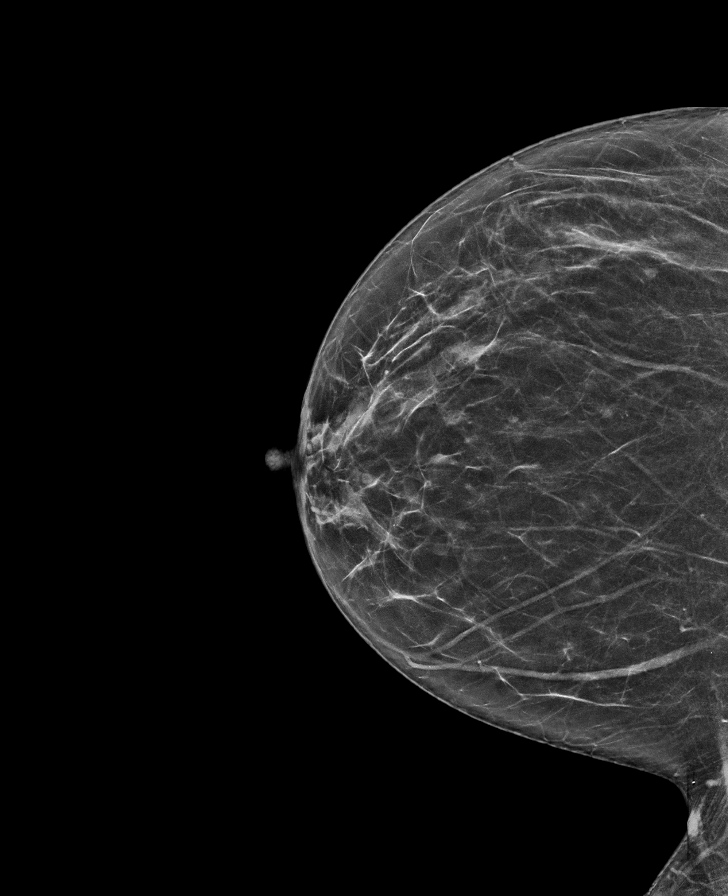

[L CC tomo · tomo slice 35/68.0]
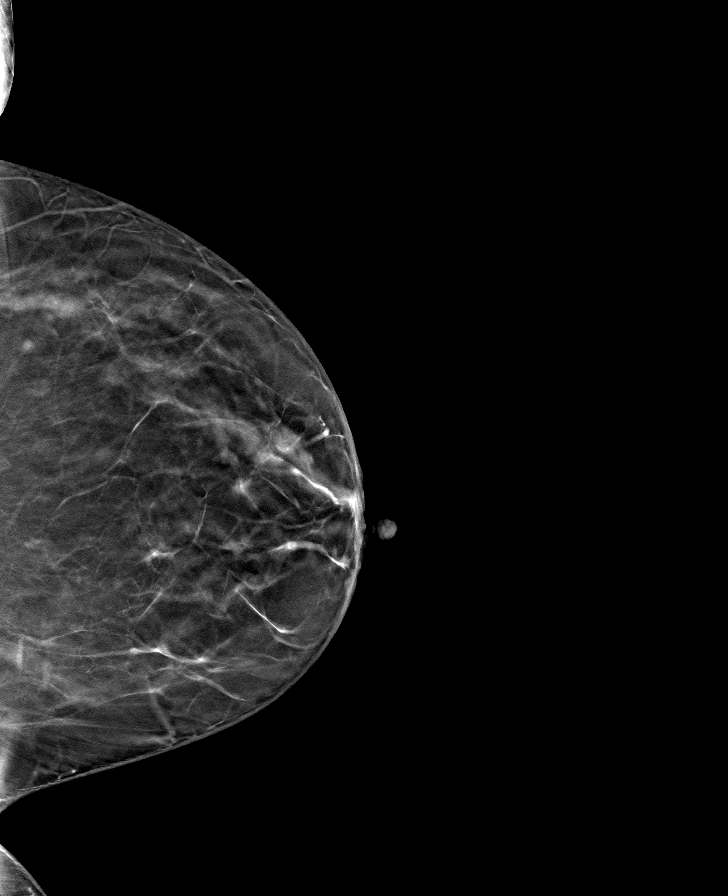

[R CC tomo · tomo slice 33/65.0]
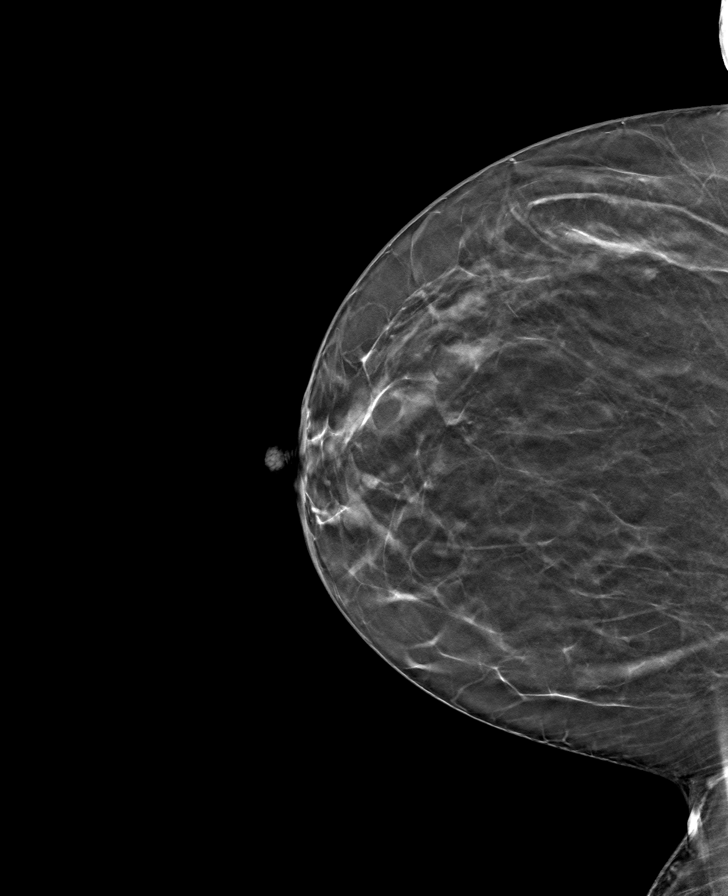

[R MLO tomo · tomo slice 41/80.0]
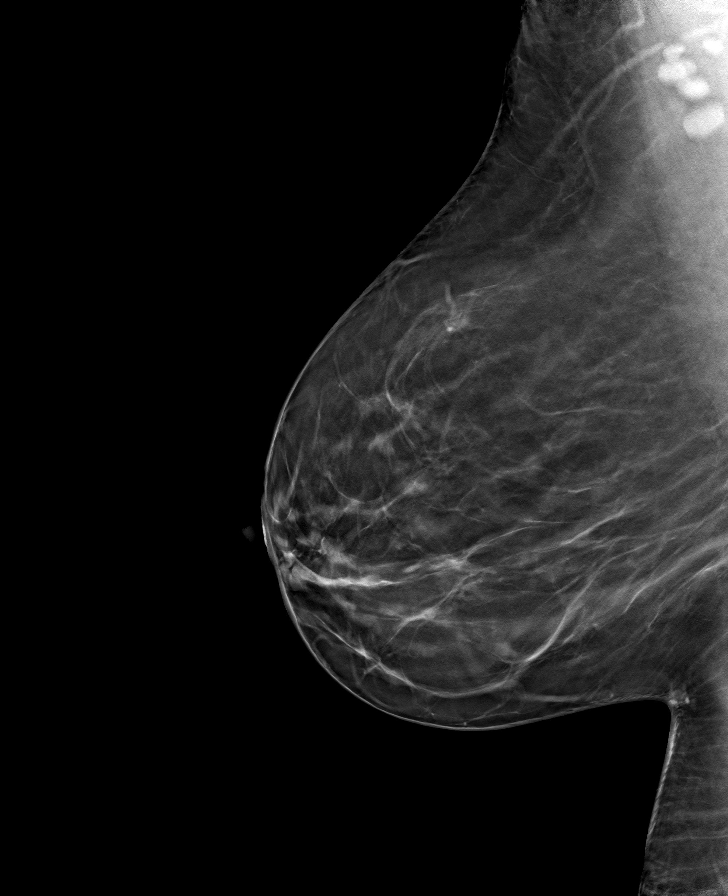

[L MLO tomo · tomo slice 41/80.0]
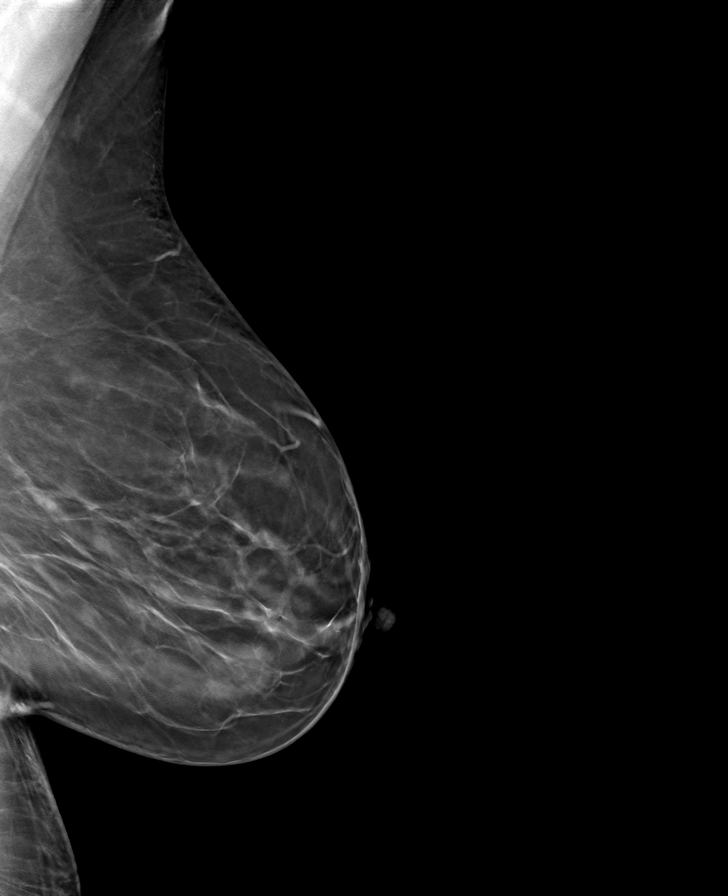

[8 of 24 positions shown; findings below may reference images not displayed]

ACR Breast Density Category b: There are scattered areas of
fibroglandular density.
FINDINGS: There are no findings suspicious for malignancy. Images were
processed with CAD.
IMPRESSION: No mammographic evidence of malignancy. A result letter of this
screening mammogram will be mailed directly to the patient.

RECOMMENDATION:
Screening mammogram in one year. (Code:[id number])

BI-RADS CATEGORY  1: Negative.

## 2019-07-28 ENCOUNTER — Other Ambulatory Visit: Payer: Self-pay

## 2019-07-28 ENCOUNTER — Telehealth: Payer: Self-pay | Admitting: Gastroenterology

## 2019-07-28 DIAGNOSIS — Z1211 Encounter for screening for malignant neoplasm of colon: Secondary | ICD-10-CM

## 2019-07-28 MED ORDER — NA SULFATE-K SULFATE-MG SULF 17.5-3.13-1.6 GM/177ML PO SOLN: 1.0000 | Freq: Once | ORAL | 0 refills | Status: AC

## 2019-07-28 NOTE — Telephone Encounter (Signed)
Pt left vm to schedule colonoscopy  °

## 2019-07-28 NOTE — Telephone Encounter (Signed)
Gastroenterology Pre-Procedure Review  Request Date: 08/12/19 Requesting Physician: Dr. Bonna Gains   PATIENT REVIEW QUESTIONS: The patient responded to the following health history questions as indicated:    Discuss Anesthesia Concerns Prior to Colonoscopy with Patient  1. Are you having any GI issues? no 2. Do you have a personal history of Polyps? no 3. Do you have a family history of Colon Cancer or Polyps? no 4. Diabetes Mellitus? no 5. Joint replacements in the past 12 months?no 6. Major health problems in the past 3 months?no 7. Any artificial heart valves, MVP, or defibrillator?June 2019 heart attack patient takes brilinta blood thinner request sent to Dr. Tyrell Antonio office    MEDICATIONS & ALLERGIES:    Patient reports the following regarding taking any anticoagulation/antiplatelet therapy:   Plavix, Coumadin, Eliquis, Xarelto, Lovenox, Pradaxa, Brilinta, or Effient? yes (Brilinta) Aspirin? yes (68m daily)  Patient confirms/reports the following medications:  Current Outpatient Medications  Medication Sig Dispense Refill  . aspirin EC 81 MG tablet Take 1 tablet (81 mg total) by mouth daily. 30 tablet 2  . Blood Pressure KIT 1 kit by Does not apply route daily. 1 kit 0  . CALCIUM CITRATE-VITAMIN D PO Take 1 tablet by mouth 2 (two) times daily.    . chlorthalidone (HYGROTON) 25 MG tablet Take 0.5 tablets (12.5 mg total) by mouth daily. 30 tablet 0  . losartan (COZAAR) 100 MG tablet TAKE ONE TABLET BY MOUTH EVERY DAY. TAKE WITH HCTZ 90 tablet 3  . Multiple Vitamin (MULTIVITAMIN) tablet Take 1 tablet by mouth daily.    . Na Sulfate-K Sulfate-Mg Sulf 17.5-3.13-1.6 GM/177ML SOLN Take 1 kit by mouth once for 1 dose. 354 mL 0  . potassium chloride (K-DUR) 10 MEQ tablet Take 2 tablets (20 mEq total) by mouth daily. 60 tablet 2  . rosuvastatin (CRESTOR) 40 MG tablet Take 1 tablet (40 mg total) by mouth daily. 90 tablet 2  . ticagrelor (BRILINTA) 90 MG TABS tablet Take 1 tablet (90 mg  total) by mouth 2 (two) times daily. 64 tablet 2   No current facility-administered medications for this visit.     Patient confirms/reports the following allergies:  Allergies  Allergen Reactions  . Amoxicillin Itching and Other (See Comments)    Reaction: bump in vaginal region Has patient had a PCN reaction causing immediate rash, facial/tongue/throat swelling, SOB or lightheadedness with hypotension: No Has patient had a PCN reaction causing severe rash involving mucus membranes or skin necrosis: No Has patient had a PCN reaction that required hospitalization: No Has patient had a PCN reaction occurring within the last 10 years: No If all of the above answers are "NO", then may proceed with Cephalosporin use.     No orders of the defined types were placed in this encounter.   AUTHORIZATION INFORMATION Primary Insurance: 1D#: Group #:  Secondary Insurance: 1D#: Group #:  SCHEDULE INFORMATION: Date: 08/12/19 Time: Location:[facility]

## 2019-07-30 ENCOUNTER — Other Ambulatory Visit: Payer: Self-pay

## 2019-07-30 DIAGNOSIS — Z1211 Encounter for screening for malignant neoplasm of colon: Secondary | ICD-10-CM

## 2019-07-31 NOTE — Progress Notes (Signed)
I was planning to stop her Brilinta this month anyway given that stent placement was more than a year ago. Brilinta can be stopped and no need to resume.  Continue aspirin 81 mg once daily without interruption.

## 2019-08-06 ENCOUNTER — Telehealth: Payer: Self-pay | Admitting: Pharmacist

## 2019-08-06 ENCOUNTER — Encounter (INDEPENDENT_AMBULATORY_CARE_PROVIDER_SITE_OTHER): Payer: Medicaid Other | Admitting: Vascular Surgery

## 2019-08-06 NOTE — Telephone Encounter (Signed)
08/06/2019 3:08:08 PM - Brilinta renewal faxed to [facility]  08/06/2019 Faxed [place] renewal application for Brilinta 90mg  Take 1 tablet by mouth 2 times a day.Delos Haring

## 2019-08-07 ENCOUNTER — Telehealth: Payer: Self-pay | Admitting: Cardiovascular Disease

## 2019-08-07 NOTE — Telephone Encounter (Signed)
Per chart review and note by Dr. Fletcher Anon on 07/28/2019, Brilinta was stopped when that her PCI placement was greater than 1 year prior.  She was continued on ASA 81 mg once daily without interruptions.  Can we find out if the requesting team needs ASA to be held for the colonoscopy?  Can we also take Brilinta off of her medication list if confirmed that she has not been taking.  Please see phone note from 07/28/2019  Thank you  Sharee Pimple

## 2019-08-07 NOTE — Telephone Encounter (Signed)
I called surgeon's office and left message in regards to medication to be held for colonoscopy on 08/12/19. Surgery clearance fax states to hold her Brilinta. Pt is no longer on Brilinta and is only on ASA 81 mg daily; per Pre Op Provider today asked if ASA needs to be held for colonoscopy. I have left message to please call the office back to let us know if ASA to be held and how long. I will update med list to reflect pt on ASA 81 mg daily and no longer on Brilinta.

## 2019-08-07 NOTE — Telephone Encounter (Signed)
   [facility] Pre-operative Risk Assessment    Request for surgical clearance:  1. What type of surgery is being performed? COLONOSCOPY  2. When is this surgery scheduled? 08/12/19  3. What type of clearance is required (medical clearance vs. Pharmacy clearance to hold med vs. Both)? BOTH  Are there any medications that need to be held prior to surgery and how long? BRILINTA 4. Practice name and name of physician performing surgery? [facility], DR TAHILIANI   5. What is your office phone number [phone]   7.   What is your office fax number [phone]  8.   Anesthesia type (None, local, MAC, general) ? NOT LISTED   Kelsey Terry 08/07/2019, 11:41 AM  _________________________________________________________________   (provider comments below)

## 2019-08-10 ENCOUNTER — Telehealth: Payer: Self-pay

## 2019-08-10 ENCOUNTER — Telehealth: Payer: Self-pay | Admitting: Gastroenterology

## 2019-08-10 ENCOUNTER — Ambulatory Visit: Payer: Medicaid Other | Admitting: Gerontology

## 2019-08-10 ENCOUNTER — Telehealth: Payer: Self-pay | Admitting: Cardiovascular Disease

## 2019-08-10 ENCOUNTER — Other Ambulatory Visit: Payer: Self-pay

## 2019-08-10 ENCOUNTER — Other Ambulatory Visit
Admission: RE | Admit: 2019-08-10 | Discharge: 2019-08-10 | Disposition: A | Discharge status: Home / Self Care | Payer: Medicaid Other | Source: Ambulatory Visit | Attending: Gastroenterology | Admitting: Gastroenterology

## 2019-08-10 DIAGNOSIS — Z01812 Encounter for preprocedural laboratory examination: Secondary | ICD-10-CM | POA: Diagnosis present

## 2019-08-10 DIAGNOSIS — Z20828 Contact with and (suspected) exposure to other viral communicable diseases: Secondary | ICD-10-CM | POA: Diagnosis not present

## 2019-08-10 LAB — SARS CORONAVIRUS 2 (TAT 6-24 HRS): SARS Coronavirus 2: NEGATIVE

## 2019-08-10 MED ORDER — GOLYTELY 236 G PO SOLR: ORAL | 0 refills | Status: DC

## 2019-08-10 NOTE — Telephone Encounter (Signed)
Patient has a colonoscopy scheduled for Thursday 08-13-19 & has question on what she can eat. Has instructions  For procedure. Please call today.

## 2019-08-10 NOTE — Telephone Encounter (Signed)
Returned call back to patient LVM to let her know her call has been received and to call the office back leave a message with front office and they will notify me of her call back.  Thanks [employer]

## 2019-08-10 NOTE — Telephone Encounter (Signed)
LVM to address patients colonoscopy questions and concerns.  Please make her aware that my phone number will show up as unlisted or private.  If she would like to tell you her specific questions I will answer them and send message back to you to convey.  Thanks [employer]

## 2019-08-10 NOTE — Telephone Encounter (Signed)
Dr. Rogue Jury Approved Cardiac clearance for patient to have Colonoscopy.

## 2019-08-10 NOTE — Telephone Encounter (Signed)
Pt c/o medication issue:  1. Name of Medication: Brilinta   2. How are you currently taking this medication (dosage and times per day)? 90 mg po BID   3. Are you having a reaction (difficulty breathing--STAT)?  no  4. What is your medication issue? See note from 9/8 Dr. Fletcher Anon and recent clearance request.  Patient states she never was told to [facility].  Please call.

## 2019-08-10 NOTE — Telephone Encounter (Signed)
Patient returning phone call, please advise when able

## 2019-08-10 NOTE — Telephone Encounter (Signed)
Returned the patient's call to discuss Dr. Tyrell Antonio recommendation below.    Wellington Hampshire, MD at 07/28/2019 1:31 PM  Status: Signed    I was planning to stop her Brilinta this month anyway given that stent placement was more than a year ago. Brilinta can be stopped and no need to resume.  Continue aspirin 81 mg once daily without interruption.

## 2019-08-10 NOTE — Telephone Encounter (Signed)
Pt left vm for Sharyn Lull she set up her setting were she can receive Private calls now please call pt back

## 2019-08-10 NOTE — Telephone Encounter (Signed)
Arbie Cookey from [facility] left vm regarding cardiac Clearance pt is not on rx Balenta only baby Asprine 81mg  they would like call to see if pt needs to be off on Asprine cb [phone]

## 2019-08-10 NOTE — Telephone Encounter (Signed)
   Primary Cardiologist: Kathlyn Sacramento, MD  Chart reviewed as part of pre-operative protocol coverage. Patient was contacted 08/10/2019 in reference to pre-operative risk assessment for pending surgery as outlined below.  Kelsey Terry was last seen on 02/12/2019 via virtual visit by Dr. Fletcher Anon.  Since that day, Kelsey Terry has done well without any chest pain or shortness of breath.  Therefore, based on ACC/AHA guidelines, the patient would be at acceptable risk for the planned procedure without further cardiovascular testing.   I will route this recommendation to the requesting party via Epic fax function and remove from pre-op pool.  Please call with questions.  Patient was nervous about the procedure, she had confusion and AMS after her sedation for cath in 2019. Looking back, previous note mentioned she received fentanyl for headache and developed confusion. Neurology workup was negative.   [place], [place] 08/10/2019, 2:29 PM

## 2019-08-10 NOTE — Telephone Encounter (Addendum)
Patient made aware of Dr. Tyrell Antonio recommendation to d/c Brilinta and continua Asa 81mg  qd without interruption. Patient's medication list updated. Patient verbalized understanding and voiced appreciation for the call.

## 2019-08-10 NOTE — Telephone Encounter (Signed)
Spoke with pt regarding message I received from Dr. Tyrell Antonio office. Informed pt message stated she was no longer taking Brilinta. Pt stated that was not true. She was taking it and actually just received a refill. I informed pt she needs to contact Dr. Tyrell Antonio office about this. Pt stated she had stopped the Brilinta per out instructions.   I have contacted Dr. Tyrell Antonio office and left a message regarding the information above.

## 2019-08-11 ENCOUNTER — Telehealth: Payer: Self-pay | Admitting: Gastroenterology

## 2019-08-11 NOTE — Telephone Encounter (Signed)
Pt  Is calling to speak with Sharyn Lull  To discuss her instructions.

## 2019-08-11 NOTE — Telephone Encounter (Signed)
Patient has been notified her COVID test is Negative.  She inquired if she could have gravy today, I advised that she could enjoy her gravy today but tomorrow should be only clear liquids. I will print a copy of her COVID test results and place in the mail for her to have in her records.  Thanks [employer]

## 2019-08-11 NOTE — Telephone Encounter (Signed)
Patient called &  would like her results from the covid-19 test.

## 2019-08-11 NOTE — Telephone Encounter (Signed)
Returned patients call.  We reviewed colonoscopy food guidelines.  Pt said she is a little nervous about having her colonoscopy.  Informed her that she can definitely give me a call back if she needs me to discuss additional concerns regarding her colonoscopy.  Thanks [employer]

## 2019-08-12 ENCOUNTER — Ambulatory Visit: Payer: Medicaid Other | Admitting: Gerontology

## 2019-08-12 ENCOUNTER — Other Ambulatory Visit: Payer: Self-pay

## 2019-08-12 ENCOUNTER — Ambulatory Visit: Admit: 2019-08-12 | Payer: MEDICAID | Admitting: Gastroenterology

## 2019-08-12 DIAGNOSIS — I10 Essential (primary) hypertension: Secondary | ICD-10-CM

## 2019-08-12 DIAGNOSIS — I890 Lymphedema, not elsewhere classified: Secondary | ICD-10-CM

## 2019-08-12 DIAGNOSIS — R899 Unspecified abnormal finding in specimens from other organs, systems and tissues: Secondary | ICD-10-CM

## 2019-08-12 SURGERY — COLONOSCOPY WITH PROPOFOL
Anesthesia: General

## 2019-08-12 MED ORDER — CHLORTHALIDONE 25 MG PO TABS: 12.5000 mg | ORAL_TABLET | Freq: Every day | ORAL | 0 refills | Status: DC

## 2019-08-12 NOTE — Progress Notes (Signed)
Established Patient Office Visit  Subjective:  Patient ID: Kelsey Terry, female    DOB: Jun 27, 1961  Age: 58 y.o. MRN: [id number]  CC: No chief complaint on file. Patient consents to telephone visit and 2 patient identifiers was used to identify patient.  HPI Kelsey Terry presents for follow up of hypertension, Lymphedema to left leg and lab review. She reports that she's compliant with her medications, checks her blood pressure at home and states that it's always below 130/90. She was advised by Dr Rod Can to stop Brilinta and continue on 81 mg Aspirin daily. She denies chest pain, palpitation and light headedness. She continues to take 12.5 mg chlorthalidone due to chronic left leg Lymphedema, and she reports that the swelling has decreased 50%. Her serum creatinine done 4 weeks ago increased from 1.08 to 1.47m/dl and her eGFR for AA decreased from 65 to 57 mg/dl. She will follow up with Vascular Surgery on 08/27/2019 for evaluation of her left leg Lymphedema. She has Colonoscopy scheduled for tomorrow with Dr WAllen Norris She states that she's doing well and offers no further concern.  Past Medical History:  Diagnosis Date  . Allergies   . Asthma   . Blood dyscrasia    blood clot in right leg  . CAD (coronary artery disease) 05/05/2019  . CAD in native artery    a. [place] ost-pLAD 95% s/p PCI/DES, mLAD 30%, ostD1 70%, ost-pLCx 30%, mRCA 90%, dRCA 20%; [facility]  . Complication of anesthesia    patient stated she had "some kind of reaction to anesthesia, "thought she was having a stroke" during her second cath procedure.  . Diastolic dysfunction    a. TTE 05/16/18: EF 55-60%, no RWMA, Gr1DD, trivial AI, calcified mitral annulus, mild MR, mildly dilated LA  . Dyspnea    with activity  . Hypertension   . Lymphedema    LLE  . Pericarditis    [place]   . Wears contact lenses     Past Surgical History:   Procedure Laterality Date  . ABDOMINAL HYSTERECTOMY    . CESAREAN SECTION    . CORONARY STENT INTERVENTION N/A 05/16/2018   Procedure: CORONARY STENT INTERVENTION;  Surgeon: AWellington Hampshire MD;  [facility];  Service: Cardiovascular;  Laterality: N/A;  . CORONARY STENT INTERVENTION N/A 05/19/2018   Procedure: CORONARY STENT INTERVENTION;  Surgeon: AWellington Hampshire MD;  [facility];  Service: Cardiovascular;  Laterality: N/A;  . LEFT HEART CATH AND CORONARY ANGIOGRAPHY N/A 05/16/2018   Procedure: LEFT HEART CATH AND CORONARY ANGIOGRAPHY;  Surgeon: AWellington Hampshire MD;  [facility];  Service: Cardiovascular;  Laterality: N/A;  . PERICARDIOCENTESIS      Family History  Problem Relation Age of Onset  . Hypertension Mother   . Pancreatic cancer Father   . Diabetes Brother   . Breast cancer Neg Hx     Social History   Socioeconomic History  . Marital status: Widowed    Spouse name: Not on file  . Number of children: Not on file  . Years of education: Not on file  . Highest education level: Not on file  Occupational History  . Not on file  Social Needs  . Financial resource strain: Very hard  . Food insecurity    Worry: Often true    Inability: Often true  . Transportation needs  Medical: No    Non-medical: No  Tobacco Use  . Smoking status: Never Smoker  . Smokeless tobacco: Never Used  Substance and Sexual Activity  . Alcohol use: Not Currently  . Drug use: Never  . Sexual activity: Not on file  Lifestyle  . Physical activity    Days per week: 3 days    Minutes per session: 60 min  . Stress: Only a little  Relationships  . Social connections    Talks on phone: More than three times a week    Gets together: More than three times a week    Attends religious service: More than 4 times per year    Active member of club or organization: Yes    Attends meetings of clubs or organizations: Never     Relationship status: Widowed  . Intimate partner violence    Fear of current or ex partner: No    Emotionally abused: No    Physically abused: No    Forced sexual activity: No  Other Topics Concern  . Not on file  Social History Narrative  . Not on file    Outpatient Medications Prior to Visit  Medication Sig Dispense Refill  . aspirin EC 81 MG tablet Take 1 tablet (81 mg total) by mouth daily. 30 tablet 2  . Blood Pressure KIT 1 kit by Does not apply route daily. 1 kit 0  . CALCIUM CITRATE-VITAMIN D PO Take 1 tablet by mouth 2 (two) times daily.    Marland Kitchen losartan (COZAAR) 100 MG tablet TAKE ONE TABLET BY MOUTH EVERY DAY. TAKE WITH HCTZ 90 tablet 3  . Multiple Vitamin (MULTIVITAMIN) tablet Take 1 tablet by mouth daily.    . polyethylene glycol (GOLYTELY) 236 g solution Drink one 8 oz glass every 20 mins until entire container is finished starting at 5:00pm on 08/12/19 4000 mL 0  . potassium chloride (K-DUR) 10 MEQ tablet Take 2 tablets (20 mEq total) by mouth daily. 60 tablet 2  . rosuvastatin (CRESTOR) 40 MG tablet Take 1 tablet (40 mg total) by mouth daily. 90 tablet 2  . chlorthalidone (HYGROTON) 25 MG tablet Take 0.5 tablets (12.5 mg total) by mouth daily. 30 tablet 0   No facility-administered medications prior to visit.     Allergies  Allergen Reactions  . Amoxicillin Itching and Other (See Comments)    Reaction: bump in vaginal region Has patient had a PCN reaction causing immediate rash, facial/tongue/throat swelling, SOB or lightheadedness with hypotension: No Has patient had a PCN reaction causing severe rash involving mucus membranes or skin necrosis: No Has patient had a PCN reaction that required hospitalization: No Has patient had a PCN reaction occurring within the last 10 years: No If all of the above answers are "NO", then may proceed with Cephalosporin use.     ROS Review of Systems  Constitutional: Negative.   Respiratory: Negative.   Cardiovascular: Positive  for leg swelling (Lymphedema to left leg).  Gastrointestinal: Negative.   Genitourinary: Negative.   Skin: Negative.   Neurological: Negative.   Psychiatric/Behavioral: Negative.       Objective:    Physical Exam No vital sign or PE was done. There were no vitals taken for this visit. Wt Readings from Last 3 Encounters:  07/21/19 217 lb (98.4 kg)  06/16/19 190 lb (86.2 kg)  05/13/19 212 lb (96.2 kg)     Health Maintenance Due  Topic Date Due  . Hepatitis C Screening  1961-06-17  . HIV Screening  07/15/1974  . TETANUS/TDAP  07/15/1978  . PAP SMEAR-Modifier  07/15/1980  . COLONOSCOPY  07/15/2009  . INFLUENZA VACCINE  06/20/2019    There are no preventive care reminders to display for this patient.  Lab Results  Component Value Date   TSH 1.350 11/20/2018   Lab Results  Component Value Date   WBC 5.1 11/20/2018   HGB 11.9 11/20/2018   HCT 37.0 11/20/2018   MCV 83 11/20/2018   PLT 244 11/20/2018   Lab Results  Component Value Date   NA 143 07/15/2019   K 3.5 07/15/2019   CO2 26 07/15/2019   GLUCOSE 85 07/15/2019   BUN 20 07/15/2019   CREATININE 1.21 (H) 07/15/2019   BILITOT 0.8 07/15/2019   ALKPHOS 60 07/15/2019   AST 25 07/15/2019   ALT 16 07/15/2019   PROT 7.5 07/15/2019   ALBUMIN 4.1 07/15/2019   CALCIUM 10.0 07/15/2019   ANIONGAP 7 06/30/2018   Lab Results  Component Value Date   CHOL 136 05/06/2019   Lab Results  Component Value Date   HDL 44 05/06/2019   Lab Results  Component Value Date   LDLCALC 76 05/06/2019   Lab Results  Component Value Date   TRIG 81 05/06/2019   Lab Results  Component Value Date   CHOLHDL 3.1 05/06/2019   Lab Results  Component Value Date   HGBA1C 6.0 (H) 05/06/2019      Assessment & Plan:     1. Lymphedema of left leg -She was advised to Elevate legs up above heart level while resting - Wear compression stockings if standing or walking for a while -Reduce sodium intake and limit fluid  intake -Exercise daily as tolerated. - Follow up with Vascular Surgery Dr Eloise Levels on 08/27/2019 - chlorthalidone (HYGROTON) 25 MG tablet; Take 0.5 tablets (12.5 mg total) by mouth daily. Take 12.33m every other day  Dispense: 30 tablet; Refill: 0   2. Abnormal laboratory test result - Her Serum creatinine was 1.21 mg/dl, she was advised to take 12.5 mg chlorthalidone every other day, and increase fluid intake. Will recheck lab - Comp Met (CMET); Future and  - Ambulatory referral to Nephrology  3. Essential hypertension - Her blood pressure is controlled and she will continue on current treatment regimen. -Low salt DASH diet -Take medications regularly on time -Exercise regularly as tolerated -Check blood pressure at least once a week at home, record and bring log to clinic -Goal is less than 140/90 and normal blood pressure is 120/80 - She will follow up with Cardiologist Dr ARod Canon 08/20/2019. - chlorthalidone (HYGROTON) 25 MG tablet; Take 0.5 tablets (12.5 mg total) by mouth daily. Take 12.521mevery other day  Dispense: 30 tablet; Refill: 0 - Comp Met (CMET); Future    Follow-up: Return in about 3 weeks (around 09/02/2019), or if symptoms worsen or fail to improve.    Chioma E Jerold CoombeNP

## 2019-08-12 NOTE — Discharge Instructions (Signed)

## 2019-08-12 NOTE — Patient Instructions (Signed)

## 2019-08-13 ENCOUNTER — Ambulatory Visit: Payer: Self-pay | Admitting: Anesthesiology

## 2019-08-13 ENCOUNTER — Encounter
Admission: RE | Disposition: A | Discharge status: Home / Self Care | Payer: Self-pay | Source: Home / Self Care | Attending: Gastroenterology

## 2019-08-13 ENCOUNTER — Other Ambulatory Visit: Payer: Self-pay

## 2019-08-13 ENCOUNTER — Ambulatory Visit
Admission: RE | Admit: 2019-08-13 | Discharge: 2019-08-13 | Disposition: A | Discharge status: Home / Self Care | Payer: Self-pay | Attending: Gastroenterology | Admitting: Gastroenterology

## 2019-08-13 DIAGNOSIS — I252 Old myocardial infarction: Secondary | ICD-10-CM | POA: Insufficient documentation

## 2019-08-13 DIAGNOSIS — K641 Second degree hemorrhoids: Secondary | ICD-10-CM | POA: Insufficient documentation

## 2019-08-13 DIAGNOSIS — Z955 Presence of coronary angioplasty implant and graft: Secondary | ICD-10-CM | POA: Insufficient documentation

## 2019-08-13 DIAGNOSIS — K5730 Diverticulosis of large intestine without perforation or abscess without bleeding: Secondary | ICD-10-CM | POA: Insufficient documentation

## 2019-08-13 DIAGNOSIS — D125 Benign neoplasm of sigmoid colon: Secondary | ICD-10-CM

## 2019-08-13 DIAGNOSIS — Z1211 Encounter for screening for malignant neoplasm of colon: Secondary | ICD-10-CM

## 2019-08-13 DIAGNOSIS — Z7982 Long term (current) use of aspirin: Secondary | ICD-10-CM | POA: Insufficient documentation

## 2019-08-13 DIAGNOSIS — D122 Benign neoplasm of ascending colon: Secondary | ICD-10-CM | POA: Insufficient documentation

## 2019-08-13 DIAGNOSIS — Z79899 Other long term (current) drug therapy: Secondary | ICD-10-CM | POA: Insufficient documentation

## 2019-08-13 DIAGNOSIS — I10 Essential (primary) hypertension: Secondary | ICD-10-CM | POA: Insufficient documentation

## 2019-08-13 DIAGNOSIS — I2510 Atherosclerotic heart disease of native coronary artery without angina pectoris: Secondary | ICD-10-CM | POA: Insufficient documentation

## 2019-08-13 DIAGNOSIS — D123 Benign neoplasm of transverse colon: Secondary | ICD-10-CM | POA: Insufficient documentation

## 2019-08-13 DIAGNOSIS — Z7902 Long term (current) use of antithrombotics/antiplatelets: Secondary | ICD-10-CM | POA: Insufficient documentation

## 2019-08-13 DIAGNOSIS — K635 Polyp of colon: Secondary | ICD-10-CM

## 2019-08-13 HISTORY — DX: Dyspnea, unspecified: R06.00

## 2019-08-13 HISTORY — DX: Presence of spectacles and contact lenses: Z97.3

## 2019-08-13 HISTORY — DX: Allergy, unspecified, initial encounter: T78.40XA

## 2019-08-13 HISTORY — DX: Disease of blood and blood-forming organs, unspecified: D75.9

## 2019-08-13 HISTORY — PX: POLYPECTOMY: SHX5525

## 2019-08-13 HISTORY — DX: Other complications of anesthesia, initial encounter: T88.59XA

## 2019-08-13 HISTORY — PX: COLONOSCOPY WITH PROPOFOL: SHX5780

## 2019-08-13 SURGERY — COLONOSCOPY WITH PROPOFOL
Anesthesia: General | Site: Rectum

## 2019-08-13 MED ORDER — LACTATED RINGERS IV SOLN
INTRAVENOUS | Status: DC
  Administered 2019-08-13: 11:00:00 via INTRAVENOUS

## 2019-08-13 MED ORDER — PROPOFOL 10 MG/ML IV BOLUS
INTRAVENOUS | Status: DC | PRN
  Administered 2019-08-13 (×2): 100 mg via INTRAVENOUS
  Administered 2019-08-13: 50 mg via INTRAVENOUS

## 2019-08-13 MED ORDER — STERILE WATER FOR IRRIGATION IR SOLN
Status: DC | PRN
  Administered 2019-08-13: 50 mL

## 2019-08-13 MED ORDER — GLYCOPYRROLATE 0.2 MG/ML IJ SOLN
INTRAMUSCULAR | Status: DC | PRN
  Administered 2019-08-13: 0.2 mg via INTRAVENOUS

## 2019-08-13 MED ORDER — ACETAMINOPHEN 160 MG/5ML PO SOLN: 325.0000 mg | Freq: Once | ORAL | Status: DC

## 2019-08-13 MED ORDER — ACETAMINOPHEN 325 MG PO TABS: 325.0000 mg | ORAL_TABLET | Freq: Once | ORAL | Status: DC

## 2019-08-13 MED ORDER — LIDOCAINE HCL (CARDIAC) PF 100 MG/5ML IV SOSY
PREFILLED_SYRINGE | INTRAVENOUS | Status: DC | PRN
  Administered 2019-08-13: 40 mg via INTRAVENOUS

## 2019-08-13 MED ORDER — SODIUM CHLORIDE 0.9 % IV SOLN: INTRAVENOUS | Status: DC

## 2019-08-13 SURGICAL SUPPLY — 8 items
CANISTER SUCT 1200ML W/VALVE (MISCELLANEOUS) ×3 IMPLANT
FORCEPS BIOP RAD 4 LRG CAP 4 (CUTTING FORCEPS) ×3 IMPLANT
GOWN CVR UNV OPN BCK APRN NK (MISCELLANEOUS) ×2 IMPLANT
GOWN ISOL THUMB LOOP REG UNIV (MISCELLANEOUS) ×4
KIT ENDO PROCEDURE OLY (KITS) ×3 IMPLANT
SNARE SHORT THROW 13M SML OVAL (MISCELLANEOUS) ×3 IMPLANT
TRAP ETRAP POLY (MISCELLANEOUS) ×3 IMPLANT
WATER STERILE IRR 250ML POUR (IV SOLUTION) ×3 IMPLANT

## 2019-08-13 NOTE — Op Note (Signed)
[facility] Gastroenterology Patient Name: Kelsey Terry Procedure Date: 08/13/2019 10:49 AM MRN: [id number] Account #: [id number] Date of Birth: 1961-09-29 Admit Type: Outpatient Age: 58 Room: [facility] OR ROOM 01 Gender: Female Note Status: Finalized Procedure:            Colonoscopy Indications:          Screening for colorectal malignant neoplasm Providers:            Lucilla Lame MD, MD Referring MD:         - -. [facility], MD (Referring MD) Medicines:            Propofol per Anesthesia Complications:        No immediate complications. Procedure:            Pre-Anesthesia Assessment:                       - Prior to the procedure, a History and Physical was                        performed, and patient medications and allergies were                        reviewed. The patient's tolerance of previous                        anesthesia was also reviewed. The risks and benefits of                        the procedure and the sedation options and risks were                        discussed with the patient. All questions were                        answered, and informed consent was obtained. Prior                        Anticoagulants: The patient has taken no previous                        anticoagulant or antiplatelet agents. ASA Grade                        Assessment: II - A patient with mild systemic disease.                        After reviewing the risks and benefits, the patient was                        deemed in satisfactory condition to undergo the                        procedure.                       After obtaining informed consent, the colonoscope was                        passed under direct vision. Throughout the procedure,  the patient's blood pressure, pulse, and oxygen                        saturations were monitored continuously. The                        Colonoscope was introduced through the anus and               advanced to the the cecum, identified by appendiceal                        orifice and ileocecal valve. The colonoscopy was                        performed without difficulty. The patient tolerated the                        procedure well. The quality of the bowel preparation                        was excellent. Findings:      The perianal and digital rectal examinations were normal.      Two sessile polyps were found in the ascending colon. The polyps were 3       to 4 mm in size. These polyps were removed with a cold snare. Resection       and retrieval were complete.      A 3 mm polyp was found in the splenic flexure. The polyp was sessile.       The polyp was removed with a cold biopsy forceps. Resection and       retrieval were complete.      Two sessile polyps were found in the sigmoid colon. The polyps were 3 to       4 mm in size. These polyps were removed with a cold snare. Resection and       retrieval were complete.      A few small-mouthed diverticula were found in the sigmoid colon.      Non-bleeding internal hemorrhoids were found during retroflexion. The       hemorrhoids were Grade II (internal hemorrhoids that prolapse but reduce       spontaneously). Impression:           - Two 3 to 4 mm polyps in the ascending colon, removed                        with a cold snare. Resected and retrieved.                       - One 3 mm polyp at the splenic flexure, removed with a                        cold biopsy forceps. Resected and retrieved.                       - Two 3 to 4 mm polyps in the sigmoid colon, removed                        with a cold snare. Resected and retrieved.                       -  Diverticulosis in the sigmoid colon.                       - Non-bleeding internal hemorrhoids. Recommendation:       - Discharge patient to home.                       - Resume previous diet.                       - Continue present medications.                        - Await pathology results.                       - Repeat colonoscopy in 5 years if polyp adenoma and 10                        years if hyperplastic Procedure Code(s):    --- Professional ---                       [phone], Colonoscopy, flexible; with removal of tumor(s),                        polyp(s), or other lesion(s) by snare technique                       [id number], 78, Colonoscopy, flexible; with biopsy, single                        or multiple Diagnosis Code(s):    --- Professional ---                       Z12.11, Encounter for screening for malignant neoplasm                        of colon                       K63.5, Polyp of colon CPT copyright 2019 American Medical Association. All rights reserved. The codes documented in this report are preliminary and upon coder review may  be revised to meet current compliance requirements. Lucilla Lame MD, MD 08/13/2019 11:22:13 AM This report has been signed electronically. Number of Addenda: 0 Note Initiated On: 08/13/2019 10:49 AM Scope Withdrawal Time: 0 hours 10 minutes 55 seconds  Total Procedure Duration: 0 hours 18 minutes 24 seconds  Estimated Blood Loss: Estimated blood loss: none.      [facility]

## 2019-08-13 NOTE — Transfer of Care (Signed)
Immediate Anesthesia Transfer of Care Note  Patient: Kelsey Terry  Procedure(s) Performed: COLONOSCOPY WITH BIOPSIES (N/A Rectum) POLYPECTOMY (N/A Rectum)  Patient Location: PACU  Anesthesia Type: General  Level of Consciousness: awake, alert  and patient cooperative  Airway and Oxygen Therapy: Patient Spontanous Breathing and Patient connected to supplemental oxygen  Post-op Assessment: Post-op Vital signs reviewed, Patient's Cardiovascular Status Stable, Respiratory Function Stable, Patent Airway and No signs of Nausea or vomiting  Post-op Vital Signs: Reviewed and stable  Complications: No apparent anesthesia complications

## 2019-08-13 NOTE — Anesthesia Postprocedure Evaluation (Signed)
Anesthesia Post Note  Patient: Kelsey Terry  Procedure(s) Performed: COLONOSCOPY WITH BIOPSIES (N/A Rectum) POLYPECTOMY (N/A Rectum)  Patient location during evaluation: PACU Anesthesia Type: General Level of consciousness: awake and alert and oriented Pain management: satisfactory to patient Vital Signs Assessment: post-procedure vital signs reviewed and stable Respiratory status: spontaneous breathing, nonlabored ventilation and respiratory function stable Cardiovascular status: blood pressure returned to baseline and stable Postop Assessment: Adequate PO intake and No signs of nausea or vomiting Anesthetic complications: no    Raliegh Ip

## 2019-08-13 NOTE — Anesthesia Preprocedure Evaluation (Signed)
Anesthesia Evaluation  Patient identified by MRN, date of birth, ID band Patient awake    Reviewed: Allergy & Precautions, H&P , NPO status , Patient's Chart, lab work & pertinent test results  Airway Mallampati: II  TM Distance: >3 FB Neck ROM: full    Dental no notable dental hx.    Pulmonary    Pulmonary exam normal breath sounds clear to auscultation       Cardiovascular hypertension, + CAD and + Past MI  Normal cardiovascular exam Rhythm:regular Rate:Normal     Neuro/Psych    GI/Hepatic   Endo/Other    Renal/GU      Musculoskeletal   Abdominal   Peds  Hematology   Anesthesia Other Findings   Reproductive/Obstetrics                             Anesthesia Physical Anesthesia Plan  ASA: II  Anesthesia Plan: General   Post-op Pain Management:    Induction: Intravenous  PONV Risk Score and Plan: 3 and Propofol infusion, Treatment may vary due to age or medical condition and TIVA  Airway Management Planned: Natural Airway  Additional Equipment:   Intra-op Plan:   Post-operative Plan:   Informed Consent: I have reviewed the patients History and Physical, chart, labs and discussed the procedure including the risks, benefits and alternatives for the proposed anesthesia with the patient or authorized representative who has indicated his/her understanding and acceptance.       Plan Discussed with: CRNA  Anesthesia Plan Comments:         Anesthesia Quick Evaluation

## 2019-08-13 NOTE — H&P (Signed)
Lucilla Lame, MD [address] [place], [place] [address] Phone: [phone] Fax : [phone]  Primary Care Physician:  Langston Reusing, NP Primary Gastroenterologist:  Dr. Allen Norris  Pre-Procedure History & Physical: HPI:  Kelsey Terry is a 58 y.o. female is here for a screening colonoscopy.   Past Medical History:  Diagnosis Date  . Allergies   . Asthma   . Blood dyscrasia    blood clot in right leg  . CAD (coronary artery disease) 05/05/2019  . CAD in native artery    a. NSTEMI 05/16/18: [place] 05/16/18 - ost-pLAD 95% s/p PCI/DES, mLAD 30%, ostD1 70%, ost-pLCx 30%, mRCA 90%, dRCA 20%; b. staged PCI/DES to [facility] 7/1  . Complication of anesthesia    patient stated she had "some kind of reaction to anesthesia, "thought she was having a stroke" during her second cath procedure.  . Diastolic dysfunction    a. TTE 05/16/18: EF 55-60%, no RWMA, Gr1DD, trivial AI, calcified mitral annulus, mild MR, mildly dilated LA  . Dyspnea    with activity  . Hypertension   . Lymphedema    LLE  . Pericarditis    a. ~ 2012 in [place], [place] s/p pericardiocentesis   . Wears contact lenses     Past Surgical History:  Procedure Laterality Date  . ABDOMINAL HYSTERECTOMY    . CESAREAN SECTION    . CORONARY STENT INTERVENTION N/A 05/16/2018   Procedure: CORONARY STENT INTERVENTION;  Surgeon: Wellington Hampshire, MD;  Location: [place] [facility];  Service: Cardiovascular;  Laterality: N/A;  . CORONARY STENT INTERVENTION N/A 05/19/2018   Procedure: CORONARY STENT INTERVENTION;  Surgeon: Wellington Hampshire, MD;  Location: [place] [facility];  Service: Cardiovascular;  Laterality: N/A;  . LEFT HEART CATH AND CORONARY ANGIOGRAPHY N/A 05/16/2018   Procedure: LEFT HEART CATH AND CORONARY ANGIOGRAPHY;  Surgeon: Wellington Hampshire, MD;  Location: [place] [facility];  Service: Cardiovascular;  Laterality: N/A;  . PERICARDIOCENTESIS      Prior to Admission medications   Medication Sig Start  Date End Date Taking? Authorizing Provider  CALCIUM CITRATE-VITAMIN D PO Take 1 tablet by mouth 2 (two) times daily.   Yes [provider]  chlorthalidone (HYGROTON) 25 MG tablet Take 0.5 tablets (12.5 mg total) by mouth daily. Take 12.24m every other day 08/12/19  Yes Iloabachie, Chioma E, NP  losartan (COZAAR) 100 MG tablet TAKE ONE TABLET BY MOUTH EVERY DAY. TAKE WITH HCTZ 05/18/19  Yes Dunn, RAreta Haber PA-C  Multiple Vitamin (MULTIVITAMIN) tablet Take 1 tablet by mouth daily.   Yes [provider]  polyethylene glycol (GOLYTELY) 236 g solution Drink one 8 oz glass every 20 mins until entire container is finished starting at 5:00pm on 08/12/19 08/10/19  Yes Grae Cannata, MD  potassium chloride (K-DUR) 10 MEQ tablet Take 2 tablets (20 mEq total) by mouth daily. 07/21/19  Yes Iloabachie, Chioma E, NP  rosuvastatin (CRESTOR) 40 MG tablet Take 1 tablet (40 mg total) by mouth daily. 07/21/19  Yes Iloabachie, Chioma E, NP  Ticagrelor (BRILINTA PO) Take by mouth.   Yes [provider]  aspirin EC 81 MG tablet Take 1 tablet (81 mg total) by mouth daily. 07/21/19   Iloabachie, Chioma E, NP  Blood Pressure KIT 1 kit by Does not apply route daily. 07/21/19   Iloabachie, Chioma E, NP    Allergies as of 07/30/2019 - Review Complete 07/21/2019  Allergen Reaction Noted  . Amoxicillin Itching and Other (See  Comments) 05/15/2018    Family History  Problem Relation Age of Onset  . Hypertension Mother   . Pancreatic cancer Father   . Diabetes Brother   . Breast cancer Neg Hx     Social History   Socioeconomic History  . Marital status: Widowed    Spouse name: Not on file  . Number of children: Not on file  . Years of education: Not on file  . Highest education level: Not on file  Occupational History  . Not on file  Social Needs  . Financial resource strain: Very hard  . Food insecurity    Worry: Often true    Inability: Often true  . Transportation needs    Medical: No     Non-medical: No  Tobacco Use  . Smoking status: Never Smoker  . Smokeless tobacco: Never Used  Substance and Sexual Activity  . Alcohol use: Not Currently  . Drug use: Never  . Sexual activity: Not on file  Lifestyle  . Physical activity    Days per week: 3 days    Minutes per session: 60 min  . Stress: Only a little  Relationships  . Social connections    Talks on phone: More than three times a week    Gets together: More than three times a week    Attends religious service: More than 4 times per year    Active member of club or organization: Yes    Attends meetings of clubs or organizations: Never    Relationship status: Widowed  . Intimate partner violence    Fear of current or ex partner: No    Emotionally abused: No    Physically abused: No    Forced sexual activity: No  Other Topics Concern  . Not on file  Social History Narrative  . Not on file    Review of Systems: See HPI, otherwise negative ROS  Physical Exam: BP (!) 146/75   Pulse 70   Temp 98.1 F (36.7 C) (Temporal)   Ht _0  (1.575 m)   Wt 97.5 kg   SpO2 100%   BMI 39.32 kg/m  General:   Alert,  pleasant and cooperative in NAD Head:  Normocephalic and atraumatic. Neck:  Supple; no masses or thyromegaly. Lungs:  Clear throughout to auscultation.    Heart:  Regular rate and rhythm. Abdomen:  Soft, nontender and nondistended. Normal bowel sounds, without guarding, and without rebound.   Neurologic:  Alert and  oriented x4;  grossly normal neurologically.  Impression/Plan: Kelsey Terry is now here to undergo a screening colonoscopy.  Risks, benefits, and alternatives regarding colonoscopy have been reviewed with the patient.  Questions have been answered.  All parties agreeable.

## 2019-08-14 ENCOUNTER — Encounter: Payer: Self-pay | Admitting: Gastroenterology

## 2019-08-17 ENCOUNTER — Encounter: Payer: Self-pay | Admitting: Gastroenterology

## 2019-08-18 ENCOUNTER — Encounter: Payer: Self-pay | Admitting: Gastroenterology

## 2019-08-20 ENCOUNTER — Other Ambulatory Visit: Payer: Self-pay

## 2019-08-20 ENCOUNTER — Ambulatory Visit (INDEPENDENT_AMBULATORY_CARE_PROVIDER_SITE_OTHER): Payer: Self-pay | Admitting: Cardiovascular Disease

## 2019-08-20 ENCOUNTER — Encounter: Payer: Self-pay | Admitting: Cardiovascular Disease

## 2019-08-20 VITALS — BP 142/93 | HR 72 | Temp 97.5°F | Ht 62.5 in | Wt 218.0 lb

## 2019-08-20 DIAGNOSIS — E785 Hyperlipidemia, unspecified: Secondary | ICD-10-CM

## 2019-08-20 DIAGNOSIS — I739 Peripheral vascular disease, unspecified: Secondary | ICD-10-CM

## 2019-08-20 DIAGNOSIS — I10 Essential (primary) hypertension: Secondary | ICD-10-CM

## 2019-08-20 DIAGNOSIS — I2510 Atherosclerotic heart disease of native coronary artery without angina pectoris: Secondary | ICD-10-CM

## 2019-08-20 NOTE — Patient Instructions (Signed)
Medication Instructions:  Your physician recommends that you continue on your current medications as directed. Please refer to the Current Medication list given to you today.  If you need a refill on your cardiac medications before your next appointment, please call your pharmacy.   Lab work: NONE If you have labs (blood work) drawn today and your tests are completely normal, you will receive your results only by: Marland Kitchen MyChart Message (if you have MyChart) OR . A paper copy in the mail If you have any lab test that is abnormal or we need to change your treatment, we will call you to review the results.  Testing/Procedures: NONE  Follow-Up: At [facility], you and your health needs are our priority.  As part of our continuing mission to provide you with exceptional heart care, we have created designated Provider Care Teams.  These Care Teams include your primary Cardiologist (physician) and Advanced Practice Providers (APPs -  Physician Assistants and Nurse Practitioners) who all work together to provide you with the care you need, when you need it. You will need a follow up appointment in 6 months.  Please call our office 2 months in advance to schedule this appointment.  You may see Kathlyn Sacramento, MD or one of the following Advanced Practice Providers on your designated Care Team:   Murray Hodgkins, NP Christell Faith, PA-C . Marrianne Mood, PA-C

## 2019-08-20 NOTE — Progress Notes (Signed)
Cardiology Office Note   Date:  08/20/2019   ID:  Kelsey Terry, DOB 1961-08-19, MRN [id number]  PCP:  Langston Reusing, NP  Cardiologist:   Kathlyn Sacramento, MD   Chief Complaint  Patient presents with  . other    6 month follow up. Meds reviewed by the pt. verbally. Pt. c/o shortness of breath with over exertion.       History of Present Illness: Kelsey Terry is a 58 y.o. female who presents for a follow up visit regarding coronary artery disease and peripheral arterial disease. She had non-ST elevation myocardial infarction in June 2019.  Cardiac catheterization showed severe two-vessel coronary artery disease involving proximal LAD and mid RCA.  Both were treated with PCI and drug-eluting stent placement.  Echocardiogram showed normal LV systolic function. She was noted to have peripheral arterial disease.  ABI was moderately reduced bilaterally with evidence of bilateral SFA occlusion.  She had no claudication and thus was treated medically.  I discontinued Brilinta recently before colonoscopy.  She has been doing well with no recent chest pain, shortness of breath or palpitations.  She continues to deny claudication.  She does have chronic leg edema with known history of lymphedema.    Past Medical History:  Diagnosis Date  . Allergies   . Asthma   . Blood dyscrasia    blood clot in right leg  . CAD (coronary artery disease) 05/05/2019  . CAD in native artery    a. NSTEMI 05/16/18: [place] 05/16/18 - ost-pLAD 95% s/p PCI/DES, mLAD 30%, ostD1 70%, ost-pLCx 30%, mRCA 90%, dRCA 20%; b. staged PCI/DES to [facility] 7/1  . Complication of anesthesia    patient stated she had "some kind of reaction to anesthesia, "thought she was having a stroke" during her second cath procedure.  . Diastolic dysfunction    a. TTE 05/16/18: EF 55-60%, no RWMA, Gr1DD, trivial AI, calcified mitral annulus, mild MR, mildly dilated LA  . Dyspnea    with activity  . Hypertension   . Lymphedema    LLE  .  Pericarditis    a. ~ 2012 in [place], [place] s/p pericardiocentesis   . Wears contact lenses     Past Surgical History:  Procedure Laterality Date  . ABDOMINAL HYSTERECTOMY    . CESAREAN SECTION    . COLONOSCOPY WITH PROPOFOL N/A 08/13/2019   Procedure: COLONOSCOPY WITH BIOPSIES;  Surgeon: Lucilla Lame, MD;  Location: [place];  Service: Endoscopy;  Laterality: N/A;  . CORONARY STENT INTERVENTION N/A 05/16/2018   Procedure: CORONARY STENT INTERVENTION;  Surgeon: Wellington Hampshire, MD;  Location: [place] [facility];  Service: Cardiovascular;  Laterality: N/A;  . CORONARY STENT INTERVENTION N/A 05/19/2018   Procedure: CORONARY STENT INTERVENTION;  Surgeon: Wellington Hampshire, MD;  Location: [place] [facility];  Service: Cardiovascular;  Laterality: N/A;  . LEFT HEART CATH AND CORONARY ANGIOGRAPHY N/A 05/16/2018   Procedure: LEFT HEART CATH AND CORONARY ANGIOGRAPHY;  Surgeon: Wellington Hampshire, MD;  Location: [place] [facility];  Service: Cardiovascular;  Laterality: N/A;  . PERICARDIOCENTESIS    . POLYPECTOMY N/A 08/13/2019   Procedure: POLYPECTOMY;  Surgeon: Lucilla Lame, MD;  Location: [place];  Service: Endoscopy;  Laterality: N/A;     Current Outpatient Medications  Medication Sig Dispense Refill  . aspirin EC 81 MG tablet Take 1 tablet (81 mg total) by mouth daily. 30 tablet 2  . Blood Pressure KIT 1 kit by Does not apply route daily.  1 kit 0  . CALCIUM CITRATE-VITAMIN D PO Take 1 tablet by mouth 2 (two) times daily.    . chlorthalidone (HYGROTON) 25 MG tablet Take 0.5 tablets (12.5 mg total) by mouth daily. Take 12.57m every other day 30 tablet 0  . losartan (COZAAR) 100 MG tablet TAKE ONE TABLET BY MOUTH EVERY DAY. TAKE WITH HCTZ 90 tablet 3  . Multiple Vitamin (MULTIVITAMIN) tablet Take 1 tablet by mouth daily.    . potassium chloride (K-DUR) 10 MEQ tablet Take 2 tablets (20 mEq total) by mouth daily. 60 tablet 2  . rosuvastatin (CRESTOR) 40 MG tablet Take 1  tablet (40 mg total) by mouth daily. 90 tablet 2   No current facility-administered medications for this visit.     Allergies:   Amoxicillin    Social History:  The patient  reports that she has never smoked. She has never used smokeless tobacco. She reports previous alcohol use. She reports that she does not use drugs.   Family History:  The patient's family history includes Diabetes in her brother; Hypertension in her mother; Pancreatic cancer in her father.    ROS:  Please see the history of present illness.   Otherwise, review of systems are positive for none.   All other systems are reviewed and negative.    PHYSICAL EXAM: VS:  BP (!) 142/93 (BP Location: Right Wrist, Patient Position: Sitting, Cuff Size: Normal)   Pulse 72   Temp (!) 97.5 F (36.4 C)   Ht 5' 2.5" (1.588 m)   Wt 218 lb (98.9 kg)   BMI 39.24 kg/m  , BMI Body mass index is 39.24 kg/m. GEN: Well nourished, well developed, in no acute distress  HEENT: normal  Neck: no JVD, carotid bruits, or masses Cardiac: RRR; no murmurs, rubs, or gallops, chronic lymphedema of the legs Respiratory:  clear to auscultation bilaterally, normal work of breathing GI: soft, nontender, nondistended, + BS MS: no deformity or atrophy  Skin: warm and dry, no rash Neuro:  Strength and sensation are intact Psych: euthymic mood, full affect   EKG:  EKG is ordered today. The ekg ordered today demonstrates normal sinus rhythm with no significant ST or T wave changes.   Recent Labs: 11/20/2018: Hemoglobin 11.9; Platelets 244; TSH 1.350 07/15/2019: ALT 16; BUN 20; Creatinine, Ser 1.21; Potassium 3.5; Sodium 143    Lipid Panel    Component Value Date/Time   CHOL 136 05/06/2019 1203   TRIG 81 05/06/2019 1203   HDL 44 05/06/2019 1203   CHOLHDL 3.1 05/06/2019 1203   CHOLHDL 4.2 05/16/2018 0009   VLDL 9 05/16/2018 0009   LDLCALC 76 05/06/2019 1203      Wt Readings from Last 3 Encounters:  08/20/19 218 lb (98.9 kg)  08/13/19  215 lb (97.5 kg)  07/21/19 217 lb (98.4 kg)        No flowsheet data found.    ASSESSMENT AND PLAN:  1.  Coronary artery disease involving native coronary arteries without angina: She is doing well overall with no anginal symptoms.  Continue medical therapy.  Brilinta was discontinued recently given that her MI and stent placement was more than a year ago she was having issues with easy bruising.  2.  Peripheral arterial disease: Bilateral occlusion of SFA.  No claudication.  Continue medical therapy.  3.  Hyperlipidemia: Continue high-dose rosuvastatin.  I reviewed her lipid profile in June which showed significant improvement in LDL from 168 to 76.  4.  Essential hypertension: Blood  pressure is mildly elevated today but usually more controlled at home.  We can consider increasing the dose of chlorthalidone to 25 mg once daily if needed.   Disposition:   FU with me in 6 months  Signed,  Kathlyn Sacramento, MD  08/20/2019 11:53 AM    [place]

## 2019-08-26 DIAGNOSIS — I872 Venous insufficiency (chronic) (peripheral): Secondary | ICD-10-CM | POA: Insufficient documentation

## 2019-08-26 DIAGNOSIS — I890 Lymphedema, not elsewhere classified: Secondary | ICD-10-CM | POA: Insufficient documentation

## 2019-08-26 NOTE — Progress Notes (Signed)
MRN : [id number]  Kelsey Terry is a 58 y.o. ([birth year]) female who presents with chief complaint of No chief complaint on file. Marland Kitchen  History of Present Illness:   Patient is seen for evaluation of leg swelling. The patient first noticed the swelling remotely but is now concerned because of a significant increase in the overall edema. The swelling is associated with pain and discoloration. The patient notes that in the morning the legs are significantly improved but they steadily worsened throughout the course of the day. Elevation makes the legs better, dependency makes them much worse.   There is no history of ulcerations associated with the swelling.   The patient denies any recent changes in their medications.  The patient has not been wearing graduated compression.  The patient has no had any past angiography, interventions or vascular surgery.  The patient denies a history of DVT or PE. There is no prior history of phlebitis. There is no history of primary lymphedema.  There is no history of radiation treatment to the groin or pelvis No history of malignancies. No history of trauma or groin or pelvic surgery. No history of foreign travel or parasitic infections area    No outpatient medications have been marked as taking for the 08/27/19 encounter (Appointment) with Delana Meyer, Dolores Lory, MD.    Past Medical History:  Diagnosis Date  . Allergies   . Asthma   . Blood dyscrasia    blood clot in right leg  . CAD (coronary artery disease) 05/05/2019  . CAD in native artery    a. NSTEMI 05/16/18: [facility] 05/16/18 - ost-pLAD 95% s/p PCI/DES, mLAD 30%, ostD1 70%, ost-pLCx 30%, mRCA 90%, dRCA 20%; b. staged PCI/DES to [facility] 7/1  . Complication of anesthesia    patient stated she had "some kind of reaction to anesthesia, "thought she was having a stroke" during her second cath procedure.  . Diastolic dysfunction    a. TTE 05/16/18: EF 55-60%, no RWMA, Gr1DD, trivial AI, calcified mitral  annulus, mild MR, mildly dilated LA  . Dyspnea    with activity  . Hypertension   . Lymphedema    LLE  . Pericarditis    a. ~ 2012 in [place], [place] s/p pericardiocentesis   . Wears contact lenses     Past Surgical History:  Procedure Laterality Date  . ABDOMINAL HYSTERECTOMY    . CESAREAN SECTION    . COLONOSCOPY WITH PROPOFOL N/A 08/13/2019   Procedure: COLONOSCOPY WITH BIOPSIES;  Surgeon: Lucilla Lame, MD;  Location: [facility];  Service: Endoscopy;  Laterality: N/A;  . CORONARY STENT INTERVENTION N/A 05/16/2018   Procedure: CORONARY STENT INTERVENTION;  Surgeon: Wellington Hampshire, MD;  Location: [facility];  Service: Cardiovascular;  Laterality: N/A;  . CORONARY STENT INTERVENTION N/A 05/19/2018   Procedure: CORONARY STENT INTERVENTION;  Surgeon: Wellington Hampshire, MD;  Location: [facility];  Service: Cardiovascular;  Laterality: N/A;  . LEFT HEART CATH AND CORONARY ANGIOGRAPHY N/A 05/16/2018   Procedure: LEFT HEART CATH AND CORONARY ANGIOGRAPHY;  Surgeon: Wellington Hampshire, MD;  Location: [facility];  Service: Cardiovascular;  Laterality: N/A;  . PERICARDIOCENTESIS    . POLYPECTOMY N/A 08/13/2019   Procedure: POLYPECTOMY;  Surgeon: Lucilla Lame, MD;  Location: [facility];  Service: Endoscopy;  Laterality: N/A;    Social History Social History   Tobacco Use  . Smoking status: Never Smoker  . Smokeless tobacco: Never Used  Substance Use Topics  .  Alcohol use: Not Currently  . Drug use: Never    Family History Family History  Problem Relation Age of Onset  . Hypertension Mother   . Pancreatic cancer Father   . Diabetes Brother   . Breast cancer Neg Hx   No family history of bleeding/clotting disorders, porphyria or autoimmune disease   Allergies  Allergen Reactions  . Amoxicillin Itching and Other (See Comments)    Reaction: bump in vaginal region Has patient had a PCN reaction causing immediate rash,  facial/tongue/throat swelling, SOB or lightheadedness with hypotension: No Has patient had a PCN reaction causing severe rash involving mucus membranes or skin necrosis: No Has patient had a PCN reaction that required hospitalization: No Has patient had a PCN reaction occurring within the last 10 years: No If all of the above answers are "NO", then may proceed with Cephalosporin use.      REVIEW OF SYSTEMS (Negative unless checked)  Constitutional: [] Weight loss  [] Fever  [] Chills Cardiac: [] Chest pain   [] Chest pressure   [] Palpitations   [] Shortness of breath when laying flat   [] Shortness of breath with exertion. Vascular:  [] Pain in legs with walking   [] Pain in legs at rest  [] History of DVT   [] Phlebitis   [x] Swelling in legs   [] Varicose veins   [] Non-healing ulcers Pulmonary:   [] Uses home oxygen   [] Productive cough   [] Hemoptysis   [] Wheeze  [] COPD   [] Asthma Neurologic:  [] Dizziness   [] Seizures   [] History of stroke   [] History of TIA  [] Aphasia   [] Vissual changes   [] Weakness or numbness in arm   [] Weakness or numbness in leg Musculoskeletal:   [] Joint swelling   [] Joint pain   [] Low back pain Hematologic:  [] Easy bruising  [] Easy bleeding   [] Hypercoagulable state   [] Anemic Gastrointestinal:  [] Diarrhea   [] Vomiting  [] Gastroesophageal reflux/heartburn   [] Difficulty swallowing. Genitourinary:  [] Chronic kidney disease   [] Difficult urination  [] Frequent urination   [] Blood in urine Skin:  [] Rashes   [] Ulcers  Psychological:  [] History of anxiety   []  History of major depression.  Physical Examination  There were no vitals filed for this visit. There is no height or weight on file to calculate BMI. Gen: WD/WN, NAD Head: [place]/AT, No temporalis wasting.  Ear/Nose/Throat: Hearing grossly intact, nares w/o erythema or drainage, poor dentition Eyes: PER, EOMI, sclera nonicteric.  Neck: Supple, no masses.  No bruit or JVD.  Pulmonary:  Good air movement, clear to auscultation  bilaterally, no use of accessory muscles.  Cardiac: RRR, normal S1, S2, no Murmurs. Vascular: scattered varicosities present bilaterally.  Mild venous stasis changes to the legs bilaterally.  2+ soft pitting edema on the right and 3+ on the left Vessel Right Left  PT Palpable Palpable  DP Palpable Palpable  Gastrointestinal: soft, non-distended. No guarding/no peritoneal signs.  Musculoskeletal: M/S 5/5 throughout.  No deformity or atrophy.  Neurologic: CN 2-12 intact. Pain and light touch intact in extremities.  Symmetrical.  Speech is fluent. Motor exam as listed above. Psychiatric: Judgment intact, Mood & affect appropriate for pt's clinical situation. Dermatologic: Mild venous rashes no ulcers noted.  No changes consistent with cellulitis. Lymph : No Cervical lymphadenopathy, no lichenification or skin changes of chronic lymphedema.  CBC Lab Results  Component Value Date   WBC 5.1 11/20/2018   HGB 11.9 11/20/2018   HCT 37.0 11/20/2018   MCV 83 11/20/2018   PLT 244 11/20/2018    BMET    Component  Value Date/Time   NA 143 07/15/2019 1138   K 3.5 07/15/2019 1138   CL 102 07/15/2019 1138   CO2 26 07/15/2019 1138   GLUCOSE 85 07/15/2019 1138   GLUCOSE 109 (H) 06/30/2018 1235   BUN 20 07/15/2019 1138   CREATININE 1.21 (H) 07/15/2019 1138   CALCIUM 10.0 07/15/2019 1138   GFRNONAA 49 (L) 07/15/2019 1138   GFRAA 57 (L) 07/15/2019 1138   CrCl cannot be calculated (Patient's most recent lab result is older than the maximum 21 days allowed.).  COAG Lab Results  Component Value Date   INR 0.94 05/16/2018    Radiology No results found.   Assessment/Plan 1. Lymphedema Recommend:  No surgery or intervention at this point in time.    I have reviewed my previous discussion with the patient regarding swelling and why it causes symptoms.  Patient will continue wearing graduated compression stockings class 1 (20-30 mmHg) on a daily basis. The patient will  beginning wearing the  stockings first thing in the morning and removing them in the evening. The patient is instructed specifically not to sleep in the stockings.    In addition, behavioral modification including several periods of elevation of the lower extremities during the day will be continued.  This was reviewed with the patient during the initial visit.  The patient will also continue routine exercise, especially walking on a daily basis as was discussed during the initial visit.    Despite conservative treatments including graduated compression therapy class 1 and behavioral modification including exercise and elevation the patient  has not obtained adequate control of the lymphedema.  The patient still has stage 3 lymphedema and therefore, I believe that a lymph pump should be added to improve the control of the patient's lymphedema.  Additionally, a lymph pump is warranted because it will reduce the risk of cellulitis and ulceration in the future.  Patient should follow-up in six months    2. Chronic venous insufficiency Recommend:  No surgery or intervention at this point in time.    I have reviewed my previous discussion with the patient regarding swelling and why it causes symptoms.  Patient will continue wearing graduated compression stockings class 1 (20-30 mmHg) on a daily basis. The patient will  beginning wearing the stockings first thing in the morning and removing them in the evening. The patient is instructed specifically not to sleep in the stockings.    In addition, behavioral modification including several periods of elevation of the lower extremities during the day will be continued.  This was reviewed with the patient during the initial visit.  The patient will also continue routine exercise, especially walking on a daily basis as was discussed during the initial visit.    Despite conservative treatments including graduated compression therapy class 1 and behavioral modification including  exercise and elevation the patient  has not obtained adequate control of the lymphedema.  The patient still has stage 3 lymphedema and therefore, I believe that a lymph pump should be added to improve the control of the patient's lymphedema.  Additionally, a lymph pump is warranted because it will reduce the risk of cellulitis and ulceration in the future.  Patient should follow-up in six months    3. Essential hypertension Continue antihypertensive medications as already ordered, these medications have been reviewed and there are no changes at this time.   4. Coronary artery disease involving native heart without angina pectoris, unspecified vessel or lesion type Continue cardiac and antihypertensive medications  as already ordered and reviewed, no changes at this time.  Continue statin as ordered and reviewed, no changes at this time  Nitrates PRN for chest pain     Hortencia Pilar, MD  08/26/2019 11:41 AM

## 2019-08-27 ENCOUNTER — Encounter (INDEPENDENT_AMBULATORY_CARE_PROVIDER_SITE_OTHER): Payer: Self-pay | Admitting: Vascular Surgery

## 2019-08-27 ENCOUNTER — Ambulatory Visit (INDEPENDENT_AMBULATORY_CARE_PROVIDER_SITE_OTHER): Payer: No Typology Code available for payment source | Admitting: Vascular Surgery

## 2019-08-27 ENCOUNTER — Other Ambulatory Visit: Payer: Self-pay

## 2019-08-27 VITALS — BP 162/80 | HR 81 | Resp 16 | Ht 62.5 in | Wt 215.6 lb

## 2019-08-27 DIAGNOSIS — I872 Venous insufficiency (chronic) (peripheral): Secondary | ICD-10-CM

## 2019-08-27 DIAGNOSIS — I2510 Atherosclerotic heart disease of native coronary artery without angina pectoris: Secondary | ICD-10-CM

## 2019-08-27 DIAGNOSIS — I10 Essential (primary) hypertension: Secondary | ICD-10-CM

## 2019-08-27 DIAGNOSIS — I890 Lymphedema, not elsewhere classified: Secondary | ICD-10-CM

## 2019-09-02 ENCOUNTER — Other Ambulatory Visit: Payer: Self-pay

## 2019-09-02 ENCOUNTER — Other Ambulatory Visit: Payer: Medicaid Other

## 2019-09-02 DIAGNOSIS — R899 Unspecified abnormal finding in specimens from other organs, systems and tissues: Secondary | ICD-10-CM

## 2019-09-02 DIAGNOSIS — I10 Essential (primary) hypertension: Secondary | ICD-10-CM

## 2019-09-03 LAB — COMPREHENSIVE METABOLIC PANEL
ALT: 16 IU/L (ref 0–32)
AST: 22 IU/L (ref 0–40)
Albumin/Globulin Ratio: 1.2 (ref 1.2–2.2)
Albumin: 4.2 g/dL (ref 3.8–4.9)
Alkaline Phosphatase: 68 IU/L (ref 39–117)
BUN/Creatinine Ratio: 12 (ref 12–28)
BUN: 13 mg/dL (ref 8–27)
Bilirubin Total: 0.6 mg/dL (ref 0.0–1.2)
CO2: 23 mmol/L (ref 20–29)
Calcium: 9.9 mg/dL (ref 8.7–10.3)
Chloride: 109 mmol/L — ABNORMAL HIGH (ref 96–106)
Creatinine, Ser: 1.11 mg/dL — ABNORMAL HIGH (ref 0.57–1.00)
GFR calc Af Amer: 62 mL/min/{1.73_m2} (ref >59)
GFR calc non Af Amer: 54 mL/min/{1.73_m2} — ABNORMAL LOW (ref >59)
Globulin, Total: 3.6 g/dL (ref 1.5–4.5)
Glucose: 104 mg/dL — ABNORMAL HIGH (ref 65–99)
Potassium: 3.8 mmol/L (ref 3.5–5.2)
Sodium: 147 mmol/L — ABNORMAL HIGH (ref 134–144)
Total Protein: 7.8 g/dL (ref 6.0–8.5)

## 2019-09-09 ENCOUNTER — Other Ambulatory Visit: Payer: Self-pay

## 2019-09-09 ENCOUNTER — Encounter: Payer: Self-pay | Admitting: Gerontology

## 2019-09-09 ENCOUNTER — Ambulatory Visit: Payer: Medicaid Other | Admitting: Gerontology

## 2019-09-09 VITALS — BP 135/77 | HR 69 | Temp 98.1°F | Ht 62.0 in | Wt 216.3 lb

## 2019-09-09 DIAGNOSIS — I2510 Atherosclerotic heart disease of native coronary artery without angina pectoris: Secondary | ICD-10-CM

## 2019-09-09 DIAGNOSIS — I10 Essential (primary) hypertension: Secondary | ICD-10-CM

## 2019-09-09 DIAGNOSIS — I890 Lymphedema, not elsewhere classified: Secondary | ICD-10-CM

## 2019-09-09 DIAGNOSIS — R7303 Prediabetes: Secondary | ICD-10-CM

## 2019-09-09 MED ORDER — CHLORTHALIDONE 25 MG PO TABS: 12.5000 mg | ORAL_TABLET | Freq: Every day | ORAL | 0 refills | Status: DC

## 2019-09-09 MED ORDER — ASPIRIN EC 81 MG PO TBEC: 81.0000 mg | DELAYED_RELEASE_TABLET | Freq: Every day | ORAL | 2 refills | Status: DC

## 2019-09-09 MED ORDER — ASPIRIN EC 81 MG PO TBEC: 81.0000 mg | DELAYED_RELEASE_TABLET | Freq: Every day | ORAL | 3 refills | Status: DC

## 2019-09-09 MED ORDER — CHLORTHALIDONE 25 MG PO TABS: 25.0000 mg | ORAL_TABLET | Freq: Every day | ORAL | 3 refills | Status: DC

## 2019-09-09 NOTE — Patient Instructions (Signed)
Lymphedema  Lymphedema is swelling that is caused by the abnormal collection of lymph in the tissues under the skin. Lymph is fluid from the tissues in your body that is removed through the lymphatic system. This system is part of your body's defense system (immune system) and includes lymph nodes and lymph vessels. The lymph vessels collect and carry the excess fluid, fats, proteins, and wastes from the tissues of the body to the bloodstream. This system also works to clean and remove bacteria and waste products from the body. Lymphedema occurs when the lymphatic system is blocked. When the lymph vessels or lymph nodes are blocked or damaged, lymph does not drain properly. This causes an abnormal buildup of lymph, which leads to swelling in the affected area. This may include the trunk area, or an arm or leg. Lymphedema cannot be cured by medicines, but various methods can be used to help reduce the swelling. There are two types of lymphedema: primary lymphedema and secondary lymphedema. What are the causes? The cause of this condition depends on the type of lymphedema that you have.  Primary lymphedema is caused by the absence of lymph vessels or having abnormal lymph vessels at birth.  Secondary lymphedema occurs when lymph vessels are blocked or damaged. Secondary lymphedema is more common. Common causes of lymph vessel blockage include: ? Skin infection, such as cellulitis. ? Infection by parasites (filariasis). ? Injury. ? Radiation therapy. ? Cancer. ? Formation of scar tissue. ? Surgery. What are the signs or symptoms? Symptoms of this condition include:  Swelling of the arm or leg.  A heavy or tight feeling in the arm or leg.  Swelling of the feet, toes, or fingers. Shoes or rings may fit more tightly than before.  Redness of the skin over the affected area.  Limited movement of the affected limb.  Sensitivity to touch or discomfort in the affected limb. How is this  diagnosed? This condition may be diagnosed based on:  Your symptoms and medical history.  A physical exam.  Bioimpedance spectroscopy. In this test, painless electrical currents are used to measure fluid levels in your body.  Imaging tests, such as: ? Lymphoscintigraphy. In this test, a low dose of a radioactive substance is injected to trace the flow of lymph through the lymph vessels. ? MRI. ? CT scan. ? Duplex ultrasound. This test uses sound waves to produce images of the vessels and the blood flow on a screen. ? Lymphangiography. In this test, a contrast dye is injected into the lymph vessel to help show blockages. How is this treated? Treatment for this condition may depend on the cause of your lymphedema. Treatment may include:  Complete decongestive therapy (CDT). This is done by a certified lymphedema therapist to reduce fluid congestion. This therapy includes: ? Manual lymph drainage. This is a special massage technique that promotes lymph drainage out of a limb. ? Skin care. ? Compression wrapping of the affected area. ? Specific exercises. Certain exercises can help fluid move out of the affected limb.  Compression. Various methods may be used to apply pressure to the affected limb to reduce the swelling. They include: ? Wearing compression stockings or sleeves on the affected limb. ? Wrapping the affected limb with special bandages.  Surgery. This is usually done for severe cases only. For example, surgery may be done if you have trouble moving the limb or if the swelling does not get better with other treatments. If an underlying condition is causing the lymphedema, treatment  for that condition will be done. For example, antibiotic medicines may be used to treat an infection. Follow these instructions at home: Self-care  The affected area is more likely to become injured or infected. Take these steps to help prevent infection: ? Keep the affected area clean and  dry. ? Use approved creams or lotions to keep the skin moisturized. ? Protect your skin from cuts:  Use gloves while cooking or gardening.  Do not walk barefoot.  If you shave the affected area, use an Copy.  Do not wear tight clothes, shoes, or jewelry.  Eat a healthy diet that includes a lot of fruits and vegetables. Activity  Exercise regularly as directed by your health care provider.  Do not sit with your legs crossed.  When possible, keep the affected limb raised (elevated) above the level of your heart.  Avoid carrying things with an arm that is affected by lymphedema. General instructions  Wear compression stockings or sleeves as told by your health care provider.  Note any changes in size of the affected limb. You may be instructed to take regular measurements and keep track of them.  Take over-the-counter and prescription medicines only as told by your health care provider.  If you were prescribed an antibiotic medicine, take or apply it as told by your health care provider. Do not stop using the antibiotic even if you start to feel better.  Do not use heating pads or ice packs over the affected area.  Avoid having blood draws, IV insertions, or blood pressure checked on the affected limb.  Keep all follow-up visits as told by your health care provider. This is important. Contact a health care provider if you:  Continue to have swelling in your limb.  Have a cut that does not heal.  Have redness or pain in the affected area. Get help right away if you:  Have new swelling in your limb that comes on suddenly.  Develop purplish spots, rash or sores (lesions) on your affected limb.  Have shortness of breath.  Have a fever or chills. Summary  Lymphedema is swelling that is caused by the abnormal collection of lymph in the tissues under the skin.  Lymph is fluid from the tissues in your body that is removed through the lymphatic system. This  system collects and carries excess fluid, fats, proteins, and wastes from the tissues of the body to the bloodstream.  Lymphedema causes swelling, pain, and redness in the affected area. This may include the trunk area, or an arm or leg.  Treatment for this condition may depend on the cause of your lymphedema. Treatment may include complete decongestive therapy (CDT), compression methods, surgery, or treating the underlying cause. This information is not intended to replace advice given to you by your health care provider. Make sure you discuss any questions you have with your health care provider. Document Released: 09/02/2007 Document Revised: 11/18/2017 Document Reviewed: 11/18/2017 Elsevier Patient Education  2020 [place] for Diabetes Mellitus, Adult  Carbohydrate counting is a method of keeping track of how many carbohydrates you eat. Eating carbohydrates naturally increases the amount of sugar (glucose) in the blood. Counting how many carbohydrates you eat helps keep your blood glucose within normal limits, which helps you manage your diabetes (diabetes mellitus). It is important to know how many carbohydrates you can safely have in each meal. This is different for every person. A diet and nutrition specialist (registered dietitian) can help you make a meal  plan and calculate how many carbohydrates you should have at each meal and snack. Carbohydrates are found in the following foods:  Grains, such as breads and cereals.  Dried beans and soy products.  Starchy vegetables, such as potatoes, peas, and corn.  Fruit and fruit juices.  Milk and yogurt.  Sweets and snack foods, such as cake, cookies, candy, chips, and soft drinks. How do I count carbohydrates? There are two ways to count carbohydrates in food. You can use either of the methods or a combination of both. Reading "Nutrition Facts" on packaged food The "Nutrition Facts" list is included on the  labels of almost all packaged foods and beverages in the [place] It includes:  The serving size.  Information about nutrients in each serving, including the grams (g) of carbohydrate per serving. To use the Nutrition Facts":  Decide how many servings you will have.  Multiply the number of servings by the number of carbohydrates per serving.  The resulting number is the total amount of carbohydrates that you will be having. Learning standard serving sizes of other foods When you eat carbohydrate foods that are not packaged or do not include "Nutrition Facts" on the label, you need to measure the servings in order to count the amount of carbohydrates:  Measure the foods that you will eat with a food scale or measuring cup, if needed.  Decide how many standard-size servings you will eat.  Multiply the number of servings by 15. Most carbohydrate-rich foods have about 15 g of carbohydrates per serving. ? For example, if you eat 8 oz (170 g) of strawberries, you will have eaten 2 servings and 30 g of carbohydrates (2 servings x 15 g = 30 g).  For foods that have more than one food mixed, such as soups and casseroles, you must count the carbohydrates in each food that is included. The following list contains standard serving sizes of common carbohydrate-rich foods. Each of these servings has about 15 g of carbohydrates:   hamburger bun or  English muffin.   oz (15 mL) syrup.   oz (14 g) jelly.  1 slice of bread.  1 six-inch tortilla.  3 oz (85 g) cooked rice or pasta.  4 oz (113 g) cooked dried beans.  4 oz (113 g) starchy vegetable, such as peas, corn, or potatoes.  4 oz (113 g) hot cereal.  4 oz (113 g) mashed potatoes or  of a large baked potato.  4 oz (113 g) canned or frozen fruit.  4 oz (120 mL) fruit juice.  4-6 crackers.  6 chicken nuggets.  6 oz (170 g) unsweetened dry cereal.  6 oz (170 g) plain fat-free yogurt or yogurt sweetened with artificial  sweeteners.  8 oz (240 mL) milk.  8 oz (170 g) fresh fruit or one small piece of fruit.  24 oz (680 g) popped popcorn. Example of carbohydrate counting Sample meal  3 oz (85 g) chicken breast.  6 oz (170 g) brown rice.  4 oz (113 g) corn.  8 oz (240 mL) milk.  8 oz (170 g) strawberries with sugar-free whipped topping. Carbohydrate calculation 1. Identify the foods that contain carbohydrates: ? Rice. ? Corn. ? Milk. ? Strawberries. 2. Calculate how many servings you have of each food: ? 2 servings rice. ? 1 serving corn. ? 1 serving milk. ? 1 serving strawberries. 3. Multiply each number of servings by 15 g: ? 2 servings rice x 15 g = 30 g. ? 1  serving corn x 15 g = 15 g. ? 1 serving milk x 15 g = 15 g. ? 1 serving strawberries x 15 g = 15 g. 4. Add together all of the amounts to find the total grams of carbohydrates eaten: ? 30 g + 15 g + 15 g + 15 g = 75 g of carbohydrates total. Summary  Carbohydrate counting is a method of keeping track of how many carbohydrates you eat.  Eating carbohydrates naturally increases the amount of sugar (glucose) in the blood.  Counting how many carbohydrates you eat helps keep your blood glucose within normal limits, which helps you manage your diabetes.  A diet and nutrition specialist (registered dietitian) can help you make a meal plan and calculate how many carbohydrates you should have at each meal and snack. This information is not intended to replace advice given to you by your health care provider. Make sure you discuss any questions you have with your health care provider. Document Released: 11/05/2005 Document Revised: 05/30/2017 Document Reviewed: 04/18/2016 Elsevier Patient Education  2020 [place] DASH stands for "Dietary Approaches to Stop Hypertension." The DASH eating plan is a healthy eating plan that has been shown to reduce high blood pressure (hypertension). It may also reduce your risk for  type 2 diabetes, heart disease, and stroke. The DASH eating plan may also help with weight loss. What are tips for following this plan?  General guidelines  Avoid eating more than 2,300 mg (milligrams) of salt (sodium) a day. If you have hypertension, you may need to reduce your sodium intake to 1,500 mg a day.  Limit alcohol intake to no more than 1 drink a day for nonpregnant women and 2 drinks a day for men. One drink equals 12 oz of beer, 5 oz of wine, or 1 oz of hard liquor.  Work with your health care provider to maintain a healthy body weight or to lose weight. Ask what an ideal weight is for you.  Get at least 30 minutes of exercise that causes your heart to beat faster (aerobic exercise) most days of the week. Activities may include walking, swimming, or biking.  Work with your health care provider or diet and nutrition specialist (dietitian) to adjust your eating plan to your individual calorie needs. Reading food labels   Check food labels for the amount of sodium per serving. Choose foods with less than 5 percent of the Daily Value of sodium. Generally, foods with less than 300 mg of sodium per serving fit into this eating plan.  To find whole grains, look for the word "whole" as the first word in the ingredient list. Shopping  Buy products labeled as "low-sodium" or "no salt added."  Buy fresh foods. Avoid canned foods and premade or frozen meals. Cooking  Avoid adding salt when cooking. Use salt-free seasonings or herbs instead of table salt or sea salt. Check with your health care provider or pharmacist before using salt substitutes.  Do not fry foods. Cook foods using healthy methods such as baking, boiling, grilling, and broiling instead.  Cook with heart-healthy oils, such as olive, canola, soybean, or sunflower oil. Meal planning  Eat a balanced diet that includes: ? 5 or more servings of fruits and vegetables each day. At each meal, try to fill half of your  plate with fruits and vegetables. ? Up to 6-8 servings of whole grains each day. ? Less than 6 oz of lean meat, poultry, or fish each day.  A 3-oz serving of meat is about the same size as a deck of cards. One egg equals 1 oz. ? 2 servings of low-fat dairy each day. ? A serving of nuts, seeds, or beans 5 times each week. ? Heart-healthy fats. Healthy fats called Omega-3 fatty acids are found in foods such as flaxseeds and coldwater fish, like sardines, salmon, and mackerel.  Limit how much you eat of the following: ? Canned or prepackaged foods. ? Food that is high in trans fat, such as fried foods. ? Food that is high in saturated fat, such as fatty meat. ? Sweets, desserts, sugary drinks, and other foods with added sugar. ? Full-fat dairy products.  Do not salt foods before eating.  Try to eat at least 2 vegetarian meals each week.  Eat more home-cooked food and less restaurant, buffet, and fast food.  When eating at a restaurant, ask that your food be prepared with less salt or no salt, if possible. What foods are recommended? The items listed may not be a complete list. Talk with your dietitian about what dietary choices are best for you. Grains Whole-grain or whole-wheat bread. Whole-grain or whole-wheat pasta. Brown rice. [place]. Bulgur. Whole-grain and low-sodium cereals. Pita bread. Low-fat, low-sodium crackers. Whole-wheat flour tortillas. Vegetables Fresh or frozen vegetables (raw, steamed, roasted, or grilled). Low-sodium or reduced-sodium tomato and vegetable juice. Low-sodium or reduced-sodium tomato sauce and tomato paste. Low-sodium or reduced-sodium canned vegetables. Fruits All fresh, dried, or frozen fruit. Canned fruit in natural juice (without added sugar). Meat and other protein foods Skinless chicken or [place]. Ground chicken or [place]. Pork with fat trimmed off. Fish and seafood. Egg whites. Dried beans, peas, or lentils. Unsalted nuts, nut butters, and  seeds. Unsalted canned beans. Lean cuts of beef with fat trimmed off. Low-sodium, lean deli meat. Dairy Low-fat (1%) or fat-free (skim) milk. Fat-free, low-fat, or reduced-fat cheeses. Nonfat, low-sodium ricotta or cottage cheese. Low-fat or nonfat yogurt. Low-fat, low-sodium cheese. Fats and oils Soft margarine without trans fats. Vegetable oil. Low-fat, reduced-fat, or light mayonnaise and salad dressings (reduced-sodium). Canola, safflower, olive, soybean, and sunflower oils. Avocado. Seasoning and other foods Herbs. Spices. Seasoning mixes without salt. Unsalted popcorn and pretzels. Fat-free sweets. What foods are not recommended? The items listed may not be a complete list. Talk with your dietitian about what dietary choices are best for you. Grains Baked goods made with fat, such as croissants, muffins, or some breads. Dry pasta or rice meal packs. Vegetables Creamed or fried vegetables. Vegetables in a cheese sauce. Regular canned vegetables (not low-sodium or reduced-sodium). Regular canned tomato sauce and paste (not low-sodium or reduced-sodium). Regular tomato and vegetable juice (not low-sodium or reduced-sodium). Angie Fava. Olives. Fruits Canned fruit in a light or heavy syrup. Fried fruit. Fruit in cream or butter sauce. Meat and other protein foods Fatty cuts of meat. Ribs. Fried meat. Berniece Salines. Sausage. Bologna and other processed lunch meats. Salami. Fatback. Hotdogs. Bratwurst. Salted nuts and seeds. Canned beans with added salt. Canned or smoked fish. Whole eggs or egg yolks. Chicken or [place] with skin. Dairy Whole or 2% milk, cream, and half-and-half. Whole or full-fat cream cheese. Whole-fat or sweetened yogurt. Full-fat cheese. Nondairy creamers. Whipped toppings. Processed cheese and cheese spreads. Fats and oils Butter. Stick margarine. Lard. Shortening. Ghee. Bacon fat. Tropical oils, such as coconut, palm kernel, or palm oil. Seasoning and other foods Salted popcorn and  pretzels. Onion salt, garlic salt, seasoned salt, table salt, and sea salt. Worcestershire sauce. Tartar  sauce. Barbecue sauce. Teriyaki sauce. Soy sauce, including reduced-sodium. Steak sauce. Canned and packaged gravies. Fish sauce. Oyster sauce. Cocktail sauce. Horseradish that you find on the shelf. Ketchup. Mustard. Meat flavorings and tenderizers. Bouillon cubes. Hot sauce and Tabasco sauce. Premade or packaged marinades. Premade or packaged taco seasonings. Relishes. Regular salad dressings. Where to find more information:  National Heart, Lung, and [place]: https://wilson-eaton.com/  American Heart Association: www.heart.org Summary  The DASH eating plan is a healthy eating plan that has been shown to reduce high blood pressure (hypertension). It may also reduce your risk for type 2 diabetes, heart disease, and stroke.  With the DASH eating plan, you should limit salt (sodium) intake to 2,300 mg a day. If you have hypertension, you may need to reduce your sodium intake to 1,500 mg a day.  When on the DASH eating plan, aim to eat more fresh fruits and vegetables, whole grains, lean proteins, low-fat dairy, and heart-healthy fats.  Work with your health care provider or diet and nutrition specialist (dietitian) to adjust your eating plan to your individual calorie needs. This information is not intended to replace advice given to you by your health care provider. Make sure you discuss any questions you have with your health care provider. Document Released: 10/25/2011 Document Revised: 10/18/2017 Document Reviewed: 10/29/2016 Elsevier Patient Education  2020 [employer].

## 2019-09-09 NOTE — Progress Notes (Signed)
Established Patient Office Visit  Subjective:  Patient ID: Kelsey Terry, female    DOB: August 08, 1961  Age: 58 y.o. MRN: [id number]  CC:  Chief Complaint  Patient presents with  . Hypertension    HPI Kelsey Terry presents for follow up of hypertension, Lymphedema and lab review. She reports that she's compliant with her medications, checks her blood pressure at home and states that it's always below 130/90. She was seen by Kelsey Terry on 08/20/2019 for her 6 months follow up visit regarding her Coronary artery disease and Peripheral arterial disease. Her Brilinta was discontinued, she denies shortness of breath and chest pain . Kelsey Terry recommends considering increasing Chlorthalidone to 25 mg daily.  She also was seen by Kelsey Terry on 08/27/2019 for her left leg Lymphedema. She was advised to continue to wear graduated compression stockings daily, elevate legs while sitting down, exercise, especially walking daily. She denies claudication or erythema to left leg. She states that the she has an appointment with the [employer] sales representative today.    Her Serum creatinine done on 09/02/2019 improved from 1.21 mg/dl to 1.11 mg/dl and eGFR for non AA increased from 49 to 54 and eGFR for African Americans is within normal limits. Her HgbA1c done 4 months ago was 6%, she defers treatment and  states that she will modify her diet. Colonoscopy done on 08/13/2019, two 3 to 4 mm polyps in the ascending colon was removed with a cold snare, resected and retrieved. - One 3 mm polyp at the splenic flexure, removed with a cold biopsy forceps, resected and retrieved. - Two 3 to 4 mm polyps in the sigmoid colon, removed with a cold snare, resected and retrieved. - Diverticulosis in the sigmoid colon. - Non-bleeding internal hemorrhoids. She states that she's doing well and offers no further complaint.   Past Medical History:  Diagnosis Date  . Allergies   . Asthma   . Blood  dyscrasia    blood clot in right leg  . CAD (coronary artery disease) 05/05/2019  . CAD in native artery    a. NSTEMI 05/16/18: [place] 05/16/18 - ost-pLAD 95% s/p PCI/DES, mLAD 30%, ostD1 70%, ost-pLCx 30%, mRCA 90%, dRCA 20%; b. staged PCI/DES to [facility] 7/1  . Complication of anesthesia    patient stated she had "some kind of reaction to anesthesia, "thought she was having a stroke" during her second cath procedure.  . Diastolic dysfunction    a. TTE 05/16/18: EF 55-60%, no RWMA, Gr1DD, trivial AI, calcified mitral annulus, mild MR, mildly dilated LA  . Dyspnea    with activity  . Hypertension   . Lymphedema    LLE  . Pericarditis    a. ~ 2012 in [place], [place] s/p pericardiocentesis   . Wears contact lenses     Past Surgical History:  Procedure Laterality Date  . ABDOMINAL HYSTERECTOMY    . CESAREAN SECTION    . COLONOSCOPY WITH PROPOFOL N/A 08/13/2019   Procedure: COLONOSCOPY WITH BIOPSIES;  Surgeon: Lucilla Lame, MD;  Location: [place];  Service: Endoscopy;  Laterality: N/A;  . CORONARY STENT INTERVENTION N/A 05/16/2018   Procedure: CORONARY STENT INTERVENTION;  Surgeon: Wellington Hampshire, MD;  Location: [place] [facility];  Service: Cardiovascular;  Laterality: N/A;  . CORONARY STENT INTERVENTION N/A 05/19/2018   Procedure: CORONARY STENT INTERVENTION;  Surgeon: Wellington Hampshire, MD;  Location: [place] [facility];  Service: Cardiovascular;  Laterality: N/A;  .  LEFT HEART CATH AND CORONARY ANGIOGRAPHY N/A 05/16/2018   Procedure: LEFT HEART CATH AND CORONARY ANGIOGRAPHY;  Surgeon: Wellington Hampshire, MD;  Location: [place] [facility];  Service: Cardiovascular;  Laterality: N/A;  . PERICARDIOCENTESIS    . POLYPECTOMY N/A 08/13/2019   Procedure: POLYPECTOMY;  Surgeon: Lucilla Lame, MD;  Location: [place];  Service: Endoscopy;  Laterality: N/A;    Family History  Problem Relation Age of Onset  . Hypertension Mother   . Pancreatic cancer Father   . Diabetes  Brother   . Breast cancer Neg Hx     Social History   Socioeconomic History  . Marital status: Widowed    Spouse name: Not on file  . Number of children: Not on file  . Years of education: Not on file  . Highest education level: Not on file  Occupational History  . Not on file  Social Needs  . Financial resource strain: Very hard  . Food insecurity    Worry: Often true    Inability: Often true  . Transportation needs    Medical: No    Non-medical: No  Tobacco Use  . Smoking status: Never Smoker  . Smokeless tobacco: Never Used  Substance and Sexual Activity  . Alcohol use: Not Currently  . Drug use: Never  . Sexual activity: Not on file  Lifestyle  . Physical activity    Days per week: 3 days    Minutes per session: 60 min  . Stress: Only a little  Relationships  . Social connections    Talks on phone: More than three times a week    Gets together: More than three times a week    Attends religious service: More than 4 times per year    Active member of club or organization: Yes    Attends meetings of clubs or organizations: Never    Relationship status: Widowed  . Intimate partner violence    Fear of current or ex partner: No    Emotionally abused: No    Physically abused: No    Forced sexual activity: No  Other Topics Concern  . Not on file  Social History Narrative  . Not on file    Outpatient Medications Prior to Visit  Medication Sig Dispense Refill  . Blood Pressure KIT 1 kit by Does not apply route daily. 1 kit 0  . CALCIUM CITRATE-VITAMIN D PO Take 1 tablet by mouth 2 (two) times daily.    Marland Kitchen losartan (COZAAR) 100 MG tablet TAKE ONE TABLET BY MOUTH EVERY DAY. TAKE WITH HCTZ 90 tablet 3  . Multiple Vitamin (MULTIVITAMIN) tablet Take 1 tablet by mouth daily.    . potassium chloride (K-DUR) 10 MEQ tablet Take 2 tablets (20 mEq total) by mouth daily. 60 tablet 2  . rosuvastatin (CRESTOR) 40 MG tablet Take 1 tablet (40 mg total) by mouth daily. 90 tablet 2   . aspirin EC 81 MG tablet Take 1 tablet (81 mg total) by mouth daily. 30 tablet 2  . chlorthalidone (HYGROTON) 25 MG tablet Take 0.5 tablets (12.5 mg total) by mouth daily. Take 12.64m every other day 30 tablet 0   No facility-administered medications prior to visit.     Allergies  Allergen Reactions  . Amoxicillin Itching and Other (See Comments)    Reaction: bump in vaginal region Has patient had a PCN reaction causing immediate rash, facial/tongue/throat swelling, SOB or lightheadedness with hypotension: No Has patient had a PCN reaction causing severe rash  involving mucus membranes or skin necrosis: No Has patient had a PCN reaction that required hospitalization: No Has patient had a PCN reaction occurring within the last 10 years: No If all of the above answers are "NO", then may proceed with Cephalosporin use.     ROS Review of Systems  Constitutional: Negative.   Eyes: Negative.   Respiratory: Negative.   Cardiovascular: Positive for leg swelling (Chronic Lymphedema). Negative for palpitations.  Endocrine: Negative.   Musculoskeletal: Negative.   Skin: Negative.   Neurological: Negative.   Psychiatric/Behavioral: Negative.       Objective:    Physical Exam  Constitutional: She is oriented to person, place, and time. She appears well-developed and well-nourished.  HENT:  Head: Normocephalic and atraumatic.  Eyes: Pupils are equal, round, and reactive to light. EOM are normal.  Cardiovascular: Normal rate and regular rhythm.  Pulmonary/Chest: Effort normal and breath sounds normal.  Musculoskeletal:        General: Edema (Lymphedema to left leg) present.  Neurological: She is alert and oriented to person, place, and time.  Skin: Skin is warm and dry.  Psychiatric: She has a normal mood and affect. Her behavior is normal. Judgment and thought content normal.    BP 135/77 (BP Location: Left Arm, Patient Position: Sitting, Cuff Size: Large)   Pulse 69   Temp 98.1  F (36.7 C)   Ht _0  (1.575 m)   Wt 216 lb 4.8 oz (98.1 kg)   SpO2 100%   BMI 39.56 kg/m  Wt Readings from Last 3 Encounters:  09/09/19 216 lb 4.8 oz (98.1 kg)  08/27/19 215 lb 9.6 oz (97.8 kg)  08/20/19 218 lb (98.9 kg)   She was encouraged to continue on weight loss regimen.  Health Maintenance Due  Topic Date Due  . Hepatitis C Screening  March 02, 1961  . HIV Screening  07/15/1974  . TETANUS/TDAP  07/15/1978  . PAP SMEAR-Modifier  07/15/1980  . INFLUENZA VACCINE  06/20/2019    There are no preventive care reminders to display for this patient.  Lab Results  Component Value Date   TSH 1.350 11/20/2018   Lab Results  Component Value Date   WBC 5.1 11/20/2018   HGB 11.9 11/20/2018   HCT 37.0 11/20/2018   MCV 83 11/20/2018   PLT 244 11/20/2018   Lab Results  Component Value Date   NA 147 (H) 09/02/2019   K 3.8 09/02/2019   CO2 23 09/02/2019   GLUCOSE 104 (H) 09/02/2019   BUN 13 09/02/2019   CREATININE 1.11 (H) 09/02/2019   BILITOT 0.6 09/02/2019   ALKPHOS 68 09/02/2019   AST 22 09/02/2019   ALT 16 09/02/2019   PROT 7.8 09/02/2019   ALBUMIN 4.2 09/02/2019   CALCIUM 9.9 09/02/2019   ANIONGAP 7 06/30/2018   Lab Results  Component Value Date   CHOL 136 05/06/2019   Lab Results  Component Value Date   HDL 44 05/06/2019   Lab Results  Component Value Date   LDLCALC 76 05/06/2019   Lab Results  Component Value Date   TRIG 81 05/06/2019   Lab Results  Component Value Date   CHOLHDL 3.1 05/06/2019   Lab Results  Component Value Date   HGBA1C 6.0 (H) 05/06/2019      Assessment & Plan:    1. Lymphedema of left leg - She will continue on current treatment regimen, --Elevate your legs up above heart level while resting - Wear compression stockings if standing or walking for  a while -Reduce sodium intake and limit fluid intake -Exercise daily as tolerated. - chlorthalidone (HYGROTON) 25 MG tablet; Take 1 tablet (25 mg total) by mouth daily.  Take 12.42m every other day  Dispense: 30 tablet; Refill: 3  2. Essential hypertension - Her blood pressure is within normal limit, and her goal is < 150/90. She will continue on current treatment regimen -Low salt DASH diet -Take medications regularly on time -Exercise regularly as tolerated -Check blood pressure at least once a week at home or a nearby pharmacy and record -Goal is less than 150/90 and normal blood pressure is 120/80 - Lipid panel; Future - chlorthalidone (HYGROTON) 25 MG tablet; Take 1 tablet (25 mg total) by mouth daily. Take 12.523mevery other day  Dispense: 30 tablet; Refill: 3  3. Prediabetes - She defers treatment and was advised to continue on low carb/non concentrated sweet diet and exercise regularly. - HgB A1c; Future  4. Coronary artery disease involving native heart without angina pectoris, unspecified vessel or lesion type - She will continue on current treatment regimen. - Lipid panel; Future - aspirin EC 81 MG tablet; Take 1 tablet (81 mg total) by mouth daily.  Dispense: 30 tablet; Refill: 3    Follow-up: Return in about 8 weeks (around 11/03/2019), or if symptoms worsen or fail to improve.    Aveena Bari E Jerold CoombeNP

## 2019-09-15 ENCOUNTER — Other Ambulatory Visit: Payer: Self-pay

## 2019-09-15 DIAGNOSIS — I10 Essential (primary) hypertension: Secondary | ICD-10-CM

## 2019-09-15 DIAGNOSIS — I890 Lymphedema, not elsewhere classified: Secondary | ICD-10-CM

## 2019-09-15 MED ORDER — CHLORTHALIDONE 25 MG PO TABS: 25.0000 mg | ORAL_TABLET | Freq: Every day | ORAL | 3 refills | Status: DC

## 2019-10-05 ENCOUNTER — Ambulatory Visit: Payer: Medicaid Other | Admitting: Pharmacist

## 2019-10-05 ENCOUNTER — Other Ambulatory Visit: Payer: Self-pay

## 2019-10-05 DIAGNOSIS — Z79899 Other long term (current) drug therapy: Secondary | ICD-10-CM

## 2019-10-05 NOTE — Progress Notes (Signed)
Medication Management Clinic Visit Note  Patient: Constanza Menear MRN: [id number] Date of Birth: [birth year] PCP: Langston Reusing, NP   [name] 58 y.o. female presents for a initial MTM visit today.  Patient's identity was confirmed with two identifiers, name and birthdate.  There were no vitals taken for this visit.  Patient Information   Past Medical History:  Diagnosis Date  . Allergies   . Asthma   . Blood dyscrasia    blood clot in right leg  . CAD (coronary artery disease) 05/05/2019  . CAD in native artery    a. NSTEMI 05/16/18: [place] 05/16/18 - ost-pLAD 95% s/p PCI/DES, mLAD 30%, ostD1 70%, ost-pLCx 30%, mRCA 90%, dRCA 20%; b. staged PCI/DES to [facility] 7/1  . Complication of anesthesia    patient stated she had "some kind of reaction to anesthesia, "thought she was having a stroke" during her second cath procedure.  . Diastolic dysfunction    a. TTE 05/16/18: EF 55-60%, no RWMA, Gr1DD, trivial AI, calcified mitral annulus, mild MR, mildly dilated LA  . Dyspnea    with activity  . Hypertension   . Lymphedema    LLE  . Pericarditis    a. ~ 2012 in [place], [place] s/p pericardiocentesis   . Wears contact lenses       Past Surgical History:  Procedure Laterality Date  . ABDOMINAL HYSTERECTOMY    . CESAREAN SECTION    . COLONOSCOPY WITH PROPOFOL N/A 08/13/2019   Procedure: COLONOSCOPY WITH BIOPSIES;  Surgeon: Lucilla Lame, MD;  Location: [place];  Service: Endoscopy;  Laterality: N/A;  . CORONARY STENT INTERVENTION N/A 05/16/2018   Procedure: CORONARY STENT INTERVENTION;  Surgeon: Wellington Hampshire, MD;  Location: [place] [facility];  Service: Cardiovascular;  Laterality: N/A;  . CORONARY STENT INTERVENTION N/A 05/19/2018   Procedure: CORONARY STENT INTERVENTION;  Surgeon: Wellington Hampshire, MD;  Location: [place] [facility];  Service: Cardiovascular;  Laterality: N/A;  . LEFT HEART CATH AND CORONARY ANGIOGRAPHY N/A 05/16/2018   Procedure: LEFT HEART CATH AND  CORONARY ANGIOGRAPHY;  Surgeon: Wellington Hampshire, MD;  Location: [place] [facility];  Service: Cardiovascular;  Laterality: N/A;  . PERICARDIOCENTESIS    . POLYPECTOMY N/A 08/13/2019   Procedure: POLYPECTOMY;  Surgeon: Lucilla Lame, MD;  Location: [place];  Service: Endoscopy;  Laterality: N/A;     Family History  Problem Relation Age of Onset  . Hypertension Mother   . Pancreatic cancer Father   . Diabetes Brother   . Breast cancer Neg Hx    Social History   Substance and Sexual Activity  Alcohol Use Not Currently      Social History   Tobacco Use  Smoking Status Never Smoker  Smokeless Tobacco Never Used      Health Maintenance  Topic Date Due  . Hepatitis C Screening  20-Mar-1961  . HIV Screening  07/15/1974  . TETANUS/TDAP  07/15/1978  . PAP SMEAR-Modifier  07/15/1980  . INFLUENZA VACCINE  06/20/2019  . MAMMOGRAM  05/12/2021  . COLONOSCOPY  08/12/2024   Last ED visit:  Does not remember.  Last Visit to PCP:  October 2020 Next Visit to PCP: 12/20  Specialist Visit: 08/20/19 cardiologist.  F/u in 6 months  Dental Exam:  Not recently Eye Exam:  ~January 2020 Prostate Exam:  NA Pelvic/PAP Exam:  No Mammogram:  June or July 2020 DEXA:  No Colonoscopy: 08/2019.  Findings:  polyps, diverticulosis in sigmoid colon, non-bleeding internal hemorrhoids  Flu Vaccine:  No    Assessment and Plan:  Adherence:  Patient's pharmacy records seem to show relatively good adherence.  Patient reports good compliance, and uses pill box at home.    HTN:    A:  Chlorthalidone, losartan.  Recent serum creatinine values within last year - 0.98 > 1.06 > 1.08 > 1.21 > 1.11.  GFR (AA) 58 > 56 > 49 > 62.   She changed from HCTZ to chlorthalidone around September.  Upon review of Epic notes, BP seems to be elevated in clinic visits.  She currently takes her BP at home.  Patient reports in the AM, BP is 145/80 before medications.  After taking medications, BP is <130.   P:   Patient advised to monitor herself for headache, dizziness, lightheadedness, especially upon standing.  She does not endorse these symptoms since changing medications.  CAD/NSTEMI w/ stent/PAD (04/2018):   A:  Previous history of MI on aspirin, losartan, rosuvastatin, and recently finished therapy with Brilinta.  Dr. Fletcher Anon discontinued Brilinta prior to colonoscopy as MI was >1 year prior and she was bruising easily.  She is not taking a beta blocker.  Prior notes mention intolerance to carvedilol.  Patient self-discontinued carvedilol (side effects:  syncope, fatigue, cold sweats, numbness in right food), and suggest she may have had poor PO intake around that time contributing to syncope.  Her carvedilol dose appears to have been 12.5 mg BIDWM around that time.  Recent vitals from PCP visits show HR 69 and 81.  Patient does not report any leg pain or muscle pain on high-intensity rosuvastatin. P:  Continue current regimen.     Potassium supplementation:   A:  Klorcon.  Past CMETs show K maintained around 3.5.   No magnesium levels drawn.  Most recent CMET on 09/02/19 reveals K 3.8 and sodium elevated around 147.  Electrolytes do not appear to be affected by chlorthalidone. P:  Continue current regimen, and monitor CMETs as indicated with PCP.   HLD:   A:  Rosuvastatin:  LDL has improved LDL 168 decreased to 76 in June.  P:  As above.   Left Leg Lymphedema:   A:  Per PCP note, wears compression stockings, elevate legs while sitting, exercise (walking) daily.  She also has PAD, and has denied claudication in MD notes.  She follows up with Dr. Delana Meyer at [facility] for this.  P:  Patient unable to receive boot due to being uninsured.  Advised her to reach out to [facility], as they may be able to provide resource.  Prediabetes:   A:  Currently on no medications.  Last A1c 6% in June 2020, increased from 5.7.  She reports another A1c was drawn in October, but unsure of value.   Repeat A1c appears to be scheduled in December per Epic.  Deferring treatment per last PCP note, and desires to improve with diet (more water intake, fewer carbs, less sweets).  Patient also has increased exercise level more to try to better manage. P:  Continue lifestyle modifications, including dietary adjustment (as above) and improved physical activity.  Follow Up:  Follow up in 6 months.  Gerald Dexter, PharmD Pharmacy Resident  10/05/2019 3:07 PM

## 2019-10-21 ENCOUNTER — Other Ambulatory Visit: Payer: Medicaid Other

## 2019-10-21 ENCOUNTER — Other Ambulatory Visit: Payer: Self-pay

## 2019-10-21 DIAGNOSIS — R7303 Prediabetes: Secondary | ICD-10-CM

## 2019-10-21 DIAGNOSIS — I10 Essential (primary) hypertension: Secondary | ICD-10-CM

## 2019-10-21 DIAGNOSIS — I2510 Atherosclerotic heart disease of native coronary artery without angina pectoris: Secondary | ICD-10-CM

## 2019-10-22 LAB — LIPID PANEL
Chol/HDL Ratio: 2.7 ratio (ref 0.0–4.4)
Cholesterol, Total: 148 mg/dL (ref 100–199)
HDL: 55 mg/dL (ref >39)
LDL Chol Calc (NIH): 76 mg/dL (ref 0–99)
Triglycerides: 88 mg/dL (ref 0–149)
VLDL Cholesterol Cal: 17 mg/dL (ref 5–40)

## 2019-10-22 LAB — HEMOGLOBIN A1C
Est. average glucose Bld gHb Est-mCnc: 128 mg/dL
Hgb A1c MFr Bld: 6.1 % — ABNORMAL HIGH (ref 4.8–5.6)

## 2019-10-28 ENCOUNTER — Other Ambulatory Visit: Payer: Medicaid Other

## 2019-11-02 ENCOUNTER — Other Ambulatory Visit: Payer: Self-pay | Admitting: Gerontology

## 2019-11-02 DIAGNOSIS — I890 Lymphedema, not elsewhere classified: Secondary | ICD-10-CM

## 2019-11-04 ENCOUNTER — Ambulatory Visit: Payer: Medicaid Other | Admitting: Gerontology

## 2019-11-04 ENCOUNTER — Other Ambulatory Visit: Payer: Self-pay

## 2019-11-04 DIAGNOSIS — Z8639 Personal history of other endocrine, nutritional and metabolic disease: Secondary | ICD-10-CM | POA: Insufficient documentation

## 2019-11-04 DIAGNOSIS — I890 Lymphedema, not elsewhere classified: Secondary | ICD-10-CM

## 2019-11-04 DIAGNOSIS — I2510 Atherosclerotic heart disease of native coronary artery without angina pectoris: Secondary | ICD-10-CM

## 2019-11-04 DIAGNOSIS — I10 Essential (primary) hypertension: Secondary | ICD-10-CM

## 2019-11-04 DIAGNOSIS — R7303 Prediabetes: Secondary | ICD-10-CM

## 2019-11-04 MED ORDER — LOSARTAN POTASSIUM 100 MG PO TABS: ORAL_TABLET | ORAL | 1 refills | Status: DC

## 2019-11-04 MED ORDER — METFORMIN HCL 1000 MG PO TABS: 500.0000 mg | ORAL_TABLET | Freq: Every day | ORAL | 0 refills | Status: DC

## 2019-11-04 MED ORDER — CHLORTHALIDONE 25 MG PO TABS: 25.0000 mg | ORAL_TABLET | Freq: Every day | ORAL | 1 refills | Status: DC

## 2019-11-04 MED ORDER — POTASSIUM CHLORIDE ER 10 MEQ PO TBCR: 20.0000 meq | EXTENDED_RELEASE_TABLET | Freq: Every day | ORAL | 0 refills | Status: DC

## 2019-11-04 MED ORDER — ROSUVASTATIN CALCIUM 40 MG PO TABS: 40.0000 mg | ORAL_TABLET | Freq: Every day | ORAL | 1 refills | Status: DC

## 2019-11-04 MED ORDER — ASPIRIN EC 81 MG PO TBEC: 81.0000 mg | DELAYED_RELEASE_TABLET | Freq: Every day | ORAL | 1 refills | Status: DC

## 2019-11-04 NOTE — Patient Instructions (Signed)
Carbohydrate Counting for Diabetes Mellitus, Adult  Carbohydrate counting is a method of keeping track of how many carbohydrates you eat. Eating carbohydrates naturally increases the amount of sugar (glucose) in the blood. Counting how many carbohydrates you eat helps keep your blood glucose within normal limits, which helps you manage your diabetes (diabetes mellitus). It is important to know how many carbohydrates you can safely have in each meal. This is different for every person. A diet and nutrition specialist (registered dietitian) can help you make a meal plan and calculate how many carbohydrates you should have at each meal and snack. Carbohydrates are found in the following foods:  Grains, such as breads and cereals.  Dried beans and soy products.  Starchy vegetables, such as potatoes, peas, and corn.  Fruit and fruit juices.  Milk and yogurt.  Sweets and snack foods, such as cake, cookies, candy, chips, and soft drinks. How do I count carbohydrates? There are two ways to count carbohydrates in food. You can use either of the methods or a combination of both. Reading "Nutrition Facts" on packaged food The "Nutrition Facts" list is included on the labels of almost all packaged foods and beverages in the [place] It includes:  The serving size.  Information about nutrients in each serving, including the grams (g) of carbohydrate per serving. To use the "Nutrition Facts":  Decide how many servings you will have.  Multiply the number of servings by the number of carbohydrates per serving.  The resulting number is the total amount of carbohydrates that you will be having. Learning standard serving sizes of other foods When you eat carbohydrate foods that are not packaged or do not include "Nutrition Facts" on the label, you need to measure the servings in order to count the amount of carbohydrates:  Measure the foods that you will eat with a food scale or measuring cup, if needed.   Decide how many standard-size servings you will eat.  Multiply the number of servings by 15. Most carbohydrate-rich foods have about 15 g of carbohydrates per serving. ? For example, if you eat 8 oz (170 g) of strawberries, you will have eaten 2 servings and 30 g of carbohydrates (2 servings x 15 g = 30 g).  For foods that have more than one food mixed, such as soups and casseroles, you must count the carbohydrates in each food that is included. The following list contains standard serving sizes of common carbohydrate-rich foods. Each of these servings has about 15 g of carbohydrates:   hamburger bun or  English muffin.   oz (15 mL) syrup.   oz (14 g) jelly.  1 slice of bread.  1 six-inch tortilla.  3 oz (85 g) cooked rice or pasta.  4 oz (113 g) cooked dried beans.  4 oz (113 g) starchy vegetable, such as peas, corn, or potatoes.  4 oz (113 g) hot cereal.  4 oz (113 g) mashed potatoes or  of a large baked potato.  4 oz (113 g) canned or frozen fruit.  4 oz (120 mL) fruit juice.  4-6 crackers.  6 chicken nuggets.  6 oz (170 g) unsweetened dry cereal.  6 oz (170 g) plain fat-free yogurt or yogurt sweetened with artificial sweeteners.  8 oz (240 mL) milk.  8 oz (170 g) fresh fruit or one small piece of fruit.  24 oz (680 g) popped popcorn. Example of carbohydrate counting Sample meal  3 oz (85 g) chicken breast.  6 oz (170 g)   brown rice.  4 oz (113 g) corn.  8 oz (240 mL) milk.  8 oz (170 g) strawberries with sugar-free whipped topping. Carbohydrate calculation 1. Identify the foods that contain carbohydrates: ? Rice. ? Corn. ? Milk. ? Strawberries. 2. Calculate how many servings you have of each food: ? 2 servings rice. ? 1 serving corn. ? 1 serving milk. ? 1 serving strawberries. 3. Multiply each number of servings by 15 g: ? 2 servings rice x 15 g = 30 g. ? 1 serving corn x 15 g = 15 g. ? 1 serving milk x 15 g = 15 g. ? 1 serving  strawberries x 15 g = 15 g. 4. Add together all of the amounts to find the total grams of carbohydrates eaten: ? 30 g + 15 g + 15 g + 15 g = 75 g of carbohydrates total. Summary  Carbohydrate counting is a method of keeping track of how many carbohydrates you eat.  Eating carbohydrates naturally increases the amount of sugar (glucose) in the blood.  Counting how many carbohydrates you eat helps keep your blood glucose within normal limits, which helps you manage your diabetes.  A diet and nutrition specialist (registered dietitian) can help you make a meal plan and calculate how many carbohydrates you should have at each meal and snack. This information is not intended to replace advice given to you by your health care provider. Make sure you discuss any questions you have with your health care provider. Document Released: 11/05/2005 Document Revised: 05/30/2017 Document Reviewed: 04/18/2016 Elsevier Patient Education  2020 [employer] DASH Eating Plan DASH stands for "Dietary Approaches to Stop Hypertension." The DASH eating plan is a healthy eating plan that has been shown to reduce high blood pressure (hypertension). It may also reduce your risk for type 2 diabetes, heart disease, and stroke. The DASH eating plan may also help with weight loss. What are tips for following this plan?  General guidelines  Avoid eating more than 2,300 mg (milligrams) of salt (sodium) a day. If you have hypertension, you may need to reduce your sodium intake to 1,500 mg a day.  Limit alcohol intake to no more than 1 drink a day for nonpregnant women and 2 drinks a day for men. One drink equals 12 oz of beer, 5 oz of wine, or 1 oz of hard liquor.  Work with your health care provider to maintain a healthy body weight or to lose weight. Ask what an ideal weight is for you.  Get at least 30 minutes of exercise that causes your heart to beat faster (aerobic exercise) most days of the week. Activities may  include walking, swimming, or biking.  Work with your health care provider or diet and nutrition specialist (dietitian) to adjust your eating plan to your individual calorie needs. Reading food labels   Check food labels for the amount of sodium per serving. Choose foods with less than 5 percent of the Daily Value of sodium. Generally, foods with less than 300 mg of sodium per serving fit into this eating plan.  To find whole grains, look for the word "whole" as the first word in the ingredient list. Shopping  Buy products labeled as "low-sodium" or "no salt added."  Buy fresh foods. Avoid canned foods and premade or frozen meals. Cooking  Avoid adding salt when cooking. Use salt-free seasonings or herbs instead of table salt or sea salt. Check with your health care provider or pharmacist before using salt substitutes.    Do not fry foods. Cook foods using healthy methods such as baking, boiling, grilling, and broiling instead.  Cook with heart-healthy oils, such as olive, canola, soybean, or sunflower oil. Meal planning  Eat a balanced diet that includes: ? 5 or more servings of fruits and vegetables each day. At each meal, try to fill half of your plate with fruits and vegetables. ? Up to 6-8 servings of whole grains each day. ? Less than 6 oz of lean meat, poultry, or fish each day. A 3-oz serving of meat is about the same size as a deck of cards. One egg equals 1 oz. ? 2 servings of low-fat dairy each day. ? A serving of nuts, seeds, or beans 5 times each week. ? Heart-healthy fats. Healthy fats called Omega-3 fatty acids are found in foods such as flaxseeds and coldwater fish, like sardines, salmon, and mackerel.  Limit how much you eat of the following: ? Canned or prepackaged foods. ? Food that is high in trans fat, such as fried foods. ? Food that is high in saturated fat, such as fatty meat. ? Sweets, desserts, sugary drinks, and other foods with added sugar. ? Full-fat  dairy products.  Do not salt foods before eating.  Try to eat at least 2 vegetarian meals each week.  Eat more home-cooked food and less restaurant, buffet, and fast food.  When eating at a restaurant, ask that your food be prepared with less salt or no salt, if possible. What foods are recommended? The items listed may not be a complete list. Talk with your dietitian about what dietary choices are best for you. Grains Whole-grain or whole-wheat bread. Whole-grain or whole-wheat pasta. Brown rice. Oatmeal. Quinoa. Bulgur. Whole-grain and low-sodium cereals. Pita bread. Low-fat, low-sodium crackers. Whole-wheat flour tortillas. Vegetables Fresh or frozen vegetables (raw, steamed, roasted, or grilled). Low-sodium or reduced-sodium tomato and vegetable juice. Low-sodium or reduced-sodium tomato sauce and tomato paste. Low-sodium or reduced-sodium canned vegetables. Fruits All fresh, dried, or frozen fruit. Canned fruit in natural juice (without added sugar). Meat and other protein foods Skinless chicken or turkey. Ground chicken or turkey. Pork with fat trimmed off. Fish and seafood. Egg whites. Dried beans, peas, or lentils. Unsalted nuts, nut butters, and seeds. Unsalted canned beans. Lean cuts of beef with fat trimmed off. Low-sodium, lean deli meat. Dairy Low-fat (1%) or fat-free (skim) milk. Fat-free, low-fat, or reduced-fat cheeses. Nonfat, low-sodium ricotta or cottage cheese. Low-fat or nonfat yogurt. Low-fat, low-sodium cheese. Fats and oils Soft margarine without trans fats. Vegetable oil. Low-fat, reduced-fat, or light mayonnaise and salad dressings (reduced-sodium). Canola, safflower, olive, soybean, and sunflower oils. Avocado. Seasoning and other foods Herbs. Spices. Seasoning mixes without salt. Unsalted popcorn and pretzels. Fat-free sweets. What foods are not recommended? The items listed may not be a complete list. Talk with your dietitian about what dietary choices are best  for you. Grains Baked goods made with fat, such as croissants, muffins, or some breads. Dry pasta or rice meal packs. Vegetables Creamed or fried vegetables. Vegetables in a cheese sauce. Regular canned vegetables (not low-sodium or reduced-sodium). Regular canned tomato sauce and paste (not low-sodium or reduced-sodium). Regular tomato and vegetable juice (not low-sodium or reduced-sodium). Pickles. Olives. Fruits Canned fruit in a light or heavy syrup. Fried fruit. Fruit in cream or butter sauce. Meat and other protein foods Fatty cuts of meat. Ribs. Fried meat. Bacon. Sausage. Bologna and other processed lunch meats. Salami. Fatback. Hotdogs. Bratwurst. Salted nuts and seeds. Canned beans with   added salt. Canned or smoked fish. Whole eggs or egg yolks. Chicken or turkey with skin. Dairy Whole or 2% milk, cream, and half-and-half. Whole or full-fat cream cheese. Whole-fat or sweetened yogurt. Full-fat cheese. Nondairy creamers. Whipped toppings. Processed cheese and cheese spreads. Fats and oils Butter. Stick margarine. Lard. Shortening. Ghee. Bacon fat. Tropical oils, such as coconut, palm kernel, or palm oil. Seasoning and other foods Salted popcorn and pretzels. Onion salt, garlic salt, seasoned salt, table salt, and sea salt. Worcestershire sauce. Tartar sauce. Barbecue sauce. Teriyaki sauce. Soy sauce, including reduced-sodium. Steak sauce. Canned and packaged gravies. Fish sauce. Oyster sauce. Cocktail sauce. Horseradish that you find on the shelf. Ketchup. Mustard. Meat flavorings and tenderizers. Bouillon cubes. Hot sauce and Tabasco sauce. Premade or packaged marinades. Premade or packaged taco seasonings. Relishes. Regular salad dressings. Where to find more information:  National Heart, Lung, and Blood Institute: www.nhlbi.nih.gov  American Heart Association: www.heart.org Summary  The DASH eating plan is a healthy eating plan that has been shown to reduce high blood pressure  (hypertension). It may also reduce your risk for type 2 diabetes, heart disease, and stroke.  With the DASH eating plan, you should limit salt (sodium) intake to 2,300 mg a day. If you have hypertension, you may need to reduce your sodium intake to 1,500 mg a day.  When on the DASH eating plan, aim to eat more fresh fruits and vegetables, whole grains, lean proteins, low-fat dairy, and heart-healthy fats.  Work with your health care provider or diet and nutrition specialist (dietitian) to adjust your eating plan to your individual calorie needs. This information is not intended to replace advice given to you by your health care provider. Make sure you discuss any questions you have with your health care provider. Document Released: 10/25/2011 Document Revised: 10/18/2017 Document Reviewed: 10/29/2016 Elsevier Patient Education  2020 Elsevier Inc.  

## 2019-11-04 NOTE — Progress Notes (Signed)
Established Patient Office Visit  Subjective:  Patient ID: Kelsey Terry, female    DOB: 19-Apr-1961  Age: 58 y.o. MRN: [id number]  CC: No chief complaint on file. Patient consents to telephone visit and 2 patient identifiers was used to identify patient.  HPI Kelsey Terry presents for follow up of Hypertension, Prediabetes, CAD and Lymphedema to left leg. She reports that she's compliant with her medications, checks her blood pressure at home and states that yesterday it was below 136/75. She denies chest pain, palpitation, light headedness, cough and myalgia. Her Lipid panel done on 10/21/2019 was normal, she's almost at goal with her LDL at 76 mg/dl. Her HgbA1c done on 10/21/2019 was 6.1% and she agrees to start Metformin therapy. She reports having trace edema to Left lower leg, and she takes chlorthalidone every other day. She states that [facility] is helping to find a Lymph pump for her because she's uninsured and can't afford one. She denies erythema and claudication. She states that she hasn't had an eye exam in many years. She reports that she's doing well and offers no further complaint.  Past Medical History:  Diagnosis Date  . Allergies   . Asthma   . Blood dyscrasia    blood clot in right leg  . CAD (coronary artery disease) 05/05/2019  . CAD in native artery    a. NSTEMI 05/16/18: [place] 05/16/18 - ost-pLAD 95% s/p PCI/DES, mLAD 30%, ostD1 70%, ost-pLCx 30%, mRCA 90%, dRCA 20%; b. staged PCI/DES to [facility] 7/1  . Complication of anesthesia    patient stated she had "some kind of reaction to anesthesia, "thought she was having a stroke" during her second cath procedure.  . Diastolic dysfunction    a. TTE 05/16/18: EF 55-60%, no RWMA, Gr1DD, trivial AI, calcified mitral annulus, mild MR, mildly dilated LA  . Dyspnea    with activity  . Hypertension   . Lymphedema    LLE  . Pericarditis    a. ~ 2012 in [place], [place] s/p pericardiocentesis   . Wears contact lenses     Past Surgical  History:  Procedure Laterality Date  . ABDOMINAL HYSTERECTOMY    . CESAREAN SECTION    . COLONOSCOPY WITH PROPOFOL N/A 08/13/2019   Procedure: COLONOSCOPY WITH BIOPSIES;  Surgeon: Lucilla Lame, MD;  Location: [place];  Service: Endoscopy;  Laterality: N/A;  . CORONARY STENT INTERVENTION N/A 05/16/2018   Procedure: CORONARY STENT INTERVENTION;  Surgeon: Wellington Hampshire, MD;  Location: [place] [facility];  Service: Cardiovascular;  Laterality: N/A;  . CORONARY STENT INTERVENTION N/A 05/19/2018   Procedure: CORONARY STENT INTERVENTION;  Surgeon: Wellington Hampshire, MD;  Location: [facility] [facility];  Service: Cardiovascular;  Laterality: N/A;  . LEFT HEART CATH AND CORONARY ANGIOGRAPHY N/A 05/16/2018   Procedure: LEFT HEART CATH AND CORONARY ANGIOGRAPHY;  Surgeon: Wellington Hampshire, MD;  Location: [facility];  Service: Cardiovascular;  Laterality: N/A;  . PERICARDIOCENTESIS    . POLYPECTOMY N/A 08/13/2019   Procedure: POLYPECTOMY;  Surgeon: Lucilla Lame, MD;  Location: [facility];  Service: Endoscopy;  Laterality: N/A;    Family History  Problem Relation Age of Onset  . Hypertension Mother   . Pancreatic cancer Father   . Diabetes Brother   . Breast cancer Neg Hx     Social History   Socioeconomic History  . Marital status: Widowed    Spouse name: Not on file  . Number of children: Not on file  .  Years of education: Not on file  . Highest education level: Not on file  Occupational History  . Not on file  Tobacco Use  . Smoking status: Never Smoker  . Smokeless tobacco: Never Used  Substance and Sexual Activity  . Alcohol use: Not Currently  . Drug use: Never  . Sexual activity: Not on file  Other Topics Concern  . Not on file  Social History Narrative  . Not on file   Social Determinants of Health   Financial Resource Strain: High Risk  . Difficulty of Paying Living Expenses: Very hard  Food Insecurity: Food Insecurity Present  . Worried  About Charity fundraiser in the Last Year: Often true  . Ran Out of Food in the Last Year: Often true  Transportation Needs: No Transportation Needs  . Lack of Transportation (Medical): No  . Lack of Transportation (Non-Medical): No  Physical Activity: Unknown  . Days of Exercise per Week: 3 days  . Minutes of Exercise per Session: Not on file  Stress: No Stress Concern Present  . Feeling of Stress : Only a little  Social Connections: Slightly Isolated  . Frequency of Communication with Friends and Family: More than three times a week  . Frequency of Social Gatherings with Friends and Family: More than three times a week  . Attends Religious Services: More than 4 times per year  . Active Member of Clubs or Organizations: Yes  . Attends Archivist Meetings: Never  . Marital Status: Widowed  Intimate Partner Violence: Not At Risk  . Fear of Current or Ex-Partner: No  . Emotionally Abused: No  . Physically Abused: No  . Sexually Abused: No    Outpatient Medications Prior to Visit  Medication Sig Dispense Refill  . Blood Pressure KIT 1 kit by Does not apply route daily. 1 kit 0  . CALCIUM CITRATE-VITAMIN D PO Take 1 tablet by mouth 2 (two) times daily.    . Multiple Vitamin (MULTIVITAMIN) tablet Take 1 tablet by mouth daily.    Marland Kitchen aspirin EC 81 MG tablet Take 1 tablet (81 mg total) by mouth daily. 30 tablet 3  . chlorthalidone (HYGROTON) 25 MG tablet Take 1 tablet (25 mg total) by mouth daily. (Patient taking differently: Take 25 mg by mouth daily. Patient currently takes 25 mg every other day (Per Dr. Sophronia Simas).) 30 tablet 3  . losartan (COZAAR) 100 MG tablet TAKE ONE TABLET BY MOUTH EVERY DAY. TAKE WITH HCTZ (Patient taking differently: Patient no longer taking HCTZ) 90 tablet 3  . potassium chloride (KLOR-CON) 10 MEQ tablet TAKE TWO TABLETS BY MOUTH EVERY DAY 180 tablet 0  . rosuvastatin (CRESTOR) 40 MG tablet Take 1 tablet (40 mg total) by mouth daily. 90 tablet 2   No  facility-administered medications prior to visit.    Allergies  Allergen Reactions  . Amoxicillin Itching and Other (See Comments)    Reaction: bump in vaginal region Has patient had a PCN reaction causing immediate rash, facial/tongue/throat swelling, SOB or lightheadedness with hypotension: No Has patient had a PCN reaction causing severe rash involving mucus membranes or skin necrosis: No Has patient had a PCN reaction that required hospitalization: No Has patient had a PCN reaction occurring within the last 10 years: No If all of the above answers are "NO", then may proceed with Cephalosporin use.   . Erythromycin     Hives, rash.      ROS Review of Systems  Constitutional: Negative.   Eyes:  Negative.   Respiratory: Negative.   Cardiovascular: Positive for leg swelling (chronic Lymphedema to left lower leg).  Endocrine: Negative.   Skin: Negative.   Neurological: Negative.   Psychiatric/Behavioral: Negative.       Objective:    Physical Exam No PE was done There were no vitals taken for this visit. Wt Readings from Last 3 Encounters:  09/09/19 216 lb 4.8 oz (98.1 kg)  08/27/19 215 lb 9.6 oz (97.8 kg)  08/20/19 218 lb (98.9 kg)     Health Maintenance Due  Topic Date Due  . Hepatitis C Screening  05/11/1961  . HIV Screening  07/15/1974  . TETANUS/TDAP  07/15/1978  . PAP SMEAR-Modifier  07/15/1980    There are no preventive care reminders to display for this patient.  Lab Results  Component Value Date   TSH 1.350 11/20/2018   Lab Results  Component Value Date   WBC 5.1 11/20/2018   HGB 11.9 11/20/2018   HCT 37.0 11/20/2018   MCV 83 11/20/2018   PLT 244 11/20/2018   Lab Results  Component Value Date   NA 147 (H) 09/02/2019   K 3.8 09/02/2019   CO2 23 09/02/2019   GLUCOSE 104 (H) 09/02/2019   BUN 13 09/02/2019   CREATININE 1.11 (H) 09/02/2019   BILITOT 0.6 09/02/2019   ALKPHOS 68 09/02/2019   AST 22 09/02/2019   ALT 16 09/02/2019   PROT 7.8  09/02/2019   ALBUMIN 4.2 09/02/2019   CALCIUM 9.9 09/02/2019   ANIONGAP 7 06/30/2018   Lab Results  Component Value Date   CHOL 148 10/21/2019   Lab Results  Component Value Date   HDL 55 10/21/2019   Lab Results  Component Value Date   LDLCALC 76 10/21/2019   Lab Results  Component Value Date   TRIG 88 10/21/2019   Lab Results  Component Value Date   CHOLHDL 2.7 10/21/2019   Lab Results  Component Value Date   HGBA1C 6.1 (H) 10/21/2019      Assessment & Plan:   1. Coronary artery disease involving native heart without angina pectoris, unspecified vessel or lesion type - Her Lipid panel is normal, LDL was 76 mg/dl, almost at goal of < 70. She will continue on current treatment regimen. - Lipid panel; Future - aspirin EC 81 MG tablet; Take 1 tablet (81 mg total) by mouth daily.  Dispense: 90 tablet; Refill: 1 - rosuvastatin (CRESTOR) 40 MG tablet; Take 1 tablet (40 mg total) by mouth daily.  Dispense: 90 tablet; Refill: 1  2. Lymphedema of left leg -She will continue on current treatment regimen, --Elevate your legs up above heart level while resting - Wear compression stockings if standing or walking for a while -Reduce sodium intake and limit fluid intake -Exercise daily as tolerated. - potassium chloride (KLOR-CON) 10 MEQ tablet; Take 2 tablets (20 mEq total) by mouth daily.  Dispense: 180 tablet; Refill: 0  3. Essential hypertension - Her blood pressure is within normal limit, and her goal is < 150/90. She will continue on current treatment regimen -Low salt DASH diet -Take medications regularly on time -Exercise regularly as tolerated -Check blood pressure at least once a week at home or a nearby pharmacy and record -Goal is less than 150/90 and normal blood pressure is less than 120/80 - Comp Met (CMET); Future - chlorthalidone (HYGROTON) 25 MG tablet; Take 1 tablet (25 mg total) by mouth daily. Patient currently takes 25 mg every other day (Per Dr. Arita).     Dispense: 90 tablet; Refill: 1 - losartan (COZAAR) 100 MG tablet; Patient no longer taking HCTZ  Dispense: 90 tablet; Refill: 1 - Ambulatory referral to Ophthalmology  4. Prediabetes - Her HgbA1c was 6.1%, she will start 500 mg Metformin daily, she was educated on medication side effects and advised to notify clinic. She was advised to continue on low carb/non concentrated sweet diet, exercise as tolerated. Hopefully will find an Ophthalmology referral for her. - HgB A1c; Future - Comp Met (CMET); Future - metFORMIN (GLUCOPHAGE) 1000 MG tablet; Take 0.5 tablets (500 mg total) by mouth daily with breakfast.  Dispense: 30 tablet; Refill: 0 - Ambulatory referral to Ophthalmology     Follow-up: Return in about 20 days (around 11/24/2019), or if symptoms worsen or fail to improve.    Uziel Covault Jerold Coombe, NP

## 2019-11-24 ENCOUNTER — Ambulatory Visit: Payer: Medicaid Other | Admitting: Gerontology

## 2019-11-24 ENCOUNTER — Other Ambulatory Visit: Payer: Self-pay

## 2019-11-24 DIAGNOSIS — R7303 Prediabetes: Secondary | ICD-10-CM

## 2019-11-24 DIAGNOSIS — Z0000 Encounter for general adult medical examination without abnormal findings: Secondary | ICD-10-CM | POA: Insufficient documentation

## 2019-11-24 MED ORDER — METFORMIN HCL 1000 MG PO TABS: 500.0000 mg | ORAL_TABLET | Freq: Every day | ORAL | 0 refills | Status: DC

## 2019-11-24 NOTE — Progress Notes (Signed)
Established Patient Office Visit  Subjective:  Patient ID: Kelsey Terry, female    DOB: Jul 31, 1961  Age: 59 y.o. MRN: [id number]  CC: No chief complaint on file. Patient consents to telephone visit and 2 patients identifiers was used to identify patient.  HPI Kelsey Terry presents for follow up of Prediabetes. Her HgbA1c done on 10/21/2019 was 6.1% and she was started on Metformin 500 mg daily. She states that she's tolerating medication and denies any side effects. She continues on low carb/non concentrated sweet diet and exercises as tolerated. She denies chest pain, palpitation, dizziness, GI symptoms, fever and chills. She states that she's doing well and offers no further complaint.  Past Medical History:  Diagnosis Date  . Allergies   . Asthma   . Blood dyscrasia    blood clot in right leg  . CAD (coronary artery disease) 05/05/2019  . CAD in native artery    a. NSTEMI 05/16/18: [facility] 05/16/18 - ost-pLAD 95% s/p PCI/DES, mLAD 30%, ostD1 70%, ost-pLCx 30%, mRCA 90%, dRCA 20%; b. staged PCI/DES to [facility] 7/1  . Complication of anesthesia    patient stated she had "some kind of reaction to anesthesia, "thought she was having a stroke" during her second cath procedure.  . Diastolic dysfunction    a. TTE 05/16/18: EF 55-60%, no RWMA, Gr1DD, trivial AI, calcified mitral annulus, mild MR, mildly dilated LA  . Dyspnea    with activity  . Hypertension   . Lymphedema    LLE  . Pericarditis    a. ~ 2012 in [place], [place] s/p pericardiocentesis   . Wears contact lenses     Past Surgical History:  Procedure Laterality Date  . ABDOMINAL HYSTERECTOMY    . CESAREAN SECTION    . COLONOSCOPY WITH PROPOFOL N/A 08/13/2019   Procedure: COLONOSCOPY WITH BIOPSIES;  Surgeon: Lucilla Lame, MD;  Location: [facility];  Service: Endoscopy;  Laterality: N/A;  . CORONARY STENT INTERVENTION N/A 05/16/2018   Procedure: CORONARY STENT INTERVENTION;  Surgeon: Wellington Hampshire, MD;  Location: [facility];  Service: Cardiovascular;  Laterality: N/A;  . CORONARY STENT INTERVENTION N/A 05/19/2018   Procedure: CORONARY STENT INTERVENTION;  Surgeon: Wellington Hampshire, MD;  Location: [facility];  Service: Cardiovascular;  Laterality: N/A;  . LEFT HEART CATH AND CORONARY ANGIOGRAPHY N/A 05/16/2018   Procedure: LEFT HEART CATH AND CORONARY ANGIOGRAPHY;  Surgeon: Wellington Hampshire, MD;  Location: [facility];  Service: Cardiovascular;  Laterality: N/A;  . PERICARDIOCENTESIS    . POLYPECTOMY N/A 08/13/2019   Procedure: POLYPECTOMY;  Surgeon: Lucilla Lame, MD;  Location: [facility];  Service: Endoscopy;  Laterality: N/A;    Family History  Problem Relation Age of Onset  . Hypertension Mother   . Pancreatic cancer Father   . Diabetes Brother   . Breast cancer Neg Hx     Social History   Socioeconomic History  . Marital status: Widowed    Spouse name: Not on file  . Number of children: Not on file  . Years of education: Not on file  . Highest education level: Not on file  Occupational History  . Not on file  Tobacco Use  . Smoking status: Never Smoker  . Smokeless tobacco: Never Used  Substance and Sexual Activity  . Alcohol use: Not Currently  . Drug use: Never  . Sexual activity: Not on file  Other Topics Concern  . Not on file  Social History Narrative  .  Not on file   Social Determinants of Health   Financial Resource Strain: High Risk  . Difficulty of Paying Living Expenses: Very hard  Food Insecurity: Food Insecurity Present  . Worried About Charity fundraiser in the Last Year: Often true  . Ran Out of Food in the Last Year: Often true  Transportation Needs: No Transportation Needs  . Lack of Transportation (Medical): No  . Lack of Transportation (Non-Medical): No  Physical Activity: Unknown  . Days of Exercise per Week: 3 days  . Minutes of Exercise per Session: Not on file  Stress: No Stress Concern Present  . Feeling of Stress  : Only a little  Social Connections: Slightly Isolated  . Frequency of Communication with Friends and Family: More than three times a week  . Frequency of Social Gatherings with Friends and Family: More than three times a week  . Attends Religious Services: More than 4 times per year  . Active Member of Clubs or Organizations: Yes  . Attends Archivist Meetings: Never  . Marital Status: Widowed  Intimate Partner Violence: Not At Risk  . Fear of Current or Ex-Partner: No  . Emotionally Abused: No  . Physically Abused: No  . Sexually Abused: No    Outpatient Medications Prior to Visit  Medication Sig Dispense Refill  . aspirin EC 81 MG tablet Take 1 tablet (81 mg total) by mouth daily. 90 tablet 1  . Blood Pressure KIT 1 kit by Does not apply route daily. 1 kit 0  . CALCIUM CITRATE-VITAMIN D PO Take 1 tablet by mouth 2 (two) times daily.    . chlorthalidone (HYGROTON) 25 MG tablet Take 1 tablet (25 mg total) by mouth daily. Patient currently takes 25 mg every other day (Per Dr. Sophronia Simas). 90 tablet 1  . losartan (COZAAR) 100 MG tablet Patient no longer taking HCTZ 90 tablet 1  . Multiple Vitamin (MULTIVITAMIN) tablet Take 1 tablet by mouth daily.    . potassium chloride (KLOR-CON) 10 MEQ tablet Take 2 tablets (20 mEq total) by mouth daily. 180 tablet 0  . rosuvastatin (CRESTOR) 40 MG tablet Take 1 tablet (40 mg total) by mouth daily. 90 tablet 1  . metFORMIN (GLUCOPHAGE) 1000 MG tablet Take 0.5 tablets (500 mg total) by mouth daily with breakfast. 30 tablet 0   No facility-administered medications prior to visit.    Allergies  Allergen Reactions  . Amoxicillin Itching and Other (See Comments)    Reaction: bump in vaginal region Has patient had a PCN reaction causing immediate rash, facial/tongue/throat swelling, SOB or lightheadedness with hypotension: No Has patient had a PCN reaction causing severe rash involving mucus membranes or skin necrosis: No Has patient had a PCN  reaction that required hospitalization: No Has patient had a PCN reaction occurring within the last 10 years: No If all of the above answers are "NO", then may proceed with Cephalosporin use.   . Erythromycin     Hives, rash.      ROS Review of Systems  Constitutional: Negative.   Respiratory: Negative.   Cardiovascular: Negative.   Endocrine: Negative.   Neurological: Negative.   Psychiatric/Behavioral: Negative.       Objective:    Physical Exam No physical exam was done. There were no vitals taken for this visit. Wt Readings from Last 3 Encounters:  09/09/19 216 lb 4.8 oz (98.1 kg)  08/27/19 215 lb 9.6 oz (97.8 kg)  08/20/19 218 lb (98.9 kg)  Health Maintenance Due  Topic Date Due  . Hepatitis C Screening  November [birth year], 1958  . HIV Screening  07/15/1974  . TETANUS/TDAP  07/15/1978  . PAP SMEAR-Modifier  07/15/1980    There are no preventive care reminders to display for this patient.  Lab Results  Component Value Date   TSH 1.350 11/20/2018   Lab Results  Component Value Date   WBC 5.1 11/20/2018   HGB 11.9 11/20/2018   HCT 37.0 11/20/2018   MCV 83 11/20/2018   PLT 244 11/20/2018   Lab Results  Component Value Date   NA 147 (H) 09/02/2019   K 3.8 09/02/2019   CO2 23 09/02/2019   GLUCOSE 104 (H) 09/02/2019   BUN 13 09/02/2019   CREATININE 1.11 (H) 09/02/2019   BILITOT 0.6 09/02/2019   ALKPHOS 68 09/02/2019   AST 22 09/02/2019   ALT 16 09/02/2019   PROT 7.8 09/02/2019   ALBUMIN 4.2 09/02/2019   CALCIUM 9.9 09/02/2019   ANIONGAP 7 06/30/2018   Lab Results  Component Value Date   CHOL 148 10/21/2019   Lab Results  Component Value Date   HDL 55 10/21/2019   Lab Results  Component Value Date   LDLCALC 76 10/21/2019   Lab Results  Component Value Date   TRIG 88 10/21/2019   Lab Results  Component Value Date   CHOLHDL 2.7 10/21/2019   Lab Results  Component Value Date   HGBA1C 6.1 (H) 10/21/2019      Assessment & Plan:   1.  Prediabetes - Her HgbA1c was 6.1%, she will continue on current treatment regimen. She was advised to continue on low carb/non concentrated sweet diet, and exercise as tolerated. Will recheck HgbA1c in less than 2 months. - metFORMIN (GLUCOPHAGE) 1000 MG tablet; Take 0.5 tablets (500 mg total) by mouth daily with breakfast.  Dispense: 90 tablet; Refill: 0  2. Health care maintenance  -She will follow up with Ambulatory referral to Hematology / Oncology for Pap smear screening.     Follow-up: Return in about 2 months (around 01/28/2020), or if symptoms worsen or fail to improve.    Morganne Haile Jerold Coombe, NP

## 2019-11-24 NOTE — Patient Instructions (Signed)
Carbohydrate Counting for Diabetes Mellitus, Adult  Carbohydrate counting is a method of keeping track of how many carbohydrates you eat. Eating carbohydrates naturally increases the amount of sugar (glucose) in the blood. Counting how many carbohydrates you eat helps keep your blood glucose within normal limits, which helps you manage your diabetes (diabetes mellitus). It is important to know how many carbohydrates you can safely have in each meal. This is different for every person. A diet and nutrition specialist (registered dietitian) can help you make a meal plan and calculate how many carbohydrates you should have at each meal and snack. Carbohydrates are found in the following foods:  Grains, such as breads and cereals.  Dried beans and soy products.  Starchy vegetables, such as potatoes, peas, and corn.  Fruit and fruit juices.  Milk and yogurt.  Sweets and snack foods, such as cake, cookies, candy, chips, and soft drinks. How do I count carbohydrates? There are two ways to count carbohydrates in food. You can use either of the methods or a combination of both. Reading "Nutrition Facts" on packaged food The "Nutrition Facts" list is included on the labels of almost all packaged foods and beverages in the [place] It includes:  The serving size.  Information about nutrients in each serving, including the grams (g) of carbohydrate per serving. To use the "Nutrition Facts":  Decide how many servings you will have.  Multiply the number of servings by the number of carbohydrates per serving.  The resulting number is the total amount of carbohydrates that you will be having. Learning standard serving sizes of other foods When you eat carbohydrate foods that are not packaged or do not include "Nutrition Facts" on the label, you need to measure the servings in order to count the amount of carbohydrates:  Measure the foods that you will eat with a food scale or measuring cup, if  needed.  Decide how many standard-size servings you will eat.  Multiply the number of servings by 15. Most carbohydrate-rich foods have about 15 g of carbohydrates per serving. ? For example, if you eat 8 oz (170 g) of strawberries, you will have eaten 2 servings and 30 g of carbohydrates (2 servings x 15 g = 30 g).  For foods that have more than one food mixed, such as soups and casseroles, you must count the carbohydrates in each food that is included. The following list contains standard serving sizes of common carbohydrate-rich foods. Each of these servings has about 15 g of carbohydrates:   hamburger bun or  English muffin.   oz (15 mL) syrup.   oz (14 g) jelly.  1 slice of bread.  1 six-inch tortilla.  3 oz (85 g) cooked rice or pasta.  4 oz (113 g) cooked dried beans.  4 oz (113 g) starchy vegetable, such as peas, corn, or potatoes.  4 oz (113 g) hot cereal.  4 oz (113 g) mashed potatoes or  of a large baked potato.  4 oz (113 g) canned or frozen fruit.  4 oz (120 mL) fruit juice.  4-6 crackers.  6 chicken nuggets.  6 oz (170 g) unsweetened dry cereal.  6 oz (170 g) plain fat-free yogurt or yogurt sweetened with artificial sweeteners.  8 oz (240 mL) milk.  8 oz (170 g) fresh fruit or one small piece of fruit.  24 oz (680 g) popped popcorn. Example of carbohydrate counting Sample meal  3 oz (85 g) chicken breast.  6 oz (170 g)   brown rice.  4 oz (113 g) corn.  8 oz (240 mL) milk.  8 oz (170 g) strawberries with sugar-free whipped topping. Carbohydrate calculation 1. Identify the foods that contain carbohydrates: ? Rice. ? Corn. ? Milk. ? Strawberries. 2. Calculate how many servings you have of each food: ? 2 servings rice. ? 1 serving corn. ? 1 serving milk. ? 1 serving strawberries. 3. Multiply each number of servings by 15 g: ? 2 servings rice x 15 g = 30 g. ? 1 serving corn x 15 g = 15 g. ? 1 serving milk x 15 g = 15 g. ? 1  serving strawberries x 15 g = 15 g. 4. Add together all of the amounts to find the total grams of carbohydrates eaten: ? 30 g + 15 g + 15 g + 15 g = 75 g of carbohydrates total. Summary  Carbohydrate counting is a method of keeping track of how many carbohydrates you eat.  Eating carbohydrates naturally increases the amount of sugar (glucose) in the blood.  Counting how many carbohydrates you eat helps keep your blood glucose within normal limits, which helps you manage your diabetes.  A diet and nutrition specialist (registered dietitian) can help you make a meal plan and calculate how many carbohydrates you should have at each meal and snack. This information is not intended to replace advice given to you by your health care provider. Make sure you discuss any questions you have with your health care provider. Document Revised: 05/30/2017 Document Reviewed: 04/18/2016 Elsevier Patient Education  2020 Elsevier Inc.  

## 2019-12-31 ENCOUNTER — Telehealth: Payer: Self-pay

## 2019-12-31 NOTE — Telephone Encounter (Signed)
Spoke with patient.  Made her aware that we received notification from [facility] that's her medication has been shipped through [facility]. Tracking number [id number]  Patient stated that she would like to speak to Dr. Tyrell Antonio nurse about her stent.  I made her aware I would send a note to the nurse.

## 2019-12-31 NOTE — Telephone Encounter (Signed)
The stent does not pinch or cause symptoms.  She is likely having musculoskeletal spasm.  She should make sure there are no masses in the left breast and should discuss with her primary care physician.

## 2019-12-31 NOTE — Telephone Encounter (Signed)
DPR on file with an ok to leave a detailed message. lmom with Dr. Tyrell Antonio response and recommendation. Patient can contact our office if any questions.

## 2019-12-31 NOTE — Telephone Encounter (Signed)
Contacted the patient as requested. Patient sts that she has had 1-2 episode that are short in duration that she describes as a "pinch" over her left breast. Patient is asking if it could be caused by her stent.  Patient denies chest pain or pressure. They are no associated symptoms to report. Advised the patient that she should not feel her stent and it should not be "pinching her". She denies injury or overuse of her left arm. Advised her that it may be musculoskeletal.  Advised the patient to continue to monitor  She is asking an update be fwd to Dr. Fletcher Anon.

## 2020-01-07 ENCOUNTER — Ambulatory Visit: Payer: Medicaid Other | Admitting: Gerontology

## 2020-01-13 ENCOUNTER — Ambulatory Visit: Payer: Medicaid Other | Admitting: Gerontology

## 2020-01-13 ENCOUNTER — Other Ambulatory Visit: Payer: Self-pay

## 2020-01-13 ENCOUNTER — Encounter: Payer: Self-pay | Admitting: Gerontology

## 2020-01-13 VITALS — BP 115/71 | HR 74 | Ht 62.0 in | Wt 206.0 lb

## 2020-01-13 DIAGNOSIS — M25562 Pain in left knee: Secondary | ICD-10-CM | POA: Insufficient documentation

## 2020-01-13 DIAGNOSIS — M25561 Pain in right knee: Secondary | ICD-10-CM

## 2020-01-13 MED ORDER — IBUPROFEN 800 MG PO TABS: 800.0000 mg | ORAL_TABLET | Freq: Every day | ORAL | 0 refills | Status: DC | PRN

## 2020-01-13 NOTE — Patient Instructions (Signed)
Journal for Nurse Practitioners, 15(4), 263-267. Retrieved August 25, 2018 from http://clinicalkey.com/nursing">  Knee Exercises Ask your health care provider which exercises are safe for you. Do exercises exactly as told by your health care provider and adjust them as directed. It is normal to feel mild stretching, pulling, tightness, or discomfort as you do these exercises. Stop right away if you feel sudden pain or your pain gets worse. Do not begin these exercises until told by your health care provider. Stretching and range-of-motion exercises These exercises warm up your muscles and joints and improve the movement and flexibility of your knee. These exercises also help to relieve pain and swelling. Knee extension, prone 1. Lie on your abdomen (prone position) on a bed. 2. Place your left / right knee just beyond the edge of the surface so your knee is not on the bed. You can put a towel under your left / right thigh just above your kneecap for comfort. 3. Relax your leg muscles and allow gravity to straighten your knee (extension). You should feel a stretch behind your left / right knee. 4. Hold this position for __________ seconds. 5. Scoot up so your knee is supported between repetitions. Repeat __________ times. Complete this exercise __________ times a day. Knee flexion, active  1. Lie on your back with both legs straight. If this causes back discomfort, bend your left / right knee so your foot is flat on the floor. 2. Slowly slide your left / right heel back toward your buttocks. Stop when you feel a gentle stretch in the front of your knee or thigh (flexion). 3. Hold this position for __________ seconds. 4. Slowly slide your left / right heel back to the starting position. Repeat __________ times. Complete this exercise __________ times a day. Quadriceps stretch, prone  1. Lie on your abdomen on a firm surface, such as a bed or padded floor. 2. Bend your left / right knee and hold  your ankle. If you cannot reach your ankle or pant leg, loop a belt around your foot and grab the belt instead. 3. Gently pull your heel toward your buttocks. Your knee should not slide out to the side. You should feel a stretch in the front of your thigh and knee (quadriceps). 4. Hold this position for __________ seconds. Repeat __________ times. Complete this exercise __________ times a day. Hamstring, supine 1. Lie on your back (supine position). 2. Loop a belt or towel over the ball of your left / right foot. The ball of your foot is on the walking surface, right under your toes. 3. Straighten your left / right knee and slowly pull on the belt to raise your leg until you feel a gentle stretch behind your knee (hamstring). ? Do not let your knee bend while you do this. ? Keep your other leg flat on the floor. 4. Hold this position for __________ seconds. Repeat __________ times. Complete this exercise __________ times a day. Strengthening exercises These exercises build strength and endurance in your knee. Endurance is the ability to use your muscles for a long time, even after they get tired. Quadriceps, isometric This exercise stretches the muscles in front of your thigh (quadriceps) without moving your knee joint (isometric). 1. Lie on your back with your left / right leg extended and your other knee bent. Put a rolled towel or small pillow under your knee if told by your health care provider. 2. Slowly tense the muscles in the front of your left /   right thigh. You should see your kneecap slide up toward your hip or see increased dimpling just above the knee. This motion will push the back of the knee toward the floor. 3. For __________ seconds, hold the muscle as tight as you can without increasing your pain. 4. Relax the muscles slowly and completely. Repeat __________ times. Complete this exercise __________ times a day. Straight leg raises This exercise stretches the muscles in front  of your thigh (quadriceps) and the muscles that move your hips (hip flexors). 1. Lie on your back with your left / right leg extended and your other knee bent. 2. Tense the muscles in the front of your left / right thigh. You should see your kneecap slide up or see increased dimpling just above the knee. Your thigh may even shake a bit. 3. Keep these muscles tight as you raise your leg 4-6 inches (10-15 cm) off the floor. Do not let your knee bend. 4. Hold this position for __________ seconds. 5. Keep these muscles tense as you lower your leg. 6. Relax your muscles slowly and completely after each repetition. Repeat __________ times. Complete this exercise __________ times a day. Hamstring, isometric 1. Lie on your back on a firm surface. 2. Bend your left / right knee about __________ degrees. 3. Dig your left / right heel into the surface as if you are trying to pull it toward your buttocks. Tighten the muscles in the back of your thighs (hamstring) to "dig" as hard as you can without increasing any pain. 4. Hold this position for __________ seconds. 5. Release the tension gradually and allow your muscles to relax completely for __________ seconds after each repetition. Repeat __________ times. Complete this exercise __________ times a day. Hamstring curls If told by your health care provider, do this exercise while wearing ankle weights. Begin with __________ lb weights. Then increase the weight by 1 lb (0.5 kg) increments. Do not wear ankle weights that are more than __________ lb. 1. Lie on your abdomen with your legs straight. 2. Bend your left / right knee as far as you can without feeling pain. Keep your hips flat against the floor. 3. Hold this position for __________ seconds. 4. Slowly lower your leg to the starting position. Repeat __________ times. Complete this exercise __________ times a day. Squats This exercise strengthens the muscles in front of your thigh and knee  (quadriceps). 1. Stand in front of a table, with your feet and knees pointing straight ahead. You may rest your hands on the table for balance but not for support. 2. Slowly bend your knees and lower your hips like you are going to sit in a chair. ? Keep your weight over your heels, not over your toes. ? Keep your lower legs upright so they are parallel with the table legs. ? Do not let your hips go lower than your knees. ? Do not bend lower than told by your health care provider. ? If your knee pain increases, do not bend as low. 3. Hold the squat position for __________ seconds. 4. Slowly push with your legs to return to standing. Do not use your hands to pull yourself to standing. Repeat __________ times. Complete this exercise __________ times a day. Wall slides This exercise strengthens the muscles in front of your thigh and knee (quadriceps). 1. Lean your back against a smooth wall or door, and walk your feet out 18-24 inches (46-61 cm) from it. 2. Place your feet hip-width apart. 3.   Slowly slide down the wall or door until your knees bend __________ degrees. Keep your knees over your heels, not over your toes. Keep your knees in line with your hips. 4. Hold this position for __________ seconds. Repeat __________ times. Complete this exercise __________ times a day. Straight leg raises This exercise strengthens the muscles that rotate the leg at the hip and move it away from your body (hip abductors). 1. Lie on your side with your left / right leg in the top position. Lie so your head, shoulder, knee, and hip line up. You may bend your bottom knee to help you keep your balance. 2. Roll your hips slightly forward so your hips are stacked directly over each other and your left / right knee is facing forward. 3. Leading with your heel, lift your top leg 4-6 inches (10-15 cm). You should feel the muscles in your outer hip lifting. ? Do not let your foot drift forward. ? Do not let your knee  roll toward the ceiling. 4. Hold this position for __________ seconds. 5. Slowly return your leg to the starting position. 6. Let your muscles relax completely after each repetition. Repeat __________ times. Complete this exercise __________ times a day. Straight leg raises This exercise stretches the muscles that move your hips away from the front of the pelvis (hip extensors). 1. Lie on your abdomen on a firm surface. You can put a pillow under your hips if that is more comfortable. 2. Tense the muscles in your buttocks and lift your left / right leg about 4-6 inches (10-15 cm). Keep your knee straight as you lift your leg. 3. Hold this position for __________ seconds. 4. Slowly lower your leg to the starting position. 5. Let your leg relax completely after each repetition. Repeat __________ times. Complete this exercise __________ times a day. This information is not intended to replace advice given to you by your health care provider. Make sure you discuss any questions you have with your health care provider. Document Revised: 08/26/2018 Document Reviewed: 08/26/2018 Elsevier Patient Education  2020 Elsevier Inc.  

## 2020-01-13 NOTE — Progress Notes (Signed)
Established Patient Office Visit  Subjective:  Patient ID: Kelsey Terry, female    DOB: [birth year]  Age: 59 y.o. MRN: [id number]  CC:  Chief Complaint  Patient presents with  . Hypertension  . Arthritis    right knee worse than left, some sharp pain below knee    HPI Kelsey Terry presents for complaint of worsening right knee pain. She states that she has a history of arthritis, but currently, she has being experiencing intermittent non radiating 6/10 soreness to her right knee. She states that the pain was excruciating after taking a walk yesterday. She states that the frequency and intencity of her pain has increased within the past 1-2 months. She admits that applying ice pack and taking tylenol mildly relieved her symptoms. She denies motor weakness or paresthesia. Overall, she states that she's doing well and offers no further complaint.  Past Medical History:  Diagnosis Date  . Allergies   . Asthma   . Blood dyscrasia    blood clot in right leg  . CAD (coronary artery disease) 05/05/2019  . CAD in native artery    a. NSTEMI 05/16/18: [employer] 05/16/18 - ost-pLAD 95% s/p PCI/DES, mLAD 30%, ostD1 70%, ost-pLCx 30%, mRCA 90%, dRCA 20%; b. staged PCI/DES to [facility] 7/1  . Complication of anesthesia    patient stated she had "some kind of reaction to anesthesia, "thought she was having a stroke" during her second cath procedure.  . Diastolic dysfunction    a. TTE 05/16/18: EF 55-60%, no RWMA, Gr1DD, trivial AI, calcified mitral annulus, mild MR, mildly dilated LA  . Dyspnea    with activity  . Hypertension   . Lymphedema    LLE  . Pericarditis    a. ~ 2012 in [place], [place] s/p pericardiocentesis   . Wears contact lenses     Past Surgical History:  Procedure Laterality Date  . ABDOMINAL HYSTERECTOMY    . CESAREAN SECTION    . COLONOSCOPY WITH PROPOFOL N/A 08/13/2019   Procedure: COLONOSCOPY WITH BIOPSIES;  Surgeon: Lucilla Lame, MD;  Location: [place];  Service: Endoscopy;   Laterality: N/A;  . CORONARY STENT INTERVENTION N/A 05/16/2018   Procedure: CORONARY STENT INTERVENTION;  Surgeon: Wellington Hampshire, MD;  Location: [place] [facility];  Service: Cardiovascular;  Laterality: N/A;  . CORONARY STENT INTERVENTION N/A 05/19/2018   Procedure: CORONARY STENT INTERVENTION;  Surgeon: Wellington Hampshire, MD;  Location: [place] [facility];  Service: Cardiovascular;  Laterality: N/A;  . LEFT HEART CATH AND CORONARY ANGIOGRAPHY N/A 05/16/2018   Procedure: LEFT HEART CATH AND CORONARY ANGIOGRAPHY;  Surgeon: Wellington Hampshire, MD;  Location: [place] [facility];  Service: Cardiovascular;  Laterality: N/A;  . PERICARDIOCENTESIS    . POLYPECTOMY N/A 08/13/2019   Procedure: POLYPECTOMY;  Surgeon: Lucilla Lame, MD;  Location: [place];  Service: Endoscopy;  Laterality: N/A;    Family History  Problem Relation Age of Onset  . Hypertension Mother   . Pancreatic cancer Father   . Diabetes Brother   . Breast cancer Neg Hx     Social History   Socioeconomic History  . Marital status: Widowed    Spouse name: Not on file  . Number of children: Not on file  . Years of education: Not on file  . Highest education level: Not on file  Occupational History  . Not on file  Tobacco Use  . Smoking status: Never Smoker  . Smokeless tobacco: Never Used  Substance and  Sexual Activity  . Alcohol use: Not Currently  . Drug use: Never  . Sexual activity: Not on file  Other Topics Concern  . Not on file  Social History Narrative  . Not on file   Social Determinants of Health   Financial Resource Strain: High Risk  . Difficulty of Paying Living Expenses: Very hard  Food Insecurity: Food Insecurity Present  . Worried About Charity fundraiser in the Last Year: Often true  . Ran Out of Food in the Last Year: Often true  Transportation Needs: No Transportation Needs  . Lack of Transportation (Medical): No  . Lack of Transportation (Non-Medical): No  Physical  Activity: Unknown  . Days of Exercise per Week: 3 days  . Minutes of Exercise per Session: Not on file  Stress: No Stress Concern Present  . Feeling of Stress : Only a little  Social Connections: Slightly Isolated  . Frequency of Communication with Friends and Family: More than three times a week  . Frequency of Social Gatherings with Friends and Family: More than three times a week  . Attends Religious Services: More than 4 times per year  . Active Member of Clubs or Organizations: Yes  . Attends Archivist Meetings: Never  . Marital Status: Widowed  Intimate Partner Violence: Not At Risk  . Fear of Current or Ex-Partner: No  . Emotionally Abused: No  . Physically Abused: No  . Sexually Abused: No    Outpatient Medications Prior to Visit  Medication Sig Dispense Refill  . aspirin EC 81 MG tablet Take 1 tablet (81 mg total) by mouth daily. 90 tablet 1  . Blood Pressure KIT 1 kit by Does not apply route daily. 1 kit 0  . CALCIUM CITRATE-VITAMIN D PO Take 1 tablet by mouth 2 (two) times daily.    . chlorthalidone (HYGROTON) 25 MG tablet Take 1 tablet (25 mg total) by mouth daily. Patient currently takes 25 mg every other day (Per Dr. Sophronia Simas). 90 tablet 1  . losartan (COZAAR) 100 MG tablet Patient no longer taking HCTZ 90 tablet 1  . metFORMIN (GLUCOPHAGE) 1000 MG tablet Take 0.5 tablets (500 mg total) by mouth daily with breakfast. 90 tablet 0  . Multiple Vitamin (MULTIVITAMIN) tablet Take 1 tablet by mouth daily.    . potassium chloride (KLOR-CON) 10 MEQ tablet Take 2 tablets (20 mEq total) by mouth daily. 180 tablet 0  . rosuvastatin (CRESTOR) 40 MG tablet Take 1 tablet (40 mg total) by mouth daily. 90 tablet 1   No facility-administered medications prior to visit.    Allergies  Allergen Reactions  . Amoxicillin Itching and Other (See Comments)    Reaction: bump in vaginal region Has patient had a PCN reaction causing immediate rash, facial/tongue/throat swelling, SOB  or lightheadedness with hypotension: No Has patient had a PCN reaction causing severe rash involving mucus membranes or skin necrosis: No Has patient had a PCN reaction that required hospitalization: No Has patient had a PCN reaction occurring within the last 10 years: No If all of the above answers are "NO", then may proceed with Cephalosporin use.   . Erythromycin     Hives, rash.      ROS Review of Systems  Constitutional: Negative.   Respiratory: Negative.   Cardiovascular: Positive for leg swelling (Chronic lymphedema to left leg).  Musculoskeletal: Positive for arthralgias (Right knee). Negative for neck pain.  Neurological: Negative.   Hematological: Negative.   Psychiatric/Behavioral: Negative.  Objective:    Physical Exam  Constitutional: She appears well-developed.  HENT:  Head: Normocephalic and atraumatic.  Cardiovascular: Normal rate and regular rhythm.  Pulmonary/Chest: Effort normal and breath sounds normal.  Musculoskeletal:     Right knee: No swelling. Normal range of motion. Tenderness (with palpation) present.    BP 115/71 (BP Location: Right Arm, Patient Position: Sitting)   Pulse 74   Ht _0  (1.575 m)   Wt 206 lb (93.4 kg)   SpO2 97%   BMI 37.68 kg/m  Wt Readings from Last 3 Encounters:  01/13/20 206 lb (93.4 kg)  09/09/19 216 lb 4.8 oz (98.1 kg)  08/27/19 215 lb 9.6 oz (97.8 kg)     Health Maintenance Due  Topic Date Due  . Hepatitis C Screening  10/06/1959  . HIV Screening  07/15/1974  . TETANUS/TDAP  07/15/1978  . PAP SMEAR-Modifier  07/15/1980    There are no preventive care reminders to display for this patient.  Lab Results  Component Value Date   TSH 1.350 11/20/2018   Lab Results  Component Value Date   WBC 5.1 11/20/2018   HGB 11.9 11/20/2018   HCT 37.0 11/20/2018   MCV 83 11/20/2018   PLT 244 11/20/2018   Lab Results  Component Value Date   NA 147 (H) 09/02/2019   K 3.8 09/02/2019   CO2 23 09/02/2019    GLUCOSE 104 (H) 09/02/2019   BUN 13 09/02/2019   CREATININE 1.11 (H) 09/02/2019   BILITOT 0.6 09/02/2019   ALKPHOS 68 09/02/2019   AST 22 09/02/2019   ALT 16 09/02/2019   PROT 7.8 09/02/2019   ALBUMIN 4.2 09/02/2019   CALCIUM 9.9 09/02/2019   ANIONGAP 7 06/30/2018   Lab Results  Component Value Date   CHOL 148 10/21/2019   Lab Results  Component Value Date   HDL 55 10/21/2019   Lab Results  Component Value Date   LDLCALC 76 10/21/2019   Lab Results  Component Value Date   TRIG 88 10/21/2019   Lab Results  Component Value Date   CHOLHDL 2.7 10/21/2019   Lab Results  Component Value Date   HGBA1C 6.1 (H) 10/21/2019      Assessment & Plan:     1. Acute bilateral knee pain - She was advised to alternate applying ice and heat pack,  and will follow up with - Ambulatory referral to Orthopedic Surgery Dr Vickki Hearing. - ibuprofen (ADVIL) 800 MG tablet; Take 1 tablet (800 mg total) by mouth daily as needed.  Dispense: 20 tablet; Refill: 0 Will check inflammatory markers. - Sedimentation rate; Future - C-reactive protein; Future   Follow-up: Return in about 22 days (around 02/04/2020), or if symptoms worsen or fail to improve.    Reeve Turnley Jerold Coombe, NP

## 2020-01-20 ENCOUNTER — Other Ambulatory Visit: Payer: Self-pay

## 2020-01-20 ENCOUNTER — Other Ambulatory Visit: Payer: Medicaid Other

## 2020-01-20 DIAGNOSIS — I10 Essential (primary) hypertension: Secondary | ICD-10-CM

## 2020-01-20 DIAGNOSIS — M25562 Pain in left knee: Secondary | ICD-10-CM

## 2020-01-20 DIAGNOSIS — I2510 Atherosclerotic heart disease of native coronary artery without angina pectoris: Secondary | ICD-10-CM

## 2020-01-20 DIAGNOSIS — M25561 Pain in right knee: Secondary | ICD-10-CM

## 2020-01-20 DIAGNOSIS — R7303 Prediabetes: Secondary | ICD-10-CM

## 2020-01-21 LAB — C-REACTIVE PROTEIN: CRP: <1 mg/L (ref 0–10)

## 2020-01-21 LAB — COMPREHENSIVE METABOLIC PANEL
ALT: 16 IU/L (ref 0–32)
AST: 21 IU/L (ref 0–40)
Albumin/Globulin Ratio: 1.2 (ref 1.2–2.2)
Albumin: 4.3 g/dL (ref 3.8–4.9)
Alkaline Phosphatase: 71 IU/L (ref 39–117)
BUN/Creatinine Ratio: 14 (ref 12–28)
BUN: 16 mg/dL (ref 8–27)
Bilirubin Total: 0.6 mg/dL (ref 0.0–1.2)
CO2: 24 mmol/L (ref 20–29)
Calcium: 9.9 mg/dL (ref 8.7–10.3)
Chloride: 102 mmol/L (ref 96–106)
Creatinine, Ser: 1.17 mg/dL — ABNORMAL HIGH (ref 0.57–1.00)
GFR calc Af Amer: 59 mL/min/{1.73_m2} — ABNORMAL LOW (ref >59)
GFR calc non Af Amer: 51 mL/min/{1.73_m2} — ABNORMAL LOW (ref >59)
Globulin, Total: 3.5 g/dL (ref 1.5–4.5)
Glucose: 88 mg/dL (ref 65–99)
Potassium: 3.5 mmol/L (ref 3.5–5.2)
Sodium: 142 mmol/L (ref 134–144)
Total Protein: 7.8 g/dL (ref 6.0–8.5)

## 2020-01-21 LAB — LIPID PANEL
Chol/HDL Ratio: 2.9 ratio (ref 0.0–4.4)
Cholesterol, Total: 144 mg/dL (ref 100–199)
HDL: 50 mg/dL (ref >39)
LDL Chol Calc (NIH): 80 mg/dL (ref 0–99)
Triglycerides: 68 mg/dL (ref 0–149)
VLDL Cholesterol Cal: 14 mg/dL (ref 5–40)

## 2020-01-21 LAB — SEDIMENTATION RATE: Sed Rate: 49 mm/hr — ABNORMAL HIGH (ref 0–40)

## 2020-01-21 LAB — HEMOGLOBIN A1C
Est. average glucose Bld gHb Est-mCnc: 123 mg/dL
Hgb A1c MFr Bld: 5.9 % — ABNORMAL HIGH (ref 4.8–5.6)

## 2020-01-28 ENCOUNTER — Other Ambulatory Visit: Payer: Self-pay

## 2020-01-28 ENCOUNTER — Ambulatory Visit: Payer: Medicaid Other | Admitting: Gerontology

## 2020-01-28 ENCOUNTER — Encounter: Payer: Self-pay | Admitting: Gerontology

## 2020-01-28 VITALS — BP 119/77 | HR 75 | Ht 63.0 in | Wt 205.6 lb

## 2020-01-28 DIAGNOSIS — I10 Essential (primary) hypertension: Secondary | ICD-10-CM

## 2020-01-28 DIAGNOSIS — I890 Lymphedema, not elsewhere classified: Secondary | ICD-10-CM

## 2020-01-28 DIAGNOSIS — Z0000 Encounter for general adult medical examination without abnormal findings: Secondary | ICD-10-CM

## 2020-01-28 DIAGNOSIS — R7303 Prediabetes: Secondary | ICD-10-CM

## 2020-01-28 MED ORDER — METFORMIN HCL 1000 MG PO TABS: 500.0000 mg | ORAL_TABLET | Freq: Every day | ORAL | 0 refills | Status: DC

## 2020-01-28 MED ORDER — POTASSIUM CHLORIDE ER 10 MEQ PO TBCR: 20.0000 meq | EXTENDED_RELEASE_TABLET | Freq: Every day | ORAL | 0 refills | Status: DC

## 2020-01-28 NOTE — Progress Notes (Signed)
Established Patient Office Visit  Subjective:  Patient ID: Kelsey Terry, female    DOB: 1961/05/22  Age: 59 y.o. MRN: [id number]  CC:  Chief Complaint  Patient presents with  . Hypertension    HPI Kelsey Terry presents for follow up of hypertension, lab review and medication refill. She states that she's compliant with her medications, and making life style changes. She checks her blood pressure at home and per patient it's usually less than 130/90. Her Serum creatinine done on 01/20/2020 was 1.17 mg/dl, eGFR calc non AF was 51 and eGFR calc Af Amer was 59. Her HgbA1c also decreased from 6.1% to 5.9%. She states that she continues to wear her support stockings for her left leg Lymphedema. Overall, she states that she's doing well and offers no further complaint.  Past Medical History:  Diagnosis Date  . Allergies   . Asthma   . Blood dyscrasia    blood clot in right leg  . CAD (coronary artery disease) 05/05/2019  . CAD in native artery    a. NSTEMI 05/16/18: [facility] 05/16/18 - ost-pLAD 95% s/p PCI/DES, mLAD 30%, ostD1 70%, ost-pLCx 30%, mRCA 90%, dRCA 20%; b. staged PCI/DES to [facility] 7/1  . Complication of anesthesia    patient stated she had "some kind of reaction to anesthesia, "thought she was having a stroke" during her second cath procedure.  . Diastolic dysfunction    a. TTE 05/16/18: EF 55-60%, no RWMA, Gr1DD, trivial AI, calcified mitral annulus, mild MR, mildly dilated LA  . Dyspnea    with activity  . Hypertension   . Lymphedema    LLE  . Pericarditis    a. ~ 2012 in [place], [place] s/p pericardiocentesis   . Wears contact lenses     Past Surgical History:  Procedure Laterality Date  . ABDOMINAL HYSTERECTOMY    . CESAREAN SECTION    . COLONOSCOPY WITH PROPOFOL N/A 08/13/2019   Procedure: COLONOSCOPY WITH BIOPSIES;  Surgeon: Lucilla Lame, MD;  Location: [place];  Service: Endoscopy;  Laterality: N/A;  . CORONARY STENT INTERVENTION N/A 05/16/2018   Procedure:  CORONARY STENT INTERVENTION;  Surgeon: Wellington Hampshire, MD;  Location: [place] [facility];  Service: Cardiovascular;  Laterality: N/A;  . CORONARY STENT INTERVENTION N/A 05/19/2018   Procedure: CORONARY STENT INTERVENTION;  Surgeon: Wellington Hampshire, MD;  Location: [place] [facility];  Service: Cardiovascular;  Laterality: N/A;  . LEFT HEART CATH AND CORONARY ANGIOGRAPHY N/A 05/16/2018   Procedure: LEFT HEART CATH AND CORONARY ANGIOGRAPHY;  Surgeon: Wellington Hampshire, MD;  Location: [place] [facility];  Service: Cardiovascular;  Laterality: N/A;  . PERICARDIOCENTESIS    . POLYPECTOMY N/A 08/13/2019   Procedure: POLYPECTOMY;  Surgeon: Lucilla Lame, MD;  Location: [place];  Service: Endoscopy;  Laterality: N/A;    Family History  Problem Relation Age of Onset  . Hypertension Mother   . Pancreatic cancer Father   . Diabetes Brother   . Breast cancer Neg Hx     Social History   Socioeconomic History  . Marital status: Widowed    Spouse name: Not on file  . Number of children: Not on file  . Years of education: Not on file  . Highest education level: Not on file  Occupational History  . Not on file  Tobacco Use  . Smoking status: Never Smoker  . Smokeless tobacco: Never Used  Substance and Sexual Activity  . Alcohol use: Not Currently  . Drug use:  Never  . Sexual activity: Not on file  Other Topics Concern  . Not on file  Social History Narrative  . Not on file   Social Determinants of Health   Financial Resource Strain: High Risk  . Difficulty of Paying Living Expenses: Very hard  Food Insecurity: Food Insecurity Present  . Worried About Charity fundraiser in the Last Year: Often true  . Ran Out of Food in the Last Year: Often true  Transportation Needs: No Transportation Needs  . Lack of Transportation (Medical): No  . Lack of Transportation (Non-Medical): No  Physical Activity: Unknown  . Days of Exercise per Week: 3 days  . Minutes of Exercise per  Session: Not on file  Stress: No Stress Concern Present  . Feeling of Stress : Only a little  Social Connections: Slightly Isolated  . Frequency of Communication with Friends and Family: More than three times a week  . Frequency of Social Gatherings with Friends and Family: More than three times a week  . Attends Religious Services: More than 4 times per year  . Active Member of Clubs or Organizations: Yes  . Attends Archivist Meetings: Never  . Marital Status: Widowed  Intimate Partner Violence: Not At Risk  . Fear of Current or Ex-Partner: No  . Emotionally Abused: No  . Physically Abused: No  . Sexually Abused: No    Outpatient Medications Prior to Visit  Medication Sig Dispense Refill  . aspirin EC 81 MG tablet Take 1 tablet (81 mg total) by mouth daily. 90 tablet 1  . Blood Pressure KIT 1 kit by Does not apply route daily. 1 kit 0  . CALCIUM CITRATE-VITAMIN D PO Take 1 tablet by mouth 2 (two) times daily.    . chlorthalidone (HYGROTON) 25 MG tablet Take 1 tablet (25 mg total) by mouth daily. Patient currently takes 25 mg every other day (Per Dr. Sophronia Simas). 90 tablet 1  . losartan (COZAAR) 100 MG tablet Patient no longer taking HCTZ 90 tablet 1  . Multiple Vitamin (MULTIVITAMIN) tablet Take 1 tablet by mouth daily.    . rosuvastatin (CRESTOR) 40 MG tablet Take 1 tablet (40 mg total) by mouth daily. 90 tablet 1  . ibuprofen (ADVIL) 800 MG tablet Take 1 tablet (800 mg total) by mouth daily as needed. 20 tablet 0  . metFORMIN (GLUCOPHAGE) 1000 MG tablet Take 0.5 tablets (500 mg total) by mouth daily with breakfast. 90 tablet 0  . potassium chloride (KLOR-CON) 10 MEQ tablet Take 2 tablets (20 mEq total) by mouth daily. 180 tablet 0   No facility-administered medications prior to visit.    Allergies  Allergen Reactions  . Amoxicillin Itching and Other (See Comments)    Reaction: bump in vaginal region Has patient had a PCN reaction causing immediate rash,  facial/tongue/throat swelling, SOB or lightheadedness with hypotension: No Has patient had a PCN reaction causing severe rash involving mucus membranes or skin necrosis: No Has patient had a PCN reaction that required hospitalization: No Has patient had a PCN reaction occurring within the last 10 years: No If all of the above answers are "NO", then may proceed with Cephalosporin use.   . Erythromycin     Hives, rash.      ROS Review of Systems  Constitutional: Negative.   Eyes: Negative.   Respiratory: Negative.   Cardiovascular: Positive for leg swelling (lymphedema).  Endocrine: Negative.   Neurological: Negative.       Objective:  Physical Exam  Constitutional: She is oriented to person, place, and time. She appears well-developed.  HENT:  Head: Normocephalic and atraumatic.  Eyes: Pupils are equal, round, and reactive to light. EOM are normal.  Cardiovascular: Normal rate and regular rhythm.  Pulmonary/Chest: Breath sounds normal.  Musculoskeletal:        General: Edema (left leg Lymphedema) present.  Neurological: She is alert and oriented to person, place, and time.  Psychiatric: She has a normal mood and affect. Her behavior is normal. Judgment and thought content normal.    BP 119/77   Pulse 75   Ht 5' 3"  (1.6 m)   Wt 205 lb 9.6 oz (93.3 kg)   BMI 36.42 kg/m  Wt Readings from Last 3 Encounters:  01/28/20 205 lb 9.6 oz (93.3 kg)  01/13/20 206 lb (93.4 kg)  09/09/19 216 lb 4.8 oz (98.1 kg)   She lost 11 pounds in 4 months, she was encouraged to continue on her weight loss regimen.  Health Maintenance Due  Topic Date Due  . Hepatitis C Screening  Never done  . HIV Screening  Never done  . TETANUS/TDAP  Never done  . PAP SMEAR-Modifier  Never done    There are no preventive care reminders to display for this patient.  Lab Results  Component Value Date   TSH 1.350 11/20/2018   Lab Results  Component Value Date   WBC 5.1 11/20/2018   HGB 11.9  11/20/2018   HCT 37.0 11/20/2018   MCV 83 11/20/2018   PLT 244 11/20/2018   Lab Results  Component Value Date   NA 142 01/20/2020   K 3.5 01/20/2020   CO2 24 01/20/2020   GLUCOSE 88 01/20/2020   BUN 16 01/20/2020   CREATININE 1.17 (H) 01/20/2020   BILITOT 0.6 01/20/2020   ALKPHOS 71 01/20/2020   AST 21 01/20/2020   ALT 16 01/20/2020   PROT 7.8 01/20/2020   ALBUMIN 4.3 01/20/2020   CALCIUM 9.9 01/20/2020   ANIONGAP 7 06/30/2018   Lab Results  Component Value Date   CHOL 144 01/20/2020   Lab Results  Component Value Date   HDL 50 01/20/2020   Lab Results  Component Value Date   LDLCALC 80 01/20/2020   Lab Results  Component Value Date   TRIG 68 01/20/2020   Lab Results  Component Value Date   CHOLHDL 2.9 01/20/2020   Lab Results  Component Value Date   HGBA1C 5.9 (H) 01/20/2020      Assessment & Plan:   1. Prediabetes - Her HgbA1c was 5.9 5, and she will continue on current treatment regimen. She was advised to continue on low carb/non concentrated sweet diet, exercise as tolerated. - metFORMIN (GLUCOPHAGE) 1000 MG tablet; Take 0.5 tablets (500 mg total) by mouth daily with breakfast.  Dispense: 90 tablet; Refill: 0  2. Lymphedema of left leg - She was advised to continue wearing her support stockings, and follow up with Vascular Surgery.  3. Essential hypertension - Her blood pressure is under control and she will continue on current treatment regimen. To continue on DASH diet and exercise as tolerated. - potassium chloride (KLOR-CON) 10 MEQ tablet; Take 2 tablets (20 mEq total) by mouth daily.  Dispense: 180 tablet; Refill: 0 - She was encouraged to increase fluid intake and will recheck Basic Metabolic Panel (BMET); Future  4. Health care maintenance - Ambulatory referral to Hematology / Oncology for Pap Smear.     Follow-up: Return in about 8 weeks (  around 03/22/2020), or if symptoms worsen or fail to improve.    Zendaya Groseclose Jerold Coombe, NP

## 2020-01-28 NOTE — Patient Instructions (Signed)
Hypertension, Adult Hypertension is another name for high blood pressure. High blood pressure forces your heart to work harder to pump blood. This can cause problems over time. There are two numbers in a blood pressure reading. There is a top number (systolic) over a bottom number (diastolic). It is best to have a blood pressure that is below 120/80. Healthy choices can help lower your blood pressure, or you may need medicine to help lower it. What are the causes? The cause of this condition is not known. Some conditions may be related to high blood pressure. What increases the risk?  Smoking.  Having type 2 diabetes mellitus, high cholesterol, or both.  Not getting enough exercise or physical activity.  Being overweight.  Having too much fat, sugar, calories, or salt (sodium) in your diet.  Drinking too much alcohol.  Having long-term (chronic) kidney disease.  Having a family history of high blood pressure.  Age. Risk increases with age.  Race. You may be at higher risk if you are African American.  Gender. Men are at higher risk than women before age 45. After age 65, women are at higher risk than men.  Having obstructive sleep apnea.  Stress. What are the signs or symptoms?  High blood pressure may not cause symptoms. Very high blood pressure (hypertensive crisis) may cause: ? Headache. ? Feelings of worry or nervousness (anxiety). ? Shortness of breath. ? Nosebleed. ? A feeling of being sick to your stomach (nausea). ? Throwing up (vomiting). ? Changes in how you see. ? Very bad chest pain. ? Seizures. How is this treated?  This condition is treated by making healthy lifestyle changes, such as: ? Eating healthy foods. ? Exercising more. ? Drinking less alcohol.  Your health care provider may prescribe medicine if lifestyle changes are not enough to get your blood pressure under control, and if: ? Your top number is above 130. ? Your bottom number is above  80.  Your personal target blood pressure may vary. Follow these instructions at home: Eating and drinking   If told, follow the DASH eating plan. To follow this plan: ? Fill one half of your plate at each meal with fruits and vegetables. ? Fill one fourth of your plate at each meal with whole grains. Whole grains include whole-wheat pasta, brown rice, and whole-grain bread. ? Eat or drink low-fat dairy products, such as skim milk or low-fat yogurt. ? Fill one fourth of your plate at each meal with low-fat (lean) proteins. Low-fat proteins include fish, chicken without skin, eggs, beans, and tofu. ? Avoid fatty meat, cured and processed meat, or chicken with skin. ? Avoid pre-made or processed food.  Eat less than 1,500 mg of salt each day.  Do not drink alcohol if: ? Your doctor tells you not to drink. ? You are pregnant, may be pregnant, or are planning to become pregnant.  If you drink alcohol: ? Limit how much you use to:  0-1 drink a day for women.  0-2 drinks a day for men. ? Be aware of how much alcohol is in your drink. In the [place] one drink equals one 12 oz bottle of beer (355 mL), one 5 oz glass of wine (148 mL), or one 1 oz glass of hard liquor (44 mL). Lifestyle   Work with your doctor to stay at a healthy weight or to lose weight. Ask your doctor what the best weight is for you.  Get at least 30 minutes of exercise most   days of the week. This may include walking, swimming, or biking.  Get at least 30 minutes of exercise that strengthens your muscles (resistance exercise) at least 3 days a week. This may include lifting weights or doing Pilates.  Do not use any products that contain nicotine or tobacco, such as cigarettes, e-cigarettes, and chewing tobacco. If you need help quitting, ask your doctor.  Check your blood pressure at home as told by your doctor.  Keep all follow-up visits as told by your doctor. This is important. Medicines  Take over-the-counter  and prescription medicines only as told by your doctor. Follow directions carefully.  Do not skip doses of blood pressure medicine. The medicine does not work as well if you skip doses. Skipping doses also puts you at risk for problems.  Ask your doctor about side effects or reactions to medicines that you should watch for. Contact a doctor if you:  Think you are having a reaction to the medicine you are taking.  Have headaches that keep coming back (recurring).  Feel dizzy.  Have swelling in your ankles.  Have trouble with your vision. Get help right away if you:  Get a very bad headache.  Start to feel mixed up (confused).  Feel weak or numb.  Feel faint.  Have very bad pain in your: ? Chest. ? Belly (abdomen).  Throw up more than once.  Have trouble breathing. Summary  Hypertension is another name for high blood pressure.  High blood pressure forces your heart to work harder to pump blood.  For most people, a normal blood pressure is less than 120/80.  Making healthy choices can help lower blood pressure. If your blood pressure does not get lower with healthy choices, you may need to take medicine. This information is not intended to replace advice given to you by your health care provider. Make sure you discuss any questions you have with your health care provider. Document Revised: 07/16/2018 Document Reviewed: 07/16/2018 Elsevier Patient Education  2020 Elsevier Inc.   Prediabetes Eating Plan Prediabetes is a condition that causes blood sugar (glucose) levels to be higher than normal. This increases the risk for developing diabetes. In order to prevent diabetes from developing, your health care provider may recommend a diet and other lifestyle changes to help you:  Control your blood glucose levels.  Improve your cholesterol levels.  Manage your blood pressure. Your health care provider may recommend working with a diet and nutrition specialist  (dietitian) to make a meal plan that is best for you. What are tips for following this plan? Lifestyle  Set weight loss goals with the help of your health care team. It is recommended that most people with prediabetes lose 7% of their current body weight.  Exercise for at least 30 minutes at least 5 days a week.  Attend a support group or seek ongoing support from a mental health counselor.  Take over-the-counter and prescription medicines only as told by your health care provider. Reading food labels  Read food labels to check the amount of fat, salt (sodium), and sugar in prepackaged foods. Avoid foods that have: ? Saturated fats. ? Trans fats. ? Added sugars.  Avoid foods that have more than 300 milligrams (mg) of sodium per serving. Limit your daily sodium intake to less than 2,300 mg each day. Shopping  Avoid buying pre-made and processed foods. Cooking  Cook with olive oil. Do not use butter, lard, or ghee.  Bake, broil, grill, or boil foods. Avoid   frying. Meal planning   Work with your dietitian to develop an eating plan that is right for you. This may include: ? Tracking how many calories you take in. Use a food diary, notebook, or mobile application to track what you eat at each meal. ? Using the glycemic index (GI) to plan your meals. The index tells you how quickly a food will raise your blood glucose. Choose low-GI foods. These foods take a longer time to raise blood glucose.  Consider following a Mediterranean diet. This diet includes: ? Several servings each day of fresh fruits and vegetables. ? Eating fish at least twice a week. ? Several servings each day of whole grains, beans, nuts, and seeds. ? Using olive oil instead of other fats. ? Moderate alcohol consumption. ? Eating small amounts of red meat and whole-fat dairy.  If you have high blood pressure, you may need to limit your sodium intake or follow a diet such as the DASH eating plan. DASH is an eating  plan that aims to lower high blood pressure. What foods are recommended? The items listed below may not be a complete list. Talk with your dietitian about what dietary choices are best for you. Grains Whole grains, such as whole-wheat or whole-grain breads, crackers, cereals, and pasta. Unsweetened oatmeal. Bulgur. Barley. Quinoa. Brown rice. Corn or whole-wheat flour tortillas or taco shells. Vegetables Lettuce. Spinach. Peas. Beets. Cauliflower. Cabbage. Broccoli. Carrots. Tomatoes. Squash. Eggplant. Herbs. Peppers. Onions. Cucumbers. Brussels sprouts. Fruits Berries. Bananas. Apples. Oranges. Grapes. Papaya. Mango. Pomegranate. Kiwi. Grapefruit. Cherries. Meats and other protein foods Seafood. Poultry without skin. Lean cuts of pork and beef. Tofu. Eggs. Nuts. Beans. Dairy Low-fat or fat-free dairy products, such as yogurt, cottage cheese, and cheese. Beverages Water. Tea. Coffee. Sugar-free or diet soda. Seltzer water. Lowfat or no-fat milk. Milk alternatives, such as soy or almond milk. Fats and oils Olive oil. Canola oil. Sunflower oil. Grapeseed oil. Avocado. Walnuts. Sweets and desserts Sugar-free or low-fat pudding. Sugar-free or low-fat ice cream and other frozen treats. Seasoning and other foods Herbs. Sodium-free spices. Mustard. Relish. Low-fat, low-sugar ketchup. Low-fat, low-sugar barbecue sauce. Low-fat or fat-free mayonnaise. What foods are not recommended? The items listed below may not be a complete list. Talk with your dietitian about what dietary choices are best for you. Grains Refined white flour and flour products, such as bread, pasta, snack foods, and cereals. Vegetables Canned vegetables. Frozen vegetables with butter or cream sauce. Fruits Fruits canned with syrup. Meats and other protein foods Fatty cuts of meat. Poultry with skin. Breaded or fried meat. Processed meats. Dairy Full-fat yogurt, cheese, or milk. Beverages Sweetened drinks, such as sweet  iced tea and soda. Fats and oils Butter. Lard. Ghee. Sweets and desserts Baked goods, such as cake, cupcakes, pastries, cookies, and cheesecake. Seasoning and other foods Spice mixes with added salt. Ketchup. Barbecue sauce. Mayonnaise. Summary  To prevent diabetes from developing, you may need to make diet and other lifestyle changes to help control blood sugar, improve cholesterol levels, and manage your blood pressure.  Set weight loss goals with the help of your health care team. It is recommended that most people with prediabetes lose 7 percent of their current body weight.  Consider following a Mediterranean diet that includes plenty of fresh fruits and vegetables, whole grains, beans, nuts, seeds, fish, lean meat, low-fat dairy, and healthy oils. This information is not intended to replace advice given to you by your health care provider. Make sure you discuss any questions   you have with your health care provider. Document Revised: 02/27/2019 Document Reviewed: 01/09/2017 Elsevier Patient Education  2020 Elsevier Inc.  

## 2020-02-09 ENCOUNTER — Ambulatory Visit: Payer: Medicaid Other | Admitting: Gerontology

## 2020-02-17 ENCOUNTER — Telehealth: Payer: Self-pay | Admitting: Pharmacy Technician

## 2020-02-17 NOTE — Telephone Encounter (Signed)
Received updated proof of income.  Patient eligible to receive medication assistance at [facility] until time for re-certification in 9359, and as long as eligibility requirements continue to be met.  [facility]

## 2020-02-29 ENCOUNTER — Ambulatory Visit (INDEPENDENT_AMBULATORY_CARE_PROVIDER_SITE_OTHER): Payer: No Typology Code available for payment source | Admitting: Vascular Surgery

## 2020-03-01 ENCOUNTER — Ambulatory Visit
Admission: RE | Admit: 2020-03-01 | Discharge: 2020-03-01 | Disposition: A | Discharge status: Home / Self Care | Payer: Self-pay | Source: Ambulatory Visit | Attending: Specialist | Admitting: Specialist

## 2020-03-01 ENCOUNTER — Ambulatory Visit: Payer: Medicaid Other | Admitting: Specialist

## 2020-03-01 ENCOUNTER — Other Ambulatory Visit: Payer: Self-pay

## 2020-03-01 ENCOUNTER — Ambulatory Visit
Admission: RE | Admit: 2020-03-01 | Discharge: 2020-03-01 | Disposition: A | Discharge status: Home / Self Care | Payer: Self-pay | Attending: Specialist | Admitting: Specialist

## 2020-03-01 ENCOUNTER — Other Ambulatory Visit: Payer: Self-pay | Admitting: Specialist

## 2020-03-01 DIAGNOSIS — M25561 Pain in right knee: Secondary | ICD-10-CM

## 2020-03-01 DIAGNOSIS — M25562 Pain in left knee: Secondary | ICD-10-CM

## 2020-03-01 NOTE — Progress Notes (Signed)
   Subjective:    Patient ID: Kelsey Terry, female    DOB: 08-27-1961, 59 y.o.   MRN: [id number]  HPI 59 year old with several year Hx of R knee pain.   Review of Systems     Objective:   Physical Exam When she first got out of her chair there was a slight hitch. Her gait is good although she doesn't fully extend her knee. She is able to take a few steps on her toes. When she tried to walk on her heels she had increased pain.  Leg alignment on the left is neutral; on the right she has genu valgum. Her tenderness is anterior lateral.   She lacks 10 degrees of active EXT. Her patella tracks normally. In the supine position, there is no effusion. Flexion to 120 degrees, equal to the opposite side. There is no ligament laxity.       Assessment & Plan:  Probable deg. Arthritis. Will obtain x-rays including standing views and see her in 2 weeks.

## 2020-03-03 ENCOUNTER — Other Ambulatory Visit: Payer: Self-pay

## 2020-03-03 ENCOUNTER — Encounter: Payer: Self-pay | Admitting: Cardiovascular Disease

## 2020-03-03 ENCOUNTER — Encounter (INDEPENDENT_AMBULATORY_CARE_PROVIDER_SITE_OTHER): Payer: Self-pay | Admitting: Vascular Surgery

## 2020-03-03 ENCOUNTER — Ambulatory Visit (INDEPENDENT_AMBULATORY_CARE_PROVIDER_SITE_OTHER): Payer: Self-pay | Admitting: Cardiovascular Disease

## 2020-03-03 ENCOUNTER — Ambulatory Visit (INDEPENDENT_AMBULATORY_CARE_PROVIDER_SITE_OTHER): Payer: No Typology Code available for payment source | Admitting: Vascular Surgery

## 2020-03-03 VITALS — BP 118/78 | HR 67 | Ht 63.0 in | Wt 198.0 lb

## 2020-03-03 VITALS — BP 121/75 | HR 80 | Resp 16 | Ht 63.0 in | Wt 198.0 lb

## 2020-03-03 DIAGNOSIS — I2510 Atherosclerotic heart disease of native coronary artery without angina pectoris: Secondary | ICD-10-CM

## 2020-03-03 DIAGNOSIS — I739 Peripheral vascular disease, unspecified: Secondary | ICD-10-CM

## 2020-03-03 DIAGNOSIS — I890 Lymphedema, not elsewhere classified: Secondary | ICD-10-CM

## 2020-03-03 DIAGNOSIS — E785 Hyperlipidemia, unspecified: Secondary | ICD-10-CM

## 2020-03-03 DIAGNOSIS — I214 Non-ST elevation (NSTEMI) myocardial infarction: Secondary | ICD-10-CM

## 2020-03-03 DIAGNOSIS — I872 Venous insufficiency (chronic) (peripheral): Secondary | ICD-10-CM

## 2020-03-03 DIAGNOSIS — I10 Essential (primary) hypertension: Secondary | ICD-10-CM

## 2020-03-03 MED ORDER — CHLORTHALIDONE 25 MG PO TABS: 12.5000 mg | ORAL_TABLET | Freq: Every day | ORAL | 1 refills | Status: DC

## 2020-03-03 NOTE — Progress Notes (Signed)
Cardiology Office Note   Date:  03/03/2020   ID:  Kelsey Terry, DOB Feb 15, 1961, MRN [id number]  PCP:  Langston Reusing, NP  Cardiologist:   Kathlyn Sacramento, MD   Chief Complaint  Patient presents with  . Other    6 mth f/u      History of Present Illness: Kelsey Terry is a 59 y.o. female who presents for a follow up visit regarding coronary artery disease and peripheral arterial disease. She had non-ST elevation myocardial infarction in June 2019.  Cardiac catheterization showed severe two-vessel coronary artery disease involving proximal LAD and mid RCA.  Both were treated with PCI and drug-eluting stent placement.  Echocardiogram showed normal LV systolic function. She was noted to have peripheral arterial disease.  ABI was moderately reduced bilaterally with evidence of bilateral SFA occlusion.  She had no claudication and thus was treated medically.  She has been doing well with no recent chest pain, shortness of breath or palpitations.  No leg claudication.  She takes her medications regularly.  Her diabetes has been well controlled with most recent hemoglobin A1c of 5.9.  She is having issues with her right knee and currently seeing orthopedics for that.    Past Medical History:  Diagnosis Date  . Allergies   . Asthma   . Blood dyscrasia    blood clot in right leg  . CAD (coronary artery disease) 05/05/2019  . CAD in native artery    a. NSTEMI 05/16/18: Verlot 05/16/18 - ost-pLAD 95% s/p PCI/DES, mLAD 30%, ostD1 70%, ost-pLCx 30%, mRCA 90%, dRCA 20%; b. staged PCI/DES to [facility] 7/1  . Complication of anesthesia    patient stated she had "some kind of reaction to anesthesia, "thought she was having a stroke" during her second cath procedure.  . Diastolic dysfunction    a. TTE 05/16/18: EF 55-60%, no RWMA, Gr1DD, trivial AI, calcified mitral annulus, mild MR, mildly dilated LA  . Dyspnea    with activity  . Hypertension   . Lymphedema    LLE  . Pericarditis    a. ~ 2012 in  [place], [place] s/p pericardiocentesis   . Wears contact lenses     Past Surgical History:  Procedure Laterality Date  . ABDOMINAL HYSTERECTOMY    . CESAREAN SECTION    . COLONOSCOPY WITH PROPOFOL N/A 08/13/2019   Procedure: COLONOSCOPY WITH BIOPSIES;  Surgeon: Lucilla Lame, MD;  Location: [facility];  Service: Endoscopy;  Laterality: N/A;  . CORONARY STENT INTERVENTION N/A 05/16/2018   Procedure: CORONARY STENT INTERVENTION;  Surgeon: Wellington Hampshire, MD;  Location: [facility];  Service: Cardiovascular;  Laterality: N/A;  . CORONARY STENT INTERVENTION N/A 05/19/2018   Procedure: CORONARY STENT INTERVENTION;  Surgeon: Wellington Hampshire, MD;  Location: [facility];  Service: Cardiovascular;  Laterality: N/A;  . LEFT HEART CATH AND CORONARY ANGIOGRAPHY N/A 05/16/2018   Procedure: LEFT HEART CATH AND CORONARY ANGIOGRAPHY;  Surgeon: Wellington Hampshire, MD;  Location: [facility];  Service: Cardiovascular;  Laterality: N/A;  . PERICARDIOCENTESIS    . POLYPECTOMY N/A 08/13/2019   Procedure: POLYPECTOMY;  Surgeon: Lucilla Lame, MD;  Location: [facility];  Service: Endoscopy;  Laterality: N/A;     Current Outpatient Medications  Medication Sig Dispense Refill  . aspirin EC 81 MG tablet Take 1 tablet (81 mg total) by mouth daily. 90 tablet 1  . Blood Pressure KIT 1 kit by Does not apply route daily. 1 kit  0  . CALCIUM CITRATE-VITAMIN D PO Take 1 tablet by mouth 2 (two) times daily.    . chlorthalidone (HYGROTON) 25 MG tablet Take 1 tablet (25 mg total) by mouth daily. Patient currently takes 25 mg every other day (Per Dr. Sophronia Simas). 90 tablet 1  . losartan (COZAAR) 100 MG tablet Patient no longer taking HCTZ 90 tablet 1  . metFORMIN (GLUCOPHAGE) 500 MG tablet Take 500 mg by mouth daily.    . Multiple Vitamin (MULTIVITAMIN) tablet Take 1 tablet by mouth daily.    . potassium chloride (KLOR-CON) 10 MEQ tablet Take 2 tablets (20 mEq total) by mouth daily. 180  tablet 0  . rosuvastatin (CRESTOR) 40 MG tablet Take 1 tablet (40 mg total) by mouth daily. 90 tablet 1   No current facility-administered medications for this visit.    Allergies:   Amoxicillin and Erythromycin    Social History:  The patient  reports that she has never smoked. She has never used smokeless tobacco. She reports previous alcohol use. She reports that she does not use drugs.   Family History:  The patient's family history includes Diabetes in her brother; Hypertension in her mother; Pancreatic cancer in her father.    ROS:  Please see the history of present illness.   Otherwise, review of systems are positive for none.   All other systems are reviewed and negative.    PHYSICAL EXAM: VS:  BP 118/78   Pulse 67   Ht 5' 3"  (1.6 m)   Wt 198 lb (89.8 kg)   SpO2 98%   BMI 35.07 kg/m  , BMI Body mass index is 35.07 kg/m. GEN: Well nourished, well developed, in no acute distress  HEENT: normal  Neck: no JVD, carotid bruits, or masses Cardiac: RRR; no murmurs, rubs, or gallops, chronic lymphedema of the legs Respiratory:  clear to auscultation bilaterally, normal work of breathing GI: soft, nontender, nondistended, + BS MS: no deformity or atrophy  Skin: warm and dry, no rash Neuro:  Strength and sensation are intact Psych: euthymic mood, full affect   EKG:  EKG is ordered today. The ekg ordered today demonstrates normal sinus rhythm with no significant ST or T wave changes.   Recent Labs: 01/20/2020: ALT 16; BUN 16; Creatinine, Ser 1.17; Potassium 3.5; Sodium 142    Lipid Panel    Component Value Date/Time   CHOL 144 01/20/2020 1137   TRIG 68 01/20/2020 1137   HDL 50 01/20/2020 1137   CHOLHDL 2.9 01/20/2020 1137   CHOLHDL 4.2 05/16/2018 0009   VLDL 9 05/16/2018 0009   LDLCALC 80 01/20/2020 1137      Wt Readings from Last 3 Encounters:  03/03/20 198 lb (89.8 kg)  01/28/20 205 lb 9.6 oz (93.3 kg)  01/13/20 206 lb (93.4 kg)        No flowsheet data  found.    ASSESSMENT AND PLAN:  1.  Coronary artery disease involving native coronary arteries without angina: She is doing well overall with no anginal symptoms.  Continue medical therapy.   2.  Peripheral arterial disease: Bilateral occlusion of SFA.  No claudication.  Continue medical therapy.  3.  Hyperlipidemia: Continue high-dose rosuvastatin.  I reviewed most recent lipid profile done last month which showed an LDL of 80 which is close to target and a triglyceride of 68.  4.  Essential hypertension: Blood pressure is well controlled.  She takes chlorthalidone 25 mg every other day and I changed that to 12.5 mg once  daily.   Disposition:   FU with me in 12 months  Signed,  Kathlyn Sacramento, MD  03/03/2020 10:34 AM    [place]

## 2020-03-03 NOTE — Patient Instructions (Signed)
Medication Instructions:  Your physician has recommended you make the following change in your medication:   CHANGE Chlorthalidone to 12.5 (1/2 tablet) daily.   *If you need a refill on your cardiac medications before your next appointment, please call your pharmacy*   Lab Work: None ordered If you have labs (blood work) drawn today and your tests are completely normal, you will receive your results only by: Marland Kitchen MyChart Message (if you have MyChart) OR . A paper copy in the mail If you have any lab test that is abnormal or we need to change your treatment, we will call you to review the results.   Testing/Procedures: None ordered   Follow-Up: At [facility], you and your health needs are our priority.  As part of our continuing mission to provide you with exceptional heart care, we have created designated Provider Care Teams.  These Care Teams include your primary Cardiologist (physician) and Advanced Practice Providers (APPs -  Physician Assistants and Nurse Practitioners) who all work together to provide you with the care you need, when you need it.  We recommend signing up for the patient portal called "MyChart".  Sign up information is provided on this After Visit Summary.  MyChart is used to connect with patients for Virtual Visits (Telemedicine).  Patients are able to view lab/test results, encounter notes, upcoming appointments, etc.  Non-urgent messages can be sent to your provider as well.   To learn more about what you can do with MyChart, go to [email or web address].    Your next appointment:   12 month(s)  The format for your next appointment:   In Person  Provider:    You may see Kathlyn Sacramento, MD or one of the following Advanced Practice Providers on your designated Care Team:    Murray Hodgkins, NP  Christell Faith, PA-C  Marrianne Mood, PA-C    Other Instructions N/A

## 2020-03-10 ENCOUNTER — Encounter (INDEPENDENT_AMBULATORY_CARE_PROVIDER_SITE_OTHER): Payer: Self-pay | Admitting: Vascular Surgery

## 2020-03-10 ENCOUNTER — Encounter (INDEPENDENT_AMBULATORY_CARE_PROVIDER_SITE_OTHER): Payer: Self-pay

## 2020-03-10 NOTE — Progress Notes (Signed)
MRN : [id number]  Kelsey Terry is a 59 y.o. (November 22, 1960) female who presents with chief complaint of  Chief Complaint  Patient presents with  . Follow-up    6 mo f/u, no studies   .  History of Present Illness:  The patient returns to the office for followup evaluation regarding leg swelling.  The swelling has improved quite a bit and the pain associated with swelling has decreased substantially. There have not been any interval development of a ulcerations or wounds.  Since the previous visit the patient has been wearing graduated compression stockings and has noted little significant improvement in the lymphedema. The patient has been using compression routinely morning until night.  The patient also states elevation during the day and exercise is being done too.     Current Meds  Medication Sig  . aspirin EC 81 MG tablet Take 1 tablet (81 mg total) by mouth daily.  . Blood Pressure KIT 1 kit by Does not apply route daily.  Marland Kitchen CALCIUM CITRATE-VITAMIN D PO Take 1 tablet by mouth 2 (two) times daily.  . chlorthalidone (HYGROTON) 25 MG tablet Take 0.5 tablets (12.5 mg total) by mouth daily.  Marland Kitchen losartan (COZAAR) 100 MG tablet Patient no longer taking HCTZ  . metFORMIN (GLUCOPHAGE) 500 MG tablet Take 500 mg by mouth daily.  . Multiple Vitamin (MULTIVITAMIN) tablet Take 1 tablet by mouth daily.  . potassium chloride (KLOR-CON) 10 MEQ tablet Take 2 tablets (20 mEq total) by mouth daily.  . rosuvastatin (CRESTOR) 40 MG tablet Take 1 tablet (40 mg total) by mouth daily.    Past Medical History:  Diagnosis Date  . Allergies   . Asthma   . Blood dyscrasia    blood clot in right leg  . CAD (coronary artery disease) 05/05/2019  . CAD in native artery    a. NSTEMI 05/16/18: [place] 05/16/18 - ost-pLAD 95% s/p PCI/DES, mLAD 30%, ostD1 70%, ost-pLCx 30%, mRCA 90%, dRCA 20%; b. staged PCI/DES to [facility] 7/1  . Complication of anesthesia    patient stated she had "some kind of reaction to  anesthesia, "thought she was having a stroke" during her second cath procedure.  . Diastolic dysfunction    a. TTE 05/16/18: EF 55-60%, no RWMA, Gr1DD, trivial AI, calcified mitral annulus, mild MR, mildly dilated LA  . Dyspnea    with activity  . Hypertension   . Lymphedema    LLE  . Pericarditis    a. ~ 2012 in [place], [place] s/p pericardiocentesis   . Wears contact lenses     Past Surgical History:  Procedure Laterality Date  . ABDOMINAL HYSTERECTOMY    . CESAREAN SECTION    . COLONOSCOPY WITH PROPOFOL N/A 08/13/2019   Procedure: COLONOSCOPY WITH BIOPSIES;  Surgeon: Lucilla Lame, MD;  Location: [facility];  Service: Endoscopy;  Laterality: N/A;  . CORONARY STENT INTERVENTION N/A 05/16/2018   Procedure: CORONARY STENT INTERVENTION;  Surgeon: Wellington Hampshire, MD;  Location: [place] [facility];  Service: Cardiovascular;  Laterality: N/A;  . CORONARY STENT INTERVENTION N/A 05/19/2018   Procedure: CORONARY STENT INTERVENTION;  Surgeon: Wellington Hampshire, MD;  Location: [place] [facility];  Service: Cardiovascular;  Laterality: N/A;  . LEFT HEART CATH AND CORONARY ANGIOGRAPHY N/A 05/16/2018   Procedure: LEFT HEART CATH AND CORONARY ANGIOGRAPHY;  Surgeon: Wellington Hampshire, MD;  Location: [place] [facility];  Service: Cardiovascular;  Laterality: N/A;  . PERICARDIOCENTESIS    . POLYPECTOMY N/A 08/13/2019  Procedure: POLYPECTOMY;  Surgeon: Lucilla Lame, MD;  Location: [facility];  Service: Endoscopy;  Laterality: N/A;    Social History Social History   Tobacco Use  . Smoking status: Never Smoker  . Smokeless tobacco: Never Used  Substance Use Topics  . Alcohol use: Not Currently  . Drug use: Never    Family History Family History  Problem Relation Age of Onset  . Hypertension Mother   . Pancreatic cancer Father   . Diabetes Brother   . Breast cancer Neg Hx     Allergies  Allergen Reactions  . Amoxicillin Itching and Other (See Comments)    Reaction:  bump in vaginal region Has patient had a PCN reaction causing immediate rash, facial/tongue/throat swelling, SOB or lightheadedness with hypotension: No Has patient had a PCN reaction causing severe rash involving mucus membranes or skin necrosis: No Has patient had a PCN reaction that required hospitalization: No Has patient had a PCN reaction occurring within the last 10 years: No If all of the above answers are "NO", then may proceed with Cephalosporin use.   . Erythromycin     Hives, rash.       REVIEW OF SYSTEMS (Negative unless checked)  Constitutional: [] Weight loss  [] Fever  [] Chills Cardiac: [] Chest pain   [] Chest pressure   [] Palpitations   [] Shortness of breath when laying flat   [] Shortness of breath with exertion. Vascular:  [] Pain in legs with walking   [x] Pain in legs at rest  [] History of DVT   [] Phlebitis   [x] Swelling in legs   [] Varicose veins   [] Non-healing ulcers Pulmonary:   [] Uses home oxygen   [] Productive cough   [] Hemoptysis   [] Wheeze  [] COPD   [] Asthma Neurologic:  [] Dizziness   [] Seizures   [] History of stroke   [] History of TIA  [] Aphasia   [] Vissual changes   [] Weakness or numbness in arm   [] Weakness or numbness in leg Musculoskeletal:   [] Joint swelling   [] Joint pain   [] Low back pain Hematologic:  [] Easy bruising  [] Easy bleeding   [] Hypercoagulable state   [] Anemic Gastrointestinal:  [] Diarrhea   [] Vomiting  [] Gastroesophageal reflux/heartburn   [] Difficulty swallowing. Genitourinary:  [] Chronic kidney disease   [] Difficult urination  [] Frequent urination   [] Blood in urine Skin:  [] Rashes   [] Ulcers  Psychological:  [] History of anxiety   []  History of major depression.  Physical Examination  Vitals:   03/03/20 1327  BP: 121/75  Pulse: 80  Resp: 16  Weight: 198 lb (89.8 kg)  Height: 5' 3"  (1.6 m)   Body mass index is 35.07 kg/m. Gen: WD/WN, NAD Head: [place]/AT, No temporalis wasting.  Ear/Nose/Throat: Hearing grossly intact, nares w/o erythema  or drainage Eyes: PER, EOMI, sclera nonicteric.  Neck: Supple, no large masses.   Pulmonary:  Good air movement, no audible wheezing bilaterally, no use of accessory muscles.  Cardiac: RRR, no JVD Vascular: scattered varicosities present bilaterally.  Mild venous stasis changes to the legs bilaterally.  2-3+ soft pitting edema Vessel Right Left  Radial Palpable Palpable  Gastrointestinal: Non-distended. No guarding/no peritoneal signs.  Musculoskeletal: M/S 5/5 throughout.  No deformity or atrophy.  Neurologic: CN 2-12 intact. Symmetrical.  Speech is fluent. Motor exam as listed above. Psychiatric: Judgment intact, Mood & affect appropriate for pt's clinical situation. Dermatologic: No rashes or ulcers noted.  No changes consistent with cellulitis. Lymph : No lichenification or skin changes of chronic lymphedema.  CBC Lab Results  Component Value Date   WBC  5.1 11/20/2018   HGB 11.9 11/20/2018   HCT 37.0 11/20/2018   MCV 83 11/20/2018   PLT 244 11/20/2018    BMET    Component Value Date/Time   NA 142 01/20/2020 1137   K 3.5 01/20/2020 1137   CL 102 01/20/2020 1137   CO2 24 01/20/2020 1137   GLUCOSE 88 01/20/2020 1137   GLUCOSE 109 (H) 06/30/2018 1235   BUN 16 01/20/2020 1137   CREATININE 1.17 (H) 01/20/2020 1137   CALCIUM 9.9 01/20/2020 1137   GFRNONAA 51 (L) 01/20/2020 1137   GFRAA 59 (L) 01/20/2020 1137   CrCl cannot be calculated (Patient's most recent lab result is older than the maximum 21 days allowed.).  COAG Lab Results  Component Value Date   INR 0.94 05/16/2018    Radiology DG Knee Complete 4 Views Left  Result Date: 03/01/2020 CLINICAL DATA:  Chronic bilateral knee pain. EXAM: LEFT KNEE - COMPLETE 4+ VIEW COMPARISON:  None. FINDINGS: No evidence of fracture, dislocation, or joint effusion. Moderate narrowing of the medial and patellofemoral spaces are noted. Soft tissues are unremarkable. IMPRESSION: Moderate degenerative joint disease. No acute  abnormality seen in the left knee. Electronically Signed   By: Marijo Conception M.D.   On: 03/01/2020 21:42   DG Knee Complete 4 Views Right  Result Date: 03/01/2020 CLINICAL DATA:  Chronic bilateral knee pain. EXAM: RIGHT KNEE - COMPLETE 4+ VIEW COMPARISON:  None. FINDINGS: No evidence of fracture, dislocation, or joint effusion. Mild narrowing of lateral joint space is noted. Mild narrowing of patellofemoral space is noted with associated spurring. Soft tissues are unremarkable. IMPRESSION: Mild degenerative joint disease. No acute abnormality seen in the right knee. Electronically Signed   By: Marijo Conception M.D.   On: 03/01/2020 21:44     Assessment/Plan 1. Lymphedema No surgery or intervention at this point in time.    I have reviewed my discussion with the patient regarding venous insufficiency and secondary lymph edema and why it  causes symptoms. I have discussed with the patient the chronic skin changes that accompany these problems and the long term sequela such as ulceration and infection.  Patient will continue wearing graduated compression stockings class 1 (20-30 mmHg) on a daily basis a prescription was given to the patient to keep this updated. The patient will  put the stockings on first thing in the morning and removing them in the evening. The patient is instructed specifically not to sleep in the stockings.  In addition, behavioral modification including elevation during the day will be continued.  Diet and salt restriction was also discussed.  Previous duplex ultrasound of the lower extremities shows normal deep venous system, superficial reflux was not present.   Following the review of the ultrasound the patient will follow up in 12 months to reassess the degree of swelling and the control that graduated compression is offering.   The patient can be assessed for a Lymph Pump at that time.  However, at this time the patient states they are satisfied with the control compression  and elevation is yielding.    2. Chronic venous insufficiency No surgery or intervention at this point in time.    I have reviewed my discussion with the patient regarding venous insufficiency and secondary lymph edema and why it  causes symptoms. I have discussed with the patient the chronic skin changes that accompany these problems and the long term sequela such as ulceration and infection.  Patient will continue wearing graduated compression  stockings class 1 (20-30 mmHg) on a daily basis a prescription was given to the patient to keep this updated. The patient will  put the stockings on first thing in the morning and removing them in the evening. The patient is instructed specifically not to sleep in the stockings.  In addition, behavioral modification including elevation during the day will be continued.  Diet and salt restriction was also discussed.  Previous duplex ultrasound of the lower extremities shows normal deep venous system, superficial reflux was not present.   Following the review of the ultrasound the patient will follow up in 12 months to reassess the degree of swelling and the control that graduated compression is offering.   The patient can be assessed for a Lymph Pump at that time.  However, at this time the patient states they are satisfied with the control compression and elevation is yielding.    3. Essential hypertension Continue antihypertensive medications as already ordered, these medications have been reviewed and there are no changes at this time.   4. NSTEMI (non-ST elevated myocardial infarction) ([place]) Continue cardiac and antihypertensive medications as already ordered and reviewed, no changes at this time.  Continue statin as ordered and reviewed, no changes at this time  Nitrates PRN for chest pain     Hortencia Pilar, MD  03/10/2020 11:20 AM

## 2020-03-10 NOTE — Telephone Encounter (Signed)
I'm not certain what this is regarding so you will likely need to talk to Dr. Delana [facility] directly

## 2020-03-15 ENCOUNTER — Ambulatory Visit: Payer: Medicaid Other | Admitting: Specialist

## 2020-03-15 ENCOUNTER — Other Ambulatory Visit: Payer: Self-pay

## 2020-03-15 DIAGNOSIS — M25561 Pain in right knee: Secondary | ICD-10-CM

## 2020-03-15 DIAGNOSIS — M25562 Pain in left knee: Secondary | ICD-10-CM

## 2020-03-15 MED ORDER — MELOXICAM 15 MG PO TABS: 15.0000 mg | ORAL_TABLET | Freq: Every day | ORAL | 0 refills | Status: DC

## 2020-03-15 NOTE — Progress Notes (Signed)
   Subjective:    Patient ID: Kelsey Terry, female    DOB: 07-19-1961, 59 y.o.   MRN: [id number]  HPI I have reviewed her x-rays, her R knee has mod. Narrowing of the lateral compartment, and min. PF changes. On her asymptomatic R side, she has more changes on the medial side and the PF joint.    Review of Systems     Objective:   Physical Exam  Deferred       Assessment & Plan:  Meloxican 1 QD x 30 days. Return in a month.

## 2020-03-16 ENCOUNTER — Other Ambulatory Visit: Payer: Medicaid Other

## 2020-03-16 ENCOUNTER — Other Ambulatory Visit: Payer: Self-pay

## 2020-03-16 DIAGNOSIS — I10 Essential (primary) hypertension: Secondary | ICD-10-CM

## 2020-03-17 ENCOUNTER — Telehealth: Payer: Self-pay | Admitting: Gerontology

## 2020-03-17 LAB — BASIC METABOLIC PANEL
BUN/Creatinine Ratio: 14 (ref 12–28)
BUN: 16 mg/dL (ref 8–27)
CO2: 26 mmol/L (ref 20–29)
Calcium: 10.1 mg/dL (ref 8.7–10.3)
Chloride: 103 mmol/L (ref 96–106)
Creatinine, Ser: 1.11 mg/dL — ABNORMAL HIGH (ref 0.57–1.00)
GFR calc Af Amer: 62 mL/min/{1.73_m2} (ref >59)
GFR calc non Af Amer: 54 mL/min/{1.73_m2} — ABNORMAL LOW (ref >59)
Glucose: 96 mg/dL (ref 65–99)
Potassium: 3.4 mmol/L — ABNORMAL LOW (ref 3.5–5.2)
Sodium: 143 mmol/L (ref 134–144)

## 2020-03-17 NOTE — Telephone Encounter (Signed)
When calling pt for appt reminder, also made her an appt with Dr. Vickki Hearing. If he has openings before June 29, call pt and let her know - MD

## 2020-03-22 ENCOUNTER — Ambulatory Visit: Payer: Medicaid Other | Admitting: Gerontology

## 2020-03-22 ENCOUNTER — Other Ambulatory Visit: Payer: Self-pay

## 2020-03-22 DIAGNOSIS — M25561 Pain in right knee: Secondary | ICD-10-CM

## 2020-03-22 DIAGNOSIS — I10 Essential (primary) hypertension: Secondary | ICD-10-CM

## 2020-03-22 DIAGNOSIS — Z0000 Encounter for general adult medical examination without abnormal findings: Secondary | ICD-10-CM

## 2020-03-22 NOTE — Progress Notes (Signed)
Established Patient Office Visit  Subjective:  Patient ID: Chae Shuster, female    DOB: Apr 27, 1961  Age: 59 y.o. MRN: [id number]  CC: No chief complaint on file.  Patient consents to telephone visit and 2 patient's identifiers was used to identify patient.  HPI Allyn Bartelson presents for follow up of hypertension and  lab review. She states that she's compliant with her medication, and continues to make healthy life style modifications. She had Xray to bilateral knee done on 03/01/2020 and it showed , Moderate degenerative joint disease. No acute abnormality seen in the left knee,Mild degenerative joint disease. No acute abnormality seen in the right knee per Dr Roxan Diesel. She followed up with Dr Vickki Hearing on 03/15/2020 and Meloxicam 15 mg daily was recommended. Currently, she states that her bilateral knee pain has subsided since taking the Meloxicam. She was seen on 03/03/2020 by the Cardiologist Dr Kathlyn [place], EKG showed normal sinus rhythm with no significant ST or T wave changes and her Chlorthalidone was decreased to 12.5 mg daily. She was equally seen by Vascular Surgery, Dr Katha Cabal on 03/03/2020 with regards to leg Lymphedema, she will continue to wear graduated compression stocking class 1 (20-30 mm/hg). Her BMP done on 03/16/2020, Serum creatinine decreased from 1.17 mg/dl to 1.11 mg/dl and eGFR calc non Af Amer increased from 51 to 54, her potasium was 3.4. She denies chest pain, palpitation, myalgia, claudication  And cough. Overall, she states that she's doing well and offers no further complaint.  Past Medical History:  Diagnosis Date  . Allergies   . Asthma   . Blood dyscrasia    blood clot in right leg  . CAD (coronary artery disease) 05/05/2019  . CAD in native artery    a. NSTEMI 05/16/18: [place] 05/16/18 - ost-pLAD 95% s/p PCI/DES, mLAD 30%, ostD1 70%, ost-pLCx 30%, mRCA 90%, dRCA 20%; b. staged PCI/DES to [facility] 7/1  . Complication of anesthesia    patient stated she had  "some kind of reaction to anesthesia, "thought she was having a stroke" during her second cath procedure.  . Diastolic dysfunction    a. TTE 05/16/18: EF 55-60%, no RWMA, Gr1DD, trivial AI, calcified mitral annulus, mild MR, mildly dilated LA  . Dyspnea    with activity  . Hypertension   . Lymphedema    LLE  . Pericarditis    a. ~ 2012 in [place], [place] s/p pericardiocentesis   . Wears contact lenses     Past Surgical History:  Procedure Laterality Date  . ABDOMINAL HYSTERECTOMY    . CESAREAN SECTION    . COLONOSCOPY WITH PROPOFOL N/A 08/13/2019   Procedure: COLONOSCOPY WITH BIOPSIES;  Surgeon: Lucilla Lame, MD;  Location: [facility];  Service: Endoscopy;  Laterality: N/A;  . CORONARY STENT INTERVENTION N/A 05/16/2018   Procedure: CORONARY STENT INTERVENTION;  Surgeon: Wellington Hampshire, MD;  Location: [facility] [facility];  Service: Cardiovascular;  Laterality: N/A;  . CORONARY STENT INTERVENTION N/A 05/19/2018   Procedure: CORONARY STENT INTERVENTION;  Surgeon: Wellington Hampshire, MD;  Location: [place] [facility];  Service: Cardiovascular;  Laterality: N/A;  . LEFT HEART CATH AND CORONARY ANGIOGRAPHY N/A 05/16/2018   Procedure: LEFT HEART CATH AND CORONARY ANGIOGRAPHY;  Surgeon: Wellington Hampshire, MD;  Location: [facility];  Service: Cardiovascular;  Laterality: N/A;  . PERICARDIOCENTESIS    . POLYPECTOMY N/A 08/13/2019   Procedure: POLYPECTOMY;  Surgeon: Lucilla Lame, MD;  Location: [facility];  Service:  Endoscopy;  Laterality: N/A;    Family History  Problem Relation Age of Onset  . Hypertension Mother   . Pancreatic cancer Father   . Diabetes Brother   . Breast cancer Neg Hx     Social History   Socioeconomic History  . Marital status: Widowed    Spouse name: Not on file  . Number of children: Not on file  . Years of education: Not on file  . Highest education level: Not on file  Occupational History  . Not on file  Tobacco Use  . Smoking  status: Never Smoker  . Smokeless tobacco: Never Used  Substance and Sexual Activity  . Alcohol use: Not Currently  . Drug use: Never  . Sexual activity: Not on file  Other Topics Concern  . Not on file  Social History Narrative  . Not on file   Social Determinants of Health   Financial Resource Strain: High Risk  . Difficulty of Paying Living Expenses: Very hard  Food Insecurity: Food Insecurity Present  . Worried About Charity fundraiser in the Last Year: Often true  . Ran Out of Food in the Last Year: Often true  Transportation Needs: No Transportation Needs  . Lack of Transportation (Medical): No  . Lack of Transportation (Non-Medical): No  Physical Activity: Unknown  . Days of Exercise per Week: 3 days  . Minutes of Exercise per Session: Not on file  Stress: No Stress Concern Present  . Feeling of Stress : Only a little  Social Connections: Slightly Isolated  . Frequency of Communication with Friends and Family: More than three times a week  . Frequency of Social Gatherings with Friends and Family: More than three times a week  . Attends Religious Services: More than 4 times per year  . Active Member of Clubs or Organizations: Yes  . Attends Archivist Meetings: Never  . Marital Status: Widowed  Intimate Partner Violence: Not At Risk  . Fear of Current or Ex-Partner: No  . Emotionally Abused: No  . Physically Abused: No  . Sexually Abused: No    Outpatient Medications Prior to Visit  Medication Sig Dispense Refill  . aspirin EC 81 MG tablet Take 1 tablet (81 mg total) by mouth daily. 90 tablet 1  . Blood Pressure KIT 1 kit by Does not apply route daily. 1 kit 0  . CALCIUM CITRATE-VITAMIN D PO Take 1 tablet by mouth 2 (two) times daily.    . chlorthalidone (HYGROTON) 25 MG tablet Take 0.5 tablets (12.5 mg total) by mouth daily. 45 tablet 1  . losartan (COZAAR) 100 MG tablet Patient no longer taking HCTZ 90 tablet 1  . meloxicam (MOBIC) 15 MG tablet Take 1  tablet (15 mg total) by mouth daily. 30 tablet 0  . metFORMIN (GLUCOPHAGE) 500 MG tablet Take 500 mg by mouth daily.    . Multiple Vitamin (MULTIVITAMIN) tablet Take 1 tablet by mouth daily.    . potassium chloride (KLOR-CON) 10 MEQ tablet Take 2 tablets (20 mEq total) by mouth daily. 180 tablet 0  . rosuvastatin (CRESTOR) 40 MG tablet Take 1 tablet (40 mg total) by mouth daily. 90 tablet 1   No facility-administered medications prior to visit.    Allergies  Allergen Reactions  . Amoxicillin Itching and Other (See Comments)    Reaction: bump in vaginal region Has patient had a PCN reaction causing immediate rash, facial/tongue/throat swelling, SOB or lightheadedness with hypotension: No Has patient had a PCN  reaction causing severe rash involving mucus membranes or skin necrosis: No Has patient had a PCN reaction that required hospitalization: No Has patient had a PCN reaction occurring within the last 10 years: No If all of the above answers are "NO", then may proceed with Cephalosporin use.   . Erythromycin     Hives, rash.      ROS Review of Systems  Constitutional: Negative.   Eyes: Negative.   Respiratory: Negative.  Negative for wheezing.   Cardiovascular: Positive for leg swelling (Chronic Lymphedema).  Musculoskeletal: Negative.   Neurological: Negative.   Psychiatric/Behavioral: Negative.       Objective:    Physical Exam No physical exam was done. There were no vitals taken for this visit. Wt Readings from Last 3 Encounters:  03/16/20 201 lb 9.6 oz (91.4 kg)  03/03/20 198 lb (89.8 kg)  03/03/20 198 lb (89.8 kg)     Health Maintenance Due  Topic Date Due  . Hepatitis C Screening  Never done  . HIV Screening  Never done  . COVID-19 Vaccine (1) Never done  . TETANUS/TDAP  Never done  . PAP SMEAR-Modifier  Never done    There are no preventive care reminders to display for this patient.  Lab Results  Component Value Date   TSH 1.350 11/20/2018   Lab  Results  Component Value Date   WBC 5.1 11/20/2018   HGB 11.9 11/20/2018   HCT 37.0 11/20/2018   MCV 83 11/20/2018   PLT 244 11/20/2018   Lab Results  Component Value Date   NA 143 03/16/2020   K 3.4 (L) 03/16/2020   CO2 26 03/16/2020   GLUCOSE 96 03/16/2020   BUN 16 03/16/2020   CREATININE 1.11 (H) 03/16/2020   BILITOT 0.6 01/20/2020   ALKPHOS 71 01/20/2020   AST 21 01/20/2020   ALT 16 01/20/2020   PROT 7.8 01/20/2020   ALBUMIN 4.3 01/20/2020   CALCIUM 10.1 03/16/2020   ANIONGAP 7 06/30/2018   Lab Results  Component Value Date   CHOL 144 01/20/2020   Lab Results  Component Value Date   HDL 50 01/20/2020   Lab Results  Component Value Date   LDLCALC 80 01/20/2020   Lab Results  Component Value Date   TRIG 68 01/20/2020   Lab Results  Component Value Date   CHOLHDL 2.9 01/20/2020   Lab Results  Component Value Date   HGBA1C 5.9 (H) 01/20/2020      Assessment & Plan:   1. Essential hypertension - Per patient, her blood pressure is usually in the low 120/80. She will continue on current treatment regimen, was advised to continue on DASH diet and exercise as tolerated. She was also advised to increase water intake.  2. Acute bilateral knee pain - She will continue on Meloxicam 15 mg and follow up with Dr Vickki Hearing   3. Health care maintenance - HgbA1c will be rechecked and BMP. She was encouraged to increase water intake, continue on low carb and non concentrated sweet diet and take her potassium. She will also continue to wear her graduated compression stockings daily. - Basic Metabolic Panel (BMET); Future - HgB A1c; Future     Follow-up: Return in about 8 weeks (around 05/17/2020), or if symptoms worsen or fail to improve.    Alyha Marines Jerold Coombe, NP

## 2020-03-22 NOTE — Patient Instructions (Signed)
DASH Eating Plan DASH stands for "Dietary Approaches to Stop Hypertension." The DASH eating plan is a healthy eating plan that has been shown to reduce high blood pressure (hypertension). It may also reduce your risk for type 2 diabetes, heart disease, and stroke. The DASH eating plan may also help with weight loss. What are tips for following this plan?  General guidelines  Avoid eating more than 2,300 mg (milligrams) of salt (sodium) a day. If you have hypertension, you may need to reduce your sodium intake to 1,500 mg a day.  Limit alcohol intake to no more than 1 drink a day for nonpregnant women and 2 drinks a day for men. One drink equals 12 oz of beer, 5 oz of wine, or 1 oz of hard liquor.  Work with your health care provider to maintain a healthy body weight or to lose weight. Ask what an ideal weight is for you.  Get at least 30 minutes of exercise that causes your heart to beat faster (aerobic exercise) most days of the week. Activities may include walking, swimming, or biking.  Work with your health care provider or diet and nutrition specialist (dietitian) to adjust your eating plan to your individual calorie needs. Reading food labels   Check food labels for the amount of sodium per serving. Choose foods with less than 5 percent of the Daily Value of sodium. Generally, foods with less than 300 mg of sodium per serving fit into this eating plan.  To find whole grains, look for the word "whole" as the first word in the ingredient list. Shopping  Buy products labeled as "low-sodium" or "no salt added."  Buy fresh foods. Avoid canned foods and premade or frozen meals. Cooking  Avoid adding salt when cooking. Use salt-free seasonings or herbs instead of table salt or sea salt. Check with your health care provider or pharmacist before using salt substitutes.  Do not fry foods. Cook foods using healthy methods such as baking, boiling, grilling, and broiling instead.  Cook with  heart-healthy oils, such as olive, canola, soybean, or sunflower oil. Meal planning  Eat a balanced diet that includes: ? 5 or more servings of fruits and vegetables each day. At each meal, try to fill half of your plate with fruits and vegetables. ? Up to 6-8 servings of whole grains each day. ? Less than 6 oz of lean meat, poultry, or fish each day. A 3-oz serving of meat is about the same size as a deck of cards. One egg equals 1 oz. ? 2 servings of low-fat dairy each day. ? A serving of nuts, seeds, or beans 5 times each week. ? Heart-healthy fats. Healthy fats called Omega-3 fatty acids are found in foods such as flaxseeds and coldwater fish, like sardines, salmon, and mackerel.  Limit how much you eat of the following: ? Canned or prepackaged foods. ? Food that is high in trans fat, such as fried foods. ? Food that is high in saturated fat, such as fatty meat. ? Sweets, desserts, sugary drinks, and other foods with added sugar. ? Full-fat dairy products.  Do not salt foods before eating.  Try to eat at least 2 vegetarian meals each week.  Eat more home-cooked food and less restaurant, buffet, and fast food.  When eating at a restaurant, ask that your food be prepared with less salt or no salt, if possible. What foods are recommended? The items listed may not be a complete list. Talk with your dietitian about   what dietary choices are best for you. Grains Whole-grain or whole-wheat bread. Whole-grain or whole-wheat pasta. Brown rice. Oatmeal. Quinoa. Bulgur. Whole-grain and low-sodium cereals. Pita bread. Low-fat, low-sodium crackers. Whole-wheat flour tortillas. Vegetables Fresh or frozen vegetables (raw, steamed, roasted, or grilled). Low-sodium or reduced-sodium tomato and vegetable juice. Low-sodium or reduced-sodium tomato sauce and tomato paste. Low-sodium or reduced-sodium canned vegetables. Fruits All fresh, dried, or frozen fruit. Canned fruit in natural juice (without  added sugar). Meat and other protein foods Skinless chicken or turkey. Ground chicken or turkey. Pork with fat trimmed off. Fish and seafood. Egg whites. Dried beans, peas, or lentils. Unsalted nuts, nut butters, and seeds. Unsalted canned beans. Lean cuts of beef with fat trimmed off. Low-sodium, lean deli meat. Dairy Low-fat (1%) or fat-free (skim) milk. Fat-free, low-fat, or reduced-fat cheeses. Nonfat, low-sodium ricotta or cottage cheese. Low-fat or nonfat yogurt. Low-fat, low-sodium cheese. Fats and oils Soft margarine without trans fats. Vegetable oil. Low-fat, reduced-fat, or light mayonnaise and salad dressings (reduced-sodium). Canola, safflower, olive, soybean, and sunflower oils. Avocado. Seasoning and other foods Herbs. Spices. Seasoning mixes without salt. Unsalted popcorn and pretzels. Fat-free sweets. What foods are not recommended? The items listed may not be a complete list. Talk with your dietitian about what dietary choices are best for you. Grains Baked goods made with fat, such as croissants, muffins, or some breads. Dry pasta or rice meal packs. Vegetables Creamed or fried vegetables. Vegetables in a cheese sauce. Regular canned vegetables (not low-sodium or reduced-sodium). Regular canned tomato sauce and paste (not low-sodium or reduced-sodium). Regular tomato and vegetable juice (not low-sodium or reduced-sodium). Pickles. Olives. Fruits Canned fruit in a light or heavy syrup. Fried fruit. Fruit in cream or butter sauce. Meat and other protein foods Fatty cuts of meat. Ribs. Fried meat. Bacon. Sausage. Bologna and other processed lunch meats. Salami. Fatback. Hotdogs. Bratwurst. Salted nuts and seeds. Canned beans with added salt. Canned or smoked fish. Whole eggs or egg yolks. Chicken or turkey with skin. Dairy Whole or 2% milk, cream, and half-and-half. Whole or full-fat cream cheese. Whole-fat or sweetened yogurt. Full-fat cheese. Nondairy creamers. Whipped toppings.  Processed cheese and cheese spreads. Fats and oils Butter. Stick margarine. Lard. Shortening. Ghee. Bacon fat. Tropical oils, such as coconut, palm kernel, or palm oil. Seasoning and other foods Salted popcorn and pretzels. Onion salt, garlic salt, seasoned salt, table salt, and sea salt. Worcestershire sauce. Tartar sauce. Barbecue sauce. Teriyaki sauce. Soy sauce, including reduced-sodium. Steak sauce. Canned and packaged gravies. Fish sauce. Oyster sauce. Cocktail sauce. Horseradish that you find on the shelf. Ketchup. Mustard. Meat flavorings and tenderizers. Bouillon cubes. Hot sauce and Tabasco sauce. Premade or packaged marinades. Premade or packaged taco seasonings. Relishes. Regular salad dressings. Where to find more information:  National Heart, Lung, and Blood Institute: www.nhlbi.nih.gov  American Heart Association: www.heart.org Summary  The DASH eating plan is a healthy eating plan that has been shown to reduce high blood pressure (hypertension). It may also reduce your risk for type 2 diabetes, heart disease, and stroke.  With the DASH eating plan, you should limit salt (sodium) intake to 2,300 mg a day. If you have hypertension, you may need to reduce your sodium intake to 1,500 mg a day.  When on the DASH eating plan, aim to eat more fresh fruits and vegetables, whole grains, lean proteins, low-fat dairy, and heart-healthy fats.  Work with your health care provider or diet and nutrition specialist (dietitian) to adjust your eating plan to your   individual calorie needs. This information is not intended to replace advice given to you by your health care provider. Make sure you discuss any questions you have with your health care provider. Document Revised: 10/18/2017 Document Reviewed: 10/29/2016 Elsevier Patient Education  2020 Elsevier Inc.  

## 2020-04-08 ENCOUNTER — Ambulatory Visit: Payer: Medicaid Other

## 2020-04-08 DIAGNOSIS — Z79899 Other long term (current) drug therapy: Secondary | ICD-10-CM

## 2020-04-08 NOTE — Progress Notes (Cosign Needed)
Medication Management Clinic Visit Note  Patient: Kelsey Terry MRN: [id number] Date of Birth: [birth year] PCP: Kelsey Reusing, NP   Kelsey Terry 59 y.o. female presents for a MTM Follow-up visit today. Pt was identified by name and DOB.  There were no vitals taken for this visit.  Patient Information   Past Medical History:  Diagnosis Date  . Allergies   . Asthma   . Blood dyscrasia    blood clot in right leg  . CAD (coronary artery disease) 05/05/2019  . CAD in native artery    a. NSTEMI 05/16/18: [place] 05/16/18 - ost-pLAD 95% s/p PCI/DES, mLAD 30%, ostD1 70%, ost-pLCx 30%, mRCA 90%, dRCA 20%; b. staged PCI/DES to [facility] 7/1  . Complication of anesthesia    patient stated she had "some kind of reaction to anesthesia, "thought she was having a stroke" during her second cath procedure.  . Diastolic dysfunction    a. TTE 05/16/18: EF 55-60%, no RWMA, Gr1DD, trivial AI, calcified mitral annulus, mild MR, mildly dilated LA  . Dyspnea    with activity  . Hypertension   . Lymphedema    LLE  . Pericarditis    a. ~ 2012 in [place], [place] s/p pericardiocentesis   . Wears contact lenses       Past Surgical History:  Procedure Laterality Date  . ABDOMINAL HYSTERECTOMY    . CESAREAN SECTION    . COLONOSCOPY WITH PROPOFOL N/A 08/13/2019   Procedure: COLONOSCOPY WITH BIOPSIES;  Surgeon: Kelsey Lame, MD;  Location: [facility];  Service: Endoscopy;  Laterality: N/A;  . CORONARY STENT INTERVENTION N/A 05/16/2018   Procedure: CORONARY STENT INTERVENTION;  Surgeon: Kelsey Hampshire, MD;  Location: [facility];  Service: Cardiovascular;  Laterality: N/A;  . CORONARY STENT INTERVENTION N/A 05/19/2018   Procedure: CORONARY STENT INTERVENTION;  Surgeon: Kelsey Hampshire, MD;  Location: [facility];  Service: Cardiovascular;  Laterality: N/A;  . LEFT HEART CATH AND CORONARY ANGIOGRAPHY N/A 05/16/2018   Procedure: LEFT HEART CATH AND CORONARY ANGIOGRAPHY;  Surgeon: Kelsey Hampshire, MD;  Location: [facility];  Service: Cardiovascular;  Laterality: N/A;  . PERICARDIOCENTESIS    . POLYPECTOMY N/A 08/13/2019   Procedure: POLYPECTOMY;  Surgeon: Kelsey Lame, MD;  Location: [facility];  Service: Endoscopy;  Laterality: N/A;     Family History  Problem Relation Age of Onset  . Hypertension Mother   . Pancreatic cancer Father   . Diabetes Brother   . Breast cancer Neg Hx    Outpatient Encounter Medications as of 04/08/2020  Medication Sig  . aspirin EC 81 MG tablet Take 1 tablet (81 mg total) by mouth daily.  Marland Kitchen CALCIUM CITRATE-VITAMIN D PO Take 1 tablet by mouth 2 (two) times daily.  . chlorthalidone (HYGROTON) 25 MG tablet Take 0.5 tablets (12.5 mg total) by mouth daily.  Marland Kitchen losartan (COZAAR) 100 MG tablet Patient no longer taking HCTZ (Patient taking differently: Take 100 mg by mouth daily. Patient no longer taking HCTZ)  . meloxicam (MOBIC) 15 MG tablet Take 1 tablet (15 mg total) by mouth daily.  . metFORMIN (GLUCOPHAGE) 500 MG tablet Take 500 mg by mouth daily.  . Multiple Vitamin (MULTIVITAMIN) tablet Take 1 tablet by mouth daily.  . potassium chloride (KLOR-CON) 10 MEQ tablet Take 2 tablets (20 mEq total) by mouth daily.  . rosuvastatin (CRESTOR) 40 MG tablet Take 1 tablet (40 mg total) by mouth daily.  . Blood Pressure KIT 1 kit  by Does not apply route daily.   No facility-administered encounter medications on file as of 04/08/2020.   Health Maintenance/Date Completed  Last Visit to PCP: April 2021 Next Visit to PCP: June 2021 Specialist Visit: March 2021 Dental Exam: Years ago Eye Exam: April 2021 Mammogram: June 2021 Colonoscopy: October 2020 Flu Vaccine: No Pneumonia Vaccine: no COVID-19 Vaccine: no Shingrix Vaccine: no  New Diagnoses (since last visit):   Family Support: Good  Lifestyle Walks 1-1.5 hrs a day every day Diet: Breakfast: Late breakfast cold cereal, breakfast bar, fruit Lunch: no Lunch Dinner:  Chicken caesar salad, fruits Drinks:Water, orange juice, coffee            Social History   Substance and Sexual Activity  Alcohol Use Not Currently      Social History   Tobacco Use  Smoking Status Never Smoker  Smokeless Tobacco Never Used      Health Maintenance  Topic Date Due  . Hepatitis C Screening  Never done  . COVID-19 Vaccine (1) Never done  . HIV Screening  Never done  . TETANUS/TDAP  Never done  . PAP SMEAR-Modifier  Never done  . INFLUENZA VACCINE  06/19/2020  . MAMMOGRAM  05/12/2021  . COLONOSCOPY  08/12/2024     Assessment and Plan:  Adherence/Compliance:  Pt great adherence, uses a pill box doesn't miss any dose. Content with current regimen. Requested if we can connect her to some resource for compression leg sleeves cause she can't afford them.  HTN: Chlorthalidone, Losartan currently. BP ~116/74 pt says she keeps track of her BP often and logs it. Pt says its sometimes higher when she measures before taking her meds because she doesn't take a meds before eating. Denied any sx of hypotension.  CAD/NSTEMI w/ stent/PAD (04/2018): Pt previous history of MI, pt seems stable on current regimen emphasized how happy she was to discontinue brilinta noted that her doctor was the one that discontinued it.     Hyperlipidemia:  Pt on rosuvastatin 40 mg. Lipid panel from 3/3/201 TC 144 HDL 50 TG 68 LDL 80. Reports no AE.   Pain/Left Leg Lymphedema: Pt says she wears support pantyhose, tries to elevate legs when sitting, and exercises daily. She has regular follow-ups with her doctor. She states that her meloxicam helps with the pain. She rates her pain 1/10 currently, and previously it was 7-8/10 Previous MTM showed that she was using compression stockings as well however she states she would like assistance if possible so that she could acquire some more.   Prediabetes:  Pt is now on metformin 521m daily A1c 5.9 (01/20/2020). Reports no AE from metformin. Pt is  doing well on her diet and has adequate water intake.   Estimated time: 30-40 mins  RTC follow-up in 6 months.   TWillow Ora(Kelsey) PErlangerPharmD Candidate [employer]

## 2020-04-11 ENCOUNTER — Other Ambulatory Visit: Payer: Self-pay

## 2020-05-11 ENCOUNTER — Other Ambulatory Visit: Payer: Medicaid Other

## 2020-05-16 ENCOUNTER — Other Ambulatory Visit: Payer: Self-pay

## 2020-05-16 MED ORDER — METFORMIN HCL 500 MG PO TABS: 500.0000 mg | ORAL_TABLET | Freq: Every day | ORAL | 0 refills | Status: DC

## 2020-05-17 ENCOUNTER — Other Ambulatory Visit: Payer: Self-pay

## 2020-05-17 ENCOUNTER — Ambulatory Visit: Payer: Medicaid Other | Admitting: Gerontology

## 2020-05-17 ENCOUNTER — Ambulatory Visit: Payer: Medicaid Other | Admitting: Specialist

## 2020-05-17 DIAGNOSIS — M25561 Pain in right knee: Secondary | ICD-10-CM

## 2020-05-17 MED ORDER — MELOXICAM 15 MG PO TABS: 15.0000 mg | ORAL_TABLET | Freq: Every day | ORAL | 2 refills | Status: AC

## 2020-05-17 NOTE — Progress Notes (Signed)
   Subjective:    Patient ID: Kelsey Terry, female    DOB: 1961-02-26, 59 y.o.   MRN: [id number]  HPI Pt feels much better with the Meloxican. I have refilled her medicine for the next month. She will taper the dosage. She will follow up with Benjamine Mola PRN.   Review of Systems     Objective:   Physical Exam        Assessment & Plan:

## 2020-05-25 ENCOUNTER — Other Ambulatory Visit: Payer: Self-pay

## 2020-05-25 ENCOUNTER — Other Ambulatory Visit: Payer: Medicaid Other

## 2020-05-25 DIAGNOSIS — Z0000 Encounter for general adult medical examination without abnormal findings: Secondary | ICD-10-CM

## 2020-05-26 LAB — HEMOGLOBIN A1C
Est. average glucose Bld gHb Est-mCnc: 126 mg/dL
Hgb A1c MFr Bld: 6 % — ABNORMAL HIGH (ref 4.8–5.6)

## 2020-05-26 LAB — BASIC METABOLIC PANEL
BUN/Creatinine Ratio: 18 (ref 12–28)
BUN: 19 mg/dL (ref 8–27)
CO2: 26 mmol/L (ref 20–29)
Calcium: 10.1 mg/dL (ref 8.7–10.3)
Chloride: 104 mmol/L (ref 96–106)
Creatinine, Ser: 1.03 mg/dL — ABNORMAL HIGH (ref 0.57–1.00)
GFR calc Af Amer: 68 mL/min/{1.73_m2} (ref >59)
GFR calc non Af Amer: 59 mL/min/{1.73_m2} — ABNORMAL LOW (ref >59)
Glucose: 95 mg/dL (ref 65–99)
Potassium: 3.5 mmol/L (ref 3.5–5.2)
Sodium: 142 mmol/L (ref 134–144)

## 2020-05-27 ENCOUNTER — Telehealth: Payer: Self-pay | Admitting: *Deleted

## 2020-06-02 ENCOUNTER — Ambulatory Visit: Payer: Medicaid Other | Admitting: Gerontology

## 2020-06-02 ENCOUNTER — Encounter: Payer: Self-pay | Admitting: Gerontology

## 2020-06-02 VITALS — BP 131/75 | HR 62 | Ht 63.0 in | Wt 195.0 lb

## 2020-06-02 DIAGNOSIS — I10 Essential (primary) hypertension: Secondary | ICD-10-CM

## 2020-06-02 DIAGNOSIS — R7303 Prediabetes: Secondary | ICD-10-CM

## 2020-06-02 MED ORDER — CHLORTHALIDONE 25 MG PO TABS: 12.5000 mg | ORAL_TABLET | Freq: Every day | ORAL | 1 refills | Status: DC

## 2020-06-02 MED ORDER — LOSARTAN POTASSIUM 100 MG PO TABS: 100.0000 mg | ORAL_TABLET | Freq: Every day | ORAL | 0 refills | Status: DC

## 2020-06-02 NOTE — Patient Instructions (Addendum)
DASH Eating Plan DASH stands for "Dietary Approaches to Stop Hypertension." The DASH eating plan is a healthy eating plan that has been shown to reduce high blood pressure (hypertension). It may also reduce your risk for type 2 diabetes, heart disease, and stroke. The DASH eating plan may also help with weight loss. What are tips for following this plan?  General guidelines  Avoid eating more than 2,300 mg (milligrams) of salt (sodium) a day. If you have hypertension, you may need to reduce your sodium intake to 1,500 mg a day.  Limit alcohol intake to no more than 1 drink a day for nonpregnant women and 2 drinks a day for men. One drink equals 12 oz of beer, 5 oz of wine, or 1 oz of hard liquor.  Work with your health care provider to maintain a healthy body weight or to lose weight. Ask what an ideal weight is for you.  Get at least 30 minutes of exercise that causes your heart to beat faster (aerobic exercise) most days of the week. Activities may include walking, swimming, or biking.  Work with your health care provider or diet and nutrition specialist (dietitian) to adjust your eating plan to your individual calorie needs. Reading food labels   Check food labels for the amount of sodium per serving. Choose foods with less than 5 percent of the Daily Value of sodium. Generally, foods with less than 300 mg of sodium per serving fit into this eating plan.  To find whole grains, look for the word "whole" as the first word in the ingredient list. Shopping  Buy products labeled as "low-sodium" or "no salt added."  Buy fresh foods. Avoid canned foods and premade or frozen meals. Cooking  Avoid adding salt when cooking. Use salt-free seasonings or herbs instead of table salt or sea salt. Check with your health care provider or pharmacist before using salt substitutes.  Do not fry foods. Cook foods using healthy methods such as baking, boiling, grilling, and broiling instead.  Cook with  heart-healthy oils, such as olive, canola, soybean, or sunflower oil. Meal planning  Eat a balanced diet that includes: ? 5 or more servings of fruits and vegetables each day. At each meal, try to fill half of your plate with fruits and vegetables. ? Up to 6-8 servings of whole grains each day. ? Less than 6 oz of lean meat, poultry, or fish each day. A 3-oz serving of meat is about the same size as a deck of cards. One egg equals 1 oz. ? 2 servings of low-fat dairy each day. ? A serving of nuts, seeds, or beans 5 times each week. ? Heart-healthy fats. Healthy fats called Omega-3 fatty acids are found in foods such as flaxseeds and coldwater fish, like sardines, salmon, and mackerel.  Limit how much you eat of the following: ? Canned or prepackaged foods. ? Food that is high in trans fat, such as fried foods. ? Food that is high in saturated fat, such as fatty meat. ? Sweets, desserts, sugary drinks, and other foods with added sugar. ? Full-fat dairy products.  Do not salt foods before eating.  Try to eat at least 2 vegetarian meals each week.  Eat more home-cooked food and less restaurant, buffet, and fast food.  When eating at a restaurant, ask that your food be prepared with less salt or no salt, if possible. What foods are recommended? The items listed may not be a complete list. Talk with your dietitian about   what dietary choices are best for you. Grains Whole-grain or whole-wheat bread. Whole-grain or whole-wheat pasta. Brown rice. Oatmeal. Quinoa. Bulgur. Whole-grain and low-sodium cereals. Pita bread. Low-fat, low-sodium crackers. Whole-wheat flour tortillas. Vegetables Fresh or frozen vegetables (raw, steamed, roasted, or grilled). Low-sodium or reduced-sodium tomato and vegetable juice. Low-sodium or reduced-sodium tomato sauce and tomato paste. Low-sodium or reduced-sodium canned vegetables. Fruits All fresh, dried, or frozen fruit. Canned fruit in natural juice (without  added sugar). Meat and other protein foods Skinless chicken or turkey. Ground chicken or turkey. Pork with fat trimmed off. Fish and seafood. Egg whites. Dried beans, peas, or lentils. Unsalted nuts, nut butters, and seeds. Unsalted canned beans. Lean cuts of beef with fat trimmed off. Low-sodium, lean deli meat. Dairy Low-fat (1%) or fat-free (skim) milk. Fat-free, low-fat, or reduced-fat cheeses. Nonfat, low-sodium ricotta or cottage cheese. Low-fat or nonfat yogurt. Low-fat, low-sodium cheese. Fats and oils Soft margarine without trans fats. Vegetable oil. Low-fat, reduced-fat, or light mayonnaise and salad dressings (reduced-sodium). Canola, safflower, olive, soybean, and sunflower oils. Avocado. Seasoning and other foods Herbs. Spices. Seasoning mixes without salt. Unsalted popcorn and pretzels. Fat-free sweets. What foods are not recommended? The items listed may not be a complete list. Talk with your dietitian about what dietary choices are best for you. Grains Baked goods made with fat, such as croissants, muffins, or some breads. Dry pasta or rice meal packs. Vegetables Creamed or fried vegetables. Vegetables in a cheese sauce. Regular canned vegetables (not low-sodium or reduced-sodium). Regular canned tomato sauce and paste (not low-sodium or reduced-sodium). Regular tomato and vegetable juice (not low-sodium or reduced-sodium). Pickles. Olives. Fruits Canned fruit in a light or heavy syrup. Fried fruit. Fruit in cream or butter sauce. Meat and other protein foods Fatty cuts of meat. Ribs. Fried meat. Bacon. Sausage. Bologna and other processed lunch meats. Salami. Fatback. Hotdogs. Bratwurst. Salted nuts and seeds. Canned beans with added salt. Canned or smoked fish. Whole eggs or egg yolks. Chicken or turkey with skin. Dairy Whole or 2% milk, cream, and half-and-half. Whole or full-fat cream cheese. Whole-fat or sweetened yogurt. Full-fat cheese. Nondairy creamers. Whipped toppings.  Processed cheese and cheese spreads. Fats and oils Butter. Stick margarine. Lard. Shortening. Ghee. Bacon fat. Tropical oils, such as coconut, palm kernel, or palm oil. Seasoning and other foods Salted popcorn and pretzels. Onion salt, garlic salt, seasoned salt, table salt, and sea salt. Worcestershire sauce. Tartar sauce. Barbecue sauce. Teriyaki sauce. Soy sauce, including reduced-sodium. Steak sauce. Canned and packaged gravies. Fish sauce. Oyster sauce. Cocktail sauce. Horseradish that you find on the shelf. Ketchup. Mustard. Meat flavorings and tenderizers. Bouillon cubes. Hot sauce and Tabasco sauce. Premade or packaged marinades. Premade or packaged taco seasonings. Relishes. Regular salad dressings. Where to find more information:  National Heart, Lung, and Blood Institute: www.nhlbi.nih.gov  American Heart Association: www.heart.org Summary  The DASH eating plan is a healthy eating plan that has been shown to reduce high blood pressure (hypertension). It may also reduce your risk for type 2 diabetes, heart disease, and stroke.  With the DASH eating plan, you should limit salt (sodium) intake to 2,300 mg a day. If you have hypertension, you may need to reduce your sodium intake to 1,500 mg a day.  When on the DASH eating plan, aim to eat more fresh fruits and vegetables, whole grains, lean proteins, low-fat dairy, and heart-healthy fats.  Work with your health care provider or diet and nutrition specialist (dietitian) to adjust your eating plan to your   individual calorie needs. This information is not intended to replace advice given to you by your health care provider. Make sure you discuss any questions you have with your health care provider. Document Revised: 10/18/2017 Document Reviewed: 10/29/2016 Elsevier Patient Education  2020 Elsevier Inc.  Prediabetes Eating Plan Prediabetes is a condition that causes blood sugar (glucose) levels to be higher than normal. This increases  the risk for developing diabetes. In order to prevent diabetes from developing, your health care provider may recommend a diet and other lifestyle changes to help you:  Control your blood glucose levels.  Improve your cholesterol levels.  Manage your blood pressure. Your health care provider may recommend working with a diet and nutrition specialist (dietitian) to make a meal plan that is best for you. What are tips for following this plan? Lifestyle  Set weight loss goals with the help of your health care team. It is recommended that most people with prediabetes lose 7% of their current body weight.  Exercise for at least 30 minutes at least 5 days a week.  Attend a support group or seek ongoing support from a mental health counselor.  Take over-the-counter and prescription medicines only as told by your health care provider. Reading food labels  Read food labels to check the amount of fat, salt (sodium), and sugar in prepackaged foods. Avoid foods that have: ? Saturated fats. ? Trans fats. ? Added sugars.  Avoid foods that have more than 300 milligrams (mg) of sodium per serving. Limit your daily sodium intake to less than 2,300 mg each day. Shopping  Avoid buying pre-made and processed foods. Cooking  Cook with olive oil. Do not use butter, lard, or ghee.  Bake, broil, grill, or boil foods. Avoid frying. Meal planning   Work with your dietitian to develop an eating plan that is right for you. This may include: ? Tracking how many calories you take in. Use a food diary, notebook, or mobile application to track what you eat at each meal. ? Using the glycemic index (GI) to plan your meals. The index tells you how quickly a food will raise your blood glucose. Choose low-GI foods. These foods take a longer time to raise blood glucose.  Consider following a Mediterranean diet. This diet includes: ? Several servings each day of fresh fruits and vegetables. ? Eating fish at  least twice a week. ? Several servings each day of whole grains, beans, nuts, and seeds. ? Using olive oil instead of other fats. ? Moderate alcohol consumption. ? Eating small amounts of red meat and whole-fat dairy.  If you have high blood pressure, you may need to limit your sodium intake or follow a diet such as the DASH eating plan. DASH is an eating plan that aims to lower high blood pressure. What foods are recommended? The items listed below may not be a complete list. Talk with your dietitian about what dietary choices are best for you. Grains Whole grains, such as whole-wheat or whole-grain breads, crackers, cereals, and pasta. Unsweetened oatmeal. Bulgur. Barley. Quinoa. Brown rice. Corn or whole-wheat flour tortillas or taco shells. Vegetables Lettuce. Spinach. Peas. Beets. Cauliflower. Cabbage. Broccoli. Carrots. Tomatoes. Squash. Eggplant. Herbs. Peppers. Onions. Cucumbers. Brussels sprouts. Fruits Berries. Bananas. Apples. Oranges. Grapes. Papaya. Mango. Pomegranate. Kiwi. Grapefruit. Cherries. Meats and other protein foods Seafood. Poultry without skin. Lean cuts of pork and beef. Tofu. Eggs. Nuts. Beans. Dairy Low-fat or fat-free dairy products, such as yogurt, cottage cheese, and cheese. Beverages Water. Tea. Coffee.   Sugar-free or diet soda. Seltzer water. Lowfat or no-fat milk. Milk alternatives, such as soy or almond milk. Fats and oils Olive oil. Canola oil. Sunflower oil. Grapeseed oil. Avocado. Walnuts. Sweets and desserts Sugar-free or low-fat pudding. Sugar-free or low-fat ice cream and other frozen treats. Seasoning and other foods Herbs. Sodium-free spices. Mustard. Relish. Low-fat, low-sugar ketchup. Low-fat, low-sugar barbecue sauce. Low-fat or fat-free mayonnaise. What foods are not recommended? The items listed below may not be a complete list. Talk with your dietitian about what dietary choices are best for you. Grains Refined white flour and flour  products, such as bread, pasta, snack foods, and cereals. Vegetables Canned vegetables. Frozen vegetables with butter or cream sauce. Fruits Fruits canned with syrup. Meats and other protein foods Fatty cuts of meat. Poultry with skin. Breaded or fried meat. Processed meats. Dairy Full-fat yogurt, cheese, or milk. Beverages Sweetened drinks, such as sweet iced tea and soda. Fats and oils Butter. Lard. Ghee. Sweets and desserts Baked goods, such as cake, cupcakes, pastries, cookies, and cheesecake. Seasoning and other foods Spice mixes with added salt. Ketchup. Barbecue sauce. Mayonnaise. Summary  To prevent diabetes from developing, you may need to make diet and other lifestyle changes to help control blood sugar, improve cholesterol levels, and manage your blood pressure.  Set weight loss goals with the help of your health care team. It is recommended that most people with prediabetes lose 7 percent of their current body weight.  Consider following a Mediterranean diet that includes plenty of fresh fruits and vegetables, whole grains, beans, nuts, seeds, fish, lean meat, low-fat dairy, and healthy oils. This information is not intended to replace advice given to you by your health care provider. Make sure you discuss any questions you have with your health care provider. Document Revised: 02/27/2019 Document Reviewed: 01/09/2017 Elsevier Patient Education  2020 Elsevier Inc.  

## 2020-06-02 NOTE — Progress Notes (Signed)
Established Patient Office Visit  Subjective:  Patient ID: Kelsey Terry, female    DOB: [birth year]  Age: 59 y.o. MRN: [id number]  CC:  Chief Complaint  Patient presents with  . Hypertension    HPI Kelsey Terry presents for follow up of hypertension, prediabetes and lab review. She states that she's compliant with her medications and adheres to DASH diet and exercises as tolerated. She checks her blood pressure once a week and it is usually less than 140/90. Her Labs done on 05/25/2020, serum creatinine decreased from 1.11 mg/dl to 1.03 mg/dl. Her HgbA1c increased from 5.9% to 6%. She states that she has increased her water intake and continues on 500 mg Metformin daily, adheres to ADA diet and exercises as tolerated. She also states that taking 15 mg Meloxicam as needed relieves her right knee pain. She reports that she has healthcare insurance and she's looking for a Provider. Overall, she states that she's doing well and offers no further complaint.  Past Medical History:  Diagnosis Date  . Allergies   . Asthma   . Blood dyscrasia    blood clot in right leg  . CAD (coronary artery disease) 05/05/2019  . CAD in native artery    a. NSTEMI 05/16/18: [facility] 05/16/18 - ost-pLAD 95% s/p PCI/DES, mLAD 30%, ostD1 70%, ost-pLCx 30%, mRCA 90%, dRCA 20%; b. staged PCI/DES to [facility] 7/1  . Complication of anesthesia    patient stated she had "some kind of reaction to anesthesia, "thought she was having a stroke" during her second cath procedure.  . Diastolic dysfunction    a. TTE 05/16/18: EF 55-60%, no RWMA, Gr1DD, trivial AI, calcified mitral annulus, mild MR, mildly dilated LA  . Dyspnea    with activity  . Hypertension   . Lymphedema    LLE  . Pericarditis    a. ~ 2012 in [place], [place] s/p pericardiocentesis   . Wears contact lenses     Past Surgical History:  Procedure Laterality Date  . ABDOMINAL HYSTERECTOMY    . CESAREAN SECTION    . COLONOSCOPY WITH PROPOFOL N/A 08/13/2019   Procedure:  COLONOSCOPY WITH BIOPSIES;  Surgeon: Lucilla Lame, MD;  Location: [facility];  Service: Endoscopy;  Laterality: N/A;  . CORONARY STENT INTERVENTION N/A 05/16/2018   Procedure: CORONARY STENT INTERVENTION;  Surgeon: Wellington Hampshire, MD;  Location: [place] [facility];  Service: Cardiovascular;  Laterality: N/A;  . CORONARY STENT INTERVENTION N/A 05/19/2018   Procedure: CORONARY STENT INTERVENTION;  Surgeon: Wellington Hampshire, MD;  Location: [place] [facility];  Service: Cardiovascular;  Laterality: N/A;  . LEFT HEART CATH AND CORONARY ANGIOGRAPHY N/A 05/16/2018   Procedure: LEFT HEART CATH AND CORONARY ANGIOGRAPHY;  Surgeon: Wellington Hampshire, MD;  Location: [place] [facility];  Service: Cardiovascular;  Laterality: N/A;  . PERICARDIOCENTESIS    . POLYPECTOMY N/A 08/13/2019   Procedure: POLYPECTOMY;  Surgeon: Lucilla Lame, MD;  Location: [facility];  Service: Endoscopy;  Laterality: N/A;    Family History  Problem Relation Age of Onset  . Hypertension Mother   . Pancreatic cancer Father   . Diabetes Brother   . Breast cancer Neg Hx     Social History   Socioeconomic History  . Marital status: Widowed    Spouse name: Not on file  . Number of children: Not on file  . Years of education: Not on file  . Highest education level: Not on file  Occupational History  . Not on  file  Tobacco Use  . Smoking status: Never Smoker  . Smokeless tobacco: Never Used  Vaping Use  . Vaping Use: Never used  Substance and Sexual Activity  . Alcohol use: Not Currently  . Drug use: Never  . Sexual activity: Not on file  Other Topics Concern  . Not on file  Social History Narrative  . Not on file   Social Determinants of Health   Financial Resource Strain: High Risk  . Difficulty of Paying Living Expenses: Very hard  Food Insecurity: Food Insecurity Present  . Worried About Charity fundraiser in the Last Year: Often true  . Ran Out of Food in the Last Year: Often true   Transportation Needs: No Transportation Needs  . Lack of Transportation (Medical): No  . Lack of Transportation (Non-Medical): No  Physical Activity: Unknown  . Days of Exercise per Week: 3 days  . Minutes of Exercise per Session: Not on file  Stress: No Stress Concern Present  . Feeling of Stress : Only a little  Social Connections: Moderately Integrated  . Frequency of Communication with Friends and Family: More than three times a week  . Frequency of Social Gatherings with Friends and Family: More than three times a week  . Attends Religious Services: More than 4 times per year  . Active Member of Clubs or Organizations: Yes  . Attends Archivist Meetings: Never  . Marital Status: Widowed  Intimate Partner Violence: Not At Risk  . Fear of Current or Ex-Partner: No  . Emotionally Abused: No  . Physically Abused: No  . Sexually Abused: No    Outpatient Medications Prior to Visit  Medication Sig Dispense Refill  . aspirin EC 81 MG tablet Take 1 tablet (81 mg total) by mouth daily. 90 tablet 1  . CALCIUM CITRATE-VITAMIN D PO Take 1 tablet by mouth 2 (two) times daily.    . meloxicam (MOBIC) 15 MG tablet Take 1 tablet (15 mg total) by mouth daily. 30 tablet 2  . metFORMIN (GLUCOPHAGE) 500 MG tablet Take 1 tablet (500 mg total) by mouth daily. 90 tablet 0  . Multiple Vitamin (MULTIVITAMIN) tablet Take 1 tablet by mouth daily.    . potassium chloride (KLOR-CON) 10 MEQ tablet Take 2 tablets (20 mEq total) by mouth daily. 180 tablet 0  . rosuvastatin (CRESTOR) 40 MG tablet Take 1 tablet (40 mg total) by mouth daily. 90 tablet 1  . chlorthalidone (HYGROTON) 25 MG tablet Take 0.5 tablets (12.5 mg total) by mouth daily. 45 tablet 1  . losartan (COZAAR) 100 MG tablet Patient no longer taking HCTZ (Patient taking differently: Take 100 mg by mouth daily. Patient no longer taking HCTZ) 90 tablet 1  . Blood Pressure KIT 1 kit by Does not apply route daily. 1 kit 0   No  facility-administered medications prior to visit.    Allergies  Allergen Reactions  . Amoxicillin Itching and Other (See Comments)    Reaction: bump in vaginal region Has patient had a PCN reaction causing immediate rash, facial/tongue/throat swelling, SOB or lightheadedness with hypotension: No Has patient had a PCN reaction causing severe rash involving mucus membranes or skin necrosis: No Has patient had a PCN reaction that required hospitalization: No Has patient had a PCN reaction occurring within the last 10 years: No If all of the above answers are "NO", then may proceed with Cephalosporin use.   . Erythromycin     Hives, rash    ROS Review of  Systems  Constitutional: Negative.   Eyes: Negative.   Respiratory: Negative.   Cardiovascular: Negative.   Genitourinary: Negative.   Neurological: Negative.   Psychiatric/Behavioral: Negative.       Objective:    Physical Exam HENT:     Head: Normocephalic and atraumatic.  Eyes:     Extraocular Movements: Extraocular movements intact.     Pupils: Pupils are equal, round, and reactive to light.  Cardiovascular:     Rate and Rhythm: Normal rate and regular rhythm.     Pulses: Normal pulses.     Heart sounds: Normal heart sounds.  Pulmonary:     Effort: Pulmonary effort is normal.     Breath sounds: Normal breath sounds.  Neurological:     General: No focal deficit present.     Mental Status: She is alert and oriented to person, place, and time. Mental status is at baseline.  Psychiatric:        Mood and Affect: Mood normal.        Behavior: Behavior normal.        Thought Content: Thought content normal.        Judgment: Judgment normal.     BP 131/75 (BP Location: Right Arm, Patient Position: Sitting)   Pulse 62   Ht 5' 3" (1.6 m)   Wt 195 lb (88.5 kg)   SpO2 98%   BMI 34.54 kg/m  Wt Readings from Last 3 Encounters:  06/02/20 195 lb (88.5 kg)  03/16/20 201 lb 9.6 oz (91.4 kg)  03/03/20 198 lb (89.8 kg)    She lost 6 pounds in 3 months and was encouraged to continue on her weight loss regimen.  Health Maintenance Due  Topic Date Due  . Hepatitis C Screening  Never done  . HIV Screening  Never done  . TETANUS/TDAP  Never done  . PAP SMEAR-Modifier  Never done    There are no preventive care reminders to display for this patient.  Lab Results  Component Value Date   TSH 1.350 11/20/2018   Lab Results  Component Value Date   WBC 5.1 11/20/2018   HGB 11.9 11/20/2018   HCT 37.0 11/20/2018   MCV 83 11/20/2018   PLT 244 11/20/2018   Lab Results  Component Value Date   NA 142 05/25/2020   K 3.5 05/25/2020   CO2 26 05/25/2020   GLUCOSE 95 05/25/2020   BUN 19 05/25/2020   CREATININE 1.03 (H) 05/25/2020   BILITOT 0.6 01/20/2020   ALKPHOS 71 01/20/2020   AST 21 01/20/2020   ALT 16 01/20/2020   PROT 7.8 01/20/2020   ALBUMIN 4.3 01/20/2020   CALCIUM 10.1 05/25/2020   ANIONGAP 7 06/30/2018   Lab Results  Component Value Date   CHOL 144 01/20/2020   Lab Results  Component Value Date   HDL 50 01/20/2020   Lab Results  Component Value Date   LDLCALC 80 01/20/2020   Lab Results  Component Value Date   TRIG 68 01/20/2020   Lab Results  Component Value Date   CHOLHDL 2.9 01/20/2020   Lab Results  Component Value Date   HGBA1C 6.0 (H) 05/25/2020      Assessment & Plan:   1. Essential hypertension - Her blood pressure is under control and she will continue on current treatment regimen. She was advised to continue on DASH diet and exercise as tolerated. Her Serum creatinine is improving and she was advised to increase water intake. - chlorthalidone (HYGROTON) 25 MG tablet;  Take 0.5 tablets (12.5 mg total) by mouth daily.  Dispense: 45 tablet; Refill: 1 - losartan (COZAAR) 100 MG tablet; Take 1 tablet (100 mg total) by mouth daily. Patient no longer taking HCTZ  Dispense: 90 tablet; Refill: 0  2. Prediabetes - Her HgbA1c was 6%, she was advised to continue on  current treatment regimen, low carb/non concentrated sweet diet and exercise as tolerated.     Follow-up: Return in about 4 weeks (around 06/30/2020), or if symptoms worsen or fail to improve.    Kelsey Jerold Coombe, NP

## 2020-06-07 ENCOUNTER — Other Ambulatory Visit: Payer: Self-pay

## 2020-06-07 ENCOUNTER — Ambulatory Visit
Admission: RE | Admit: 2020-06-07 | Discharge: 2020-06-07 | Disposition: A | Discharge status: Home / Self Care | Payer: Medicaid Other | Source: Ambulatory Visit | Attending: Oncology | Admitting: Oncology

## 2020-06-07 ENCOUNTER — Ambulatory Visit: Payer: Medicaid Other | Attending: Oncology

## 2020-06-07 VITALS — BP 140/71 | HR 66 | Ht 63.0 in | Wt 193.6 lb

## 2020-06-07 DIAGNOSIS — Z0000 Encounter for general adult medical examination without abnormal findings: Secondary | ICD-10-CM

## 2020-06-07 NOTE — Progress Notes (Signed)
  Subjective:     Patient ID: Kelsey Terry, female   DOB: Apr 03, 1961, 59 y.o.   MRN: [id number]  HPI   Review of Systems     Objective:   Physical Exam Chest:     Breasts:        Right: No swelling, bleeding, inverted nipple, mass, nipple discharge, skin change or tenderness.        Left: No swelling, bleeding, inverted nipple, mass, nipple discharge, skin change or tenderness.        Assessment:     59 year old patient presents for [facility] clinic visit.  Patient screened, and meets BCCCP eligibility.  Patient does not have insurance, Medicare or Medicaid. Instructed patient on breast self awareness using teach back method. Denies any breast problems.   Clinical breast exam unremarkable. No mass or lump palpated.    Risk Assessment    Risk Scores      06/07/2020 05/13/2019   Last edited by: Theodore Demark, RN Rico Junker, RN   5-year risk: 1.4 % 1.4 %   Lifetime risk: 6.9 % 7.1 %          Plan:     Sent for bilateral screening mammogram.

## 2020-06-08 ENCOUNTER — Ambulatory Visit: Payer: Medicaid Other

## 2020-06-08 NOTE — Progress Notes (Signed)
Letter mailed from [facility] to notify of normal mammogram results.  Patient to return in one year for annual screening.  Copy to [facility]. 

## 2020-06-27 ENCOUNTER — Emergency Department: Payer: Self-pay

## 2020-06-27 ENCOUNTER — Encounter: Payer: Self-pay | Admitting: Emergency Medicine

## 2020-06-27 ENCOUNTER — Ambulatory Visit
Admission: EM | Admit: 2020-06-27 | Discharge: 2020-06-27 | Disposition: A | Discharge status: Home / Self Care | Payer: Medicaid Other | Attending: Emergency Medicine | Admitting: Emergency Medicine

## 2020-06-27 ENCOUNTER — Other Ambulatory Visit: Payer: Self-pay

## 2020-06-27 DIAGNOSIS — R519 Headache, unspecified: Secondary | ICD-10-CM

## 2020-06-27 DIAGNOSIS — Z5321 Procedure and treatment not carried out due to patient leaving prior to being seen by health care provider: Secondary | ICD-10-CM | POA: Insufficient documentation

## 2020-06-27 DIAGNOSIS — Y939 Activity, unspecified: Secondary | ICD-10-CM | POA: Insufficient documentation

## 2020-06-27 DIAGNOSIS — Y929 Unspecified place or not applicable: Secondary | ICD-10-CM | POA: Insufficient documentation

## 2020-06-27 DIAGNOSIS — R55 Syncope and collapse: Secondary | ICD-10-CM

## 2020-06-27 DIAGNOSIS — S0990XA Unspecified injury of head, initial encounter: Secondary | ICD-10-CM | POA: Insufficient documentation

## 2020-06-27 DIAGNOSIS — X58XXXA Exposure to other specified factors, initial encounter: Secondary | ICD-10-CM | POA: Insufficient documentation

## 2020-06-27 DIAGNOSIS — R079 Chest pain, unspecified: Secondary | ICD-10-CM | POA: Insufficient documentation

## 2020-06-27 DIAGNOSIS — Y999 Unspecified external cause status: Secondary | ICD-10-CM | POA: Insufficient documentation

## 2020-06-27 HISTORY — DX: Type 2 diabetes mellitus without complications: E11.9

## 2020-06-27 LAB — CBC
HCT: 44.5 % (ref 36.0–46.0)
Hemoglobin: 14 g/dL (ref 12.0–15.0)
MCH: 26.6 pg (ref 26.0–34.0)
MCHC: 31.5 g/dL (ref 30.0–36.0)
MCV: 84.6 fL (ref 80.0–100.0)
Platelets: 210 10*3/uL (ref 150–400)
RBC: 5.26 MIL/uL — ABNORMAL HIGH (ref 3.87–5.11)
RDW: 14.6 % (ref 11.5–15.5)
WBC: 5.3 10*3/uL (ref 4.0–10.5)
nRBC: 0 % (ref 0.0–0.2)

## 2020-06-27 LAB — BASIC METABOLIC PANEL
Anion gap: 12 (ref 5–15)
BUN: 13 mg/dL (ref 6–20)
CO2: 26 mmol/L (ref 22–32)
Calcium: 10.2 mg/dL (ref 8.9–10.3)
Chloride: 104 mmol/L (ref 98–111)
Creatinine, Ser: 1.02 mg/dL — ABNORMAL HIGH (ref 0.44–1.00)
GFR calc Af Amer: >60 mL/min (ref >60)
GFR calc non Af Amer: 60 mL/min — ABNORMAL LOW (ref >60)
Glucose, Bld: 77 mg/dL (ref 70–99)
Potassium: 3.5 mmol/L (ref 3.5–5.1)
Sodium: 142 mmol/L (ref 135–145)

## 2020-06-27 LAB — TROPONIN I (HIGH SENSITIVITY): Troponin I (High Sensitivity): 8 ng/L (ref <18)

## 2020-06-27 NOTE — Discharge Instructions (Addendum)
Your EKG was normal here.  However, he should not have chest pain before passing out.  You could have had an arrhythmia or another NSTEMI causing her syncope.  I recommend going to an emergency department immediately for further work-up.  I feel that you are stable enough that your daughter can take you.  I suspect you may have a mild concussion as well.

## 2020-06-27 NOTE — ED Triage Notes (Signed)
Patient in today c/o headache x 2 days after falling in her bathroom on Saturday (06/25/20) and hitting her head on the bathroom door. Patient states she was at her sink drying off and suddenly felt dizzy and then she states she losed consciousness for ~ 5 minutes.

## 2020-06-27 NOTE — ED Triage Notes (Signed)
Pt in via POV, reports syncopal episode Saturday night, experiencing chest pain and dizziness prior to episode.  Does report hitting head with fall, denies taking blood thinners.  Ambulatory to triage, NAD noted at this time.

## 2020-06-27 NOTE — ED Provider Notes (Signed)
HPI  SUBJECTIVE:  Kelsey Terry is a 59 y.o. female who presents with a right-sided headache after having a syncopal episode 2 days ago.  Patient states that she got of a warm bathtub, felt dizzy and lightheaded, had nausea and substernal chest pain described as soreness followed by a syncopal episode.  The pain did not radiate up her neck, down her arm, through to her back.  Patient states that she hit her head.  This was an unwitnessed event.  She estimates that she was out for about 5 minutes.  She denies shortness of breath, palpitations, diaphoresis, abdominal, back pain, tinnitus, tunnel vision preceding the syncopal episode.  She has no retrograde or anterograde amnesia.  She describes her headache as sore, throbbing, located above her right ear.  No nausea, vomiting, visual changes, slurred speech, arm or leg weakness, facial droop, difficulty finding words.  She reports some cognitive slowing.  Denies injury to her neck, back, chest, abdomen.  She states that she is sore everywhere".  He states that she was feeling better yesterday, but got more sore today.  She also reports mild right eye pressure starting today.  She denies photophobia.  No further episodes of chest pain.  She has not tried anything for this.  No aggravating or alleviating factors.  She has a past medical history of NSTEMI and is status post stenting.  She has a history of coronary artery disease, is on 81 mg aspirin daily.  She has a history of right lower extremity DVT, lymphedema, prediabetes, hypertension.  She states that she had an episode of syncope after being discharged from the hospital for her NSTEMI but it was never evaluated.  No history of atrial fibrillation, ventricular tachycardia, other arrhythmia.  No recent change in medications.  No history of PE, cancer, hypercholesterolemia, smoking.  PMD: Langston Reusing, NP   Past Medical History:  Diagnosis Date  . Allergies   . Asthma   . Blood dyscrasia    blood  clot in right leg  . CAD (coronary artery disease) 05/05/2019  . CAD in native artery    a. NSTEMI 05/16/18: [place] 05/16/18 - ost-pLAD 95% s/p PCI/DES, mLAD 30%, ostD1 70%, ost-pLCx 30%, mRCA 90%, dRCA 20%; b. staged PCI/DES to [facility] 7/1  . Complication of anesthesia    patient stated she had "some kind of reaction to anesthesia, "thought she was having a stroke" during her second cath procedure.  . Diabetes mellitus without complication (HCC)    pre-diabetes  . Diastolic dysfunction    a. TTE 05/16/18: EF 55-60%, no RWMA, Gr1DD, trivial AI, calcified mitral annulus, mild MR, mildly dilated LA  . Dyspnea    with activity  . Hypertension   . Lymphedema    LLE  . Pericarditis    a. ~ 2012 in [place], [place] s/p pericardiocentesis   . Wears contact lenses     Past Surgical History:  Procedure Laterality Date  . ABDOMINAL HYSTERECTOMY    . CESAREAN SECTION    . COLONOSCOPY WITH PROPOFOL N/A 08/13/2019   Procedure: COLONOSCOPY WITH BIOPSIES;  Surgeon: Lucilla Lame, MD;  Location: [place];  Service: Endoscopy;  Laterality: N/A;  . CORONARY STENT INTERVENTION N/A 05/16/2018   Procedure: CORONARY STENT INTERVENTION;  Surgeon: Wellington Hampshire, MD;  Location: [place] [facility];  Service: Cardiovascular;  Laterality: N/A;  . CORONARY STENT INTERVENTION N/A 05/19/2018   Procedure: CORONARY STENT INTERVENTION;  Surgeon: Wellington Hampshire, MD;  Location: [place] [facility]  [facility];  Service: Cardiovascular;  Laterality: N/A;  . LEFT HEART CATH AND CORONARY ANGIOGRAPHY N/A 05/16/2018   Procedure: LEFT HEART CATH AND CORONARY ANGIOGRAPHY;  Surgeon: Wellington Hampshire, MD;  Location: [place] [facility];  Service: Cardiovascular;  Laterality: N/A;  . PERICARDIOCENTESIS    . POLYPECTOMY N/A 08/13/2019   Procedure: POLYPECTOMY;  Surgeon: Lucilla Lame, MD;  Location: [place];  Service: Endoscopy;  Laterality: N/A;    Family History  Problem Relation Age of Onset  . Hypertension Mother    . Pancreatic cancer Father   . Diabetes Brother   . Breast cancer Neg Hx     Social History   Tobacco Use  . Smoking status: Never Smoker  . Smokeless tobacco: Never Used  Vaping Use  . Vaping Use: Never used  Substance Use Topics  . Alcohol use: Never  . Drug use: Never    No current facility-administered medications for this encounter.  Current Outpatient Medications:  .  aspirin EC 81 MG tablet, Take 1 tablet (81 mg total) by mouth daily., Disp: 90 tablet, Rfl: 1 .  CALCIUM CITRATE-VITAMIN D PO, Take 1 tablet by mouth 2 (two) times daily., Disp: , Rfl:  .  chlorthalidone (HYGROTON) 25 MG tablet, Take 0.5 tablets (12.5 mg total) by mouth daily., Disp: 45 tablet, Rfl: 1 .  losartan (COZAAR) 100 MG tablet, Take 1 tablet (100 mg total) by mouth daily. Patient no longer taking HCTZ, Disp: 90 tablet, Rfl: 0 .  metFORMIN (GLUCOPHAGE) 500 MG tablet, Take 1 tablet (500 mg total) by mouth daily., Disp: 90 tablet, Rfl: 0 .  Multiple Vitamin (MULTIVITAMIN) tablet, Take 1 tablet by mouth daily., Disp: , Rfl:  .  potassium chloride (KLOR-CON) 10 MEQ tablet, Take 2 tablets (20 mEq total) by mouth daily., Disp: 180 tablet, Rfl: 0 .  rosuvastatin (CRESTOR) 40 MG tablet, Take 1 tablet (40 mg total) by mouth daily., Disp: 90 tablet, Rfl: 1 .  Blood Pressure KIT, 1 kit by Does not apply route daily., Disp: 1 kit, Rfl: 0  Allergies  Allergen Reactions  . Amoxicillin Itching and Other (See Comments)    Reaction: bump in vaginal region Has patient had a PCN reaction causing immediate rash, facial/tongue/throat swelling, SOB or lightheadedness with hypotension: No Has patient had a PCN reaction causing severe rash involving mucus membranes or skin necrosis: No Has patient had a PCN reaction that required hospitalization: No Has patient had a PCN reaction occurring within the last 10 years: No If all of the above answers are "NO", then may proceed with Cephalosporin use.   . Erythromycin      Hives, rash     ROS  As noted in HPI.   Physical Exam  BP 121/74 (BP Location: Left Arm)   Pulse 70   Temp 98.3 F (36.8 C) (Oral)   Resp 18   Ht _0  (1.6 m)   Wt 86.2 kg   SpO2 100%   BMI 33.66 kg/m   Constitutional: Well developed, well nourished, no acute distress Eyes:  EOMI, conjunctiva normal bilaterally.  PERRLA, no photophobia..  No raccoon eyes. HENT: Normocephalic, atraumatic,mucus membranes moist.  No hemotympanum.  No nasal congestion. Respiratory: Normal inspiratory effort, lungs clear bilaterally Cardiovascular: Normal rate, regular rhythm no murmurs rubs or gallops GI: nondistended skin: No rash, skin intact Back: No C-spine T-spine L-spine tenderness. Musculoskeletal: no deformities, moving all extremities equally.  Bilateral lower extremity edema.  No tenderness. Neurologic: Alert & oriented x 3,  cranial nerves III through XII intact.  Coordination normal.  Speech normal.  GCS 15. Psychiatric: Speech and behavior appropriate   ED Course   Medications - No data to display  Orders Placed This Encounter  Procedures  . ED EKG    Standing Status:   Standing    Number of Occurrences:   1    Order Specific Question:   Reason for Exam    Answer:   Chest Pain  . EKG 12-Lead    Standing Status:   Standing    Number of Occurrences:   1    No results found for this or any previous visit (from the past 24 hour(s)). No results found.  ED Clinical Impression  1. Syncope, unspecified syncope type   2. Nonintractable headache, unspecified chronicity pattern, unspecified headache type      ED Assessment/Plan  Obtaining EKG.  Plan to transfer to the [facility] ED via private vehicle.  Her vitals are normal, this occurred 2 days ago.  She currently has no symptoms, however concern for a syncopal episode in a patient with a cardiac history and other comorbidities.  In the differential is recurrent NSTEMI, STEMI, PE, arrhythmia.  It could have been peripheral  vasodilation since she had just gotten out of the bathtub.  However do not expect peripheral vasodilation to cause chest pain.  Does not sound as if it was vasovagal.  Patient is neurologically intact.  I suspect that she has a concussion.  In the differential is subdural hematoma.  Doubt ICH as it has been 2 days since injury.  Deferring decision for head CT to EDP.  EKG: Sinus bradycardia, rate 59.  Normal axis, normal intervals.  No hypertrophy.  No ST-T wave changes.  Normal EKG.  Transferring to the ED via private vehicle.  Notified charge nurse.  Discussed medical decision-making, rationale for transfer to the emergency department.  Patient agrees to plan.   No orders of the defined types were placed in this encounter.   *This clinic note was created using Dragon dictation software. Therefore, there may be occasional mistakes despite careful proofreading.   ?   Melynda Ripple, MD 06/27/20 1547

## 2020-06-27 NOTE — ED Notes (Signed)
RN unable to obtain labs. RN called lab to request a lab draw.

## 2020-06-28 ENCOUNTER — Emergency Department
Admission: EM | Admit: 2020-06-28 | Discharge: 2020-06-28 | Disposition: A | Discharge status: Home / Self Care | Payer: Self-pay | Attending: Emergency Medicine | Admitting: Emergency Medicine

## 2020-06-28 NOTE — ED Notes (Signed)
No answer when called from lobby 

## 2020-06-30 ENCOUNTER — Encounter: Payer: Self-pay | Admitting: Gerontology

## 2020-06-30 ENCOUNTER — Ambulatory Visit: Payer: Medicaid Other | Admitting: Gerontology

## 2020-06-30 VITALS — BP 111/68 | HR 66 | Ht 63.0 in | Wt 193.0 lb

## 2020-06-30 DIAGNOSIS — I2510 Atherosclerotic heart disease of native coronary artery without angina pectoris: Secondary | ICD-10-CM

## 2020-06-30 DIAGNOSIS — I10 Essential (primary) hypertension: Secondary | ICD-10-CM

## 2020-06-30 DIAGNOSIS — I890 Lymphedema, not elsewhere classified: Secondary | ICD-10-CM

## 2020-06-30 DIAGNOSIS — R7303 Prediabetes: Secondary | ICD-10-CM

## 2020-06-30 MED ORDER — ROSUVASTATIN CALCIUM 40 MG PO TABS: 40.0000 mg | ORAL_TABLET | Freq: Every day | ORAL | 1 refills | Status: DC

## 2020-06-30 NOTE — Progress Notes (Signed)
Established Patient Office Visit  Subjective:  Patient ID: Kelsey Terry, female    DOB: 1961/07/20  Age: 59 y.o. MRN: [id number]  CC:  Chief Complaint  Patient presents with  . Hypertension    HPI Kelsey Terry presents for follow up of hypertension, post emergency room visit and medication refill.  She was seen at the emergency room on 06/27/2020 for right-sided headache status post fall due to syncopal episode 2 days prior.  Head CT scan that was done showed no acute intracranial abnormality, and chest x-ray showed no active cardiopulmonary disease.  Currently, she states that her body ache has improved 80%, she denies chest pain, palpitation, headache and lightheadedness.  She states that she was pleased with the care she received at [facility] because this visit was her last for she has health insurance.  Overall, she reports that she is doing well on offers no further complaint.  Past Medical History:  Diagnosis Date  . Allergies   . Asthma   . Blood dyscrasia    blood clot in right leg  . CAD (coronary artery disease) 05/05/2019  . CAD in native artery    a. NSTEMI 05/16/18: [place] 05/16/18 - ost-pLAD 95% s/p PCI/DES, mLAD 30%, ostD1 70%, ost-pLCx 30%, mRCA 90%, dRCA 20%; b. staged PCI/DES to [facility] 7/1  . Complication of anesthesia    patient stated she had "some kind of reaction to anesthesia, "thought she was having a stroke" during her second cath procedure.  . Diabetes mellitus without complication (HCC)    pre-diabetes  . Diastolic dysfunction    a. TTE 05/16/18: EF 55-60%, no RWMA, Gr1DD, trivial AI, calcified mitral annulus, mild MR, mildly dilated LA  . Dyspnea    with activity  . Hypertension   . Lymphedema    LLE  . Pericarditis    a. ~ 2012 in [place], [place] s/p pericardiocentesis   . Wears contact lenses     Past Surgical History:  Procedure Laterality Date  . ABDOMINAL HYSTERECTOMY    . CESAREAN SECTION    . COLONOSCOPY WITH PROPOFOL N/A 08/13/2019   Procedure: COLONOSCOPY  WITH BIOPSIES;  Surgeon: Lucilla Lame, MD;  Location: [place];  Service: Endoscopy;  Laterality: N/A;  . CORONARY STENT INTERVENTION N/A 05/16/2018   Procedure: CORONARY STENT INTERVENTION;  Surgeon: Wellington Hampshire, MD;  Location: [place] [facility];  Service: Cardiovascular;  Laterality: N/A;  . CORONARY STENT INTERVENTION N/A 05/19/2018   Procedure: CORONARY STENT INTERVENTION;  Surgeon: Wellington Hampshire, MD;  Location: [place] [facility];  Service: Cardiovascular;  Laterality: N/A;  . LEFT HEART CATH AND CORONARY ANGIOGRAPHY N/A 05/16/2018   Procedure: LEFT HEART CATH AND CORONARY ANGIOGRAPHY;  Surgeon: Wellington Hampshire, MD;  Location: [place] [facility];  Service: Cardiovascular;  Laterality: N/A;  . PERICARDIOCENTESIS    . POLYPECTOMY N/A 08/13/2019   Procedure: POLYPECTOMY;  Surgeon: Lucilla Lame, MD;  Location: [place];  Service: Endoscopy;  Laterality: N/A;    Family History  Problem Relation Age of Onset  . Hypertension Mother   . Pancreatic cancer Father   . Diabetes Brother   . Breast cancer Neg Hx     Social History   Socioeconomic History  . Marital status: Widowed    Spouse name: Not on file  . Number of children: Not on file  . Years of education: Not on file  . Highest education level: Not on file  Occupational History  . Not on file  Tobacco Use  . Smoking status: Never Smoker  . Smokeless tobacco: Never Used  Vaping Use  . Vaping Use: Never used  Substance and Sexual Activity  . Alcohol use: Never  . Drug use: Never  . Sexual activity: Not on file  Other Topics Concern  . Not on file  Social History Narrative  . Not on file   Social Determinants of Health   Financial Resource Strain:   . Difficulty of Paying Living Expenses:   Food Insecurity:   . Worried About Charity fundraiser in the Last Year:   . Arboriculturist in the Last Year:   Transportation Needs:   . Film/video editor (Medical):   Marland Kitchen Lack of  Transportation (Non-Medical):   Physical Activity:   . Days of Exercise per Week:   . Minutes of Exercise per Session:   Stress:   . Feeling of Stress :   Social Connections:   . Frequency of Communication with Friends and Family:   . Frequency of Social Gatherings with Friends and Family:   . Attends Religious Services:   . Active Member of Clubs or Organizations:   . Attends Archivist Meetings:   Marland Kitchen Marital Status:   Intimate Partner Violence:   . Fear of Current or Ex-Partner:   . Emotionally Abused:   Marland Kitchen Physically Abused:   . Sexually Abused:     Outpatient Medications Prior to Visit  Medication Sig Dispense Refill  . aspirin EC 81 MG tablet Take 1 tablet (81 mg total) by mouth daily. 90 tablet 1  . CALCIUM CITRATE-VITAMIN D PO Take 1 tablet by mouth 2 (two) times daily.    . chlorthalidone (HYGROTON) 25 MG tablet Take 0.5 tablets (12.5 mg total) by mouth daily. 45 tablet 1  . losartan (COZAAR) 100 MG tablet Take 1 tablet (100 mg total) by mouth daily. Patient no longer taking HCTZ 90 tablet 0  . metFORMIN (GLUCOPHAGE) 500 MG tablet Take 1 tablet (500 mg total) by mouth daily. 90 tablet 0  . Multiple Vitamin (MULTIVITAMIN) tablet Take 1 tablet by mouth daily.    . potassium chloride (KLOR-CON) 10 MEQ tablet Take 2 tablets (20 mEq total) by mouth daily. 180 tablet 0  . rosuvastatin (CRESTOR) 40 MG tablet Take 1 tablet (40 mg total) by mouth daily. 90 tablet 1  . Blood Pressure KIT 1 kit by Does not apply route daily. 1 kit 0   No facility-administered medications prior to visit.    Allergies  Allergen Reactions  . Amoxicillin Itching and Other (See Comments)    Reaction: bump in vaginal region Has patient had a PCN reaction causing immediate rash, facial/tongue/throat swelling, SOB or lightheadedness with hypotension: No Has patient had a PCN reaction causing severe rash involving mucus membranes or skin necrosis: No Has patient had a PCN reaction that required  hospitalization: No Has patient had a PCN reaction occurring within the last 10 years: No If all of the above answers are "NO", then may proceed with Cephalosporin use.   . Erythromycin Hives and Rash    ROS Review of Systems  Constitutional: Negative.   Eyes: Negative.   Respiratory: Negative.   Cardiovascular: Positive for leg swelling (Chronic lymph edema).  Neurological: Negative.   Hematological:       Left upper arm bruise from fall      Objective:    Physical Exam HENT:     Head: Normocephalic and atraumatic.  Eyes:  Extraocular Movements: Extraocular movements intact.     Conjunctiva/sclera: Conjunctivae normal.     Pupils: Pupils are equal, round, and reactive to light.  Cardiovascular:     Rate and Rhythm: Normal rate and regular rhythm.     Pulses: Normal pulses.     Heart sounds: Normal heart sounds.  Pulmonary:     Effort: Pulmonary effort is normal.     Breath sounds: Normal breath sounds.  Musculoskeletal:        General: Normal range of motion.     Left lower leg: Edema (chronic lymph edema) present.  Skin:    General: Skin is warm and dry.  Neurological:     General: No focal deficit present.     Mental Status: She is alert and oriented to person, place, and time. Mental status is at baseline.  Psychiatric:        Mood and Affect: Mood normal.        Behavior: Behavior normal.        Thought Content: Thought content normal.        Judgment: Judgment normal.     BP 111/68 (BP Location: Right Arm, Patient Position: Sitting)   Pulse 66   Ht 5' 3"  (1.6 m)   Wt 193 lb (87.5 kg)   SpO2 98%   BMI 34.19 kg/m  Wt Readings from Last 3 Encounters:  06/30/20 193 lb (87.5 kg)  06/27/20 190 lb (86.2 kg)  06/27/20 190 lb (86.2 kg)   She was encouraged to continue on her weight loss regimen.  Health Maintenance Due  Topic Date Due  . Hepatitis C Screening  Never done  . HIV Screening  Never done  . TETANUS/TDAP  Never done  . PAP  SMEAR-Modifier  Never done  . INFLUENZA VACCINE  06/19/2020    There are no preventive care reminders to display for this patient.  Lab Results  Component Value Date   TSH 1.350 11/20/2018   Lab Results  Component Value Date   WBC 5.3 06/27/2020   HGB 14.0 06/27/2020   HCT 44.5 06/27/2020   MCV 84.6 06/27/2020   PLT 210 06/27/2020   Lab Results  Component Value Date   NA 142 06/27/2020   K 3.5 06/27/2020   CO2 26 06/27/2020   GLUCOSE 77 06/27/2020   BUN 13 06/27/2020   CREATININE 1.02 (H) 06/27/2020   BILITOT 0.6 01/20/2020   ALKPHOS 71 01/20/2020   AST 21 01/20/2020   ALT 16 01/20/2020   PROT 7.8 01/20/2020   ALBUMIN 4.3 01/20/2020   CALCIUM 10.2 06/27/2020   ANIONGAP 12 06/27/2020   Lab Results  Component Value Date   CHOL 144 01/20/2020   Lab Results  Component Value Date   HDL 50 01/20/2020   Lab Results  Component Value Date   LDLCALC 80 01/20/2020   Lab Results  Component Value Date   TRIG 68 01/20/2020   Lab Results  Component Value Date   CHOLHDL 2.9 01/20/2020   Lab Results  Component Value Date   HGBA1C 6.0 (H) 05/25/2020      Assessment & Plan:    1. Coronary artery disease involving native heart without angina pectoris, unspecified vessel or lesion type -She will continue on current medication regimen. -Low fat Diet, like low fat dairy products eg skimmed milk -Avoid any fried food -Regular exercise/walk -Goal for Total Cholesterol is less than 200 -Goal for bad cholesterol LDL is less than 70 -Goal for Good cholesterol HDL is  more than 45 -Goal for Triglyceride is less than 150 - rosuvastatin (CRESTOR) 40 MG tablet; Take 1 tablet (40 mg total) by mouth daily.  Dispense: 90 tablet; Refill: 1  2. Essential hypertension -Her blood pressure is under control and she will continue on current treatment regimen. -Low salt DASH diet -Take medications regularly on time -Exercise regularly as tolerated -Check blood pressure at least  once a week at home or a nearby pharmacy and record -Goal is less than 140/90 and normal blood pressure is 120/80  3. Prediabetes -She will continue on current treatment regimen and was advised to continue on low carbohydrate/no concentrated sweet diet.  4. Lymphedema of left leg -She was advised to wear compression stockings, elevate legs while sitting down and follow-up with vascular surgery.    Follow-up: She has no follow-up appointment because she has health insurance coverage.  [facility] staff wishes how well with her care.  She was advised to schedule an appointment with her new provider.   Han Lysne Jerold Coombe, NP

## 2020-06-30 NOTE — Patient Instructions (Addendum)
DASH Eating Plan DASH stands for "Dietary Approaches to Stop Hypertension." The DASH eating plan is a healthy eating plan that has been shown to reduce high blood pressure (hypertension). It may also reduce your risk for type 2 diabetes, heart disease, and stroke. The DASH eating plan may also help with weight loss. What are tips for following this plan?  General guidelines  Avoid eating more than 2,300 mg (milligrams) of salt (sodium) a day. If you have hypertension, you may need to reduce your sodium intake to 1,500 mg a day.  Limit alcohol intake to no more than 1 drink a day for nonpregnant women and 2 drinks a day for men. One drink equals 12 oz of beer, 5 oz of wine, or 1 oz of hard liquor.  Work with your health care provider to maintain a healthy body weight or to lose weight. Ask what an ideal weight is for you.  Get at least 30 minutes of exercise that causes your heart to beat faster (aerobic exercise) most days of the week. Activities may include walking, swimming, or biking.  Work with your health care provider or diet and nutrition specialist (dietitian) to adjust your eating plan to your individual calorie needs. Reading food labels   Check food labels for the amount of sodium per serving. Choose foods with less than 5 percent of the Daily Value of sodium. Generally, foods with less than 300 mg of sodium per serving fit into this eating plan.  To find whole grains, look for the word "whole" as the first word in the ingredient list. Shopping  Buy products labeled as "low-sodium" or "no salt added."  Buy fresh foods. Avoid canned foods and premade or frozen meals. Cooking  Avoid adding salt when cooking. Use salt-free seasonings or herbs instead of table salt or sea salt. Check with your health care provider or pharmacist before using salt substitutes.  Do not fry foods. Cook foods using healthy methods such as baking, boiling, grilling, and broiling instead.  Cook with  heart-healthy oils, such as olive, canola, soybean, or sunflower oil. Meal planning  Eat a balanced diet that includes: ? 5 or more servings of fruits and vegetables each day. At each meal, try to fill half of your plate with fruits and vegetables. ? Up to 6-8 servings of whole grains each day. ? Less than 6 oz of lean meat, poultry, or fish each day. A 3-oz serving of meat is about the same size as a deck of cards. One egg equals 1 oz. ? 2 servings of low-fat dairy each day. ? A serving of nuts, seeds, or beans 5 times each week. ? Heart-healthy fats. Healthy fats called Omega-3 fatty acids are found in foods such as flaxseeds and coldwater fish, like sardines, salmon, and mackerel.  Limit how much you eat of the following: ? Canned or prepackaged foods. ? Food that is high in trans fat, such as fried foods. ? Food that is high in saturated fat, such as fatty meat. ? Sweets, desserts, sugary drinks, and other foods with added sugar. ? Full-fat dairy products.  Do not salt foods before eating.  Try to eat at least 2 vegetarian meals each week.  Eat more home-cooked food and less restaurant, buffet, and fast food.  When eating at a restaurant, ask that your food be prepared with less salt or no salt, if possible. What foods are recommended? The items listed may not be a complete list. Talk with your dietitian about   what dietary choices are best for you. Grains Whole-grain or whole-wheat bread. Whole-grain or whole-wheat pasta. Brown rice. Oatmeal. Quinoa. Bulgur. Whole-grain and low-sodium cereals. Pita bread. Low-fat, low-sodium crackers. Whole-wheat flour tortillas. Vegetables Fresh or frozen vegetables (raw, steamed, roasted, or grilled). Low-sodium or reduced-sodium tomato and vegetable juice. Low-sodium or reduced-sodium tomato sauce and tomato paste. Low-sodium or reduced-sodium canned vegetables. Fruits All fresh, dried, or frozen fruit. Canned fruit in natural juice (without  added sugar). Meat and other protein foods Skinless chicken or turkey. Ground chicken or turkey. Pork with fat trimmed off. Fish and seafood. Egg whites. Dried beans, peas, or lentils. Unsalted nuts, nut butters, and seeds. Unsalted canned beans. Lean cuts of beef with fat trimmed off. Low-sodium, lean deli meat. Dairy Low-fat (1%) or fat-free (skim) milk. Fat-free, low-fat, or reduced-fat cheeses. Nonfat, low-sodium ricotta or cottage cheese. Low-fat or nonfat yogurt. Low-fat, low-sodium cheese. Fats and oils Soft margarine without trans fats. Vegetable oil. Low-fat, reduced-fat, or light mayonnaise and salad dressings (reduced-sodium). Canola, safflower, olive, soybean, and sunflower oils. Avocado. Seasoning and other foods Herbs. Spices. Seasoning mixes without salt. Unsalted popcorn and pretzels. Fat-free sweets. What foods are not recommended? The items listed may not be a complete list. Talk with your dietitian about what dietary choices are best for you. Grains Baked goods made with fat, such as croissants, muffins, or some breads. Dry pasta or rice meal packs. Vegetables Creamed or fried vegetables. Vegetables in a cheese sauce. Regular canned vegetables (not low-sodium or reduced-sodium). Regular canned tomato sauce and paste (not low-sodium or reduced-sodium). Regular tomato and vegetable juice (not low-sodium or reduced-sodium). Pickles. Olives. Fruits Canned fruit in a light or heavy syrup. Fried fruit. Fruit in cream or butter sauce. Meat and other protein foods Fatty cuts of meat. Ribs. Fried meat. Bacon. Sausage. Bologna and other processed lunch meats. Salami. Fatback. Hotdogs. Bratwurst. Salted nuts and seeds. Canned beans with added salt. Canned or smoked fish. Whole eggs or egg yolks. Chicken or turkey with skin. Dairy Whole or 2% milk, cream, and half-and-half. Whole or full-fat cream cheese. Whole-fat or sweetened yogurt. Full-fat cheese. Nondairy creamers. Whipped toppings.  Processed cheese and cheese spreads. Fats and oils Butter. Stick margarine. Lard. Shortening. Ghee. Bacon fat. Tropical oils, such as coconut, palm kernel, or palm oil. Seasoning and other foods Salted popcorn and pretzels. Onion salt, garlic salt, seasoned salt, table salt, and sea salt. Worcestershire sauce. Tartar sauce. Barbecue sauce. Teriyaki sauce. Soy sauce, including reduced-sodium. Steak sauce. Canned and packaged gravies. Fish sauce. Oyster sauce. Cocktail sauce. Horseradish that you find on the shelf. Ketchup. Mustard. Meat flavorings and tenderizers. Bouillon cubes. Hot sauce and Tabasco sauce. Premade or packaged marinades. Premade or packaged taco seasonings. Relishes. Regular salad dressings. Where to find more information:  National Heart, Lung, and Blood Institute: www.nhlbi.nih.gov  American Heart Association: www.heart.org Summary  The DASH eating plan is a healthy eating plan that has been shown to reduce high blood pressure (hypertension). It may also reduce your risk for type 2 diabetes, heart disease, and stroke.  With the DASH eating plan, you should limit salt (sodium) intake to 2,300 mg a day. If you have hypertension, you may need to reduce your sodium intake to 1,500 mg a day.  When on the DASH eating plan, aim to eat more fresh fruits and vegetables, whole grains, lean proteins, low-fat dairy, and heart-healthy fats.  Work with your health care provider or diet and nutrition specialist (dietitian) to adjust your eating plan to your   individual calorie needs. This information is not intended to replace advice given to you by your health care provider. Make sure you discuss any questions you have with your health care provider. Document Revised: 10/18/2017 Document Reviewed: 10/29/2016 Elsevier Patient Education  2020 [place].  Prediabetes Eating Plan Prediabetes is a condition that causes blood sugar (glucose) levels to be higher than normal. This increases  the risk for developing diabetes. In order to prevent diabetes from developing, your health care provider may recommend a diet and other lifestyle changes to help you:  Control your blood glucose levels.  Improve your cholesterol levels.  Manage your blood pressure. Your health care provider may recommend working with a diet and nutrition specialist (dietitian) to make a meal plan that is best for you. What are tips for following this plan? Lifestyle  Set weight loss goals with the help of your health care team. It is recommended that most people with prediabetes lose 7% of their current body weight.  Exercise for at least 30 minutes at least 5 days a week.  Attend a support group or seek ongoing support from a mental health counselor.  Take over-the-counter and prescription medicines only as told by your health care provider. Reading food labels  Read food labels to check the amount of fat, salt (sodium), and sugar in prepackaged foods. Avoid foods that have: ? Saturated fats. ? Trans fats. ? Added sugars.  Avoid foods that have more than 300 milligrams (mg) of sodium per serving. Limit your daily sodium intake to less than 2,300 mg each day. Shopping  Avoid buying pre-made and processed foods. Cooking  Cook with olive oil. Do not use butter, lard, or ghee.  Bake, broil, grill, or boil foods. Avoid frying. Meal planning   Work with your dietitian to develop an eating plan that is right for you. This may include: ? Tracking how many calories you take in. Use a food diary, notebook, or mobile application to track what you eat at each meal. ? Using the glycemic index (GI) to plan your meals. The index tells you how quickly a food will raise your blood glucose. Choose low-GI foods. These foods take a longer time to raise blood glucose.  Consider following a Mediterranean diet. This diet includes: ? Several servings each day of fresh fruits and vegetables. ? Eating fish at  least twice a week. ? Several servings each day of whole grains, beans, nuts, and seeds. ? Using olive oil instead of other fats. ? Moderate alcohol consumption. ? Eating small amounts of red meat and whole-fat dairy.  If you have high blood pressure, you may need to limit your sodium intake or follow a diet such as the DASH eating plan. DASH is an eating plan that aims to lower high blood pressure. What foods are recommended? The items listed below may not be a complete list. Talk with your dietitian about what dietary choices are best for you. Grains Whole grains, such as whole-wheat or whole-grain breads, crackers, cereals, and pasta. Unsweetened oatmeal. Bulgur. Barley. Quinoa. Brown rice. Corn or whole-wheat flour tortillas or taco shells. Vegetables Lettuce. Spinach. Peas. Beets. Cauliflower. Cabbage. Broccoli. Carrots. Tomatoes. Squash. Eggplant. Herbs. Peppers. Onions. Cucumbers. Brussels sprouts. Fruits Berries. Bananas. Apples. Oranges. Grapes. Papaya. Mango. Pomegranate. Kiwi. Grapefruit. Cherries. Meats and other protein foods Seafood. Poultry without skin. Lean cuts of pork and beef. Tofu. Eggs. Nuts. Beans. Dairy Low-fat or fat-free dairy products, such as yogurt, cottage cheese, and cheese. Beverages Water. Tea. Coffee.  Sugar-free or diet soda. Seltzer water. Lowfat or no-fat milk. Milk alternatives, such as soy or almond milk. Fats and oils Olive oil. Canola oil. Sunflower oil. Grapeseed oil. Avocado. Walnuts. Sweets and desserts Sugar-free or low-fat pudding. Sugar-free or low-fat ice cream and other frozen treats. Seasoning and other foods Herbs. Sodium-free spices. Mustard. Relish. Low-fat, low-sugar ketchup. Low-fat, low-sugar barbecue sauce. Low-fat or fat-free mayonnaise. What foods are not recommended? The items listed below may not be a complete list. Talk with your dietitian about what dietary choices are best for you. Grains Refined white flour and flour  products, such as bread, pasta, snack foods, and cereals. Vegetables Canned vegetables. Frozen vegetables with butter or cream sauce. Fruits Fruits canned with syrup. Meats and other protein foods Fatty cuts of meat. Poultry with skin. Breaded or fried meat. Processed meats. Dairy Full-fat yogurt, cheese, or milk. Beverages Sweetened drinks, such as sweet iced tea and soda. Fats and oils Butter. Lard. Ghee. Sweets and desserts Baked goods, such as cake, cupcakes, pastries, cookies, and cheesecake. Seasoning and other foods Spice mixes with added salt. Ketchup. Barbecue sauce. Mayonnaise. Summary  To prevent diabetes from developing, you may need to make diet and other lifestyle changes to help control blood sugar, improve cholesterol levels, and manage your blood pressure.  Set weight loss goals with the help of your health care team. It is recommended that most people with prediabetes lose 7 percent of their current body weight.  Consider following a Mediterranean diet that includes plenty of fresh fruits and vegetables, whole grains, beans, nuts, seeds, fish, lean meat, low-fat dairy, and healthy oils. This information is not intended to replace advice given to you by your health care provider. Make sure you discuss any questions you have with your health care provider. Document Revised: 02/27/2019 Document Reviewed: 01/09/2017 Elsevier Patient Education  2020 [employer].

## 2020-08-03 ENCOUNTER — Other Ambulatory Visit: Payer: Self-pay | Admitting: Gerontology

## 2020-08-03 DIAGNOSIS — I2510 Atherosclerotic heart disease of native coronary artery without angina pectoris: Secondary | ICD-10-CM

## 2020-08-03 DIAGNOSIS — I10 Essential (primary) hypertension: Secondary | ICD-10-CM

## 2020-08-03 MED ORDER — METFORMIN HCL 500 MG PO TABS: 500.0000 mg | ORAL_TABLET | Freq: Every day | ORAL | 0 refills | Status: DC

## 2020-08-03 MED ORDER — CHLORTHALIDONE 25 MG PO TABS: 12.5000 mg | ORAL_TABLET | Freq: Every day | ORAL | 1 refills | Status: DC

## 2020-08-03 MED ORDER — POTASSIUM CHLORIDE ER 10 MEQ PO TBCR: 20.0000 meq | EXTENDED_RELEASE_TABLET | Freq: Every day | ORAL | 0 refills | Status: DC

## 2020-08-03 MED ORDER — ASPIRIN EC 81 MG PO TBEC: 81.0000 mg | DELAYED_RELEASE_TABLET | Freq: Every day | ORAL | 1 refills | Status: DC

## 2020-08-03 MED ORDER — ROSUVASTATIN CALCIUM 40 MG PO TABS: 40.0000 mg | ORAL_TABLET | Freq: Every day | ORAL | 1 refills | Status: DC

## 2020-08-03 MED ORDER — LOSARTAN POTASSIUM 100 MG PO TABS: 100.0000 mg | ORAL_TABLET | Freq: Every day | ORAL | 0 refills | Status: DC

## 2020-08-23 ENCOUNTER — Encounter: Payer: Self-pay | Admitting: Family Medicine

## 2020-08-23 ENCOUNTER — Other Ambulatory Visit: Payer: Self-pay

## 2020-08-23 ENCOUNTER — Ambulatory Visit (INDEPENDENT_AMBULATORY_CARE_PROVIDER_SITE_OTHER): Payer: 59 | Admitting: Family Medicine

## 2020-08-23 VITALS — BP 120/80 | HR 80 | Ht 63.0 in | Wt 192.0 lb

## 2020-08-23 DIAGNOSIS — R7303 Prediabetes: Secondary | ICD-10-CM

## 2020-08-23 DIAGNOSIS — I2510 Atherosclerotic heart disease of native coronary artery without angina pectoris: Secondary | ICD-10-CM

## 2020-08-23 DIAGNOSIS — E7801 Familial hypercholesterolemia: Secondary | ICD-10-CM

## 2020-08-23 DIAGNOSIS — E78019 Familial hypercholesterolemia, unspecified: Secondary | ICD-10-CM | POA: Insufficient documentation

## 2020-08-23 DIAGNOSIS — I890 Lymphedema, not elsewhere classified: Secondary | ICD-10-CM

## 2020-08-23 DIAGNOSIS — Z7689 Persons encountering health services in other specified circumstances: Secondary | ICD-10-CM | POA: Diagnosis not present

## 2020-08-23 DIAGNOSIS — I10 Essential (primary) hypertension: Secondary | ICD-10-CM | POA: Diagnosis not present

## 2020-08-23 NOTE — Patient Instructions (Signed)

## 2020-08-23 NOTE — Progress Notes (Signed)
Date:  08/23/2020   Name:  Kelsey Terry   DOB:  [birth year]   MRN:  [id number]   Chief Complaint: Establish Care (moved from [place] to be with family)  Patient is a 59 year old female who presents for a establishment of care exam. The patient reports the following problems: prediabetes/hypertension/hyperlipidemea. Health maintenance has been reviewed up to date.   Lab Results  Component Value Date   CREATININE 1.02 (H) 06/27/2020   BUN 13 06/27/2020   NA 142 06/27/2020   K 3.5 06/27/2020   CL 104 06/27/2020   CO2 26 06/27/2020   Lab Results  Component Value Date   CHOL 144 01/20/2020   HDL 50 01/20/2020   LDLCALC 80 01/20/2020   TRIG 68 01/20/2020   CHOLHDL 2.9 01/20/2020   Lab Results  Component Value Date   TSH 1.350 11/20/2018   Lab Results  Component Value Date   HGBA1C 6.0 (H) 05/25/2020   Lab Results  Component Value Date   WBC 5.3 06/27/2020   HGB 14.0 06/27/2020   HCT 44.5 06/27/2020   MCV 84.6 06/27/2020   PLT 210 06/27/2020   Lab Results  Component Value Date   ALT 16 01/20/2020   AST 21 01/20/2020   ALKPHOS 71 01/20/2020   BILITOT 0.6 01/20/2020     Review of Systems  Constitutional: Negative.  Negative for chills, fatigue, fever and unexpected weight change.  HENT: Negative for congestion, ear discharge, ear pain, rhinorrhea, sinus pressure, sneezing and sore throat.   Eyes: Negative for photophobia, pain, discharge, redness and itching.  Respiratory: Negative for cough, shortness of breath, wheezing and stridor.   Gastrointestinal: Negative for abdominal pain, blood in stool, constipation, diarrhea, nausea and vomiting.  Endocrine: Negative for cold intolerance, heat intolerance, polydipsia, polyphagia and polyuria.  Genitourinary: Negative for dysuria, flank pain, frequency, hematuria, menstrual problem, pelvic pain, urgency, vaginal bleeding and vaginal discharge.  Musculoskeletal: Negative for arthralgias, back pain and myalgias.    Skin: Negative for rash.  Allergic/Immunologic: Negative for environmental allergies and food allergies.  Neurological: Negative for dizziness, weakness, light-headedness, numbness and headaches.  Hematological: Negative for adenopathy. Does not bruise/bleed easily.  Psychiatric/Behavioral: Negative for dysphoric mood. The patient is not nervous/anxious.     Patient Active Problem List   Diagnosis Date Noted  . Acute bilateral knee pain 01/13/2020  . Health care maintenance 11/24/2019  . H/O elevated lipids 11/04/2019  . Lymphedema 08/26/2019  . Chronic venous insufficiency 08/26/2019  . Encounter for screening colonoscopy   . Polyp of sigmoid colon   . Polyp of ascending colon   . Lymphedema of left leg 06/16/2019  . Prediabetes 06/16/2019  . Abnormal laboratory test result 06/16/2019  . Prediabetes 05/20/2019  . Essential hypertension 05/19/2019  . Abscess of left buttock 05/05/2019  . Essential hypertension 05/05/2019  . CAD (coronary artery disease) 05/05/2019  . NSTEMI (non-ST elevated myocardial infarction) ([place]) 05/16/2018    Allergies  Allergen Reactions  . Amoxicillin Itching and Other (See Comments)    Reaction: bump in vaginal region Has patient had a PCN reaction causing immediate rash, facial/tongue/throat swelling, SOB or lightheadedness with hypotension: No Has patient had a PCN reaction causing severe rash involving mucus membranes or skin necrosis: No Has patient had a PCN reaction that required hospitalization: No Has patient had a PCN reaction occurring within the last 10 years: No If all of the above answers are "NO", then may proceed with Cephalosporin use.   . Erythromycin  Hives and Rash    Past Surgical History:  Procedure Laterality Date  . ABDOMINAL HYSTERECTOMY    . CESAREAN SECTION    . COLONOSCOPY WITH PROPOFOL N/A 08/13/2019   Procedure: COLONOSCOPY WITH BIOPSIES;  Surgeon: Lucilla Lame, MD;  Location: [facility];  Service:  Endoscopy;  Laterality: N/A;  . CORONARY STENT INTERVENTION N/A 05/16/2018   Procedure: CORONARY STENT INTERVENTION;  Surgeon: Wellington Hampshire, MD;  Location: [facility];  Service: Cardiovascular;  Laterality: N/A;  . CORONARY STENT INTERVENTION N/A 05/19/2018   Procedure: CORONARY STENT INTERVENTION;  Surgeon: Wellington Hampshire, MD;  Location: [facility];  Service: Cardiovascular;  Laterality: N/A;  . LEFT HEART CATH AND CORONARY ANGIOGRAPHY N/A 05/16/2018   Procedure: LEFT HEART CATH AND CORONARY ANGIOGRAPHY;  Surgeon: Wellington Hampshire, MD;  Location: [facility];  Service: Cardiovascular;  Laterality: N/A;  . PERICARDIOCENTESIS    . POLYPECTOMY N/A 08/13/2019   Procedure: POLYPECTOMY;  Surgeon: Lucilla Lame, MD;  Location: [facility];  Service: Endoscopy;  Laterality: N/A;    Social History   Tobacco Use  . Smoking status: Never Smoker  . Smokeless tobacco: Never Used  Vaping Use  . Vaping Use: Never used  Substance Use Topics  . Alcohol use: Never  . Drug use: Never     Medication list has been reviewed and updated.  Current Meds  Medication Sig  . aspirin EC 81 MG tablet Take 1 tablet (81 mg total) by mouth daily.  . Blood Pressure KIT 1 kit by Does not apply route daily.  Marland Kitchen CALCIUM CITRATE-VITAMIN D PO Take 1 tablet by mouth 2 (two) times daily.  . chlorthalidone (HYGROTON) 25 MG tablet Take 0.5 tablets (12.5 mg total) by mouth daily.  Marland Kitchen losartan (COZAAR) 100 MG tablet Take 1 tablet (100 mg total) by mouth daily. Patient no longer taking HCTZ  . metFORMIN (GLUCOPHAGE) 500 MG tablet Take 1 tablet (500 mg total) by mouth daily.  . Multiple Vitamin (MULTIVITAMIN) tablet Take 1 tablet by mouth daily.  . potassium chloride (KLOR-CON) 10 MEQ tablet Take 2 tablets (20 mEq total) by mouth daily.  . rosuvastatin (CRESTOR) 40 MG tablet Take 1 tablet (40 mg total) by mouth daily.    PHQ 2/9 Scores 08/23/2020 01/28/2020 08/26/2018 06/09/2018  PHQ - 2  Score 0 0 0 0  PHQ- 9 Score 0 - 0 5    GAD 7 : Generalized Anxiety Score 08/23/2020  Nervous, Anxious, on Edge 0  Control/stop worrying 1  Worry too much - different things 0  Trouble relaxing 0  Restless 0  Easily annoyed or irritable 0  Afraid - awful might happen 0  Total GAD 7 Score 1  Anxiety Difficulty Not difficult at all    BP Readings from Last 3 Encounters:  08/23/20 120/80  06/30/20 111/68  06/28/20 (!) 132/97    Physical Exam Vitals and nursing note reviewed.  Constitutional:      Appearance: She is well-developed.  HENT:     Head: Normocephalic.     Right Ear: Tympanic membrane and external ear normal. There is no impacted cerumen.     Left Ear: Tympanic membrane and external ear normal. There is no impacted cerumen.     Nose: Nose normal.     Mouth/Throat:     Mouth: Mucous membranes are moist.  Eyes:     General: Lids are everted, no foreign bodies appreciated. No scleral icterus.  Left eye: No foreign body or hordeolum.     Conjunctiva/sclera: Conjunctivae normal.     Right eye: Right conjunctiva is not injected.     Left eye: Left conjunctiva is not injected.     Pupils: Pupils are equal, round, and reactive to light.  Neck:     Thyroid: No thyromegaly.     Vascular: No JVD.     Trachea: No tracheal deviation.  Cardiovascular:     Rate and Rhythm: Normal rate and regular rhythm.     Heart sounds: Normal heart sounds. No murmur heard.  No friction rub. No gallop.   Pulmonary:     Effort: Pulmonary effort is normal. No respiratory distress.     Breath sounds: Normal breath sounds. No stridor. No wheezing, rhonchi or rales.  Chest:     Chest wall: No tenderness.  Abdominal:     General: Bowel sounds are normal.     Palpations: Abdomen is soft. There is no mass.     Tenderness: There is no abdominal tenderness. There is no guarding or rebound.  Musculoskeletal:        General: No tenderness. Normal range of motion.     Cervical back: Normal  range of motion and neck supple.  Lymphadenopathy:     Cervical: No cervical adenopathy.  Skin:    General: Skin is warm.     Findings: No bruising, erythema or rash.  Neurological:     Mental Status: She is alert and oriented to person, place, and time.     Cranial Nerves: No cranial nerve deficit.     Deep Tendon Reflexes: Reflexes normal.  Psychiatric:        Mood and Affect: Mood is not anxious or depressed.     Wt Readings from Last 3 Encounters:  08/23/20 192 lb (87.1 kg)  06/30/20 193 lb (87.5 kg)  06/27/20 190 lb (86.2 kg)    BP 120/80   Pulse 80   Ht _0  (1.6 m)   Wt 192 lb (87.1 kg)   BMI 34.01 kg/m   Assessment and Plan: 1. Establishing care with new doctor, encounter for Patient establishing care with new physician.  Patient's chart was reviewed for previous encounters, most recent labs, most recent imaging, care everywhere.  2. Prediabetes With history of prediabetes.  Chronic.  Controlled.  Stable.  We will continue current dosing of Metformin 500 twice a day.  Will check microalbuminuria.  A1c was reviewed at 6.0. - Microalbumin, urine  3. Essential hypertension Chronic.  Controlled.  Stable.  Blood pressure is 120/80.  Patient currently is on chlorthalidone 12.5 mg daily (one half of a 25).  4. Familial hypercholesterolemia Chronic.  Controlled.  Stable.  Continue rosuvastatin 20 mg once a day.  5. Coronary artery disease involving native heart without angina pectoris, unspecified vessel or lesion type Relatively new onset status post non-STEMI MI.  Currently followed by Dr. Fletcher Anon.  Currently stable.  6. Lymphedema of left leg Chronic.  Controlled.  Patient is followed by Dr. Delana Meyer and will be rechecked in January at which time discussion of a lymphedema pump will be rediscussed.

## 2020-08-24 LAB — MICROALBUMIN, URINE: Microalbumin, Urine: 3.6 ug/mL

## 2020-08-26 ENCOUNTER — Other Ambulatory Visit: Payer: Self-pay

## 2020-09-05 IMAGING — CR DG CHEST 2V
1 series · 2 of 2 positions shown · non-contrast
Comparison: Chest radiograph dated 05/15/2018

CLINICAL DATA: 58-year-old female with chest pain.

EXAM:
CHEST - 2 VIEW

[Series 1: w chest pa · 0.14mm/px · 2 of 2 slices shown]
[im 1/2]
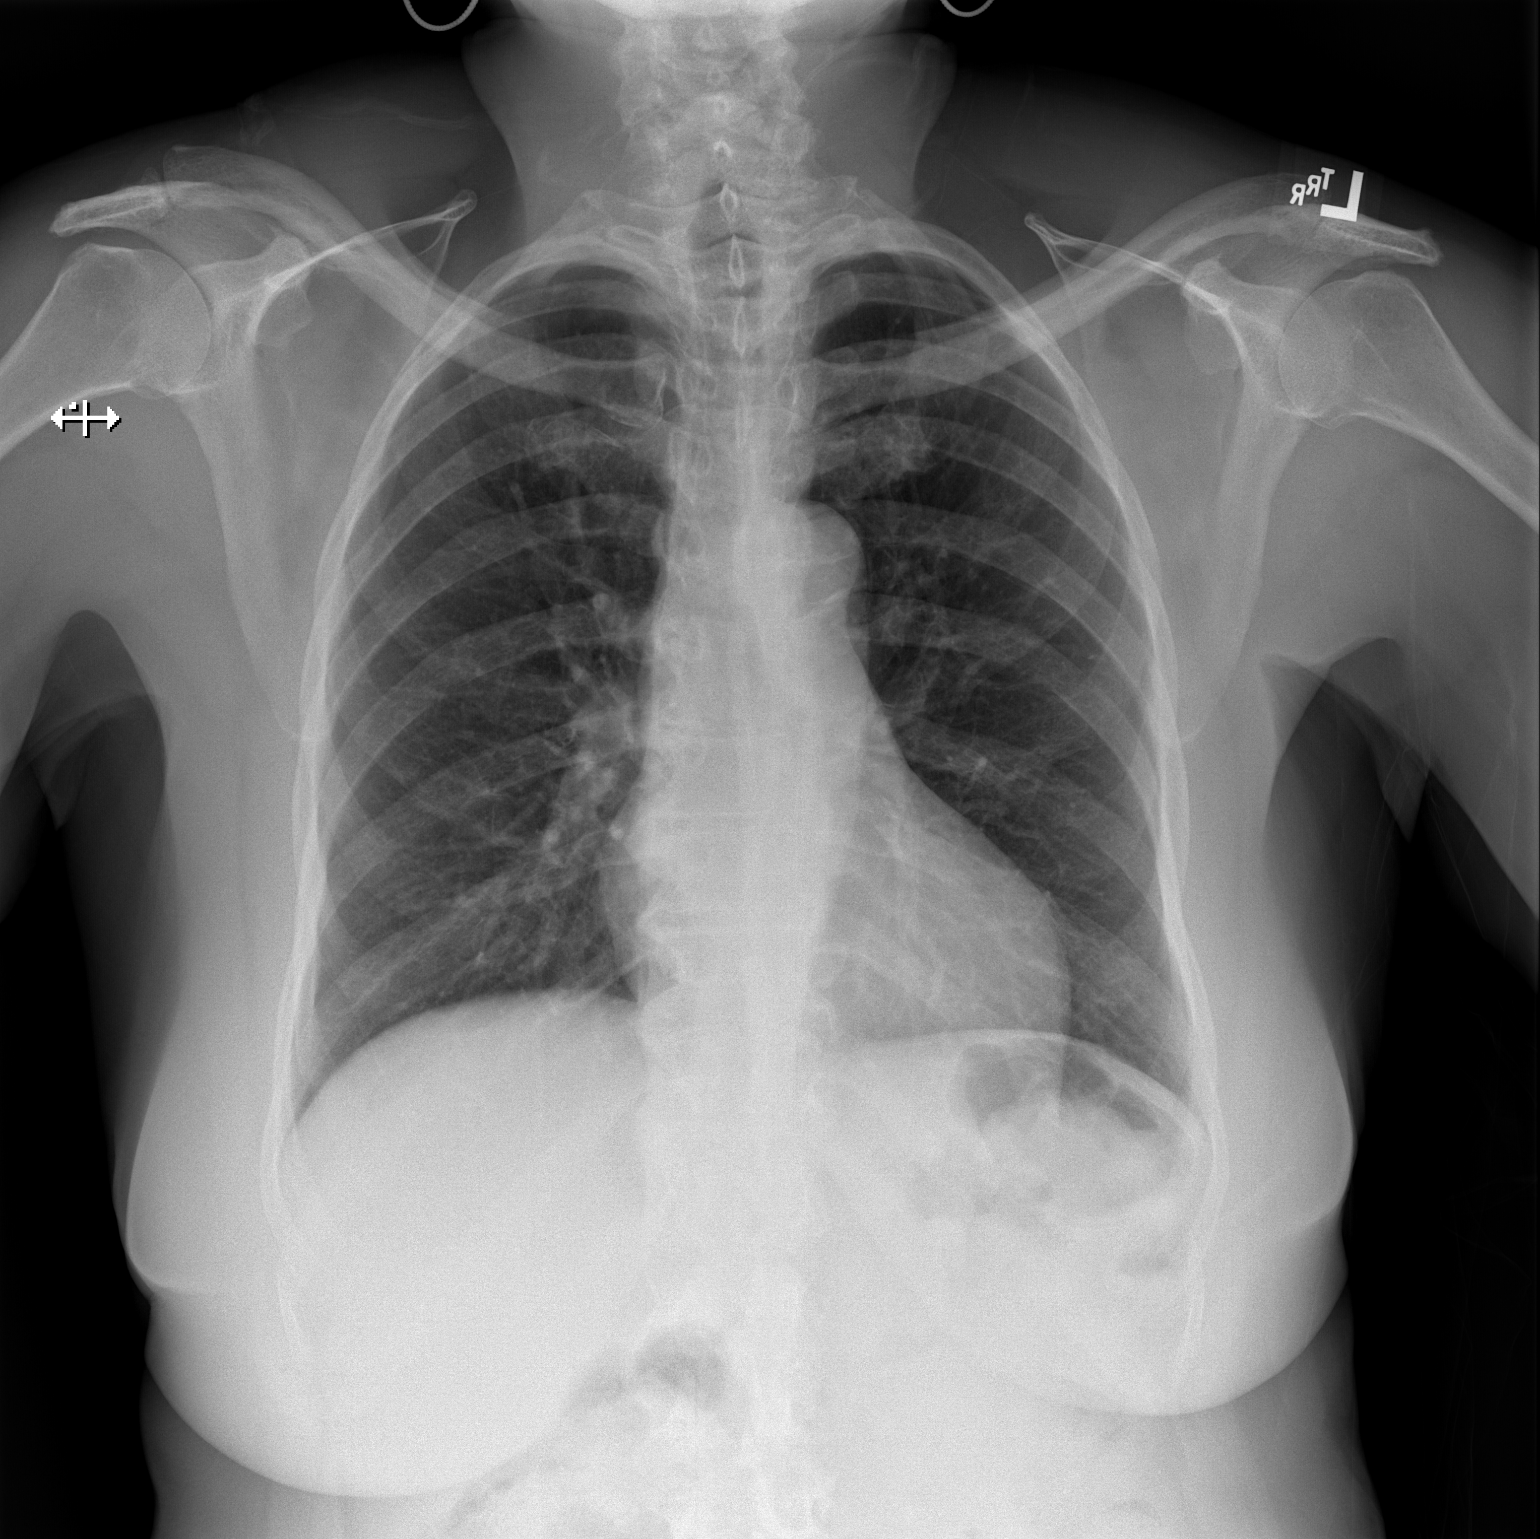
[im 2/2]
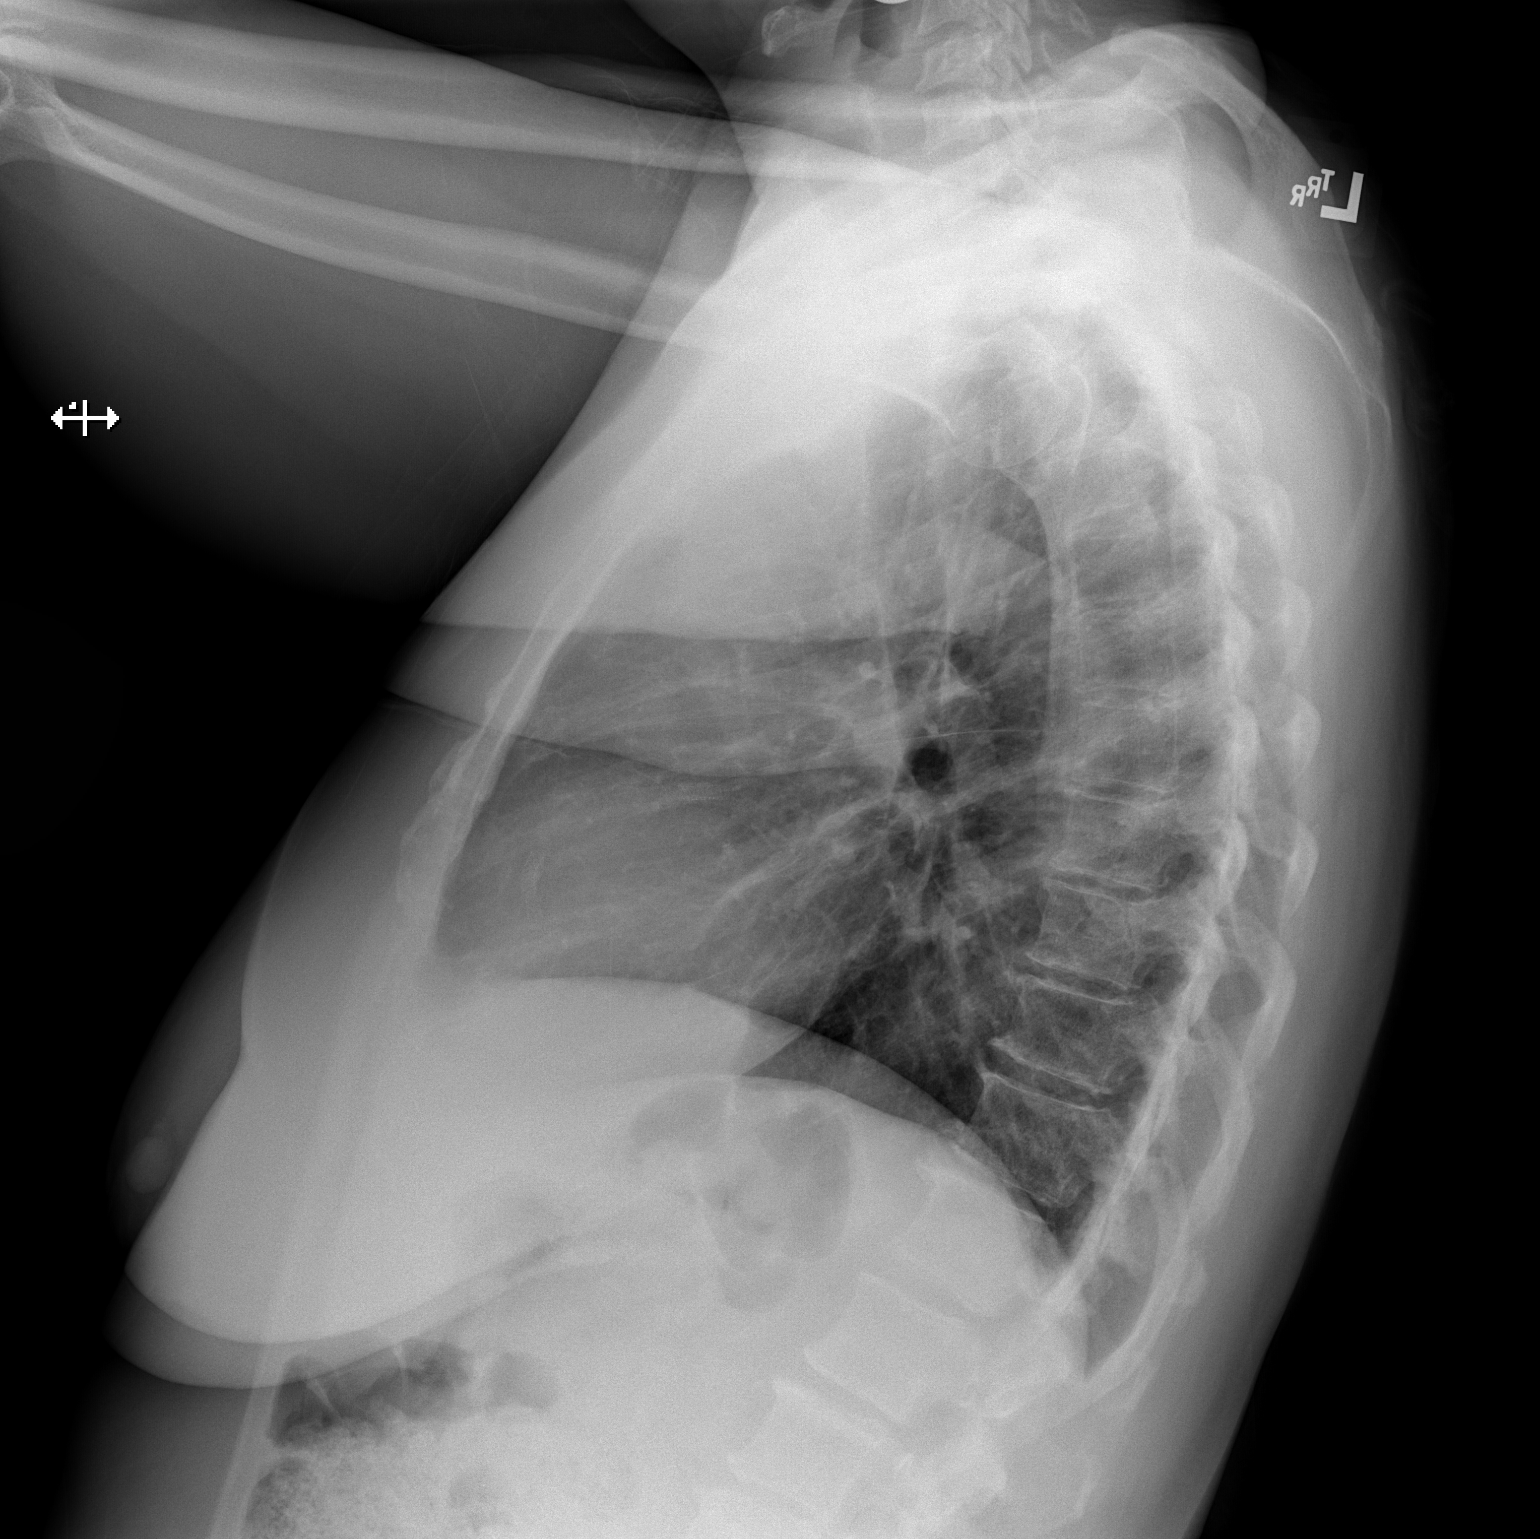

[2 of 2 positions shown; findings below may reference images not displayed]

FINDINGS: The no focal consolidation, pleural effusion, or pneumothorax. The
cardiac silhouette is within limits. No acute osseous pathology.
IMPRESSION: No active cardiopulmonary disease.

## 2020-09-14 ENCOUNTER — Telehealth: Payer: Self-pay

## 2020-09-14 NOTE — Telephone Encounter (Signed)
Copied from [place] [phone]. Topic: General - Other >> Sep 14, 2020  3:55 PM Wynetta Emery, [place] C wrote: Reason for CRM: pt called in to request a call back to discuss what all is needed so that provider is able to complete her forms for insurance?   Please advise/assist.

## 2020-09-15 NOTE — Telephone Encounter (Signed)
Returned call and left a message for her to return call

## 2020-09-29 ENCOUNTER — Ambulatory Visit (INDEPENDENT_AMBULATORY_CARE_PROVIDER_SITE_OTHER): Payer: 59 | Admitting: Family Medicine

## 2020-09-29 ENCOUNTER — Other Ambulatory Visit: Payer: Self-pay

## 2020-09-29 ENCOUNTER — Encounter: Payer: Self-pay | Admitting: Family Medicine

## 2020-09-29 ENCOUNTER — Ambulatory Visit: Payer: 59 | Admitting: Family Medicine

## 2020-09-29 VITALS — BP 130/80 | HR 60 | Ht 63.0 in | Wt 194.0 lb

## 2020-09-29 DIAGNOSIS — R7303 Prediabetes: Secondary | ICD-10-CM

## 2020-09-29 NOTE — Progress Notes (Signed)
Date:  09/29/2020   Name:  Kelsey Terry   DOB:  [birth year]   MRN:  [id number]   Chief Complaint: form filled out (insurance purposes)  Diabetes She presents for her follow-up diabetic visit. Diabetes type: prediabetes. Her disease course has been stable. There are no hypoglycemic associated symptoms. Pertinent negatives for hypoglycemia include no dizziness, headaches or nervousness/anxiousness. There are no diabetic associated symptoms. Pertinent negatives for diabetes include no blurred vision, no chest pain, no fatigue, no foot paresthesias, no foot ulcerations, no polydipsia, no polyphagia, no polyuria, no visual change, no weakness and no weight loss. There are no hypoglycemic complications. Symptoms are stable. There are no diabetic complications. There are no known risk factors for coronary artery disease. Current diabetic treatment includes oral agent (monotherapy). She is compliant with treatment all of the time.    Lab Results  Component Value Date   CREATININE 1.02 (H) 06/27/2020   BUN 13 06/27/2020   NA 142 06/27/2020   K 3.5 06/27/2020   CL 104 06/27/2020   CO2 26 06/27/2020   Lab Results  Component Value Date   CHOL 144 01/20/2020   HDL 50 01/20/2020   LDLCALC 80 01/20/2020   TRIG 68 01/20/2020   CHOLHDL 2.9 01/20/2020   Lab Results  Component Value Date   TSH 1.350 11/20/2018   Lab Results  Component Value Date   HGBA1C 6.0 (H) 05/25/2020   Lab Results  Component Value Date   WBC 5.3 06/27/2020   HGB 14.0 06/27/2020   HCT 44.5 06/27/2020   MCV 84.6 06/27/2020   PLT 210 06/27/2020   Lab Results  Component Value Date   ALT 16 01/20/2020   AST 21 01/20/2020   ALKPHOS 71 01/20/2020   BILITOT 0.6 01/20/2020     Review of Systems  Constitutional: Negative.  Negative for chills, fatigue, fever, unexpected weight change and weight loss.  HENT: Negative for congestion, ear discharge, ear pain, rhinorrhea, sinus pressure, sneezing and sore throat.     Eyes: Negative for blurred vision, photophobia, pain, discharge, redness and itching.  Respiratory: Negative for cough, shortness of breath, wheezing and stridor.   Cardiovascular: Negative for chest pain.  Gastrointestinal: Negative for abdominal pain, blood in stool, constipation, diarrhea, nausea and vomiting.  Endocrine: Negative for cold intolerance, heat intolerance, polydipsia, polyphagia and polyuria.  Genitourinary: Negative for dysuria, flank pain, frequency, hematuria, menstrual problem, pelvic pain, urgency, vaginal bleeding and vaginal discharge.  Musculoskeletal: Negative for arthralgias, back pain and myalgias.  Skin: Negative for rash.  Allergic/Immunologic: Negative for environmental allergies and food allergies.  Neurological: Negative for dizziness, weakness, light-headedness, numbness and headaches.  Hematological: Negative for adenopathy. Does not bruise/bleed easily.  Psychiatric/Behavioral: Negative for dysphoric mood. The patient is not nervous/anxious.     Patient Active Problem List   Diagnosis Date Noted  . Familial hypercholesterolemia 08/23/2020  . Acute bilateral knee pain 01/13/2020  . Health care maintenance 11/24/2019  . H/O elevated lipids 11/04/2019  . Lymphedema 08/26/2019  . Chronic venous insufficiency 08/26/2019  . Encounter for screening colonoscopy   . Polyp of sigmoid colon   . Polyp of ascending colon   . Lymphedema of left leg 06/16/2019  . Prediabetes 06/16/2019  . Abnormal laboratory test result 06/16/2019  . Prediabetes 05/20/2019  . Essential hypertension 05/19/2019  . Abscess of left buttock 05/05/2019  . Essential hypertension 05/05/2019  . CAD (coronary artery disease) 05/05/2019  . NSTEMI (non-ST elevated myocardial infarction) ([place]) 05/16/2018    Allergies  Allergen Reactions  . Amoxicillin Itching and Other (See Comments)    Reaction: bump in vaginal region Has patient had a PCN reaction causing immediate rash,  facial/tongue/throat swelling, SOB or lightheadedness with hypotension: No Has patient had a PCN reaction causing severe rash involving mucus membranes or skin necrosis: No Has patient had a PCN reaction that required hospitalization: No Has patient had a PCN reaction occurring within the last 10 years: No If all of the above answers are "NO", then may proceed with Cephalosporin use.   . Erythromycin Hives and Rash    Past Surgical History:  Procedure Laterality Date  . ABDOMINAL HYSTERECTOMY    . CESAREAN SECTION    . COLONOSCOPY WITH PROPOFOL N/A 08/13/2019   Procedure: COLONOSCOPY WITH BIOPSIES;  Surgeon: Lucilla Lame, MD;  Location: [facility];  Service: Endoscopy;  Laterality: N/A;  . CORONARY STENT INTERVENTION N/A 05/16/2018   Procedure: CORONARY STENT INTERVENTION;  Surgeon: Wellington Hampshire, MD;  Location: [facility];  Service: Cardiovascular;  Laterality: N/A;  . CORONARY STENT INTERVENTION N/A 05/19/2018   Procedure: CORONARY STENT INTERVENTION;  Surgeon: Wellington Hampshire, MD;  Location: [facility];  Service: Cardiovascular;  Laterality: N/A;  . LEFT HEART CATH AND CORONARY ANGIOGRAPHY N/A 05/16/2018   Procedure: LEFT HEART CATH AND CORONARY ANGIOGRAPHY;  Surgeon: Wellington Hampshire, MD;  Location: [facility];  Service: Cardiovascular;  Laterality: N/A;  . PERICARDIOCENTESIS    . POLYPECTOMY N/A 08/13/2019   Procedure: POLYPECTOMY;  Surgeon: Lucilla Lame, MD;  Location: [facility];  Service: Endoscopy;  Laterality: N/A;    Social History   Tobacco Use  . Smoking status: Never Smoker  . Smokeless tobacco: Never Used  Vaping Use  . Vaping Use: Never used  Substance Use Topics  . Alcohol use: Never  . Drug use: Never     Medication list has been reviewed and updated.  Current Meds  Medication Sig  . aspirin EC 81 MG tablet Take 1 tablet (81 mg total) by mouth daily.  . Blood Pressure KIT 1 kit by Does not apply route  daily.  Marland Kitchen CALCIUM CITRATE-VITAMIN D PO Take 1 tablet by mouth 2 (two) times daily.  . chlorthalidone (HYGROTON) 25 MG tablet Take 0.5 tablets (12.5 mg total) by mouth daily.  Marland Kitchen losartan (COZAAR) 100 MG tablet Take 1 tablet (100 mg total) by mouth daily. Patient no longer taking HCTZ  . metFORMIN (GLUCOPHAGE) 500 MG tablet Take 1 tablet (500 mg total) by mouth daily.  . Multiple Vitamin (MULTIVITAMIN) tablet Take 1 tablet by mouth daily.  . potassium chloride (KLOR-CON) 10 MEQ tablet Take 2 tablets (20 mEq total) by mouth daily.  . rosuvastatin (CRESTOR) 40 MG tablet Take 1 tablet (40 mg total) by mouth daily.    PHQ 2/9 Scores 08/23/2020 01/28/2020 08/26/2018 06/09/2018  PHQ - 2 Score 0 0 0 0  PHQ- 9 Score 0 - 0 5    GAD 7 : Generalized Anxiety Score 08/23/2020  Nervous, Anxious, on Edge 0  Control/stop worrying 1  Worry too much - different things 0  Trouble relaxing 0  Restless 0  Easily annoyed or irritable 0  Afraid - awful might happen 0  Total GAD 7 Score 1  Anxiety Difficulty Not difficult at all    BP Readings from Last 3 Encounters:  09/29/20 130/80  08/23/20 120/80  06/30/20 111/68    Physical Exam Vitals and nursing note reviewed.  Cardiovascular:  Pulses:          Dorsalis pedis pulses are 1+ on the right side and 1+ on the left side.       Posterior tibial pulses are 1+ on the right side and 1+ on the left side.  Feet:     Right foot:     Protective Sensation: 10 sites tested. 10 sites sensed.     Skin integrity: No ulcer, blister, skin breakdown, erythema, warmth, callus, dry skin or fissure.     Left foot:     Protective Sensation: 10 sites tested. 10 sites sensed.     Skin integrity: No ulcer, blister, skin breakdown, erythema, warmth, callus, dry skin or fissure.     Wt Readings from Last 3 Encounters:  09/29/20 194 lb (88 kg)  08/23/20 192 lb (87.1 kg)  06/30/20 193 lb (87.5 kg)    BP 130/80   Pulse 60   Ht 5' 3"  (1.6 m)   Wt 194 lb (88 kg)    BMI 34.37 kg/m   Assessment and Plan: Patient's chart was reviewed for previous encounter. 1. Prediabetes Chronic.  Controlled.  Stable.  A1c is 6.0 consistent with prediabetes.  But patient has indeed had her eye exam as well as as well as microalbuminuria.  Foot exam was done today and it was 10/10 sensory with 2+ dorsalis pedis posterior tibial.

## 2020-09-29 NOTE — Patient Instructions (Signed)
Zoster Vaccine, Recombinant injection What is this medicine? ZOSTER VACCINE (ZOS ter vak SEEN) is used to prevent shingles in adults 61 years old and over. This vaccine is not used to treat shingles or nerve pain from shingles. This medicine may be used for other purposes; ask your health care provider or pharmacist if you have questions. COMMON BRAND NAME(S): SHINGRIX What should I tell my health care provider before I take this medicine? They need to know if you have any of these conditions:  blood disorders or disease  cancer like leukemia or lymphoma  immune system problems or therapy  an unusual or allergic reaction to vaccines, other medications, foods, dyes, or preservatives  pregnant or trying to get pregnant  breast-feeding How should I use this medicine? This vaccine is for injection in a muscle. It is given by a health care professional. Talk to your pediatrician regarding the use of this medicine in children. This medicine is not approved for use in children. Overdosage: If you think you have taken too much of this medicine contact a poison control center or emergency room at once. NOTE: This medicine is only for you. Do not share this medicine with others. What if I miss a dose? Keep appointments for follow-up (booster) doses as directed. It is important not to miss your dose. Call your doctor or health care professional if you are unable to keep an appointment. What may interact with this medicine?  medicines that suppress your immune system  medicines to treat cancer  steroid medicines like prednisone or cortisone This list may not describe all possible interactions. Give your health care provider a list of all the medicines, herbs, non-prescription drugs, or dietary supplements you use. Also tell them if you smoke, drink alcohol, or use illegal drugs. Some items may interact with your medicine. What should I watch for while using this medicine? Visit your doctor for  regular check ups. This vaccine, like all vaccines, may not fully protect everyone. What side effects may I notice from receiving this medicine? Side effects that you should report to your doctor or health care professional as soon as possible:  allergic reactions like skin rash, itching or hives, swelling of the face, lips, or tongue  breathing problems Side effects that usually do not require medical attention (report these to your doctor or health care professional if they continue or are bothersome):  chills  headache  fever  nausea, vomiting  redness, warmth, pain, swelling or itching at site where injected  tiredness This list may not describe all possible side effects. Call your doctor for medical advice about side effects. You may report side effects to FDA at 1-800-FDA-1088. Where should I keep my medicine? This vaccine is only given in a clinic, pharmacy, doctor's office, or other health care setting and will not be stored at home. NOTE: This sheet is a summary. It may not cover all possible information. If you have questions about this medicine, talk to your doctor, pharmacist, or health care provider.  2020 Elsevier/Gold Standard (2017-06-17 13:20:30)  

## 2020-10-07 ENCOUNTER — Ambulatory Visit: Payer: Self-pay

## 2020-10-07 NOTE — Telephone Encounter (Signed)
Called pt- she wanted to come in here, but was advised to go to ER for STAT labs, EKG and poss chest xray. Pt voiced understanding

## 2020-10-07 NOTE — Telephone Encounter (Signed)
Pt. Reports she has "right sided chest soreness, not pain." Hurts into her right shoulder as well. Hurts with deep breathing and movement - "when I'm trying to get comfortable in bed." Pain 4/10. Last a few minutes. No shortness of breath, dizziness, nausea or sweating. Pt. Reports "heart attack in 2019." With prior cardiac history, instructed to go to ED. Refuses. "I want to see Dr. Ronnald Ramp because I think I pulled a muscle." Practice currently closed. Please advise pt.  Reason for Disposition . Taking a deep breath makes pain worse  Answer Assessment - Initial Assessment Questions 1. LOCATION: "Where does it hurt?"       Right side and shoulder 2. RADIATION: "Does the pain go anywhere else?" (e.g., into neck, jaw, arms, back)     Shoulder 3. ONSET: "When did the chest pain begin?" (Minutes, hours or days)      2 days 4. PATTERN "Does the pain come and go, or has it been constant since it started?"  "Does it get worse with exertion?"      Comes and goes 5. DURATION: "How long does it last" (e.g., seconds, minutes, hours)     With a deep breath 6. SEVERITY: "How bad is the pain?"  (e.g., Scale 1-10; mild, moderate, or severe)    - MILD (1-3): doesn't interfere with normal activities     - MODERATE (4-7): interferes with normal activities or awakens from sleep    - SEVERE (8-10): excruciating pain, unable to do any normal activities       Soreness with movement -  4 7. CARDIAC RISK FACTORS: "Do you have any history of heart problems or risk factors for heart disease?" (e.g., angina, prior heart attack; diabetes, high blood pressure, high cholesterol, smoker, or strong family history of heart disease)     Yes -2019 8. PULMONARY RISK FACTORS: "Do you have any history of lung disease?"  (e.g., blood clots in lung, asthma, emphysema, birth control pills)     Asthma 9. CAUSE: "What do you think is causing the chest pain?"     Unsure 10. OTHER SYMPTOMS: "Do you have any other symptoms?" (e.g.,  dizziness, nausea, vomiting, sweating, fever, difficulty breathing, cough)       No 11. PREGNANCY: "Is there any chance you are pregnant?" "When was your last menstrual period?"       No  Protocols used: CHEST PAIN-A-AH

## 2020-10-17 ENCOUNTER — Other Ambulatory Visit: Payer: Self-pay

## 2020-10-17 DIAGNOSIS — I10 Essential (primary) hypertension: Secondary | ICD-10-CM

## 2020-10-17 MED ORDER — METFORMIN HCL 500 MG PO TABS: 500.0000 mg | ORAL_TABLET | Freq: Every day | ORAL | 1 refills | Status: DC

## 2020-10-17 MED ORDER — POTASSIUM CHLORIDE ER 10 MEQ PO TBCR: 20.0000 meq | EXTENDED_RELEASE_TABLET | Freq: Every day | ORAL | 1 refills | Status: DC

## 2020-10-17 MED ORDER — LOSARTAN POTASSIUM 100 MG PO TABS: 100.0000 mg | ORAL_TABLET | Freq: Every day | ORAL | 1 refills | Status: DC

## 2020-12-23 ENCOUNTER — Telehealth: Payer: Self-pay

## 2020-12-23 NOTE — Telephone Encounter (Signed)
Copied from [place] [id number]. Topic: General - Other >> Dec 23, 2020  9:25 AM Pawlus, Brayton Layman A wrote: Reason for CRM: PT was returning [facility] call, please contact PT.

## 2020-12-23 NOTE — Telephone Encounter (Signed)
Sent in meds 

## 2020-12-27 ENCOUNTER — Ambulatory Visit (INDEPENDENT_AMBULATORY_CARE_PROVIDER_SITE_OTHER): Payer: 59 | Admitting: Family Medicine

## 2020-12-27 ENCOUNTER — Other Ambulatory Visit: Payer: Self-pay

## 2020-12-27 ENCOUNTER — Encounter: Payer: Self-pay | Admitting: Family Medicine

## 2020-12-27 VITALS — BP 122/80 | HR 60 | Ht 63.0 in | Wt 190.0 lb

## 2020-12-27 DIAGNOSIS — R7303 Prediabetes: Secondary | ICD-10-CM

## 2020-12-27 DIAGNOSIS — E7801 Familial hypercholesterolemia: Secondary | ICD-10-CM | POA: Diagnosis not present

## 2020-12-27 DIAGNOSIS — I2510 Atherosclerotic heart disease of native coronary artery without angina pectoris: Secondary | ICD-10-CM

## 2020-12-27 DIAGNOSIS — I10 Essential (primary) hypertension: Secondary | ICD-10-CM

## 2020-12-27 MED ORDER — METFORMIN HCL 500 MG PO TABS: 500.0000 mg | ORAL_TABLET | Freq: Every day | ORAL | 1 refills | Status: DC

## 2020-12-27 MED ORDER — ROSUVASTATIN CALCIUM 40 MG PO TABS: 40.0000 mg | ORAL_TABLET | Freq: Every day | ORAL | 1 refills | Status: DC

## 2020-12-27 MED ORDER — LOSARTAN POTASSIUM 100 MG PO TABS: 100.0000 mg | ORAL_TABLET | Freq: Every day | ORAL | 1 refills | Status: DC

## 2020-12-27 MED ORDER — POTASSIUM CHLORIDE ER 10 MEQ PO TBCR: 20.0000 meq | EXTENDED_RELEASE_TABLET | Freq: Every day | ORAL | 1 refills | Status: DC

## 2020-12-27 MED ORDER — CHLORTHALIDONE 25 MG PO TABS: 12.5000 mg | ORAL_TABLET | Freq: Every day | ORAL | 1 refills | Status: DC

## 2020-12-27 NOTE — Progress Notes (Signed)
Date:  12/27/2020   Name:  Kelsey Terry   DOB:  02-25-1961   MRN:  [id number]   Chief Complaint: Hyperlipidemia, Hypertension, and Diabetes  Hyperlipidemia This is a chronic problem. The current episode started more than 1 year ago. The problem is controlled. Recent lipid tests were reviewed and are normal. Exacerbating diseases include diabetes. She has no history of chronic renal disease, hypothyroidism, liver disease, obesity or nephrotic syndrome. Pertinent negatives include no chest pain, focal sensory loss, focal weakness, leg pain, myalgias or shortness of breath. Current antihyperlipidemic treatment includes statins. The current treatment provides moderate improvement of lipids. There are no compliance problems.   Hypertension This is a chronic problem. The current episode started more than 1 year ago. The problem has been gradually improving since onset. The problem is controlled. Pertinent negatives include no anxiety, blurred vision, chest pain, headaches, malaise/fatigue, neck pain, orthopnea, palpitations, peripheral edema, PND, shortness of breath or sweats. There are no associated agents to hypertension. Risk factors for coronary artery disease include dyslipidemia. Past treatments include angiotensin blockers and diuretics. The current treatment provides moderate improvement. There are no compliance problems.  There is no history of chronic renal disease.  Diabetes She presents for her follow-up diabetic visit. She has type 2 diabetes mellitus. There are no hypoglycemic associated symptoms. Pertinent negatives for hypoglycemia include no headaches or sweats. Pertinent negatives for diabetes include no blurred vision, no chest pain, no fatigue, no foot paresthesias, no foot ulcerations, no polydipsia, no polyphagia, no polyuria, no visual change, no weakness and no weight loss. There are no hypoglycemic complications. Symptoms are stable. There are no diabetic complications. There are no  known risk factors for coronary artery disease. Current diabetic treatment includes diet and oral agent (monotherapy) (merformen). She is compliant with treatment most of the time. Her weight is decreasing steadily. She is following a generally healthy diet. Meal planning includes avoidance of concentrated sweets and carbohydrate counting. Home blood sugar record trend: not checking.    Lab Results  Component Value Date   CREATININE 1.02 (H) 06/27/2020   BUN 13 06/27/2020   NA 142 06/27/2020   K 3.5 06/27/2020   CL 104 06/27/2020   CO2 26 06/27/2020   Lab Results  Component Value Date   CHOL 144 01/20/2020   HDL 50 01/20/2020   LDLCALC 80 01/20/2020   TRIG 68 01/20/2020   CHOLHDL 2.9 01/20/2020   Lab Results  Component Value Date   TSH 1.350 11/20/2018   Lab Results  Component Value Date   HGBA1C 6.0 (H) 05/25/2020   Lab Results  Component Value Date   WBC 5.3 06/27/2020   HGB 14.0 06/27/2020   HCT 44.5 06/27/2020   MCV 84.6 06/27/2020   PLT 210 06/27/2020   Lab Results  Component Value Date   ALT 16 01/20/2020   AST 21 01/20/2020   ALKPHOS 71 01/20/2020   BILITOT 0.6 01/20/2020     Review of Systems  Constitutional: Negative for fatigue, malaise/fatigue and weight loss.  Eyes: Negative for blurred vision.  Respiratory: Negative for shortness of breath.   Cardiovascular: Negative for chest pain, palpitations, orthopnea and PND.  Endocrine: Negative for polydipsia, polyphagia and polyuria.  Musculoskeletal: Negative for myalgias and neck pain.  Neurological: Negative for focal weakness, weakness and headaches.    Patient Active Problem List   Diagnosis Date Noted  . Familial hypercholesterolemia 08/23/2020  . Acute bilateral knee pain 01/13/2020  . Health care maintenance 11/24/2019  .  H/O elevated lipids 11/04/2019  . Lymphedema 08/26/2019  . Chronic venous insufficiency 08/26/2019  . Encounter for screening colonoscopy   . Polyp of sigmoid colon   .  Polyp of ascending colon   . Lymphedema of left leg 06/16/2019  . Prediabetes 06/16/2019  . Abnormal laboratory test result 06/16/2019  . Prediabetes 05/20/2019  . Essential hypertension 05/19/2019  . Abscess of left buttock 05/05/2019  . Essential hypertension 05/05/2019  . CAD (coronary artery disease) 05/05/2019  . NSTEMI (non-ST elevated myocardial infarction) ([place]) 05/16/2018    Allergies  Allergen Reactions  . Amoxicillin Itching and Other (See Comments)    Reaction: bump in vaginal region Has patient had a PCN reaction causing immediate rash, facial/tongue/throat swelling, SOB or lightheadedness with hypotension: No Has patient had a PCN reaction causing severe rash involving mucus membranes or skin necrosis: No Has patient had a PCN reaction that required hospitalization: No Has patient had a PCN reaction occurring within the last 10 years: No If all of the above answers are "NO", then may proceed with Cephalosporin use.   . Erythromycin Hives and Rash    Past Surgical History:  Procedure Laterality Date  . ABDOMINAL HYSTERECTOMY    . CESAREAN SECTION    . COLONOSCOPY WITH PROPOFOL N/A 08/13/2019   Procedure: COLONOSCOPY WITH BIOPSIES;  Surgeon: Lucilla Lame, MD;  Location: [place];  Service: Endoscopy;  Laterality: N/A;  . CORONARY STENT INTERVENTION N/A 05/16/2018   Procedure: CORONARY STENT INTERVENTION;  Surgeon: Wellington Hampshire, MD;  Location: [facility];  Service: Cardiovascular;  Laterality: N/A;  . CORONARY STENT INTERVENTION N/A 05/19/2018   Procedure: CORONARY STENT INTERVENTION;  Surgeon: Wellington Hampshire, MD;  Location: [facility];  Service: Cardiovascular;  Laterality: N/A;  . LEFT HEART CATH AND CORONARY ANGIOGRAPHY N/A 05/16/2018   Procedure: LEFT HEART CATH AND CORONARY ANGIOGRAPHY;  Surgeon: Wellington Hampshire, MD;  Location: [facility];  Service: Cardiovascular;  Laterality: N/A;  . PERICARDIOCENTESIS    .  POLYPECTOMY N/A 08/13/2019   Procedure: POLYPECTOMY;  Surgeon: Lucilla Lame, MD;  Location: [place];  Service: Endoscopy;  Laterality: N/A;    Social History   Tobacco Use  . Smoking status: Never Smoker  . Smokeless tobacco: Never Used  Vaping Use  . Vaping Use: Never used  Substance Use Topics  . Alcohol use: Never  . Drug use: Never     Medication list has been reviewed and updated.  Current Meds  Medication Sig  . aspirin EC 81 MG tablet Take 1 tablet (81 mg total) by mouth daily.  . Blood Pressure KIT 1 kit by Does not apply route daily.  Marland Kitchen CALCIUM CITRATE-VITAMIN D PO Take 1 tablet by mouth 2 (two) times daily.  . chlorthalidone (HYGROTON) 25 MG tablet Take 0.5 tablets (12.5 mg total) by mouth daily.  Marland Kitchen losartan (COZAAR) 100 MG tablet Take 1 tablet (100 mg total) by mouth daily.  . metFORMIN (GLUCOPHAGE) 500 MG tablet Take 1 tablet (500 mg total) by mouth daily.  . Multiple Vitamin (MULTIVITAMIN) tablet Take 1 tablet by mouth daily.  . potassium chloride (KLOR-CON) 10 MEQ tablet Take 2 tablets (20 mEq total) by mouth daily.  . rosuvastatin (CRESTOR) 40 MG tablet Take 1 tablet (40 mg total) by mouth daily.    PHQ 2/9 Scores 08/23/2020 01/28/2020 08/26/2018 06/09/2018  PHQ - 2 Score 0 0 0 0  PHQ- 9 Score 0 - 0 5  GAD 7 : Generalized Anxiety Score 08/23/2020  Nervous, Anxious, on Edge 0  Control/stop worrying 1  Worry too much - different things 0  Trouble relaxing 0  Restless 0  Easily annoyed or irritable 0  Afraid - awful might happen 0  Total GAD 7 Score 1  Anxiety Difficulty Not difficult at all    BP Readings from Last 3 Encounters:  12/27/20 122/80  09/29/20 130/80  08/23/20 120/80    Physical Exam Vitals and nursing note reviewed.  Constitutional:      General: She is not in acute distress.    Appearance: She is not diaphoretic.  HENT:     Head: Normocephalic and atraumatic.     Right Ear: External ear normal.     Left Ear: External  ear normal.     Nose: Nose normal.     Mouth/Throat:     Mouth: Oropharynx is clear and moist.  Eyes:     General:        Right eye: No discharge.        Left eye: No discharge.     Extraocular Movements: EOM normal.     Conjunctiva/sclera: Conjunctivae normal.     Pupils: Pupils are equal, round, and reactive to light.  Neck:     Thyroid: No thyromegaly.     Vascular: No JVD.  Cardiovascular:     Rate and Rhythm: Normal rate and regular rhythm.     Pulses: Intact distal pulses.     Heart sounds: Normal heart sounds, S1 normal and S2 normal. No murmur heard.  No systolic murmur is present.  No diastolic murmur is present. No friction rub. No gallop. No S3 or S4 sounds.   Pulmonary:     Effort: Pulmonary effort is normal.     Breath sounds: Normal breath sounds. No decreased breath sounds, wheezing, rhonchi or rales.  Abdominal:     General: Bowel sounds are normal.     Palpations: Abdomen is soft. There is no mass.     Tenderness: There is no abdominal tenderness. There is no guarding.  Musculoskeletal:        General: No edema. Normal range of motion.     Cervical back: Normal range of motion and neck supple.  Lymphadenopathy:     Cervical: No cervical adenopathy.  Skin:    General: Skin is warm and dry.  Neurological:     Mental Status: She is alert.     Deep Tendon Reflexes: Reflexes are normal and symmetric.     Wt Readings from Last 3 Encounters:  12/27/20 190 lb (86.2 kg)  09/29/20 194 lb (88 kg)  08/23/20 192 lb (87.1 kg)    BP 122/80   Pulse 60   Ht _0  (1.6 m)   Wt 190 lb (86.2 kg)   BMI 33.66 kg/m   Assessment and Plan:  1. Prediabetes Chronic.  Controlled.  Stable.  Last A1c 7 months ago was 6.0.  Patient is currently on Metformin 500 mg once a day.  We will recheck A1c today along with CMP for GFR and electrolytes. - HgB A1c - Comprehensive Metabolic Panel (CMET)  2. Essential hypertension .  Chronic.  Controlled.  Stable.  Blood pressure is  122/80.  Continue chlorthalidone 25 mg and losartan 100 mg once a day.  Will check CMP for electrolytes and GFR.- chlorthalidone (HYGROTON) 25 MG tablet; Take 0.5 tablets (12.5 mg total) by mouth daily.  Dispense: 45 tablet; Refill: 1 - losartan (  COZAAR) 100 MG tablet; Take 1 tablet (100 mg total) by mouth daily.  Dispense: 90 tablet; Refill: 1 - potassium chloride (KLOR-CON) 10 MEQ tablet; Take 2 tablets (20 mEq total) by mouth daily.  Dispense: 180 tablet; Refill: 1 - Comprehensive Metabolic Panel (CMET)  3. Familial hypercholesterolemia .  Controlled.  Stable.  Patient currently is taking rosuvastatin 40 mg once a day.  And she will continue on this.  Will check lipid panel for current level of control. - Lipid Panel With LDL/HDL Ratio  4. 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  Stable.  No episodes of chest pain.  Patient is status post MI in June 2019.  Patient is done well with no symptomatology of coronary artery disease and we will continue with control of blood pressure, lipidemia, and glucose tolerance. - rosuvastatin (CRESTOR) 40 MG tablet; Take 1 tablet (40 mg total) by mouth daily.  Dispense: 90 tablet; Refill: 1

## 2020-12-29 ENCOUNTER — Encounter: Payer: Self-pay | Admitting: Family Medicine

## 2021-01-20 LAB — COMPREHENSIVE METABOLIC PANEL
ALT: 18 IU/L (ref 0–32)
AST: 20 IU/L (ref 0–40)
Albumin/Globulin Ratio: 1.3 (ref 1.2–2.2)
Albumin: 4.3 g/dL (ref 3.8–4.8)
Alkaline Phosphatase: 58 IU/L (ref 44–121)
BUN/Creatinine Ratio: 15 (ref 12–28)
BUN: 16 mg/dL (ref 8–27)
Bilirubin Total: 0.8 mg/dL (ref 0.0–1.2)
CO2: 24 mmol/L (ref 20–29)
Calcium: 10.1 mg/dL (ref 8.7–10.3)
Chloride: 100 mmol/L (ref 96–106)
Creatinine, Ser: 1.08 mg/dL — ABNORMAL HIGH (ref 0.57–1.00)
Globulin, Total: 3.3 g/dL (ref 1.5–4.5)
Glucose: 75 mg/dL (ref 65–99)
Potassium: 3.7 mmol/L (ref 3.5–5.2)
Sodium: 140 mmol/L (ref 134–144)
Total Protein: 7.6 g/dL (ref 6.0–8.5)
eGFR: 58 mL/min/{1.73_m2} — ABNORMAL LOW (ref >59)

## 2021-01-20 LAB — HEMOGLOBIN A1C
Est. average glucose Bld gHb Est-mCnc: 126 mg/dL
Hgb A1c MFr Bld: 6 % — ABNORMAL HIGH (ref 4.8–5.6)

## 2021-01-20 LAB — LIPID PANEL WITH LDL/HDL RATIO
Cholesterol, Total: 150 mg/dL (ref 100–199)
HDL: 57 mg/dL (ref >39)
LDL Chol Calc (NIH): 79 mg/dL (ref 0–99)
LDL/HDL Ratio: 1.4 ratio (ref 0.0–3.2)
Triglycerides: 71 mg/dL (ref 0–149)
VLDL Cholesterol Cal: 14 mg/dL (ref 5–40)

## 2021-03-01 NOTE — Progress Notes (Deleted)
MRN : [id number]  Kelsey Terry is a 60 y.o. (1961/09/17) female who presents with chief complaint of No chief complaint on file. Marland Kitchen  History of Present Illness:   The patient returns to the office for followup evaluation regarding leg swelling.  The swelling has improved quite a bit and the pain associated with swelling has decreased substantially. There have not been any interval development of a ulcerations or wounds.  Since the previous visit the patient has been wearing graduated compression stockings and has noted little significant improvement in the lymphedema. The patient has been using compression routinely morning until night.  The patient also states elevation during the day and exercise is being done too.  No outpatient medications have been marked as taking for the 03/02/21 encounter (Appointment) with Delana Meyer, Dolores Lory, MD.    Past Medical History:  Diagnosis Date  . Allergies   . Asthma   . Blood dyscrasia    blood clot in right leg  . CAD (coronary artery disease) 05/05/2019  . CAD in native artery    a. NSTEMI 05/16/18: [facility] 05/16/18 - ost-pLAD 95% s/p PCI/DES, mLAD 30%, ostD1 70%, ost-pLCx 30%, mRCA 90%, dRCA 20%; b. staged PCI/DES to [facility] 7/1  . Complication of anesthesia    patient stated she had "some kind of reaction to anesthesia, "thought she was having a stroke" during her second cath procedure.  . Diabetes mellitus without complication (HCC)    pre-diabetes  . Diastolic dysfunction    a. TTE 05/16/18: EF 55-60%, no RWMA, Gr1DD, trivial AI, calcified mitral annulus, mild MR, mildly dilated LA  . Dyspnea    with activity  . Hypertension   . Lymphedema    LLE  . Pericarditis    a. ~ 2012 in [place], [place] s/p pericardiocentesis   . Wears contact lenses     Past Surgical History:  Procedure Laterality Date  . ABDOMINAL HYSTERECTOMY    . CESAREAN SECTION    . COLONOSCOPY WITH PROPOFOL N/A 08/13/2019   Procedure: COLONOSCOPY WITH BIOPSIES;  Surgeon:  Lucilla Lame, MD;  Location: [place];  Service: Endoscopy;  Laterality: N/A;  . CORONARY STENT INTERVENTION N/A 05/16/2018   Procedure: CORONARY STENT INTERVENTION;  Surgeon: Wellington Hampshire, MD;  Location: [place] [facility];  Service: Cardiovascular;  Laterality: N/A;  . CORONARY STENT INTERVENTION N/A 05/19/2018   Procedure: CORONARY STENT INTERVENTION;  Surgeon: Wellington Hampshire, MD;  Location: [place] [facility];  Service: Cardiovascular;  Laterality: N/A;  . LEFT HEART CATH AND CORONARY ANGIOGRAPHY N/A 05/16/2018   Procedure: LEFT HEART CATH AND CORONARY ANGIOGRAPHY;  Surgeon: Wellington Hampshire, MD;  Location: [place] [facility];  Service: Cardiovascular;  Laterality: N/A;  . PERICARDIOCENTESIS    . POLYPECTOMY N/A 08/13/2019   Procedure: POLYPECTOMY;  Surgeon: Lucilla Lame, MD;  Location: [place];  Service: Endoscopy;  Laterality: N/A;    Social History Social History   Tobacco Use  . Smoking status: Never Smoker  . Smokeless tobacco: Never Used  Vaping Use  . Vaping Use: Never used  Substance Use Topics  . Alcohol use: Never  . Drug use: Never    Family History Family History  Problem Relation Age of Onset  . Hypertension Mother   . Pancreatic cancer Father   . Diabetes Brother   . Breast cancer Neg Hx     Allergies  Allergen Reactions  . Amoxicillin Itching and Other (See Comments)    Reaction: bump in  vaginal region Has patient had a PCN reaction causing immediate rash, facial/tongue/throat swelling, SOB or lightheadedness with hypotension: No Has patient had a PCN reaction causing severe rash involving mucus membranes or skin necrosis: No Has patient had a PCN reaction that required hospitalization: No Has patient had a PCN reaction occurring within the last 10 years: No If all of the above answers are "NO", then may proceed with Cephalosporin use.   . Erythromycin Hives and Rash     REVIEW OF SYSTEMS (Negative unless  checked)  Constitutional: [] Weight loss  [] Fever  [] Chills Cardiac: [] Chest pain   [] Chest pressure   [] Palpitations   [] Shortness of breath when laying flat   [] Shortness of breath with exertion. Vascular:  [] Pain in legs with walking   [x] Pain in legs at rest  [] History of DVT   [] Phlebitis   [x] Swelling in legs   [] Varicose veins   [] Non-healing ulcers Pulmonary:   [] Uses home oxygen   [] Productive cough   [] Hemoptysis   [] Wheeze  [] COPD   [] Asthma Neurologic:  [] Dizziness   [] Seizures   [] History of stroke   [] History of TIA  [] Aphasia   [] Vissual changes   [] Weakness or numbness in arm   [] Weakness or numbness in leg Musculoskeletal:   [] Joint swelling   [] Joint pain   [] Low back pain Hematologic:  [] Easy bruising  [] Easy bleeding   [] Hypercoagulable state   [] Anemic Gastrointestinal:  [] Diarrhea   [] Vomiting  [] Gastroesophageal reflux/heartburn   [] Difficulty swallowing. Genitourinary:  [] Chronic kidney disease   [] Difficult urination  [] Frequent urination   [] Blood in urine Skin:  [] Rashes   [] Ulcers  Psychological:  [] History of anxiety   []  History of major depression.  Physical Examination  There were no vitals filed for this visit. There is no height or weight on file to calculate BMI. Gen: WD/WN, NAD Head: [place]/AT, No temporalis wasting.  Ear/Nose/Throat: Hearing grossly intact, nares w/o erythema or drainage Eyes: PER, EOMI, sclera nonicteric.  Neck: Supple, no large masses.   Pulmonary:  Good air movement, no audible wheezing bilaterally, no use of accessory muscles.  Cardiac: RRR, no JVD Vascular: *** Vessel Right Left  Radial Palpable Palpable  Ulnar Palpable Palpable  Brachial Palpable Palpable  Carotid Palpable Palpable  Femoral Palpable Palpable  Popliteal Palpable Palpable  PT Palpable Palpable  DP Palpable Palpable  Gastrointestinal: Non-distended. No guarding/no peritoneal signs.  Musculoskeletal: M/S 5/5 throughout.  No deformity or atrophy.  Neurologic: CN  2-12 intact. Symmetrical.  Speech is fluent. Motor exam as listed above. Psychiatric: Judgment intact, Mood & affect appropriate for pt's clinical situation. Dermatologic: No rashes or ulcers noted.  No changes consistent with cellulitis. Lymph : No lichenification or skin changes of chronic lymphedema.  CBC Lab Results  Component Value Date   WBC 5.3 06/27/2020   HGB 14.0 06/27/2020   HCT 44.5 06/27/2020   MCV 84.6 06/27/2020   PLT 210 06/27/2020    BMET    Component Value Date/Time   NA 140 01/19/2021 1147   K 3.7 01/19/2021 1147   CL 100 01/19/2021 1147   CO2 24 01/19/2021 1147   GLUCOSE 75 01/19/2021 1147   GLUCOSE 77 06/27/2020 1931   BUN 16 01/19/2021 1147   CREATININE 1.08 (H) 01/19/2021 1147   CALCIUM 10.1 01/19/2021 1147   GFRNONAA 60 (L) 06/27/2020 1931   GFRAA >60 06/27/2020 1931   CrCl cannot be calculated (Patient's most recent lab result is older than the maximum 21 days allowed.).  COAG Lab  Results  Component Value Date   INR 0.94 05/16/2018    Radiology No results found.    Assessment/Plan 1. Lymphedema No surgery or intervention at this point in time.    I have reviewed my discussion with the patient regarding venous insufficiency and secondary lymph edema and why it  causes symptoms. I have discussed with the patient the chronic skin changes that accompany these problems and the long term sequela such as ulceration and infection.  Patient will continue wearing graduated compression stockings class 1 (20-30 mmHg) on a daily basis a prescription was given to the patient to keep this updated. The patient will  put the stockings on first thing in the morning and removing them in the evening. The patient is instructed specifically not to sleep in the stockings.  In addition, behavioral modification including elevation during the day will be continued.  Diet and salt restriction was also discussed.  Previous duplex ultrasound of the lower extremities  shows normal deep venous system, superficial reflux was not present.   Following the review of the ultrasound the patient will follow up in 12 months to reassess the degree of swelling and the control that graduated compression is offering.   The patient can be assessed for a Lymph Pump at that time.  However, at this time the patient states they are satisfied with the control compression and elevation is yielding.    2. Chronic venous insufficiency No surgery or intervention at this point in time.    I have reviewed my discussion with the patient regarding venous insufficiency and secondary lymph edema and why it  causes symptoms. I have discussed with the patient the chronic skin changes that accompany these problems and the long term sequela such as ulceration and infection.  Patient will continue wearing graduated compression stockings class 1 (20-30 mmHg) on a daily basis a prescription was given to the patient to keep this updated. The patient will  put the stockings on first thing in the morning and removing them in the evening. The patient is instructed specifically not to sleep in the stockings.  In addition, behavioral modification including elevation during the day will be continued.  Diet and salt restriction was also discussed.  Previous duplex ultrasound of the lower extremities shows normal deep venous system, superficial reflux was not present.   Following the review of the ultrasound the patient will follow up in 12 months to reassess the degree of swelling and the control that graduated compression is offering.   The patient can be assessed for a Lymph Pump at that time.  However, at this time the patient states they are satisfied with the control compression and elevation is yielding.    3. Essential hypertension Continue antihypertensive medications as already ordered, these medications have been reviewed and there are no changes at this time.   4. NSTEMI (non-ST  elevated myocardial infarction) ([place]) Continue cardiac and antihypertensive medications as already ordered and reviewed, no changes at this time.  Continue statin as ordered and reviewed, no changes at this time  Nitrates PRN for chest pain   Hortencia Pilar, MD  03/01/2021 9:40 PM

## 2021-03-02 ENCOUNTER — Ambulatory Visit (INDEPENDENT_AMBULATORY_CARE_PROVIDER_SITE_OTHER): Payer: No Typology Code available for payment source | Admitting: Vascular Surgery

## 2021-03-02 DIAGNOSIS — I2510 Atherosclerotic heart disease of native coronary artery without angina pectoris: Secondary | ICD-10-CM

## 2021-03-02 DIAGNOSIS — I890 Lymphedema, not elsewhere classified: Secondary | ICD-10-CM

## 2021-03-02 DIAGNOSIS — I10 Essential (primary) hypertension: Secondary | ICD-10-CM

## 2021-03-02 DIAGNOSIS — I872 Venous insufficiency (chronic) (peripheral): Secondary | ICD-10-CM

## 2021-03-06 ENCOUNTER — Other Ambulatory Visit: Payer: Self-pay

## 2021-03-06 ENCOUNTER — Encounter (INDEPENDENT_AMBULATORY_CARE_PROVIDER_SITE_OTHER): Payer: Self-pay | Admitting: Vascular Surgery

## 2021-03-06 ENCOUNTER — Ambulatory Visit (INDEPENDENT_AMBULATORY_CARE_PROVIDER_SITE_OTHER): Payer: 59 | Admitting: Vascular Surgery

## 2021-03-06 VITALS — BP 132/81 | HR 64 | Ht 63.0 in | Wt 182.0 lb

## 2021-03-06 DIAGNOSIS — I872 Venous insufficiency (chronic) (peripheral): Secondary | ICD-10-CM

## 2021-03-06 DIAGNOSIS — I2510 Atherosclerotic heart disease of native coronary artery without angina pectoris: Secondary | ICD-10-CM | POA: Diagnosis not present

## 2021-03-06 DIAGNOSIS — E782 Mixed hyperlipidemia: Secondary | ICD-10-CM

## 2021-03-06 DIAGNOSIS — I890 Lymphedema, not elsewhere classified: Secondary | ICD-10-CM | POA: Diagnosis not present

## 2021-03-06 DIAGNOSIS — I10 Essential (primary) hypertension: Secondary | ICD-10-CM | POA: Diagnosis not present

## 2021-03-06 DIAGNOSIS — E785 Hyperlipidemia, unspecified: Secondary | ICD-10-CM | POA: Insufficient documentation

## 2021-03-06 NOTE — Progress Notes (Signed)
MRN : [id number]  Kelsey Terry is a 60 y.o. (Aug 03, 1961) female who presents with chief complaint of  Chief Complaint  Patient presents with  . Follow-up    1 year Follow up  .  History of Present Illness:   The patient returns to the office for followup evaluation regarding leg swelling.  The swelling has persisted and the pain associated with swelling continues. There have not been any interval development of a ulcerations or wounds.  Since the previous visit the patient has been wearing graduated compression stockings and has noted little if any improvement in the lymphedema. The patient has been using compression routinely morning until night.  The patient also states elevation during the day and exercise is being done too.   Current Meds  Medication Sig  . aspirin EC 81 MG tablet Take 1 tablet (81 mg total) by mouth daily.  . Blood Pressure KIT 1 kit by Does not apply route daily.  Marland Kitchen CALCIUM CITRATE-VITAMIN D PO Take 1 tablet by mouth 2 (two) times daily.  . chlorthalidone (HYGROTON) 25 MG tablet Take 0.5 tablets (12.5 mg total) by mouth daily.  Marland Kitchen losartan (COZAAR) 100 MG tablet Take 1 tablet (100 mg total) by mouth daily.  . metFORMIN (GLUCOPHAGE) 500 MG tablet Take 1 tablet (500 mg total) by mouth daily.  . Multiple Vitamin (MULTIVITAMIN) tablet Take 1 tablet by mouth daily.  . potassium chloride (KLOR-CON) 10 MEQ tablet Take 2 tablets (20 mEq total) by mouth daily.  . rosuvastatin (CRESTOR) 40 MG tablet Take 1 tablet (40 mg total) by mouth daily.    Past Medical History:  Diagnosis Date  . Allergies   . Asthma   . Blood dyscrasia    blood clot in right leg  . CAD (coronary artery disease) 05/05/2019  . CAD in native artery    a. NSTEMI 05/16/18: [place] 05/16/18 - ost-pLAD 95% s/p PCI/DES, mLAD 30%, ostD1 70%, ost-pLCx 30%, mRCA 90%, dRCA 20%; b. staged PCI/DES to [facility] 7/1  . Complication of anesthesia    patient stated she had "some kind of reaction to anesthesia,  "thought she was having a stroke" during her second cath procedure.  . Diabetes mellitus without complication (HCC)    pre-diabetes  . Diastolic dysfunction    a. TTE 05/16/18: EF 55-60%, no RWMA, Gr1DD, trivial AI, calcified mitral annulus, mild MR, mildly dilated LA  . Dyspnea    with activity  . Hypertension   . Lymphedema    LLE  . Pericarditis    a. ~ 2012 in [place], [place] s/p pericardiocentesis   . Wears contact lenses     Past Surgical History:  Procedure Laterality Date  . ABDOMINAL HYSTERECTOMY    . CESAREAN SECTION    . COLONOSCOPY WITH PROPOFOL N/A 08/13/2019   Procedure: COLONOSCOPY WITH BIOPSIES;  Surgeon: Lucilla Lame, MD;  Location: [place];  Service: Endoscopy;  Laterality: N/A;  . CORONARY STENT INTERVENTION N/A 05/16/2018   Procedure: CORONARY STENT INTERVENTION;  Surgeon: Wellington Hampshire, MD;  Location: [place] [facility];  Service: Cardiovascular;  Laterality: N/A;  . CORONARY STENT INTERVENTION N/A 05/19/2018   Procedure: CORONARY STENT INTERVENTION;  Surgeon: Wellington Hampshire, MD;  Location: [place] [facility];  Service: Cardiovascular;  Laterality: N/A;  . LEFT HEART CATH AND CORONARY ANGIOGRAPHY N/A 05/16/2018   Procedure: LEFT HEART CATH AND CORONARY ANGIOGRAPHY;  Surgeon: Wellington Hampshire, MD;  Location: [place] [facility];  Service: Cardiovascular;  Laterality:  N/A;  . PERICARDIOCENTESIS    . POLYPECTOMY N/A 08/13/2019   Procedure: POLYPECTOMY;  Surgeon: Lucilla Lame, MD;  Location: [place];  Service: Endoscopy;  Laterality: N/A;    Social History Social History   Tobacco Use  . Smoking status: Never Smoker  . Smokeless tobacco: Never Used  Vaping Use  . Vaping Use: Never used  Substance Use Topics  . Alcohol use: Never  . Drug use: Never    Family History Family History  Problem Relation Age of Onset  . Hypertension Mother   . Pancreatic cancer Father   . Diabetes Brother   . Breast cancer Neg Hx      Allergies  Allergen Reactions  . Amoxicillin Itching and Other (See Comments)    Reaction: bump in vaginal region Has patient had a PCN reaction causing immediate rash, facial/tongue/throat swelling, SOB or lightheadedness with hypotension: No Has patient had a PCN reaction causing severe rash involving mucus membranes or skin necrosis: No Has patient had a PCN reaction that required hospitalization: No Has patient had a PCN reaction occurring within the last 10 years: No If all of the above answers are "NO", then may proceed with Cephalosporin use.   . Erythromycin Hives and Rash     REVIEW OF SYSTEMS (Negative unless checked)  Constitutional: _0 Weight loss  _1 Fever  _2 Chills Cardiac: _3 Chest pain   _4 Chest pressure   _5 Palpitations   _6 Shortness of breath when laying flat   _7 Shortness of breath with exertion. Vascular:  _8 Pain in legs with walking   _9 Pain in legs at rest  _10 History of DVT   _11 Phlebitis   _12 Swelling in legs   _13 Varicose veins   _14 Non-healing ulcers Pulmonary:   _15 Uses home oxygen   _16 Productive cough   _17 Hemoptysis   _18 Wheeze  _19 COPD   _20 Asthma Neurologic:  _21 Dizziness   _22 Seizures   _23 History of stroke   _24 History of TIA  _25 Aphasia   _26 Vissual changes   _27 Weakness or numbness in arm   _28 Weakness or numbness in leg Musculoskeletal:   _29 Joint swelling   _30 Joint pain   _31 Low back pain Hematologic:  _32 Easy bruising  _33 Easy bleeding   _34 Hypercoagulable state   _35 Anemic Gastrointestinal:  _36 Diarrhea   _37 Vomiting  _38 Gastroesophageal reflux/heartburn   _39 Difficulty swallowing. Genitourinary:  _40 Chronic kidney disease   _41 Difficult urination  _42 Frequent urination   _43 Blood in urine Skin:  _44 Rashes   _45 Ulcers  Psychological:  _46 History of anxiety   _47  History of major depression.  Physical Examination  Vitals:   03/06/21 1353  BP: 132/81  Pulse: 64  Weight: 182 lb (82.6 kg)  Height: _48  (1.6 m)   Body mass index is 32.24 kg/m. Gen: WD/WN, NAD Head: [place]/AT,  No temporalis wasting.  Ear/Nose/Throat: Hearing grossly intact, nares w/o erythema or drainage Eyes: PER, EOMI, sclera nonicteric.  Neck: Supple, no large masses.   Pulmonary:  Good air movement, no audible wheezing bilaterally, no use of accessory muscles.  Cardiac: RRR, no JVD Vascular: scattered varicosities present bilaterally.  Mild venous stasis changes to the legs bilaterally.  3-4+ firm pitting edema left more so than the right. Vessel Right Left  Radial Palpable Palpable  PT Palpable Palpable  DP Palpable Palpable  Gastrointestinal: Non-distended. No guarding/no peritoneal signs.  Musculoskeletal: M/S 5/5 throughout.  No deformity or atrophy.  Neurologic: CN 2-12 intact. Symmetrical.  Speech is fluent. Motor exam as listed above. Psychiatric: Judgment intact, Mood & affect appropriate for pt's clinical situation. Dermatologic: Mild venous rashes no ulcers  noted.  No changes consistent with cellulitis. Lymph : + lichenification / skin changes of chronic lymphedema.  CBC Lab Results  Component Value Date   WBC 5.3 06/27/2020   HGB 14.0 06/27/2020   HCT 44.5 06/27/2020   MCV 84.6 06/27/2020   PLT 210 06/27/2020    BMET    Component Value Date/Time   NA 140 01/19/2021 1147   K 3.7 01/19/2021 1147   CL 100 01/19/2021 1147   CO2 24 01/19/2021 1147   GLUCOSE 75 01/19/2021 1147   GLUCOSE 77 06/27/2020 1931   BUN 16 01/19/2021 1147   CREATININE 1.08 (H) 01/19/2021 1147   CALCIUM 10.1 01/19/2021 1147   GFRNONAA 60 (L) 06/27/2020 1931   GFRAA >60 06/27/2020 1931   CrCl cannot be calculated (Patient's most recent lab result is older than the maximum 21 days allowed.).  COAG Lab Results  Component Value Date   INR 0.94 05/16/2018    Radiology No results found.   Assessment/Plan 1. Lymphedema Recommend:  No surgery or intervention at this point in time.    I have reviewed my previous discussion with the patient regarding swelling and why it causes symptoms.   Patient will continue wearing graduated compression stockings class 1 (20-30 mmHg) on a daily basis. The patient will  beginning wearing the stockings first thing in the morning and removing them in the evening. The patient is instructed specifically not to sleep in the stockings.    In addition, behavioral modification including several periods of elevation of the lower extremities during the day will be continued.  This was reviewed with the patient during the initial visit.  The patient will also continue routine exercise, especially walking on a daily basis as was discussed during the initial visit.    Despite conservative treatments including graduated compression therapy class 1 and behavioral modification including exercise and elevation the patient  has not obtained adequate control of the lymphedema.  The patient still has stage 3 lymphedema and therefore, I believe that a lymph pump should be added to improve the control of the patient's lymphedema.  Additionally, a lymph pump is warranted because it will reduce the risk of cellulitis and ulceration in the future.  Patient should follow-up in six months    2. Chronic venous insufficiency Recommend:  No surgery or intervention at this point in time.    I have reviewed my previous discussion with the patient regarding swelling and why it causes symptoms.  Patient will continue wearing graduated compression stockings class 1 (20-30 mmHg) on a daily basis. The patient will  beginning wearing the stockings first thing in the morning and removing them in the evening. The patient is instructed specifically not to sleep in the stockings.    In addition, behavioral modification including several periods of elevation of the lower extremities during the day will be continued.  This was reviewed with the patient during the initial visit.  The patient will also continue routine exercise, especially walking on a daily basis as was discussed during  the initial visit.    Despite conservative treatments including graduated compression therapy class 1 and behavioral modification including exercise and elevation the patient  has not obtained adequate control of the lymphedema.  The patient still has stage 3 lymphedema and therefore, I believe that a lymph pump should be added to improve the control of the patient's lymphedema.  Additionally, a lymph pump is warranted because it will reduce the risk of cellulitis and ulceration in  the future.  Patient should follow-up in six months    3. Essential hypertension Continue antihypertensive medications as already ordered, these medications have been reviewed and there are no changes at this time.   4. Coronary artery disease involving native heart without angina pectoris, unspecified vessel or lesion type Continue cardiac and antihypertensive medications as already ordered and reviewed, no changes at this time.  Continue statin as ordered and reviewed, no changes at this time  Nitrates PRN for chest pain   5. Mixed hyperlipidemia Continue statin as ordered and reviewed, no changes at this time    Hortencia Pilar, MD  03/06/2021 2:04 PM

## 2021-03-07 ENCOUNTER — Encounter (INDEPENDENT_AMBULATORY_CARE_PROVIDER_SITE_OTHER): Payer: Self-pay

## 2021-03-16 ENCOUNTER — Other Ambulatory Visit: Payer: Self-pay

## 2021-03-16 ENCOUNTER — Ambulatory Visit (INDEPENDENT_AMBULATORY_CARE_PROVIDER_SITE_OTHER): Payer: 59 | Admitting: Cardiovascular Disease

## 2021-03-16 ENCOUNTER — Encounter: Payer: Self-pay | Admitting: Cardiovascular Disease

## 2021-03-16 VITALS — BP 136/80 | HR 59 | Ht 63.0 in | Wt 186.2 lb

## 2021-03-16 DIAGNOSIS — I2510 Atherosclerotic heart disease of native coronary artery without angina pectoris: Secondary | ICD-10-CM

## 2021-03-16 DIAGNOSIS — I739 Peripheral vascular disease, unspecified: Secondary | ICD-10-CM

## 2021-03-16 DIAGNOSIS — E785 Hyperlipidemia, unspecified: Secondary | ICD-10-CM

## 2021-03-16 DIAGNOSIS — I10 Essential (primary) hypertension: Secondary | ICD-10-CM

## 2021-03-16 NOTE — Progress Notes (Signed)
Cardiology Office Note   Date:  03/16/2021   ID:  Kelsey Terry, DOB August 12, 1961, MRN [id number]  PCP:  Juline Patch, MD  Cardiologist:   Kathlyn Sacramento, MD   Chief Complaint  Patient presents with  . Follow-up    12 month f/u no complaints today. Meds reviewed verbally with pt.      History of Present Illness: Kelsey Terry is a 60 y.o. female who presents for a follow up visit regarding coronary artery disease and peripheral arterial disease. She had non-ST elevation myocardial infarction in June 2019.  Cardiac catheterization showed severe two-vessel coronary artery disease involving proximal LAD and mid RCA.  Both were treated with PCI and drug-eluting stent placement.  Echocardiogram showed normal LV systolic function. She was noted to have peripheral arterial disease.  ABI was moderately reduced bilaterally with evidence of bilateral SFA occlusion.  She had no claudication and thus was treated medically.  She has been doing well with no recent chest pain, shortness of breath or palpitations.  No leg claudication.  She takes her medications regularly.  She improved her diet and has been walking daily for exercise.  She was able to lose about 14 pounds since last year. She does have chronic lymphedema worse on the right side and is supposed to get a lymphedema pump in the near future.    Past Medical History:  Diagnosis Date  . Allergies   . Asthma   . Blood dyscrasia    blood clot in right leg  . CAD (coronary artery disease) 05/05/2019  . CAD in native artery    a. NSTEMI 05/16/18: [place] 05/16/18 - ost-pLAD 95% s/p PCI/DES, mLAD 30%, ostD1 70%, ost-pLCx 30%, mRCA 90%, dRCA 20%; b. staged PCI/DES to [facility] 7/1  . Complication of anesthesia    patient stated she had "some kind of reaction to anesthesia, "thought she was having a stroke" during her second cath procedure.  . Diabetes mellitus without complication (HCC)    pre-diabetes  . Diastolic dysfunction    a. TTE 05/16/18:  EF 55-60%, no RWMA, Gr1DD, trivial AI, calcified mitral annulus, mild MR, mildly dilated LA  . Dyspnea    with activity  . Hypertension   . Lymphedema    LLE  . Pericarditis    a. ~ 2012 in [place], [place] s/p pericardiocentesis   . Wears contact lenses     Past Surgical History:  Procedure Laterality Date  . ABDOMINAL HYSTERECTOMY    . CESAREAN SECTION    . COLONOSCOPY WITH PROPOFOL N/A 08/13/2019   Procedure: COLONOSCOPY WITH BIOPSIES;  Surgeon: Lucilla Lame, MD;  Location: [place];  Service: Endoscopy;  Laterality: N/A;  . CORONARY STENT INTERVENTION N/A 05/16/2018   Procedure: CORONARY STENT INTERVENTION;  Surgeon: Wellington Hampshire, MD;  Location: [place] [facility];  Service: Cardiovascular;  Laterality: N/A;  . CORONARY STENT INTERVENTION N/A 05/19/2018   Procedure: CORONARY STENT INTERVENTION;  Surgeon: Wellington Hampshire, MD;  Location: [facility] [place] [facility];  Service: Cardiovascular;  Laterality: N/A;  . LEFT HEART CATH AND CORONARY ANGIOGRAPHY N/A 05/16/2018   Procedure: LEFT HEART CATH AND CORONARY ANGIOGRAPHY;  Surgeon: Wellington Hampshire, MD;  Location: [place] [facility];  Service: Cardiovascular;  Laterality: N/A;  . PERICARDIOCENTESIS    . POLYPECTOMY N/A 08/13/2019   Procedure: POLYPECTOMY;  Surgeon: Lucilla Lame, MD;  Location: [place];  Service: Endoscopy;  Laterality: N/A;     Current Outpatient Medications  Medication Sig  Dispense Refill  . aspirin EC 81 MG tablet Take 1 tablet (81 mg total) by mouth daily. 90 tablet 1  . Blood Pressure KIT 1 kit by Does not apply route daily. 1 kit 0  . CALCIUM CITRATE-VITAMIN D PO Take 1 tablet by mouth 2 (two) times daily.    . chlorthalidone (HYGROTON) 25 MG tablet Take 0.5 tablets (12.5 mg total) by mouth daily. 45 tablet 1  . losartan (COZAAR) 100 MG tablet Take 1 tablet (100 mg total) by mouth daily. 90 tablet 1  . metFORMIN (GLUCOPHAGE) 500 MG tablet Take 1 tablet (500 mg total) by mouth daily. 90  tablet 1  . Multiple Vitamin (MULTIVITAMIN) tablet Take 1 tablet by mouth daily.    . potassium chloride (KLOR-CON) 10 MEQ tablet Take 2 tablets (20 mEq total) by mouth daily. 180 tablet 1  . rosuvastatin (CRESTOR) 40 MG tablet Take 1 tablet (40 mg total) by mouth daily. 90 tablet 1   No current facility-administered medications for this visit.    Allergies:   Amoxicillin and Erythromycin    Social History:  The patient  reports that she has never smoked. She has never used smokeless tobacco. She reports that she does not drink alcohol and does not use drugs.   Family History:  The patient's family history includes Diabetes in her brother; Hypertension in her mother; Pancreatic cancer in her father.    ROS:  Please see the history of present illness.   Otherwise, review of systems are positive for none.   All other systems are reviewed and negative.    PHYSICAL EXAM: VS:  BP 136/80 (BP Location: Left Arm, Patient Position: Sitting, Cuff Size: Large)   Pulse (!) 59   Ht _0  (1.6 m)   Wt 186 lb 4 oz (84.5 kg)   SpO2 98%   BMI 32.99 kg/m  , BMI Body mass index is 32.99 kg/m. GEN: Well nourished, well developed, in no acute distress  HEENT: normal  Neck: no JVD, carotid bruits, or masses Cardiac: RRR; no murmurs, rubs, or gallops, chronic lymphedema of the legs worse on the right side Respiratory:  clear to auscultation bilaterally, normal work of breathing GI: soft, nontender, nondistended, + BS MS: no deformity or atrophy  Skin: warm and dry, no rash Neuro:  Strength and sensation are intact Psych: euthymic mood, full affect   EKG:  EKG is ordered today. The ekg ordered today demonstrates normal sinus rhythm with no significant ST or T wave changes.   Recent Labs: 06/27/2020: Hemoglobin 14.0; Platelets 210 01/19/2021: ALT 18; BUN 16; Creatinine, Ser 1.08; Potassium 3.7; Sodium 140    Lipid Panel    Component Value Date/Time   CHOL 150 01/19/2021 1147   TRIG 71  01/19/2021 1147   HDL 57 01/19/2021 1147   CHOLHDL 2.9 01/20/2020 1137   CHOLHDL 4.2 05/16/2018 0009   VLDL 9 05/16/2018 0009   LDLCALC 79 01/19/2021 1147      Wt Readings from Last 3 Encounters:  03/16/21 186 lb 4 oz (84.5 kg)  03/06/21 182 lb (82.6 kg)  12/27/20 190 lb (86.2 kg)        No flowsheet data found.    ASSESSMENT AND PLAN:  1.  Coronary artery disease involving native coronary arteries without angina: She is doing well overall with no anginal symptoms.  Continue medical therapy.   2.  Peripheral arterial disease: Bilateral occlusion of SFA.  No claudication.  Continue medical therapy.  3.  Hyperlipidemia:  Continue high-dose rosuvastatin.  I reviewed most recent lipid profile which showed an LDL of 79.  I discussed with her the option of getting her LDL below 70.  She will probably be able to do that with continued healthy lifestyle changes.  Otherwise, Zetia can be added.  4.  Essential hypertension: Blood pressure is controlled on current medications.  5.  Chronic lymphedema: Managed by Dr. Delana Meyer.   Disposition:   FU with me in 12 months  Signed,  Kathlyn Sacramento, MD  03/16/2021 10:13 AM    [place]

## 2021-03-16 NOTE — Patient Instructions (Signed)

## 2021-04-07 ENCOUNTER — Telehealth: Payer: Self-pay | Admitting: Pharmacy Technician

## 2021-04-07 NOTE — Telephone Encounter (Signed)
Pt has [employer] with prescription drug coverage.  No longer meets [facility]'s eligibility criteria.  [place], [place]  [address]  Apr 07, 2021    Kelsey Terry [address] [place], [place]  [address]  Dear [name]:  This is to inform you that you are no longer eligible to receive medication assistance at [facility].  The reason(s) are:    _____Your total gross monthly household income exceeds 250% of the Federal Poverty Level.   _____Tangible assets (savings, checking, stocks/bonds, pension, retirement, etc.) exceeds our limit  _____You are eligible to receive benefits from [facility], [facility] or HIV Medication            Assistance program _____You are eligible to receive benefits from a Medicare Part "D" plan __X__You have prescription insurance with [place] _____You are not an [facility] resident _____Failure to provide all requested proof of income information for 2022.    We regret that we are unable to help you at this time.  If your prescription coverage is terminated, please contact [facility], so that we may reassess your eligibility for our program.  If you have questions, we may be contacted at [phone].  Thank you,  [facility]

## 2021-04-10 ENCOUNTER — Other Ambulatory Visit: Payer: Self-pay

## 2021-04-14 ENCOUNTER — Telehealth: Payer: Self-pay | Admitting: Cardiovascular Disease

## 2021-04-14 NOTE — Telephone Encounter (Signed)
*  STAT* If patient is at the pharmacy, call can be transferred to refill team.   1. Which medications need to be refilled? (please list name of each medication and dose if known)   Asa  81 mg po q d   2. Which pharmacy/location (including street and city if local pharmacy) is medication to be sent to? [facility]   3. Do they need a 30 day or 90 day supply? [place]

## 2021-04-18 ENCOUNTER — Encounter: Payer: Self-pay | Admitting: Family Medicine

## 2021-04-18 ENCOUNTER — Other Ambulatory Visit: Payer: Self-pay

## 2021-04-18 ENCOUNTER — Ambulatory Visit (INDEPENDENT_AMBULATORY_CARE_PROVIDER_SITE_OTHER): Payer: 59 | Admitting: Family Medicine

## 2021-04-18 VITALS — BP 124/76 | HR 80 | Ht 63.0 in | Wt 183.0 lb

## 2021-04-18 DIAGNOSIS — R7303 Prediabetes: Secondary | ICD-10-CM | POA: Diagnosis not present

## 2021-04-18 DIAGNOSIS — R634 Abnormal weight loss: Secondary | ICD-10-CM | POA: Diagnosis not present

## 2021-04-18 DIAGNOSIS — I2510 Atherosclerotic heart disease of native coronary artery without angina pectoris: Secondary | ICD-10-CM

## 2021-04-18 DIAGNOSIS — I10 Essential (primary) hypertension: Secondary | ICD-10-CM | POA: Diagnosis not present

## 2021-04-18 DIAGNOSIS — E782 Mixed hyperlipidemia: Secondary | ICD-10-CM | POA: Diagnosis not present

## 2021-04-18 MED ORDER — ASPIRIN EC 81 MG PO TBEC: 81.0000 mg | DELAYED_RELEASE_TABLET | Freq: Every day | ORAL | 1 refills | Status: DC

## 2021-04-18 MED ORDER — METFORMIN HCL 500 MG PO TABS: 500.0000 mg | ORAL_TABLET | Freq: Every day | ORAL | 1 refills | Status: DC

## 2021-04-18 NOTE — Telephone Encounter (Signed)
aspirin EC 81 MG tablet 90 tablet 1 04/18/2021    Sig - Route: Take 1 tablet (81 mg total) by mouth daily. - Oral     Associated Diagnoses  Coronary artery disease involving native heart without angina pectoris, unspecified vessel or lesion type      Pharmacy  [facility] ([place], [place]

## 2021-04-18 NOTE — Progress Notes (Signed)
Date:  04/18/2021   Name:  Kelsey Terry   DOB:  11-11-1961   MRN:  [id number]   Chief Complaint: Prediabetes (Does not need blood work)  Diabetes She presents for her follow-up diabetic visit. She has type 2 diabetes mellitus. Her disease course has been stable. There are no hypoglycemic associated symptoms. Pertinent negatives for hypoglycemia include no dizziness, headaches or nervousness/anxiousness. There are no diabetic associated symptoms. Pertinent negatives for diabetes include no fatigue, no polydipsia, no polyphagia, no polyuria and no weakness. There are no hypoglycemic complications. Symptoms are stable. There are no diabetic complications. Current diabetic treatment includes diet and oral agent (monotherapy). She is following a generally healthy diet. Meal planning includes avoidance of concentrated sweets and carbohydrate counting. An ACE inhibitor/angiotensin II receptor blocker is being taken.  Hyperlipidemia This is a chronic problem. The current episode started more than 1 year ago. The problem is controlled. Recent lipid tests were reviewed and are normal. Pertinent negatives include no myalgias or shortness of breath. Current antihyperlipidemic treatment includes statins. The current treatment provides moderate improvement of lipids. There are no compliance problems.   Hypertension This is a chronic problem. The current episode started more than 1 year ago. The problem has been gradually improving since onset. The problem is controlled. Pertinent negatives include no headaches or shortness of breath.    Lab Results  Component Value Date   CREATININE 1.08 (H) 01/19/2021   BUN 16 01/19/2021   NA 140 01/19/2021   K 3.7 01/19/2021   CL 100 01/19/2021   CO2 24 01/19/2021   Lab Results  Component Value Date   CHOL 150 01/19/2021   HDL 57 01/19/2021   LDLCALC 79 01/19/2021   TRIG 71 01/19/2021   CHOLHDL 2.9 01/20/2020   Lab Results  Component Value Date   TSH 1.350  11/20/2018   Lab Results  Component Value Date   HGBA1C 6.0 (H) 01/19/2021   Lab Results  Component Value Date   WBC 5.3 06/27/2020   HGB 14.0 06/27/2020   HCT 44.5 06/27/2020   MCV 84.6 06/27/2020   PLT 210 06/27/2020   Lab Results  Component Value Date   ALT 18 01/19/2021   AST 20 01/19/2021   ALKPHOS 58 01/19/2021   BILITOT 0.8 01/19/2021     Review of Systems  Constitutional: Negative.  Negative for chills, fatigue, fever and unexpected weight change.  HENT: Negative for congestion, ear discharge, ear pain, rhinorrhea, sinus pressure, sneezing and sore throat.   Eyes: Negative for photophobia, pain, discharge, redness and itching.  Respiratory: Negative for cough, shortness of breath, wheezing and stridor.   Gastrointestinal: Negative for abdominal pain, blood in stool, constipation, diarrhea, nausea and vomiting.  Endocrine: Negative for cold intolerance, heat intolerance, polydipsia, polyphagia and polyuria.  Genitourinary: Negative for dysuria, flank pain, frequency, hematuria, menstrual problem, pelvic pain, urgency, vaginal bleeding and vaginal discharge.  Musculoskeletal: Negative for arthralgias, back pain and myalgias.  Skin: Negative for rash.  Allergic/Immunologic: Negative for environmental allergies and food allergies.  Neurological: Negative for dizziness, weakness, light-headedness, numbness and headaches.  Hematological: Negative for adenopathy. Does not bruise/bleed easily.  Psychiatric/Behavioral: Negative for dysphoric mood. The patient is not nervous/anxious.     Patient Active Problem List   Diagnosis Date Noted  . Hyperlipidemia 03/06/2021  . Familial hypercholesterolemia 08/23/2020  . Acute bilateral knee pain 01/13/2020  . Health care maintenance 11/24/2019  . H/O elevated lipids 11/04/2019  . Lymphedema 08/26/2019  . Chronic venous insufficiency  08/26/2019  . Encounter for screening colonoscopy   . Polyp of sigmoid colon   . Polyp of  ascending colon   . Lymphedema of left leg 06/16/2019  . Prediabetes 06/16/2019  . Abnormal laboratory test result 06/16/2019  . Prediabetes 05/20/2019  . Essential hypertension 05/19/2019  . Abscess of left buttock 05/05/2019  . Essential hypertension 05/05/2019  . CAD (coronary artery disease) 05/05/2019  . NSTEMI (non-ST elevated myocardial infarction) ([facility]) 05/16/2018    Allergies  Allergen Reactions  . Amoxicillin Itching and Other (See Comments)    Reaction: bump in vaginal region Has patient had a PCN reaction causing immediate rash, facial/tongue/throat swelling, SOB or lightheadedness with hypotension: No Has patient had a PCN reaction causing severe rash involving mucus membranes or skin necrosis: No Has patient had a PCN reaction that required hospitalization: No Has patient had a PCN reaction occurring within the last 10 years: No If all of the above answers are "NO", then may proceed with Cephalosporin use.   . Erythromycin Hives and Rash    Past Surgical History:  Procedure Laterality Date  . ABDOMINAL HYSTERECTOMY    . CESAREAN SECTION    . COLONOSCOPY WITH PROPOFOL N/A 08/13/2019   Procedure: COLONOSCOPY WITH BIOPSIES;  Surgeon: Lucilla Lame, MD;  Location: [facility];  Service: Endoscopy;  Laterality: N/A;  . CORONARY STENT INTERVENTION N/A 05/16/2018   Procedure: CORONARY STENT INTERVENTION;  Surgeon: Wellington Hampshire, MD;  Location: [facility];  Service: Cardiovascular;  Laterality: N/A;  . CORONARY STENT INTERVENTION N/A 05/19/2018   Procedure: CORONARY STENT INTERVENTION;  Surgeon: Wellington Hampshire, MD;  Location: [place] [facility];  Service: Cardiovascular;  Laterality: N/A;  . LEFT HEART CATH AND CORONARY ANGIOGRAPHY N/A 05/16/2018   Procedure: LEFT HEART CATH AND CORONARY ANGIOGRAPHY;  Surgeon: Wellington Hampshire, MD;  Location: [facility];  Service: Cardiovascular;  Laterality: N/A;  . PERICARDIOCENTESIS    . POLYPECTOMY N/A  08/13/2019   Procedure: POLYPECTOMY;  Surgeon: Lucilla Lame, MD;  Location: [facility];  Service: Endoscopy;  Laterality: N/A;    Social History   Tobacco Use  . Smoking status: Never Smoker  . Smokeless tobacco: Never Used  Vaping Use  . Vaping Use: Never used  Substance Use Topics  . Alcohol use: Never  . Drug use: Never     Medication list has been reviewed and updated.  Current Meds  Medication Sig  . aspirin EC 81 MG tablet Take 1 tablet (81 mg total) by mouth daily.  . Blood Pressure KIT 1 kit by Does not apply route daily.  Marland Kitchen CALCIUM CITRATE-VITAMIN D PO Take 1 tablet by mouth 2 (two) times daily.  . chlorthalidone (HYGROTON) 25 MG tablet Take 0.5 tablets (12.5 mg total) by mouth daily.  Marland Kitchen losartan (COZAAR) 100 MG tablet Take 1 tablet (100 mg total) by mouth daily.  . metFORMIN (GLUCOPHAGE) 500 MG tablet Take 1 tablet (500 mg total) by mouth daily.  . Multiple Vitamin (MULTIVITAMIN) tablet Take 1 tablet by mouth daily.  . potassium chloride (KLOR-CON) 10 MEQ tablet Take 2 tablets (20 mEq total) by mouth daily.  . rosuvastatin (CRESTOR) 40 MG tablet Take 1 tablet (40 mg total) by mouth daily.    PHQ 2/9 Scores 04/18/2021 08/23/2020 01/28/2020 08/26/2018  PHQ - 2 Score 0 0 0 0  PHQ- 9 Score 0 0 - 0    GAD 7 : Generalized Anxiety Score 04/18/2021 08/23/2020  Nervous, Anxious, on Edge  0 0  Control/stop worrying 0 1  Worry too much - different things 0 0  Trouble relaxing 0 0  Restless 0 0  Easily annoyed or irritable 0 0  Afraid - awful might happen 0 0  Total GAD 7 Score 0 1  Anxiety Difficulty - Not difficult at all    BP Readings from Last 3 Encounters:  04/18/21 124/76  03/16/21 136/80  03/06/21 132/81    Physical Exam Vitals reviewed.  Constitutional:      Appearance: She is well-developed.  HENT:     Head: Normocephalic.     Right Ear: Tympanic membrane, ear canal and external ear normal.     Left Ear: Tympanic membrane, ear canal and external  ear normal.     Nose: Nose normal.  Eyes:     General: Lids are everted, no foreign bodies appreciated. No scleral icterus.       Left eye: No foreign body or hordeolum.     Conjunctiva/sclera: Conjunctivae normal.     Right eye: Right conjunctiva is not injected.     Left eye: Left conjunctiva is not injected.     Pupils: Pupils are equal, round, and reactive to light.  Neck:     Thyroid: No thyromegaly.     Vascular: No JVD.     Trachea: No tracheal deviation.  Cardiovascular:     Rate and Rhythm: Normal rate and regular rhythm.     Heart sounds: Normal heart sounds, S1 normal and S2 normal. No murmur heard.  No systolic murmur is present.  No diastolic murmur is present. No friction rub. No gallop. No S3 or S4 sounds.   Pulmonary:     Effort: Pulmonary effort is normal. No respiratory distress.     Breath sounds: Normal breath sounds. No wheezing or rales.  Abdominal:     General: Bowel sounds are normal.     Palpations: Abdomen is soft. There is no mass.     Tenderness: There is no abdominal tenderness. There is no guarding or rebound.  Musculoskeletal:        General: No tenderness. Normal range of motion.     Cervical back: Normal range of motion and neck supple.  Lymphadenopathy:     Cervical: No cervical adenopathy.  Skin:    General: Skin is warm.     Findings: No rash.  Neurological:     Mental Status: She is alert and oriented to person, place, and time.     Cranial Nerves: No cranial nerve deficit.     Deep Tendon Reflexes: Reflexes normal.  Psychiatric:        Mood and Affect: Mood is not anxious or depressed.     Wt Readings from Last 3 Encounters:  04/18/21 183 lb (83 kg)  03/16/21 186 lb 4 oz (84.5 kg)  03/06/21 182 lb (82.6 kg)    BP 124/76   Pulse 80   Ht _0  (1.6 m)   Wt 183 lb (83 kg)   BMI 32.42 kg/m   Assessment and Plan:  1. Prediabetes Chronic.  Controlled.  Stable.  Patient has been doing a good job with her diet and we will hold  on checking her A1c at this time.  She will continue to take her metformin 500 mg  2. Mixed hyperlipidemia Chronic.  Controlled.  Stable.  Chronic.  Controlled.  Stable.  Continue Crestor 40 mg once a day.  3. Essential hypertension Chronic.  Controlled.  Stable.  Blood pressure  today is 124/76.  Continue losartan 100 mg once a day as well as chlorthalidone 25 mg once a day.  4. Weight loss New onset.  Patient said that she has been trying to lose weight by design.

## 2021-04-27 ENCOUNTER — Other Ambulatory Visit: Payer: Self-pay | Admitting: Family Medicine

## 2021-04-27 DIAGNOSIS — Z1231 Encounter for screening mammogram for malignant neoplasm of breast: Secondary | ICD-10-CM

## 2021-05-02 ENCOUNTER — Ambulatory Visit
Admission: RE | Admit: 2021-05-02 | Discharge: 2021-05-02 | Disposition: A | Discharge status: Home / Self Care | Payer: 59 | Source: Ambulatory Visit | Attending: Family Medicine | Admitting: Family Medicine

## 2021-05-02 ENCOUNTER — Other Ambulatory Visit: Payer: Self-pay

## 2021-05-02 DIAGNOSIS — Z1231 Encounter for screening mammogram for malignant neoplasm of breast: Secondary | ICD-10-CM | POA: Insufficient documentation

## 2021-05-09 ENCOUNTER — Other Ambulatory Visit: Payer: Self-pay | Admitting: Internal Medicine

## 2021-05-09 DIAGNOSIS — I10 Essential (primary) hypertension: Secondary | ICD-10-CM

## 2021-06-29 ENCOUNTER — Other Ambulatory Visit: Payer: Self-pay

## 2021-06-29 DIAGNOSIS — I2510 Atherosclerotic heart disease of native coronary artery without angina pectoris: Secondary | ICD-10-CM

## 2021-06-29 DIAGNOSIS — I10 Essential (primary) hypertension: Secondary | ICD-10-CM

## 2021-06-29 MED ORDER — ROSUVASTATIN CALCIUM 40 MG PO TABS: 40.0000 mg | ORAL_TABLET | Freq: Every day | ORAL | 0 refills | Status: DC

## 2021-06-29 MED ORDER — LOSARTAN POTASSIUM 100 MG PO TABS: 100.0000 mg | ORAL_TABLET | Freq: Every day | ORAL | 0 refills | Status: DC

## 2021-06-29 MED ORDER — CHLORTHALIDONE 25 MG PO TABS: 12.5000 mg | ORAL_TABLET | Freq: Every day | ORAL | 0 refills | Status: DC

## 2021-06-29 NOTE — Progress Notes (Signed)
Sent in chlorthalidone, losartan, and rosuvastatin to [facility] per request over fax

## 2021-08-30 ENCOUNTER — Ambulatory Visit (INDEPENDENT_AMBULATORY_CARE_PROVIDER_SITE_OTHER): Payer: 59 | Admitting: Family Medicine

## 2021-08-30 ENCOUNTER — Encounter: Payer: Self-pay | Admitting: Family Medicine

## 2021-08-30 ENCOUNTER — Other Ambulatory Visit: Payer: Self-pay

## 2021-08-30 VITALS — BP 112/80 | HR 72 | Ht 63.0 in | Wt 179.0 lb

## 2021-08-30 DIAGNOSIS — R7303 Prediabetes: Secondary | ICD-10-CM

## 2021-08-30 DIAGNOSIS — I10 Essential (primary) hypertension: Secondary | ICD-10-CM | POA: Diagnosis not present

## 2021-08-30 DIAGNOSIS — I2510 Atherosclerotic heart disease of native coronary artery without angina pectoris: Secondary | ICD-10-CM

## 2021-08-30 MED ORDER — POTASSIUM CHLORIDE ER 10 MEQ PO TBCR: 20.0000 meq | EXTENDED_RELEASE_TABLET | Freq: Every day | ORAL | 1 refills | Status: DC

## 2021-08-30 MED ORDER — LOSARTAN POTASSIUM 100 MG PO TABS: 100.0000 mg | ORAL_TABLET | Freq: Every day | ORAL | 1 refills | Status: DC

## 2021-08-30 MED ORDER — ROSUVASTATIN CALCIUM 40 MG PO TABS: 40.0000 mg | ORAL_TABLET | Freq: Every day | ORAL | 0 refills | Status: DC

## 2021-08-30 MED ORDER — METFORMIN HCL 500 MG PO TABS: 500.0000 mg | ORAL_TABLET | Freq: Every day | ORAL | 1 refills | Status: DC

## 2021-08-30 MED ORDER — CHLORTHALIDONE 25 MG PO TABS: 12.5000 mg | ORAL_TABLET | Freq: Every day | ORAL | 1 refills | Status: DC

## 2021-08-30 NOTE — Progress Notes (Signed)
Date:  08/30/2021   Name:  Kelsey Terry   DOB:  1961/01/08   MRN:  [id number]   Chief Complaint: Hypertension, Hyperlipidemia, and Prediabetes  Hypertension This is a chronic problem. The current episode started more than 1 year ago. The problem has been gradually improving since onset. The problem is controlled. Associated symptoms include peripheral edema. Pertinent negatives include no anxiety, blurred vision, chest pain, headaches, malaise/fatigue, neck pain, palpitations or shortness of breath. There are no associated agents to hypertension. Risk factors for coronary artery disease include dyslipidemia, sedentary lifestyle, diabetes mellitus and obesity. Past treatments include diuretics and angiotensin blockers. The current treatment provides significant improvement. There are no compliance problems.  There is no history of angina, kidney disease, CAD/MI, CVA, heart failure, left ventricular hypertrophy, PVD or retinopathy. There is no history of chronic renal disease, a hypertension causing med or renovascular disease.  Hyperlipidemia This is a chronic problem. The current episode started more than 1 year ago. The problem is controlled. Recent lipid tests were reviewed and are normal. Exacerbating diseases include diabetes and obesity. She has no history of chronic renal disease. Pertinent negatives include no chest pain, focal weakness, leg pain, myalgias or shortness of breath. Current antihyperlipidemic treatment includes statins. The current treatment provides moderate improvement of lipids. There are no compliance problems.  Risk factors for coronary artery disease include diabetes mellitus, dyslipidemia, hypertension and a sedentary lifestyle.  Diabetes She presents for her follow-up diabetic visit. She has type 2 diabetes mellitus. Her disease course has been stable. There are no hypoglycemic associated symptoms. Pertinent negatives for hypoglycemia include no dizziness, headaches or  nervousness/anxiousness. Pertinent negatives for diabetes include no blurred vision, no chest pain, no fatigue, no foot paresthesias, no foot ulcerations, no polydipsia, no polyphagia, no polyuria, no visual change, no weakness and no weight loss. There are no hypoglycemic complications. Symptoms are stable. There are no diabetic complications. Pertinent negatives for diabetic complications include no autonomic neuropathy, CVA, heart disease, impotence, nephropathy, peripheral neuropathy, PVD or retinopathy. Risk factors for coronary artery disease include dyslipidemia and hypertension. Current diabetic treatment includes oral agent (monotherapy). Her weight is stable. She is following a generally healthy diet. An ACE inhibitor/angiotensin II receptor blocker is being taken. She does not see a podiatrist.Eye exam is not current.   Lab Results  Component Value Date   CREATININE 1.08 (H) 01/19/2021   BUN 16 01/19/2021   NA 140 01/19/2021   K 3.7 01/19/2021   CL 100 01/19/2021   CO2 24 01/19/2021   Lab Results  Component Value Date   CHOL 150 01/19/2021   HDL 57 01/19/2021   LDLCALC 79 01/19/2021   TRIG 71 01/19/2021   CHOLHDL 2.9 01/20/2020   Lab Results  Component Value Date   TSH 1.350 11/20/2018   Lab Results  Component Value Date   HGBA1C 6.0 (H) 01/19/2021   Lab Results  Component Value Date   WBC 5.3 06/27/2020   HGB 14.0 06/27/2020   HCT 44.5 06/27/2020   MCV 84.6 06/27/2020   PLT 210 06/27/2020   Lab Results  Component Value Date   ALT 18 01/19/2021   AST 20 01/19/2021   ALKPHOS 58 01/19/2021   BILITOT 0.8 01/19/2021     Review of Systems  Constitutional:  Negative for chills, fatigue, fever, malaise/fatigue and weight loss.  HENT:  Negative for drooling, ear discharge, ear pain and sore throat.   Eyes:  Negative for blurred vision.  Respiratory:  Negative for  cough, shortness of breath and wheezing.   Cardiovascular:  Negative for chest pain, palpitations and  leg swelling.  Gastrointestinal:  Negative for abdominal pain, blood in stool, constipation, diarrhea and nausea.  Endocrine: Negative for polydipsia, polyphagia and polyuria.  Genitourinary:  Negative for dysuria, frequency, hematuria, impotence and urgency.  Musculoskeletal:  Negative for back pain, myalgias and neck pain.  Skin:  Negative for rash.  Allergic/Immunologic: Negative for environmental allergies.  Neurological:  Negative for dizziness, focal weakness, weakness and headaches.  Hematological:  Does not bruise/bleed easily.  Psychiatric/Behavioral:  Negative for suicidal ideas. The patient is not nervous/anxious.    Patient Active Problem List   Diagnosis Date Noted   Hyperlipidemia 03/06/2021   Familial hypercholesterolemia 08/23/2020   Acute bilateral knee pain 01/13/2020   Health care maintenance 11/24/2019   H/O elevated lipids 11/04/2019   Lymphedema 08/26/2019   Chronic venous insufficiency 08/26/2019   Encounter for screening colonoscopy    Polyp of sigmoid colon    Polyp of ascending colon    Lymphedema of left leg 06/16/2019   Prediabetes 06/16/2019   Abnormal laboratory test result 06/16/2019   Prediabetes 05/20/2019   Essential hypertension 05/19/2019   Abscess of left buttock 05/05/2019   Essential hypertension 05/05/2019   CAD (coronary artery disease) 05/05/2019   NSTEMI (non-ST elevated myocardial infarction) ([place] [facility]) 05/16/2018    Allergies  Allergen Reactions   Amoxicillin Itching and Other (See Comments)    Reaction: bump in vaginal region Has patient had a PCN reaction causing immediate rash, facial/tongue/throat swelling, SOB or lightheadedness with hypotension: No Has patient had a PCN reaction causing severe rash involving mucus membranes or skin necrosis: No Has patient had a PCN reaction that required hospitalization: No Has patient had a PCN reaction occurring within the last 10 years: No If all of the above answers are "NO", then may  proceed with Cephalosporin use.    Erythromycin Hives and Rash    Past Surgical History:  Procedure Laterality Date   ABDOMINAL HYSTERECTOMY     CESAREAN SECTION     COLONOSCOPY WITH PROPOFOL N/A 08/13/2019   Procedure: COLONOSCOPY WITH BIOPSIES;  Surgeon: Lucilla Lame, MD;  Location: [facility];  Service: Endoscopy;  Laterality: N/A;   CORONARY STENT INTERVENTION N/A 05/16/2018   Procedure: CORONARY STENT INTERVENTION;  Surgeon: Wellington Hampshire, MD;  Location: [facility];  Service: Cardiovascular;  Laterality: N/A;   CORONARY STENT INTERVENTION N/A 05/19/2018   Procedure: CORONARY STENT INTERVENTION;  Surgeon: Wellington Hampshire, MD;  Location: [facility];  Service: Cardiovascular;  Laterality: N/A;   LEFT HEART CATH AND CORONARY ANGIOGRAPHY N/A 05/16/2018   Procedure: LEFT HEART CATH AND CORONARY ANGIOGRAPHY;  Surgeon: Wellington Hampshire, MD;  Location: [facility];  Service: Cardiovascular;  Laterality: N/A;   PERICARDIOCENTESIS     POLYPECTOMY N/A 08/13/2019   Procedure: POLYPECTOMY;  Surgeon: Lucilla Lame, MD;  Location: [facility];  Service: Endoscopy;  Laterality: N/A;    Social History   Tobacco Use   Smoking status: Never   Smokeless tobacco: Never  Vaping Use   Vaping Use: Never used  Substance Use Topics   Alcohol use: Never   Drug use: Never     Medication list has been reviewed and updated.  Current Meds  Medication Sig   aspirin EC 81 MG tablet Take 1 tablet (81 mg total) by mouth daily.   Blood Pressure KIT 1 kit by Does not apply route daily.  CALCIUM CITRATE-VITAMIN D PO Take 1 tablet by mouth 2 (two) times daily.   chlorthalidone (HYGROTON) 25 MG tablet Take 0.5 tablets (12.5 mg total) by mouth daily.   losartan (COZAAR) 100 MG tablet Take 1 tablet (100 mg total) by mouth daily.   metFORMIN (GLUCOPHAGE) 500 MG tablet Take 1 tablet (500 mg total) by mouth daily.   Multiple Vitamin (MULTIVITAMIN) tablet Take 1  tablet by mouth daily.   potassium chloride (KLOR-CON) 10 MEQ tablet TAKE 2 TABLETS BY MOUTH DAILY   rosuvastatin (CRESTOR) 40 MG tablet Take 1 tablet (40 mg total) by mouth daily.    PHQ 2/9 Scores 04/18/2021 08/23/2020 01/28/2020 08/26/2018  PHQ - 2 Score 0 0 0 0  PHQ- 9 Score 0 0 - 0    GAD 7 : Generalized Anxiety Score 04/18/2021 08/23/2020  Nervous, Anxious, on Edge 0 0  Control/stop worrying 0 1  Worry too much - different things 0 0  Trouble relaxing 0 0  Restless 0 0  Easily annoyed or irritable 0 0  Afraid - awful might happen 0 0  Total GAD 7 Score 0 1  Anxiety Difficulty - Not difficult at all    BP Readings from Last 3 Encounters:  08/30/21 112/80  04/18/21 124/76  03/16/21 136/80    Physical Exam Vitals and nursing note reviewed.  Constitutional:      Appearance: She is well-developed.  HENT:     Head: Normocephalic.     Right Ear: Tympanic membrane, ear canal and external ear normal.     Left Ear: Tympanic membrane, ear canal and external ear normal.     Nose: Nose normal.     Mouth/Throat:     Mouth: Mucous membranes are moist.     Pharynx: Oropharynx is clear.  Eyes:     General: Lids are everted, no foreign bodies appreciated. No scleral icterus.       Left eye: No foreign body or hordeolum.     Conjunctiva/sclera: Conjunctivae normal.     Right eye: Right conjunctiva is not injected.     Left eye: Left conjunctiva is not injected.     Pupils: Pupils are equal, round, and reactive to light.  Neck:     Thyroid: No thyromegaly.     Vascular: No JVD.     Trachea: No tracheal deviation.  Cardiovascular:     Rate and Rhythm: Normal rate and regular rhythm.     Heart sounds: Normal heart sounds. No murmur heard.   No friction rub. No gallop.  Pulmonary:     Effort: Pulmonary effort is normal. No respiratory distress.     Breath sounds: Normal breath sounds. No wheezing, rhonchi or rales.  Abdominal:     General: Bowel sounds are normal.     Palpations:  Abdomen is soft. There is no mass.     Tenderness: There is no abdominal tenderness. There is no guarding or rebound.  Musculoskeletal:        General: No tenderness. Normal range of motion.     Cervical back: Normal range of motion and neck supple.  Lymphadenopathy:     Cervical: No cervical adenopathy.  Skin:    General: Skin is warm.     Findings: No rash.  Neurological:     Mental Status: She is alert and oriented to person, place, and time.     Cranial Nerves: No cranial nerve deficit.     Deep Tendon Reflexes: Reflexes normal.  Psychiatric:  Mood and Affect: Mood is not anxious or depressed.    Wt Readings from Last 3 Encounters:  08/30/21 179 lb (81.2 kg)  04/18/21 183 lb (83 kg)  03/16/21 186 lb 4 oz (84.5 kg)    BP 112/80   Pulse 72   Ht _0  (1.6 m)   Wt 179 lb (81.2 kg)   BMI 31.71 kg/m   Assessment and Plan:  1. Essential hypertension Chronic.  Controlled.  Stable.  Blood pressure today is 112/80.  Continue chlorthalidone 25 mg, losartan 100 mg, with potassium supplementation.  We will recheck renal function panel for electrolytes and GFR and recheck patient in 6 months. - chlorthalidone (HYGROTON) 25 MG tablet; Take 0.5 tablets (12.5 mg total) by mouth daily.  Dispense: 45 tablet; Refill: 1 - losartan (COZAAR) 100 MG tablet; Take 1 tablet (100 mg total) by mouth daily.  Dispense: 90 tablet; Refill: 1 - potassium chloride (KLOR-CON) 10 MEQ tablet; Take 2 tablets (20 mEq total) by mouth daily.  Dispense: 180 tablet; Refill: 1 - Renal Function Panel  2. Prediabetes Chronic.  Controlled.  Stable.  Continue metformin 500 mg once a day.  Will check A1c and renal function panel today along with microalbuminuria and and will address accordingly. - metFORMIN (GLUCOPHAGE) 500 MG tablet; Take 1 tablet (500 mg total) by mouth daily.  Dispense: 90 tablet; Refill: 1 - Renal Function Panel - HgB A1c - Microalbumin, urine  3. Coronary artery disease involving native  heart without angina pectoris, unspecified vessel or lesion type Chronic.  Controlled.  Stable.  Continue rosuvastatin 40 mg once a day. - rosuvastatin (CRESTOR) 40 MG tablet; Take 1 tablet (40 mg total) by mouth daily.  Dispense: 90 tablet; Refill: 0

## 2021-08-31 LAB — RENAL FUNCTION PANEL
Albumin: 4.6 g/dL (ref 3.8–4.8)
BUN/Creatinine Ratio: 14 (ref 12–28)
BUN: 14 mg/dL (ref 8–27)
CO2: 27 mmol/L (ref 20–29)
Calcium: 10.4 mg/dL — ABNORMAL HIGH (ref 8.7–10.3)
Chloride: 97 mmol/L (ref 96–106)
Creatinine, Ser: 1 mg/dL (ref 0.57–1.00)
Glucose: 76 mg/dL (ref 70–99)
Phosphorus: 4 mg/dL (ref 3.0–4.3)
Potassium: 3.5 mmol/L (ref 3.5–5.2)
Sodium: 143 mmol/L (ref 134–144)
eGFR: 64 mL/min/{1.73_m2} (ref >59)

## 2021-08-31 LAB — HEMOGLOBIN A1C
Est. average glucose Bld gHb Est-mCnc: 126 mg/dL
Hgb A1c MFr Bld: 6 % — ABNORMAL HIGH (ref 4.8–5.6)

## 2021-08-31 LAB — MICROALBUMIN, URINE: Microalbumin, Urine: 31.5 ug/mL

## 2021-09-07 ENCOUNTER — Ambulatory Visit (INDEPENDENT_AMBULATORY_CARE_PROVIDER_SITE_OTHER): Payer: 59 | Admitting: Vascular Surgery

## 2021-09-14 ENCOUNTER — Ambulatory Visit (INDEPENDENT_AMBULATORY_CARE_PROVIDER_SITE_OTHER): Payer: 59 | Admitting: Vascular Surgery

## 2021-09-18 ENCOUNTER — Encounter (INDEPENDENT_AMBULATORY_CARE_PROVIDER_SITE_OTHER): Payer: Self-pay | Admitting: Vascular Surgery

## 2021-09-18 ENCOUNTER — Ambulatory Visit (INDEPENDENT_AMBULATORY_CARE_PROVIDER_SITE_OTHER): Payer: 59 | Admitting: Vascular Surgery

## 2021-09-18 ENCOUNTER — Other Ambulatory Visit: Payer: Self-pay

## 2021-09-18 VITALS — BP 108/71 | HR 71 | Ht 63.0 in | Wt 180.0 lb

## 2021-09-18 DIAGNOSIS — I10 Essential (primary) hypertension: Secondary | ICD-10-CM | POA: Diagnosis not present

## 2021-09-18 DIAGNOSIS — I890 Lymphedema, not elsewhere classified: Secondary | ICD-10-CM | POA: Diagnosis not present

## 2021-09-18 DIAGNOSIS — E782 Mixed hyperlipidemia: Secondary | ICD-10-CM

## 2021-09-18 DIAGNOSIS — I2510 Atherosclerotic heart disease of native coronary artery without angina pectoris: Secondary | ICD-10-CM | POA: Diagnosis not present

## 2021-09-18 DIAGNOSIS — I872 Venous insufficiency (chronic) (peripheral): Secondary | ICD-10-CM | POA: Diagnosis not present

## 2021-09-18 NOTE — Progress Notes (Signed)
MRN : [id number]  Kelsey Terry is a 60 y.o. (08/15/1961) female who presents with chief complaint of check up for swelling.  History of Present Illness:   The patient returns to the office for followup evaluation regarding leg swelling.  The swelling has persisted but with the lymph pump is much better controlled. The pain associated with swelling is essentially eliminated. There have not been any interval development of a ulcerations or wounds.  She states she uses the pump atleast once every day.  The patient denies problems with the pump, noting it is working well and the leggings are in good condition.  Since the previous visit the patient has been wearing graduated compression stockings and using the lymph pump on a routine basis and  has noted significant improvement in the lymphedema.   Patient stated the lymph pump has been a very positive factor in her care.    Current Meds  Medication Sig   aspirin EC 81 MG tablet Take 1 tablet (81 mg total) by mouth daily.   Blood Pressure KIT 1 kit by Does not apply route daily.   CALCIUM CITRATE-VITAMIN D PO Take 1 tablet by mouth 2 (two) times daily.   chlorthalidone (HYGROTON) 25 MG tablet Take 0.5 tablets (12.5 mg total) by mouth daily.   losartan (COZAAR) 100 MG tablet Take 1 tablet (100 mg total) by mouth daily.   metFORMIN (GLUCOPHAGE) 500 MG tablet Take 1 tablet (500 mg total) by mouth daily.   Multiple Vitamin (MULTIVITAMIN) tablet Take 1 tablet by mouth daily.   potassium chloride (KLOR-CON) 10 MEQ tablet Take 2 tablets (20 mEq total) by mouth daily.   rosuvastatin (CRESTOR) 40 MG tablet Take 1 tablet (40 mg total) by mouth daily.    Past Medical History:  Diagnosis Date   Allergies    Asthma    Blood dyscrasia    blood clot in right leg   CAD (coronary artery disease) 05/05/2019   CAD in native artery    a. NSTEMI 05/16/18: LHC 05/16/18 - ost-pLAD 95% s/p PCI/DES, mLAD 30%, ostD1 70%, ost-pLCx 30%, mRCA 90%, dRCA 20%; b.  staged PCI/DES to [facility] 7/1   Complication of anesthesia    patient stated she had "some kind of reaction to anesthesia, "thought she was having a stroke" during her second cath procedure.   Diabetes mellitus without complication ([place])    pre-diabetes   Diastolic dysfunction    a. TTE 05/16/18: EF 55-60%, no RWMA, Gr1DD, trivial AI, calcified mitral annulus, mild MR, mildly dilated LA   Dyspnea    with activity   Hypertension    Lymphedema    LLE   Pericarditis    a. ~ 2012 in [place], [place] s/p pericardiocentesis    Wears contact lenses     Past Surgical History:  Procedure Laterality Date   ABDOMINAL HYSTERECTOMY     CESAREAN SECTION     COLONOSCOPY WITH PROPOFOL N/A 08/13/2019   Procedure: COLONOSCOPY WITH BIOPSIES;  Surgeon: Lucilla Lame, MD;  Location: [place];  Service: Endoscopy;  Laterality: N/A;   CORONARY STENT INTERVENTION N/A 05/16/2018   Procedure: CORONARY STENT INTERVENTION;  Surgeon: Wellington Hampshire, MD;  Location: [place] [facility];  Service: Cardiovascular;  Laterality: N/A;   CORONARY STENT INTERVENTION N/A 05/19/2018   Procedure: CORONARY STENT INTERVENTION;  Surgeon: Wellington Hampshire, MD;  Location: [place] [facility];  Service: Cardiovascular;  Laterality: N/A;   LEFT HEART CATH AND CORONARY ANGIOGRAPHY N/A 05/16/2018  Procedure: LEFT HEART CATH AND CORONARY ANGIOGRAPHY;  Surgeon: Wellington Hampshire, MD;  Location: [place] [facility];  Service: Cardiovascular;  Laterality: N/A;   PERICARDIOCENTESIS     POLYPECTOMY N/A 08/13/2019   Procedure: POLYPECTOMY;  Surgeon: Lucilla Lame, MD;  Location: [place];  Service: Endoscopy;  Laterality: N/A;    Social History Social History   Tobacco Use   Smoking status: Never   Smokeless tobacco: Never  Vaping Use   Vaping Use: Never used  Substance Use Topics   Alcohol use: Never   Drug use: Never    Family History Family History  Problem Relation Age of Onset   Hypertension Mother     Pancreatic cancer Father    Diabetes Brother    Breast cancer Neg Hx     Allergies  Allergen Reactions   Amoxicillin Itching and Other (See Comments)    Reaction: bump in vaginal region Has patient had a PCN reaction causing immediate rash, facial/tongue/throat swelling, SOB or lightheadedness with hypotension: No Has patient had a PCN reaction causing severe rash involving mucus membranes or skin necrosis: No Has patient had a PCN reaction that required hospitalization: No Has patient had a PCN reaction occurring within the last 10 years: No If all of the above answers are "NO", then may proceed with Cephalosporin use.    Erythromycin Hives and Rash     REVIEW OF SYSTEMS (Negative unless checked)  Constitutional: [] Weight loss  [] Fever  [] Chills Cardiac: [] Chest pain   [] Chest pressure   [] Palpitations   [] Shortness of breath when laying flat   [] Shortness of breath with exertion. Vascular:  [] Pain in legs with walking   [] Pain in legs at rest  [] History of DVT   [] Phlebitis   [x] Swelling in legs   [] Varicose veins   [] Non-healing ulcers Pulmonary:   [] Uses home oxygen   [] Productive cough   [] Hemoptysis   [] Wheeze  [] COPD   [] Asthma Neurologic:  [] Dizziness   [] Seizures   [] History of stroke   [] History of TIA  [] Aphasia   [] Vissual changes   [] Weakness or numbness in arm   [] Weakness or numbness in leg Musculoskeletal:   [] Joint swelling   [] Joint pain   [] Low back pain Hematologic:  [] Easy bruising  [] Easy bleeding   [] Hypercoagulable state   [] Anemic Gastrointestinal:  [] Diarrhea   [] Vomiting  [] Gastroesophageal reflux/heartburn   [] Difficulty swallowing. Genitourinary:  [] Chronic kidney disease   [] Difficult urination  [] Frequent urination   [] Blood in urine Skin:  [] Rashes   [] Ulcers  Psychological:  [] History of anxiety   []  History of major depression.  Physical Examination  Vitals:   09/18/21 1052  BP: 108/71  Pulse: 71  Weight: 180 lb (81.6 kg)  Height: 5' 3"  (1.6  m)   Body mass index is 31.89 kg/m. Gen: WD/WN, NAD Head: [place]/AT, No temporalis wasting.  Ear/Nose/Throat: Hearing grossly intact, nares w/o erythema or drainage, pinna without lesions Eyes: PER, EOMI, sclera nonicteric.  Neck: Supple, no gross masses.  No JVD.  Pulmonary:  Good air movement, no audible wheezing, no use of accessory muscles.  Cardiac: RRR, precordium not hyperdynamic. Vascular:  scattered varicosities present bilaterally.  Mild venous stasis changes to the legs bilaterally.  2-3+ soft pitting edema  L > R Vessel Right Left  Radial Palpable Palpable  Gastrointestinal: soft, non-distended. No guarding/no peritoneal signs.  Musculoskeletal: M/S 5/5 throughout.  No deformity.  Neurologic: CN 2-12 intact. Pain and light touch intact in extremities.  Symmetrical.  Speech is fluent. Motor exam as listed above. Psychiatric: Judgment intact, Mood & affect appropriate for pt's clinical situation. Dermatologic: Venous rashes no ulcers noted.  No changes consistent with cellulitis. Lymph : No lichenification or skin changes of chronic lymphedema.  CBC Lab Results  Component Value Date   WBC 5.3 06/27/2020   HGB 14.0 06/27/2020   HCT 44.5 06/27/2020   MCV 84.6 06/27/2020   PLT 210 06/27/2020    BMET    Component Value Date/Time   NA 143 08/30/2021 1134   K 3.5 08/30/2021 1134   CL 97 08/30/2021 1134   CO2 27 08/30/2021 1134   GLUCOSE 76 08/30/2021 1134   GLUCOSE 77 06/27/2020 1931   BUN 14 08/30/2021 1134   CREATININE 1.00 08/30/2021 1134   CALCIUM 10.4 (H) 08/30/2021 1134   GFRNONAA 60 (L) 06/27/2020 1931   GFRAA >60 06/27/2020 1931   Estimated Creatinine Clearance: 59 mL/min (by C-G formula based on SCr of 1 mg/dL).  COAG Lab Results  Component Value Date   INR 0.94 05/16/2018    Radiology No results found.   Assessment/Plan 1. Lymphedema  No surgery or intervention at this point in time.    I have reviewed my discussion with the patient regarding  lymphedema and why it  causes symptoms.  Patient will continue wearing graduated compression stockings class 1 (20-30 mmHg) on a daily basis a prescription was given. The patient is reminded to put the stockings on first thing in the morning and removing them in the evening. The patient is instructed specifically not to sleep in the stockings.   In addition, behavioral modification throughout the day will be continued.  This will include frequent elevation (such as in a recliner), use of over the counter pain medications as needed and exercise such as walking.  I have reviewed systemic causes for chronic edema such as liver, kidney and cardiac etiologies and there does not appear to be any significant changes in these organ systems over the past year.  The patient is under the impression that these organ systems are all stable and unchanged.    The patient will continue aggressive use of the  lymph pump.  This will continue to improve the edema control and prevent sequela such as ulcers and infections.   The patient will follow-up with me on an annual basis.    2. Chronic venous insufficiency  No surgery or intervention at this point in time.    I have reviewed my discussion with the patient regarding lymphedema and why it  causes symptoms.  Patient will continue wearing graduated compression stockings class 1 (20-30 mmHg) on a daily basis a prescription was given. The patient is reminded to put the stockings on first thing in the morning and removing them in the evening. The patient is instructed specifically not to sleep in the stockings.   In addition, behavioral modification throughout the day will be continued.  This will include frequent elevation (such as in a recliner), use of over the counter pain medications as needed and exercise such as walking.  I have reviewed systemic causes for chronic edema such as liver, kidney and cardiac etiologies and there does not appear to be any significant  changes in these organ systems over the past year.  The patient is under the impression that these organ systems are all stable and unchanged.    The patient will continue aggressive use of the  lymph pump.  This will continue to improve the  edema control and prevent sequela such as ulcers and infections.   The patient will follow-up with me on an annual basis.    3. Essential hypertension Continue antihypertensive medications as already ordered, these medications have been reviewed and there are no changes at this time.   4. Coronary artery disease involving native heart without angina pectoris, unspecified vessel or lesion type Continue cardiac and antihypertensive medications as already ordered and reviewed, no changes at this time.  Continue statin as ordered and reviewed, no changes at this time  Nitrates PRN for chest pain   5. Mixed hyperlipidemia Continue statin as ordered and reviewed, no changes at this time     Hortencia Pilar, MD  09/18/2021 10:58 AM

## 2021-09-19 ENCOUNTER — Other Ambulatory Visit: Payer: Self-pay | Admitting: Cardiovascular Disease

## 2021-09-19 DIAGNOSIS — I2510 Atherosclerotic heart disease of native coronary artery without angina pectoris: Secondary | ICD-10-CM

## 2021-12-05 ENCOUNTER — Telehealth: Payer: Self-pay

## 2021-12-05 ENCOUNTER — Other Ambulatory Visit: Payer: Self-pay | Admitting: Family Medicine

## 2021-12-05 DIAGNOSIS — I10 Essential (primary) hypertension: Secondary | ICD-10-CM

## 2021-12-05 DIAGNOSIS — R7303 Prediabetes: Secondary | ICD-10-CM

## 2021-12-05 DIAGNOSIS — I2510 Atherosclerotic heart disease of native coronary artery without angina pectoris: Secondary | ICD-10-CM

## 2021-12-05 MED ORDER — METFORMIN HCL 500 MG PO TABS: 500.0000 mg | ORAL_TABLET | Freq: Every day | ORAL | 0 refills | Status: DC

## 2021-12-05 MED ORDER — LOSARTAN POTASSIUM 100 MG PO TABS: 100.0000 mg | ORAL_TABLET | Freq: Every day | ORAL | 0 refills | Status: DC

## 2021-12-05 MED ORDER — POTASSIUM CHLORIDE ER 10 MEQ PO TBCR: 20.0000 meq | EXTENDED_RELEASE_TABLET | Freq: Every day | ORAL | 0 refills | Status: DC

## 2021-12-05 MED ORDER — ROSUVASTATIN CALCIUM 40 MG PO TABS
40.0000 mg | ORAL_TABLET | Freq: Every day | ORAL | 0 refills | Status: DC
Start: 2021-12-05 — End: 2022-03-01

## 2021-12-05 MED ORDER — CHLORTHALIDONE 25 MG PO TABS: 12.5000 mg | ORAL_TABLET | Freq: Every day | ORAL | 0 refills | Status: DC

## 2021-12-05 NOTE — Telephone Encounter (Signed)
Copied from [place] [id number]. Topic: General - Other >> Dec 05, 2021  2:34 PM Greggory Keen D wrote: Reason for CRM: Pt called saying her insurance has changed to [employer] and they would be calling the office regarding her prescriptions.

## 2021-12-05 NOTE — Telephone Encounter (Signed)
Medication Refill - Medication: losartan (COZAAR) 100 MG tablet,metFORMIN (GLUCOPHAGE) 500 MG tablet,rosuvastatin (CRESTOR) 40 MG tablet ,potassium chloride (KLOR-CON) 10 MEQ tablet,chlorthalidone (HYGROTON) 25 MG tablet  Fuller Song, from pharmacy, stated, "Please make sure supervising MD information is on the request as it's a law in the state of [place]."  Has the patient contacted their pharmacy? No. Pharmacy calling requesting medication transfer.   (Agent: If no, request that the patient contact the pharmacy for the refill. If patient does not wish to contact the pharmacy document the reason why and proceed with request.)   Preferred Pharmacy (with phone number or street name):  [place] [place] [address]  Phone: [phone] Fax: [phone]  Hours: Not open 24 hours    Has the patient been seen for an appointment in the last year OR does the patient have an upcoming appointment? Yes.    Agent: Please be advised that RX refills may take up to 3 business days. We ask that you follow-up with your pharmacy.

## 2022-03-01 ENCOUNTER — Other Ambulatory Visit: Payer: Self-pay | Admitting: Family Medicine

## 2022-03-01 ENCOUNTER — Ambulatory Visit (INDEPENDENT_AMBULATORY_CARE_PROVIDER_SITE_OTHER): Payer: 59 | Admitting: Family Medicine

## 2022-03-01 ENCOUNTER — Encounter: Payer: Self-pay | Admitting: Family Medicine

## 2022-03-01 VITALS — BP 120/80 | HR 64 | Ht 63.0 in | Wt 177.0 lb

## 2022-03-01 DIAGNOSIS — E876 Hypokalemia: Secondary | ICD-10-CM

## 2022-03-01 DIAGNOSIS — E782 Mixed hyperlipidemia: Secondary | ICD-10-CM

## 2022-03-01 DIAGNOSIS — R7303 Prediabetes: Secondary | ICD-10-CM

## 2022-03-01 DIAGNOSIS — I10 Essential (primary) hypertension: Secondary | ICD-10-CM

## 2022-03-01 DIAGNOSIS — I2510 Atherosclerotic heart disease of native coronary artery without angina pectoris: Secondary | ICD-10-CM

## 2022-03-01 MED ORDER — POTASSIUM CHLORIDE ER 10 MEQ PO TBCR: 20.0000 meq | EXTENDED_RELEASE_TABLET | Freq: Every day | ORAL | 1 refills | Status: DC

## 2022-03-01 MED ORDER — LOSARTAN POTASSIUM 100 MG PO TABS: 100.0000 mg | ORAL_TABLET | Freq: Every day | ORAL | 1 refills | Status: DC

## 2022-03-01 MED ORDER — METFORMIN HCL 500 MG PO TABS: 500.0000 mg | ORAL_TABLET | Freq: Every day | ORAL | 1 refills | Status: DC

## 2022-03-01 MED ORDER — CHLORTHALIDONE 25 MG PO TABS: 12.5000 mg | ORAL_TABLET | Freq: Every day | ORAL | 1 refills | Status: DC

## 2022-03-01 MED ORDER — ROSUVASTATIN CALCIUM 40 MG PO TABS: 40.0000 mg | ORAL_TABLET | Freq: Every day | ORAL | 1 refills | Status: DC

## 2022-03-01 NOTE — Patient Instructions (Signed)
Mediterranean Diet °A Mediterranean diet refers to food and lifestyle choices that are based on the traditions of countries located on the [place] Sea. It focuses on eating more fruits, vegetables, whole grains, beans, nuts, seeds, and heart-healthy fats, and eating less dairy, meat, eggs, and processed foods with added sugar, salt, and fat. This way of eating has been shown to help prevent certain conditions and improve outcomes for people who have chronic diseases, like kidney disease and heart disease. °What are tips for following this plan? °Reading food labels °Check the serving size of packaged foods. For foods such as rice and pasta, the serving size refers to the amount of cooked product, not dry. °Check the total fat in packaged foods. Avoid foods that have saturated fat or trans fats. °Check the ingredient list for added sugars, such as corn syrup. °Shopping ° °Buy a variety of foods that offer a balanced diet, including: °Fresh fruits and vegetables (produce). °Grains, beans, nuts, and seeds. Some of these may be available in unpackaged forms or large amounts (in bulk). °Fresh seafood. °Poultry and eggs. °Low-fat dairy products. °Buy whole ingredients instead of prepackaged foods. °Buy fresh fruits and vegetables in-season from local farmers markets. °Buy plain frozen fruits and vegetables. °If you do not have access to quality fresh seafood, buy precooked frozen shrimp or canned fish, such as tuna, salmon, or sardines. °Stock your pantry so you always have certain foods on hand, such as olive oil, canned tuna, canned tomatoes, rice, pasta, and beans. °Cooking °Cook foods with extra-virgin olive oil instead of using butter or other vegetable oils. °Have meat as a side dish, and have vegetables or grains as your main dish. This means having meat in small portions or adding small amounts of meat to foods like pasta or stew. °Use beans or vegetables instead of meat in common dishes like chili or  lasagna. °Experiment with different cooking methods. Try roasting, broiling, steaming, and sautéing vegetables. °Add frozen vegetables to soups, stews, pasta, or rice. °Add nuts or seeds for added healthy fats and plant protein at each meal. You can add these to yogurt, salads, or vegetable dishes. °Marinate fish or vegetables using olive oil, lemon juice, garlic, and fresh herbs. °Meal planning °Plan to eat one vegetarian meal one day each week. Try to work up to two vegetarian meals, if possible. °Eat seafood two or more times a week. °Have healthy snacks readily available, such as: °Vegetable sticks with hummus. °Greek yogurt. °Fruit and nut trail mix. °Eat balanced meals throughout the week. This includes: °Fruit: 2-3 servings a day. °Vegetables: 4-5 servings a day. °Low-fat dairy: 2 servings a day. °Fish, poultry, or lean meat: 1 serving a day. °Beans and legumes: 2 or more servings a week. °Nuts and seeds: 1-2 servings a day. °Whole grains: 6-8 servings a day. °Extra-virgin olive oil: 3-4 servings a day. °Limit red meat and sweets to only a few servings a month. °Lifestyle ° °Cook and eat meals together with your family, when possible. °Drink enough fluid to keep your urine pale yellow. °Be physically active every day. This includes: °Aerobic exercise like running or swimming. °Leisure activities like gardening, walking, or housework. °Get 7-8 hours of sleep each night. °If recommended by your health care provider, drink red wine in moderation. This means 1 glass a day for nonpregnant women and 2 glasses a day for men. A glass of wine equals 5 oz (150 mL). °What foods should I eat? °Fruits °Apples. Apricots. Avocado. Berries. Bananas. Cherries. Dates.   Figs. Grapes. Lemons. Melon. Oranges. Peaches. Plums. Pomegranate. °Vegetables °Artichokes. Beets. Broccoli. Cabbage. Carrots. Eggplant. Green beans. Chard. Kale. Spinach. Onions. Leeks. Peas. Squash. Tomatoes. Peppers. Radishes. °Grains °Whole-grain pasta. Brown  rice. Bulgur wheat. Polenta. Couscous. Whole-wheat bread. Oatmeal. Quinoa. °Meats and other proteins °Beans. Almonds. Sunflower seeds. Pine nuts. Peanuts. Cod. Salmon. Scallops. Shrimp. Tuna. Tilapia. Clams. Oysters. Eggs. Poultry without skin. °Dairy °Low-fat milk. Cheese. Greek yogurt. °Fats and oils °Extra-virgin olive oil. Avocado oil. Grapeseed oil. °Beverages °Water. Red wine. Herbal tea. °Sweets and desserts °Greek yogurt with honey. Baked apples. Poached pears. Trail mix. °Seasonings and condiments °Basil. Cilantro. Coriander. Cumin. Mint. Parsley. Sage. Rosemary. Tarragon. Garlic. Oregano. Thyme. Pepper. Balsamic vinegar. Tahini. Hummus. Tomato sauce. Olives. Mushrooms. °The items listed above may not be a complete list of foods and beverages you can eat. Contact a dietitian for more information. °What foods should I limit? °This is a list of foods that should be eaten rarely or only on special occasions. °Fruits °Fruit canned in syrup. °Vegetables °Deep-fried potatoes (french fries). °Grains °Prepackaged pasta or rice dishes. Prepackaged cereal with added sugar. Prepackaged snacks with added sugar. °Meats and other proteins °Beef. Pork. Lamb. Poultry with skin. Hot dogs. Bacon. °Dairy °Ice cream. Sour cream. Whole milk. °Fats and oils °Butter. Canola oil. Vegetable oil. Beef fat (tallow). Lard. °Beverages °Juice. Sugar-sweetened soft drinks. Beer. Liquor and spirits. °Sweets and desserts °Cookies. Cakes. Pies. Candy. °Seasonings and condiments °Mayonnaise. Pre-made sauces and marinades. °The items listed above may not be a complete list of foods and beverages you should limit. Contact a dietitian for more information. °Summary °The Mediterranean diet includes both food and lifestyle choices. °Eat a variety of fresh fruits and vegetables, beans, nuts, seeds, and whole grains. °Limit the amount of red meat and sweets that you eat. °If recommended by your health care provider, drink red wine in moderation.  This means 1 glass a day for nonpregnant women and 2 glasses a day for men. A glass of wine equals 5 oz (150 mL). °This information is not intended to replace advice given to you by your health care provider. Make sure you discuss any questions you have with your health care provider. °Document Revised: 12/11/2019 Document Reviewed: 10/08/2019 °Elsevier Patient Education © 2022 [employer] ° °

## 2022-03-01 NOTE — Progress Notes (Signed)
? ? ?Date:  03/01/2022  ? ?Name:  Kelsey Terry   DOB:  June 09, 1961   MRN:  [id number] ? ? ?Chief Complaint: Hyperlipidemia, Hypertension, hypokalemia, and Prediabetes ? ?Hyperlipidemia ?This is a chronic problem. The current episode started more than 1 year ago. The problem is controlled. Recent lipid tests were reviewed and are normal. Exacerbating diseases include diabetes. She has no history of chronic renal disease, hypothyroidism, liver disease, obesity or nephrotic syndrome. There are no known factors aggravating her hyperlipidemia. Pertinent negatives include no chest pain, focal sensory loss, focal weakness, leg pain, myalgias or shortness of breath. Current antihyperlipidemic treatment includes statins. The current treatment provides moderate improvement of lipids. There are no compliance problems.   ?Hypertension ?This is a chronic problem. The current episode started more than 1 year ago. The problem has been gradually improving since onset. The problem is controlled. Pertinent negatives include no chest pain, headaches, palpitations or shortness of breath. Past treatments include diuretics and angiotensin blockers. The current treatment provides moderate improvement. Hypertensive end-organ damage includes CAD/MI. There is no history of chronic renal disease.  ?Diabetes ?She presents for her follow-up diabetic visit. Diabetes type: for prediabetes. Her disease course has been improving. Pertinent negatives for hypoglycemia include no dizziness, headaches or nervousness/anxiousness. There are no diabetic associated symptoms. Pertinent negatives for diabetes include no chest pain, no fatigue and no weakness. Symptoms are stable. Diabetic complications include heart disease. Risk factors for coronary artery disease include dyslipidemia and hypertension.  ? ?Lab Results  ?Component Value Date  ? NA 143 08/30/2021  ? K 3.5 08/30/2021  ? CO2 27 08/30/2021  ? GLUCOSE 76 08/30/2021  ? BUN 14 08/30/2021  ? CREATININE  1.00 08/30/2021  ? CALCIUM 10.4 (H) 08/30/2021  ? EGFR 64 08/30/2021  ? GFRNONAA 60 (L) 06/27/2020  ? ?Lab Results  ?Component Value Date  ? CHOL 150 01/19/2021  ? HDL 57 01/19/2021  ? [place] 79 01/19/2021  ? TRIG 71 01/19/2021  ? CHOLHDL 2.9 01/20/2020  ? ?Lab Results  ?Component Value Date  ? TSH 1.350 11/20/2018  ? ?Lab Results  ?Component Value Date  ? HGBA1C 6.0 (H) 08/30/2021  ? ?Lab Results  ?Component Value Date  ? WBC 5.3 06/27/2020  ? HGB 14.0 06/27/2020  ? HCT 44.5 06/27/2020  ? MCV 84.6 06/27/2020  ? PLT 210 06/27/2020  ? ?Lab Results  ?Component Value Date  ? ALT 18 01/19/2021  ? AST 20 01/19/2021  ? ALKPHOS 58 01/19/2021  ? BILITOT 0.8 01/19/2021  ? ?No results found for: 25OHVITD2, [place], VD25OH  ? ?Review of Systems  ?Constitutional: Negative.  Negative for chills, fatigue, fever and unexpected weight change.  ?HENT:  Negative for congestion, ear discharge, ear pain, rhinorrhea, sinus pressure, sneezing and sore throat.   ?Respiratory:  Negative for cough, shortness of breath, wheezing and stridor.   ?Cardiovascular:  Negative for chest pain and palpitations.  ?Gastrointestinal:  Negative for abdominal pain, blood in stool, constipation, diarrhea and nausea.  ?Genitourinary:  Negative for dysuria, flank pain, frequency, hematuria, urgency and vaginal discharge.  ?Musculoskeletal:  Negative for arthralgias, back pain and myalgias.  ?Skin:  Negative for rash.  ?Neurological:  Negative for dizziness, focal weakness, weakness and headaches.  ?Hematological:  Negative for adenopathy. Does not bruise/bleed easily.  ?Psychiatric/Behavioral:  Negative for dysphoric mood. The patient is not nervous/anxious.   ? ?Patient Active Problem List  ? Diagnosis Date Noted  ? Hyperlipidemia 03/06/2021  ? Familial hypercholesterolemia 08/23/2020  ? Acute bilateral  knee pain 01/13/2020  ? Health care maintenance 11/24/2019  ? H/O elevated lipids 11/04/2019  ? Lymphedema 08/26/2019  ? Chronic venous insufficiency  08/26/2019  ? Encounter for screening colonoscopy   ? Polyp of sigmoid colon   ? Polyp of ascending colon   ? Lymphedema of left leg 06/16/2019  ? Prediabetes 06/16/2019  ? Abnormal laboratory test result 06/16/2019  ? Prediabetes 05/20/2019  ? Essential hypertension 05/19/2019  ? Abscess of left buttock 05/05/2019  ? Essential hypertension 05/05/2019  ? CAD (coronary artery disease) 05/05/2019  ? NSTEMI (non-ST elevated myocardial infarction) ([place]) 05/16/2018  ? ? ?Allergies  ?Allergen Reactions  ? Amoxicillin Itching and Other (See Comments)  ?  Reaction: bump in vaginal region ?Has patient had a PCN reaction causing immediate rash, facial/tongue/throat swelling, SOB or lightheadedness with hypotension: No ?Has patient had a PCN reaction causing severe rash involving mucus membranes or skin necrosis: No ?Has patient had a PCN reaction that required hospitalization: No ?Has patient had a PCN reaction occurring within the last 10 years: No ?If all of the above answers are "NO", then may proceed with Cephalosporin use. ?  ? Erythromycin Hives and Rash  ? ? ?Past Surgical History:  ?Procedure Laterality Date  ? ABDOMINAL HYSTERECTOMY    ? CESAREAN SECTION    ? COLONOSCOPY WITH PROPOFOL N/A 08/13/2019  ? Procedure: COLONOSCOPY WITH BIOPSIES;  Surgeon: Lucilla Lame, MD;  Location: [facility];  Service: Endoscopy;  Laterality: N/A;  ? CORONARY STENT INTERVENTION N/A 05/16/2018  ? Procedure: CORONARY STENT INTERVENTION;  Surgeon: Wellington Hampshire, MD;  Location: [facility];  Service: Cardiovascular;  Laterality: N/A;  ? CORONARY STENT INTERVENTION N/A 05/19/2018  ? Procedure: CORONARY STENT INTERVENTION;  Surgeon: Wellington Hampshire, MD;  Location: [facility];  Service: Cardiovascular;  Laterality: N/A;  ? LEFT HEART CATH AND CORONARY ANGIOGRAPHY N/A 05/16/2018  ? Procedure: LEFT HEART CATH AND CORONARY ANGIOGRAPHY;  Surgeon: Wellington Hampshire, MD;  Location: [facility];  Service:  Cardiovascular;  Laterality: N/A;  ? PERICARDIOCENTESIS    ? POLYPECTOMY N/A 08/13/2019  ? Procedure: POLYPECTOMY;  Surgeon: Lucilla Lame, MD;  Location: [facility];  Service: Endoscopy;  Laterality: N/A;  ? ? ?Social History  ? ?Tobacco Use  ? Smoking status: Never  ? Smokeless tobacco: Never  ?Vaping Use  ? Vaping Use: Never used  ?Substance Use Topics  ? Alcohol use: Never  ? Drug use: Never  ? ? ? ?Medication list has been reviewed and updated. ? ?Current Meds  ?Medication Sig  ? ASPIRIN LOW DOSE 81 MG EC tablet TAKE 1 TABLET BY MOUTH ONCE A DAY  ? Blood Pressure KIT 1 kit by Does not apply route daily.  ? CALCIUM CITRATE-VITAMIN D PO Take 1 tablet by mouth 2 (two) times daily.  ? chlorthalidone (HYGROTON) 25 MG tablet Take 0.5 tablets (12.5 mg total) by mouth daily.  ? losartan (COZAAR) 100 MG tablet Take 1 tablet (100 mg total) by mouth daily.  ? metFORMIN (GLUCOPHAGE) 500 MG tablet Take 1 tablet (500 mg total) by mouth daily.  ? Multiple Vitamin (MULTIVITAMIN) tablet Take 1 tablet by mouth daily.  ? potassium chloride (KLOR-CON) 10 MEQ tablet Take 2 tablets (20 mEq total) by mouth daily.  ? rosuvastatin (CRESTOR) 40 MG tablet Take 1 tablet (40 mg total) by mouth daily.  ? ? ? ?  03/01/2022  ? 10:04 AM 04/18/2021  ? 10:36 AM 08/23/2020  ?  2:22 PM  ?GAD 7 : Generalized Anxiety Score  ?Nervous, Anxious, on Edge 0 0 0  ?Control/stop worrying 0 0 1  ?Worry too much - different things 0 0 0  ?Trouble relaxing 0 0 0  ?Restless 0 0 0  ?Easily annoyed or irritable 0 0 0  ?Afraid - awful might happen 0 0 0  ?Total GAD 7 Score 0 0 1  ?Anxiety Difficulty Not difficult at all  Not difficult at all  ? ? ? ?  03/01/2022  ? 10:04 AM  ?Depression screen PHQ 2/9  ?Decreased Interest 0  ?Down, Depressed, Hopeless 0  ?PHQ - 2 Score 0  ?Altered sleeping 0  ?Tired, decreased energy 0  ?Change in appetite 0  ?Feeling bad or failure about yourself  0  ?Trouble concentrating 0  ?Moving slowly or fidgety/restless 0  ?Suicidal  thoughts 0  ?PHQ-9 Score 0  ?Difficult doing work/chores Not difficult at all  ? ? ?BP Readings from Last 3 Encounters:  ?03/01/22 120/80  ?09/18/21 108/71  ?08/30/21 112/80  ? ? ?Physical Exam ?Vitals and nursing

## 2022-03-02 LAB — COMPREHENSIVE METABOLIC PANEL
ALT: 17 IU/L (ref 0–32)
AST: 24 IU/L (ref 0–40)
Albumin/Globulin Ratio: 1.5 (ref 1.2–2.2)
Albumin: 4.5 g/dL (ref 3.8–4.8)
Alkaline Phosphatase: 56 IU/L (ref 44–121)
BUN/Creatinine Ratio: 14 (ref 12–28)
BUN: 14 mg/dL (ref 8–27)
Bilirubin Total: 0.7 mg/dL (ref 0.0–1.2)
CO2: 28 mmol/L (ref 20–29)
Calcium: 10.3 mg/dL (ref 8.7–10.3)
Chloride: 100 mmol/L (ref 96–106)
Creatinine, Ser: 1 mg/dL (ref 0.57–1.00)
Globulin, Total: 3 g/dL (ref 1.5–4.5)
Glucose: 66 mg/dL — ABNORMAL LOW (ref 70–99)
Potassium: 3.5 mmol/L (ref 3.5–5.2)
Sodium: 140 mmol/L (ref 134–144)
Total Protein: 7.5 g/dL (ref 6.0–8.5)
eGFR: 64 mL/min/{1.73_m2} (ref >59)

## 2022-03-02 LAB — LIPID PANEL WITH LDL/HDL RATIO
Cholesterol, Total: 152 mg/dL (ref 100–199)
HDL: 57 mg/dL (ref >39)
LDL Chol Calc (NIH): 84 mg/dL (ref 0–99)
LDL/HDL Ratio: 1.5 ratio (ref 0.0–3.2)
Triglycerides: 53 mg/dL (ref 0–149)
VLDL Cholesterol Cal: 11 mg/dL (ref 5–40)

## 2022-03-02 LAB — HEMOGLOBIN A1C
Est. average glucose Bld gHb Est-mCnc: 131 mg/dL
Hgb A1c MFr Bld: 6.2 % — ABNORMAL HIGH (ref 4.8–5.6)

## 2022-03-02 LAB — HM DIABETES EYE EXAM

## 2022-03-16 ENCOUNTER — Ambulatory Visit: Payer: 59 | Admitting: Cardiovascular Disease

## 2022-03-28 ENCOUNTER — Other Ambulatory Visit: Payer: Self-pay

## 2022-03-31 ENCOUNTER — Other Ambulatory Visit: Payer: Self-pay

## 2022-03-31 ENCOUNTER — Telehealth: Payer: Self-pay | Admitting: Internal Medicine

## 2022-03-31 ENCOUNTER — Ambulatory Visit
Admission: EM | Admit: 2022-03-31 | Discharge: 2022-03-31 | Disposition: A | Discharge status: Home / Self Care | Payer: 59 | Attending: Physician Assistant | Admitting: Physician Assistant

## 2022-03-31 ENCOUNTER — Encounter: Payer: Self-pay | Admitting: Emergency Medicine

## 2022-03-31 DIAGNOSIS — R051 Acute cough: Secondary | ICD-10-CM | POA: Insufficient documentation

## 2022-03-31 DIAGNOSIS — U071 COVID-19: Secondary | ICD-10-CM | POA: Insufficient documentation

## 2022-03-31 DIAGNOSIS — R059 Cough, unspecified: Secondary | ICD-10-CM | POA: Insufficient documentation

## 2022-03-31 LAB — RESP PANEL BY RT-PCR (FLU A&B, COVID) ARPGX2
Influenza A by PCR: NEGATIVE
Influenza B by PCR: NEGATIVE
SARS Coronavirus 2 by RT PCR: POSITIVE — AB

## 2022-03-31 MED ORDER — GUAIFENESIN-CODEINE 100-10 MG/5ML PO SOLN: 5.0000 mL | Freq: Every evening | ORAL | 0 refills | Status: DC | PRN

## 2022-03-31 MED ORDER — BENZONATATE 200 MG PO CAPS: 200.0000 mg | ORAL_CAPSULE | Freq: Two times a day (BID) | ORAL | 0 refills | Status: DC | PRN

## 2022-03-31 MED ORDER — HYDROCODONE BIT-HOMATROP MBR 5-1.5 MG/5ML PO SOLN: 5.0000 mL | Freq: Every evening | ORAL | 0 refills | Status: DC | PRN

## 2022-03-31 MED ORDER — PAXLOVID (300/100) 20 X 150 MG & 10 X 100MG PO TBPK: 1.0000 | ORAL_TABLET | Freq: Two times a day (BID) | ORAL | 0 refills | Status: DC

## 2022-03-31 MED ORDER — HYDROCODONE BIT-HOMATROP MBR 5-1.5 MG/5ML PO SOLN: 5.0000 mL | Freq: Every day | ORAL | 0 refills | Status: DC

## 2022-03-31 NOTE — ED Triage Notes (Signed)
Patient c/o cough, runny nose, and fatigue and bodyaches that started yesterday.  Patient unsure of fevers.   ?

## 2022-03-31 NOTE — ED Provider Notes (Signed)
[facility]    CSN: [id number] Arrival date & time: 03/31/22  1100      History   Chief Complaint Chief Complaint  Patient presents with   Cough   Nasal Congestion    HPI Kelsey Terry is a 61 y.o. female who presents with onset of cough, rhinitis, body aches and fatigue since yesterday. Has been chilling. Denies having a HA. Her body feels heavy.     Past Medical History:  Diagnosis Date   Allergies    Asthma    Blood dyscrasia    blood clot in right leg   CAD (coronary artery disease) 05/05/2019   CAD in native artery    a. NSTEMI 05/16/18: LHC 05/16/18 - ost-pLAD 95% s/p PCI/DES, mLAD 30%, ostD1 70%, ost-pLCx 30%, mRCA 90%, dRCA 20%; b. staged PCI/DES to [facility] 7/1   Complication of anesthesia    patient stated she had "some kind of reaction to anesthesia, "thought she was having a stroke" during her second cath procedure.   Diabetes mellitus without complication (HCC)    pre-diabetes   Diastolic dysfunction    a. TTE 05/16/18: EF 55-60%, no RWMA, Gr1DD, trivial AI, calcified mitral annulus, mild MR, mildly dilated LA   Dyspnea    with activity   Hypertension    Lymphedema    LLE   Pericarditis    a. ~ 2012 in [place], [place] s/p pericardiocentesis    Wears contact lenses     Patient Active Problem List   Diagnosis Date Noted   Cough 03/31/2022   Hyperlipidemia 03/06/2021   Familial hypercholesterolemia 08/23/2020   Acute bilateral knee pain 01/13/2020   Health care maintenance 11/24/2019   H/O elevated lipids 11/04/2019   Lymphedema 08/26/2019   Chronic venous insufficiency 08/26/2019   Encounter for screening colonoscopy    Polyp of sigmoid colon    Polyp of ascending colon    Lymphedema of left leg 06/16/2019   Prediabetes 06/16/2019   Abnormal laboratory test result 06/16/2019   Prediabetes 05/20/2019   Essential hypertension 05/19/2019   Abscess of left buttock 05/05/2019   Essential hypertension 05/05/2019   CAD (coronary artery disease)  05/05/2019   NSTEMI (non-ST elevated myocardial infarction) (HCC) 05/16/2018    Past Surgical History:  Procedure Laterality Date   ABDOMINAL HYSTERECTOMY     CESAREAN SECTION     COLONOSCOPY WITH PROPOFOL N/A 08/13/2019   Procedure: COLONOSCOPY WITH BIOPSIES;  Surgeon: Midge Minium, MD;  Location: [facility];  Service: Endoscopy;  Laterality: N/A;   CORONARY STENT INTERVENTION N/A 05/16/2018   Procedure: CORONARY STENT INTERVENTION;  Surgeon: Iran Ouch, MD;  Location: [facility];  Service: Cardiovascular;  Laterality: N/A;   CORONARY STENT INTERVENTION N/A 05/19/2018   Procedure: CORONARY STENT INTERVENTION;  Surgeon: Iran Ouch, MD;  Location: [facility];  Service: Cardiovascular;  Laterality: N/A;   LEFT HEART CATH AND CORONARY ANGIOGRAPHY N/A 05/16/2018   Procedure: LEFT HEART CATH AND CORONARY ANGIOGRAPHY;  Surgeon: Iran Ouch, MD;  Location: [facility];  Service: Cardiovascular;  Laterality: N/A;   PERICARDIOCENTESIS     POLYPECTOMY N/A 08/13/2019   Procedure: POLYPECTOMY;  Surgeon: Midge Minium, MD;  Location: [facility];  Service: Endoscopy;  Laterality: N/A;    OB History   No obstetric history on file.      Home Medications    Prior to Admission medications   Medication Sig Start Date End Date Taking? Authorizing Provider  ASPIRIN  LOW DOSE 81 MG EC tablet TAKE 1 TABLET BY MOUTH ONCE A DAY 09/19/21  Yes Iran Ouch, MD  benzonatate (TESSALON) 200 MG capsule Take 1 capsule (200 mg total) by mouth 2 (two) times daily as needed for cough. 03/31/22  Yes Rodriguez-Southworth, Nettie Elm, PA-C  CALCIUM CITRATE-VITAMIN D PO Take 1 tablet by mouth 2 (two) times daily.   Yes [provider]  chlorthalidone (HYGROTON) 25 MG tablet Take 0.5 tablets (12.5 mg total) by mouth daily. 03/01/22  Yes Duanne Limerick, MD  guaiFENesin-codeine 100-10 MG/5ML syrup Take 5 mLs by mouth at bedtime as needed for cough. 03/31/22   Yes Rodriguez-Southworth, Nettie Elm, PA-C  losartan (COZAAR) 100 MG tablet Take 1 tablet (100 mg total) by mouth daily. 03/01/22  Yes Duanne Limerick, MD  metFORMIN (GLUCOPHAGE) 500 MG tablet Take 1 tablet (500 mg total) by mouth daily. 03/01/22 05/30/22 Yes Duanne Limerick, MD  Multiple Vitamin (MULTIVITAMIN) tablet Take 1 tablet by mouth daily.   Yes [provider]  nirmatrelvir & ritonavir (PAXLOVID, 300/100,) 20 x 150 MG & 10 x 100MG  TBPK Take 1 tablet by mouth 2 (two) times daily. 03/31/22  Yes Rodriguez-Southworth, Nettie Elm, PA-C  potassium chloride (KLOR-CON) 10 MEQ tablet Take 2 tablets (20 mEq total) by mouth daily. 03/01/22  Yes Duanne Limerick, MD  rosuvastatin (CRESTOR) 40 MG tablet Take 1 tablet (40 mg total) by mouth daily. 03/01/22  Yes Duanne Limerick, MD  Blood Pressure KIT 1 kit by Does not apply route daily. 07/21/19   Iloabachie, Chioma E, NP    Family History Family History  Problem Relation Age of Onset   Hypertension Mother    Pancreatic cancer Father    Diabetes Brother    Breast cancer Neg Hx     Social History Social History   Tobacco Use   Smoking status: Never   Smokeless tobacco: Never  Vaping Use   Vaping Use: Never used  Substance Use Topics   Alcohol use: Never   Drug use: Never     Allergies   Amoxicillin and Erythromycin   Review of Systems Review of Systems  Constitutional:  Positive for activity change, appetite change, chills and fatigue. Negative for diaphoresis and fever.  HENT:  Positive for rhinorrhea. Negative for ear discharge and ear pain.   Eyes:  Negative for discharge.  Respiratory:  Positive for cough.   Gastrointestinal:  Negative for diarrhea, nausea and vomiting.  Musculoskeletal:  Positive for myalgias.  Skin:  Negative for rash.  Neurological:  Negative for headaches.    Physical Exam Triage Vital Signs ED Triage Vitals  Enc Vitals Group     BP 03/31/22 1155 110/69     Pulse Rate 03/31/22 1155 86     Resp  03/31/22 1155 14     Temp 03/31/22 1155 99.8 F (37.7 C)     Temp Source 03/31/22 1155 Oral     SpO2 03/31/22 1155 98 %     Weight 03/31/22 1151 175 lb (79.4 kg)     Height 03/31/22 1151 5\' 3"  (1.6 m)     Head Circumference --      Peak Flow --      Pain Score 03/31/22 1151 6     Pain Loc --      Pain Edu? --      Excl. in GC? --    No data found.  Updated Vital Signs BP 110/69 (BP Location: Left Arm)   Pulse 86  Temp 99.8 F (37.7 C) (Oral)   Resp 14   Ht 5\' 3"  (1.6 m)   Wt 175 lb (79.4 kg)   SpO2 98%   BMI 31.00 kg/m   Visual Acuity Right Eye Distance:   Left Eye Distance:   Bilateral Distance:    Right Eye Near:   Left Eye Near:    Bilateral Near:      Physical Exam Vitals signs and nursing note reviewed.  Constitutional:      General: She is not in acute distress.    Appearance: Normal appearance. She is not ill-appearing, toxic-appearing or diaphoretic.  HENT:     Head: Normocephalic.     Right Ear: Tympanic membrane, ear canal and external ear normal.     Left Ear: Tympanic membrane, ear canal and external ear normal.     Nose: Nose normal.     Mouth/Throat:     Mouth: Mucous membranes are moist.  Eyes:     General: No scleral icterus.       Right eye: No discharge.        Left eye: No discharge.     Conjunctiva/sclera: Conjunctivae normal.  Neck:     Musculoskeletal: Neck supple. No neck rigidity.  Cardiovascular:     Rate and Rhythm: Normal rate and regular rhythm.     Heart sounds: No murmur.  Pulmonary:     Effort: Pulmonary effort is normal.     Breath sounds: Normal breath sounds.  ALymphadenopathy:     Cervical: No cervical adenopathy.  Skin:    General: Skin is warm and dry.     Coloration: Skin is not jaundiced.     Findings: No rash.  Neurological:     Mental Status: She is alert and oriented to person, place, and time.     Gait: Gait normal.  Psychiatric:        Mood and Affect: Mood normal.        Behavior: Behavior normal.         Thought Content: Thought content normal.        Judgment: Judgment normal.    UC Treatments / Results  Labs (all labs ordered are listed, but only abnormal results are displayed) Labs Reviewed  RESP PANEL BY RT-PCR (FLU A&B, COVID) ARPGX2 - Abnormal; Notable for the following components:      Result Value   SARS Coronavirus 2 by RT PCR POSITIVE (*)    All other components within normal limits    EKG   Radiology No results found.  Procedures Procedures (including critical care time)  Medications Ordered in UC Medications - No data to display  Initial Impression / Assessment and Plan / UC Course  I have reviewed the triage vital signs and the nursing notes.  Pertinent labs  results that were available during my care of the patient were reviewed by me and considered in my medical decision making (see chart for details).  Covid infection  I placed her on Paxlovid since her labs from 4/13 are normal Told to d/c the cholesterol medication for one week while on this medication I also prescribed her Tessalon and [facility] as noted.  See instructions.   Final Clinical Impressions(s) / UC Diagnoses   Final diagnoses:  Acute cough  COVID-19 virus infection     Discharge Instructions      Stay quarantined for 5 days, then you may go out in public, but need to wear your mask for 5 more days.  Don't lay around, keep active and walk as much as you are able to to prevent worsening of your symptoms.  Follow up with your family Dr next week.  If you get short of breath and you are able to check  your oxygen with a pulse oxygen meter, if it gets to 92% or less, you need to go to the hospital to be admitted. If you dont have one, come back here and we will assess you.       ED Prescriptions     Medication Sig Dispense Auth. Provider   nirmatrelvir & ritonavir (PAXLOVID, 300/100,) 20 x 150 MG & 10 x 100MG  TBPK Take 1 tablet by mouth 2 (two) times daily. 10 tablet  Rodriguez-Southworth, Nettie Elm, PA-C   benzonatate (TESSALON) 200 MG capsule Take 1 capsule (200 mg total) by mouth 2 (two) times daily as needed for cough. 20 capsule Rodriguez-Southworth, Rissie Sculley, PA-C   guaiFENesin-codeine 100-10 MG/5ML syrup Take 5 mLs by mouth at bedtime as needed for cough. 120 mL Rodriguez-Southworth, Nettie Elm, PA-C      PDMP not reviewed this encounter.   Garey Ham, PA-C 03/31/22 1314

## 2022-03-31 NOTE — Discharge Instructions (Addendum)
Stay quarantined for 5 days, then you may go out in public, but need to wear your mask for 5 more days.   ?Don't lay around, keep active and walk as much as you are able to to prevent worsening of your symptoms.  ?Follow up with your family Dr next week.  ?If you get short of breath and you are able to check  your oxygen with a pulse oxygen meter, if it gets to 92% or less, you need to go to the hospital to be admitted. If you dont have one, come back here and we will assess you.   ?

## 2022-03-31 NOTE — Telephone Encounter (Signed)
Codeine is not covered by [place] [employer] ?

## 2022-04-04 ENCOUNTER — Ambulatory Visit: Payer: Self-pay

## 2022-04-04 NOTE — Telephone Encounter (Incomplete)
?  Chief Complaint: diarrhea ?Symptoms: diarrhea x 2 ?Frequency: today ?Pertinent Negatives: Patient denies abdominal pain, N/V ?Disposition: '[]'$ ED /'[]'$ Urgent Care (no appt availability in office) / '[]'$ Appointment(In office/virtual)/ '[]'$  [facility]/ '[x]'$ Home Care/ '[]'$ Refused Recommended Disposition /'[]'$ [facility]/ '[]'$  Follow-up with PCP ?Additional Notes: pt is taking Paxlovid for COVID and was wondering if diarrhea from that or something else. Pt only has 2 doses left. I advised her it could be from medication but t ? ? ?Reason for Disposition ?? [1] MILD diarrhea AND [2] taking antibiotics ? ?Answer Assessment - Initial Assessment Questions ?1. DIARRHEA SEVERITY: "How bad is the diarrhea?" "How many more stools have you had in the past 24 hours than normal?"  ?  - NO DIARRHEA (SCALE 0) ?  - MILD (SCALE 1-3): Few loose or mushy BMs; increase of 1-3 stools over normal daily number of stools; mild increase in ostomy output. ?  -  MODERATE (SCALE 4-7): Increase of 4-6 stools daily over normal; moderate increase in ostomy output. ?* SEVERE (SCALE 8-10; OR 'WORST POSSIBLE'): Increase of 7 or more stools daily over normal; moderate increase in ostomy output; incontinence. ?    2 ?2. ONSET: "When did the diarrhea begin?"  ?    Today  ?3. BM CONSISTENCY: "How loose or watery is the diarrhea?"  ?    Loose and watery  ?4. VOMITING: "Are you also vomiting?" If Yes, ask: "How many times in the past 24 hours?"  ?    No ?5. ABDOMINAL PAIN: "Are you having any abdominal pain?" If Yes, ask: "What does it feel like?" (e.g., crampy, dull, intermittent, constant)  ?    No ?8. HYDRATION: "Any signs of dehydration?" (e.g., dry mouth [not just dry lips], too weak to stand, dizziness, new weight loss) "When did you last urinate?" ?    No ?10. ANTIBIOTIC USE: "Are you taking antibiotics now or have you taken antibiotics in the past 2 months?" ?      On antivirals for COVID ?11. OTHER SYMPTOMS: "Do you have any other  symptoms?" (e.g., fever, blood in stool) ?      No ? ?Protocols used: Diarrhea-A-AH ? ?

## 2022-05-09 ENCOUNTER — Ambulatory Visit: Payer: Self-pay

## 2022-05-09 NOTE — Telephone Encounter (Signed)
  Chief Complaint: head injury Symptoms: hit R side of head getting into car, heavy feeling, tense neck Frequency: happened Friday started feeling pain yesterday Pertinent Negatives: Patient denies having difficulty moving neck and having HA Disposition: '[]'$ ED /'[]'$ Urgent Care (no appt availability in office) / '[x]'$ Appointment(In office/virtual)/ '[]'$  [facility]/ '[]'$ Home Care/ '[]'$ Refused Recommended Disposition /'[]'$ [facility]/ '[]'$  Follow-up with PCP Additional Notes: scheduled appt for tomorrow at 1600 with Dr. Ronnald Ramp.   Reason for Disposition  [1] After 72 hours AND [2] headache persists  Answer Assessment - Initial Assessment Questions 1. LOCATION: "Where does it hurt?"      R side of head  2. ONSET: "When did the headache start?" (Minutes, hours or days)      friday 3. PATTERN: "Does the pain come and go, or has it been constant since it started?"     Comes and goes  4. SEVERITY: "How bad is the pain?" and "What does it keep you from doing?"  (e.g., Scale 1-10; mild, moderate, or severe)   - MILD (1-3): doesn't interfere with normal activities    - MODERATE (4-7): interferes with normal activities or awakens from sleep    - SEVERE (8-10): excruciating pain, unable to do any normal activities        3/4 6. CAUSE: "What do you think is causing the headache?"     Hit the head getting in the car 9. OTHER SYMPTOMS: "Do you have any other symptoms?" (fever, stiff neck, eye pain, sore throat, cold symptoms)     Tense neck,  Protocols used: Headache-A-AH, Head Injury-A-AH

## 2022-05-10 ENCOUNTER — Encounter: Payer: Self-pay | Admitting: Family Medicine

## 2022-05-10 ENCOUNTER — Ambulatory Visit
Admission: RE | Admit: 2022-05-10 | Discharge: 2022-05-10 | Disposition: A | Discharge status: Home / Self Care | Payer: 59 | Attending: Family Medicine | Admitting: Family Medicine

## 2022-05-10 ENCOUNTER — Ambulatory Visit (INDEPENDENT_AMBULATORY_CARE_PROVIDER_SITE_OTHER): Payer: 59 | Admitting: Family Medicine

## 2022-05-10 ENCOUNTER — Ambulatory Visit
Admission: RE | Admit: 2022-05-10 | Discharge: 2022-05-10 | Disposition: A | Discharge status: Home / Self Care | Payer: 59 | Source: Ambulatory Visit | Attending: Family Medicine | Admitting: Family Medicine

## 2022-05-10 VITALS — BP 118/78 | HR 90 | Ht 63.0 in | Wt 176.0 lb

## 2022-05-10 DIAGNOSIS — S0990XA Unspecified injury of head, initial encounter: Secondary | ICD-10-CM

## 2022-05-10 NOTE — Progress Notes (Signed)
Date:  05/10/2022   Name:  Kelsey Terry   DOB:  1960-12-16   MRN:  [id number]   Chief Complaint: Head Injury (X6 days ago, right side, Getting in car and hit head hard, head feels heavy, no other symptoms, 0 pain scale )  Head Injury  The incident occurred 5 to 7 days ago. Injury mechanism: getting into car/hit right side of head on door frame. There was no loss of consciousness. There was no blood loss. Quality: no pain/positive tenderness. The patient is experiencing no pain. The pain has been improving since the injury. Pertinent negatives include no blurred vision, disorientation, headaches, memory loss, numbness, tinnitus, vomiting or weakness. She has tried nothing for the symptoms. The treatment provided no relief.    Lab Results  Component Value Date   NA 140 03/01/2022   K 3.5 03/01/2022   CO2 28 03/01/2022   GLUCOSE 66 (L) 03/01/2022   BUN 14 03/01/2022   CREATININE 1.00 03/01/2022   CALCIUM 10.3 03/01/2022   EGFR 64 03/01/2022   GFRNONAA 60 (L) 06/27/2020   Lab Results  Component Value Date   CHOL 152 03/01/2022   HDL 57 03/01/2022   LDLCALC 84 03/01/2022   TRIG 53 03/01/2022   CHOLHDL 2.9 01/20/2020   Lab Results  Component Value Date   TSH 1.350 11/20/2018   Lab Results  Component Value Date   HGBA1C 6.2 (H) 03/01/2022   Lab Results  Component Value Date   WBC 5.3 06/27/2020   HGB 14.0 06/27/2020   HCT 44.5 06/27/2020   MCV 84.6 06/27/2020   PLT 210 06/27/2020   Lab Results  Component Value Date   ALT 17 03/01/2022   AST 24 03/01/2022   ALKPHOS 56 03/01/2022   BILITOT 0.7 03/01/2022   No results found for: "25OHVITD2", "25OHVITD3", "VD25OH"   Review of Systems  HENT:  Negative for tinnitus.   Eyes:  Negative for blurred vision.  Respiratory:  Negative for shortness of breath.   Cardiovascular:  Negative for chest pain.  Gastrointestinal:  Negative for vomiting.  Neurological:  Negative for dizziness, syncope, weakness, light-headedness,  numbness and headaches.  Psychiatric/Behavioral:  Negative for memory loss.     Patient Active Problem List   Diagnosis Date Noted   Cough 03/31/2022   Hyperlipidemia 03/06/2021   Familial hypercholesterolemia 08/23/2020   Acute bilateral knee pain 01/13/2020   Health care maintenance 11/24/2019   H/O elevated lipids 11/04/2019   Lymphedema 08/26/2019   Chronic venous insufficiency 08/26/2019   Encounter for screening colonoscopy    Polyp of sigmoid colon    Polyp of ascending colon    Lymphedema of left leg 06/16/2019   Prediabetes 06/16/2019   Abnormal laboratory test result 06/16/2019   Prediabetes 05/20/2019   Essential hypertension 05/19/2019   Abscess of left buttock 05/05/2019   Essential hypertension 05/05/2019   CAD (coronary artery disease) 05/05/2019   NSTEMI (non-ST elevated myocardial infarction) ([place]) 05/16/2018    Allergies  Allergen Reactions   Amoxicillin Itching and Other (See Comments)    Reaction: bump in vaginal region Has patient had a PCN reaction causing immediate rash, facial/tongue/throat swelling, SOB or lightheadedness with hypotension: No Has patient had a PCN reaction causing severe rash involving mucus membranes or skin necrosis: No Has patient had a PCN reaction that required hospitalization: No Has patient had a PCN reaction occurring within the last 10 years: No If all of the above answers are "NO", then may proceed with Cephalosporin use.  Erythromycin Hives and Rash    Past Surgical History:  Procedure Laterality Date   ABDOMINAL HYSTERECTOMY     CESAREAN SECTION     COLONOSCOPY WITH PROPOFOL N/A 08/13/2019   Procedure: COLONOSCOPY WITH BIOPSIES;  Surgeon: Lucilla Lame, MD;  Location: [facility];  Service: Endoscopy;  Laterality: N/A;   CORONARY STENT INTERVENTION N/A 05/16/2018   Procedure: CORONARY STENT INTERVENTION;  Surgeon: Wellington Hampshire, MD;  Location: [facility];  Service: Cardiovascular;  Laterality:  N/A;   CORONARY STENT INTERVENTION N/A 05/19/2018   Procedure: CORONARY STENT INTERVENTION;  Surgeon: Wellington Hampshire, MD;  Location: [facility];  Service: Cardiovascular;  Laterality: N/A;   LEFT HEART CATH AND CORONARY ANGIOGRAPHY N/A 05/16/2018   Procedure: LEFT HEART CATH AND CORONARY ANGIOGRAPHY;  Surgeon: Wellington Hampshire, MD;  Location: [facility];  Service: Cardiovascular;  Laterality: N/A;   PERICARDIOCENTESIS     POLYPECTOMY N/A 08/13/2019   Procedure: POLYPECTOMY;  Surgeon: Lucilla Lame, MD;  Location: [facility];  Service: Endoscopy;  Laterality: N/A;    Social History   Tobacco Use   Smoking status: Never   Smokeless tobacco: Never  Vaping Use   Vaping Use: Never used  Substance Use Topics   Alcohol use: Never   Drug use: Never     Medication list has been reviewed and updated.  Current Meds  Medication Sig   ASPIRIN LOW DOSE 81 MG EC tablet TAKE 1 TABLET BY MOUTH ONCE A DAY   Blood Pressure KIT 1 kit by Does not apply route daily.   CALCIUM CITRATE-VITAMIN D PO Take 1 tablet by mouth 2 (two) times daily.   chlorthalidone (HYGROTON) 25 MG tablet Take 0.5 tablets (12.5 mg total) by mouth daily.   losartan (COZAAR) 100 MG tablet Take 1 tablet (100 mg total) by mouth daily.   metFORMIN (GLUCOPHAGE) 500 MG tablet Take 1 tablet (500 mg total) by mouth daily.   Multiple Vitamin (MULTIVITAMIN) tablet Take 1 tablet by mouth daily.   potassium chloride (KLOR-CON) 10 MEQ tablet Take 2 tablets (20 mEq total) by mouth daily.   rosuvastatin (CRESTOR) 40 MG tablet Take 1 tablet (40 mg total) by mouth daily.       05/10/2022    4:16 PM 03/01/2022   10:04 AM 04/18/2021   10:36 AM 08/23/2020    2:22 PM  GAD 7 : Generalized Anxiety Score  Nervous, Anxious, on Edge 0 0 0 0  Control/stop worrying 0 0 0 1  Worry too much - different things 0 0 0 0  Trouble relaxing 0 0 0 0  Restless 0 0 0 0  Easily annoyed or irritable 0 0 0 0  Afraid - awful might  happen 0 0 0 0  Total GAD 7 Score 0 0 0 1  Anxiety Difficulty Not difficult at all Not difficult at all  Not difficult at all       05/10/2022    4:16 PM  Depression screen PHQ 2/9  Decreased Interest 0  Down, Depressed, Hopeless 0  PHQ - 2 Score 0  Altered sleeping 0  Tired, decreased energy 0  Change in appetite 0  Feeling bad or failure about yourself  0  Trouble concentrating 0  Moving slowly or fidgety/restless 0  PHQ-9 Score 0  Difficult doing work/chores Not difficult at all    BP Readings from Last 3 Encounters:  05/10/22 118/78  03/31/22 110/69  03/01/22 120/80  Physical Exam Vitals and nursing note reviewed. Exam conducted with a chaperone present.  Constitutional:      General: She is not in acute distress.    Appearance: She is not diaphoretic.  HENT:     Head: Normocephalic and atraumatic. No raccoon eyes, contusion, masses or right periorbital erythema.     Jaw: There is normal jaw occlusion.     Comments: Tenderness right temporalis muscle    Right Ear: Tympanic membrane and external ear normal.     Left Ear: Tympanic membrane and external ear normal.     Nose: Nose normal.  Eyes:     General:        Right eye: No discharge.        Left eye: No discharge.     Conjunctiva/sclera: Conjunctivae normal.     Pupils: Pupils are equal, round, and reactive to light.  Neck:     Thyroid: No thyromegaly.     Vascular: No JVD.  Cardiovascular:     Rate and Rhythm: Normal rate and regular rhythm.     Heart sounds: Normal heart sounds. No murmur heard.    No friction rub. No gallop.  Pulmonary:     Effort: Pulmonary effort is normal.     Breath sounds: Normal breath sounds.  Abdominal:     General: Bowel sounds are normal.     Palpations: Abdomen is soft. There is no mass.     Tenderness: There is no abdominal tenderness. There is no guarding.  Musculoskeletal:        General: Normal range of motion.     Cervical back: Normal range of motion and neck  supple.  Lymphadenopathy:     Cervical: No cervical adenopathy.  Skin:    General: Skin is warm and dry.  Neurological:     Mental Status: She is alert.     Deep Tendon Reflexes: Reflexes are normal and symmetric.     Wt Readings from Last 3 Encounters:  05/10/22 176 lb (79.8 kg)  03/31/22 175 lb (79.4 kg)  03/01/22 177 lb (80.3 kg)    BP 118/78   Pulse 90   Ht _0  (1.6 m)   Wt 176 lb (79.8 kg)   SpO2 98%   BMI 31.18 kg/m   Assessment and Plan:  1. Traumatic injury of head, initial encounter New onset.  Patient sustained a traumatic injury to the side of her head with mild tenderness to the temporal parietal aspect.  There is no abnormal depression there is tenderness over light palpation of the underlying temporalis muscle.  Patient is requesting a skull x-ray and that she has had multiple episodes of trauma to her head.  None of which have resulted in loss of consciousness.  We will get 1-3 views of the skull to rule out fracture and to reassure the patient that there is no persistence of injury. - DG Skull 1-3 Views; Future

## 2022-05-18 ENCOUNTER — Other Ambulatory Visit: Payer: Self-pay | Admitting: Family Medicine

## 2022-05-18 ENCOUNTER — Other Ambulatory Visit: Payer: Self-pay | Admitting: Obstetrics and Gynecology

## 2022-05-18 DIAGNOSIS — Z1231 Encounter for screening mammogram for malignant neoplasm of breast: Secondary | ICD-10-CM

## 2022-06-13 ENCOUNTER — Ambulatory Visit: Payer: 59

## 2022-06-14 ENCOUNTER — Ambulatory Visit: Payer: 59 | Admitting: Cardiovascular Disease

## 2022-06-19 ENCOUNTER — Ambulatory Visit
Admission: RE | Admit: 2022-06-19 | Discharge: 2022-06-19 | Disposition: A | Discharge status: Home / Self Care | Payer: 59 | Source: Ambulatory Visit | Attending: Obstetrics and Gynecology | Admitting: Obstetrics and Gynecology

## 2022-06-19 DIAGNOSIS — Z1231 Encounter for screening mammogram for malignant neoplasm of breast: Secondary | ICD-10-CM | POA: Diagnosis not present

## 2022-07-05 ENCOUNTER — Ambulatory Visit (INDEPENDENT_AMBULATORY_CARE_PROVIDER_SITE_OTHER): Payer: 59 | Admitting: Family Medicine

## 2022-07-05 ENCOUNTER — Ambulatory Visit: Payer: 59 | Admitting: Family Medicine

## 2022-07-05 ENCOUNTER — Encounter: Payer: Self-pay | Admitting: Family Medicine

## 2022-07-05 VITALS — BP 110/80 | HR 72 | Ht 63.0 in | Wt 178.0 lb

## 2022-07-05 DIAGNOSIS — R7303 Prediabetes: Secondary | ICD-10-CM | POA: Diagnosis not present

## 2022-07-05 DIAGNOSIS — H6123 Impacted cerumen, bilateral: Secondary | ICD-10-CM | POA: Diagnosis not present

## 2022-07-05 MED ORDER — CARBAMIDE PEROXIDE 6.5 % OT SOLN: 5.0000 [drp] | Freq: Two times a day (BID) | OTIC | 0 refills | Status: DC

## 2022-07-05 NOTE — Progress Notes (Signed)
Date:  07/05/2022   Name:  Kelsey Terry   DOB:  11-21-1960   MRN:  [id number]   Chief Complaint: Prediabetes  Diabetes She presents for her follow-up diabetic visit. Diabetes type: prediabetes. Her disease course has been stable. Pertinent negatives for hypoglycemia include no dizziness, headaches or nervousness/anxiousness. Pertinent negatives for diabetes include no blurred vision, no chest pain, no fatigue, no foot paresthesias, no foot ulcerations, no polydipsia, no polyuria, no visual change and no weight loss. Symptoms are stable. She is following a generally healthy diet. Meal planning includes avoidance of concentrated sweets and carbohydrate counting. She participates in exercise daily.    Lab Results  Component Value Date   NA 140 03/01/2022   K 3.5 03/01/2022   CO2 28 03/01/2022   GLUCOSE 66 (L) 03/01/2022   BUN 14 03/01/2022   CREATININE 1.00 03/01/2022   CALCIUM 10.3 03/01/2022   EGFR 64 03/01/2022   GFRNONAA 60 (L) 06/27/2020   Lab Results  Component Value Date   CHOL 152 03/01/2022   HDL 57 03/01/2022   LDLCALC 84 03/01/2022   TRIG 53 03/01/2022   CHOLHDL 2.9 01/20/2020   Lab Results  Component Value Date   TSH 1.350 11/20/2018   Lab Results  Component Value Date   HGBA1C 6.2 (H) 03/01/2022   Lab Results  Component Value Date   WBC 5.3 06/27/2020   HGB 14.0 06/27/2020   HCT 44.5 06/27/2020   MCV 84.6 06/27/2020   PLT 210 06/27/2020   Lab Results  Component Value Date   ALT 17 03/01/2022   AST 24 03/01/2022   ALKPHOS 56 03/01/2022   BILITOT 0.7 03/01/2022   No results found for: "25OHVITD2", "25OHVITD3", "VD25OH"   Review of Systems  Constitutional:  Negative for fatigue, unexpected weight change and weight loss.  HENT:  Negative for trouble swallowing.   Eyes:  Negative for blurred vision and visual disturbance.  Respiratory:  Negative for cough and shortness of breath.   Cardiovascular:  Negative for chest pain and palpitations.   Gastrointestinal:  Negative for abdominal pain and blood in stool.  Endocrine: Negative for polydipsia and polyuria.  Genitourinary:  Negative for difficulty urinating.  Neurological:  Negative for dizziness and headaches.  Psychiatric/Behavioral:  The patient is not nervous/anxious.     Patient Active Problem List   Diagnosis Date Noted   Cough 03/31/2022   Hyperlipidemia 03/06/2021   Familial hypercholesterolemia 08/23/2020   Acute bilateral knee pain 01/13/2020   Health care maintenance 11/24/2019   H/O elevated lipids 11/04/2019   Lymphedema 08/26/2019   Chronic venous insufficiency 08/26/2019   Encounter for screening colonoscopy    Polyp of sigmoid colon    Polyp of ascending colon    Lymphedema of left leg 06/16/2019   Prediabetes 06/16/2019   Abnormal laboratory test result 06/16/2019   Prediabetes 05/20/2019   Essential hypertension 05/19/2019   Abscess of left buttock 05/05/2019   Essential hypertension 05/05/2019   CAD (coronary artery disease) 05/05/2019   NSTEMI (non-ST elevated myocardial infarction) ([place]) 05/16/2018    Allergies  Allergen Reactions   Amoxicillin Itching and Other (See Comments)    Reaction: bump in vaginal region Has patient had a PCN reaction causing immediate rash, facial/tongue/throat swelling, SOB or lightheadedness with hypotension: No Has patient had a PCN reaction causing severe rash involving mucus membranes or skin necrosis: No Has patient had a PCN reaction that required hospitalization: No Has patient had a PCN reaction occurring within the last 10  years: No If all of the above answers are "NO", then may proceed with Cephalosporin use.    Erythromycin Hives and Rash    Past Surgical History:  Procedure Laterality Date   ABDOMINAL HYSTERECTOMY     CESAREAN SECTION     COLONOSCOPY WITH PROPOFOL N/A 08/13/2019   Procedure: COLONOSCOPY WITH BIOPSIES;  Surgeon: Lucilla Lame, MD;  Location: [facility];  Service:  Endoscopy;  Laterality: N/A;   CORONARY STENT INTERVENTION N/A 05/16/2018   Procedure: CORONARY STENT INTERVENTION;  Surgeon: Wellington Hampshire, MD;  Location: [facility];  Service: Cardiovascular;  Laterality: N/A;   CORONARY STENT INTERVENTION N/A 05/19/2018   Procedure: CORONARY STENT INTERVENTION;  Surgeon: Wellington Hampshire, MD;  Location: [facility];  Service: Cardiovascular;  Laterality: N/A;   LEFT HEART CATH AND CORONARY ANGIOGRAPHY N/A 05/16/2018   Procedure: LEFT HEART CATH AND CORONARY ANGIOGRAPHY;  Surgeon: Wellington Hampshire, MD;  Location: [facility];  Service: Cardiovascular;  Laterality: N/A;   PERICARDIOCENTESIS     POLYPECTOMY N/A 08/13/2019   Procedure: POLYPECTOMY;  Surgeon: Lucilla Lame, MD;  Location: [facility];  Service: Endoscopy;  Laterality: N/A;    Social History   Tobacco Use   Smoking status: Never   Smokeless tobacco: Never  Vaping Use   Vaping Use: Never used  Substance Use Topics   Alcohol use: Never   Drug use: Never     Medication list has been reviewed and updated.  Current Meds  Medication Sig   ASPIRIN LOW DOSE 81 MG EC tablet TAKE 1 TABLET BY MOUTH ONCE A DAY   Blood Pressure KIT 1 kit by Does not apply route daily.   CALCIUM CITRATE-VITAMIN D PO Take 1 tablet by mouth 2 (two) times daily.   chlorthalidone (HYGROTON) 25 MG tablet Take 0.5 tablets (12.5 mg total) by mouth daily.   losartan (COZAAR) 100 MG tablet Take 1 tablet (100 mg total) by mouth daily.   metFORMIN (GLUCOPHAGE) 500 MG tablet Take 1 tablet (500 mg total) by mouth daily.   Multiple Vitamin (MULTIVITAMIN) tablet Take 1 tablet by mouth daily.   potassium chloride (KLOR-CON) 10 MEQ tablet Take 2 tablets (20 mEq total) by mouth daily.   rosuvastatin (CRESTOR) 40 MG tablet Take 1 tablet (40 mg total) by mouth daily.       07/05/2022    3:15 PM 05/10/2022    4:16 PM 03/01/2022   10:04 AM 04/18/2021   10:36 AM  GAD 7 : Generalized Anxiety  Score  Nervous, Anxious, on Edge 0 0 0 0  Control/stop worrying 0 0 0 0  Worry too much - different things 0 0 0 0  Trouble relaxing 0 0 0 0  Restless 0 0 0 0  Easily annoyed or irritable 0 0 0 0  Afraid - awful might happen 0 0 0 0  Total GAD 7 Score 0 0 0 0  Anxiety Difficulty Not difficult at all Not difficult at all Not difficult at all        07/05/2022    3:15 PM 05/10/2022    4:16 PM 03/01/2022   10:04 AM  Depression screen PHQ 2/9  Decreased Interest 0 0 0  Down, Depressed, Hopeless 0 0 0  PHQ - 2 Score 0 0 0  Altered sleeping 0 0 0  Tired, decreased energy 0 0 0  Change in appetite 0 0 0  Feeling bad or failure about yourself  0 0  0  Trouble concentrating 0 0 0  Moving slowly or fidgety/restless 0 0 0  Suicidal thoughts 0  0  PHQ-9 Score 0 0 0  Difficult doing work/chores Not difficult at all Not difficult at all Not difficult at all    BP Readings from Last 3 Encounters:  07/05/22 110/80  05/10/22 118/78  03/31/22 110/69    Physical Exam Vitals and nursing note reviewed.  HENT:     Head: Normocephalic.     Right Ear: Tympanic membrane normal. There is impacted cerumen.     Left Ear: Tympanic membrane normal. There is impacted cerumen.     Nose: Nose normal.     Mouth/Throat:     Mouth: Mucous membranes are moist.  Cardiovascular:     Heart sounds: No murmur heard.    No gallop.  Pulmonary:     Breath sounds: No wheezing, rhonchi or rales.  Musculoskeletal:     Cervical back: Neck supple.  Neurological:     Mental Status: She is alert.     Wt Readings from Last 3 Encounters:  07/05/22 170 lb (77.1 kg)  05/10/22 176 lb (79.8 kg)  03/31/22 175 lb (79.4 kg)    BP 110/80   Pulse 72   Ht 5' 3"  (1.6 m)   Wt 170 lb (77.1 kg)   BMI 30.11 kg/m   Assessment and Plan: 1. Prediabetes New onset.  Persistent.  Stable.  Last A1c 6.2 and this has been the general range over the past 3 years.  Patient's had no increase in symptomatology such as polyuria  or polydipsia or weight loss..  Patient is currently taking metformin 500 mg once a day and will continue to do so.  We will check A1c to see current level of control and will likely continue current dosing of metformin if this is remained in the same range.  If it has gone up we will also likely call in a glucometer so the patient may also check around blood sugars in the future. - HgB A1c  2. Bilateral impacted cerumen Bilateral impaction of cerumen.  Patient has been given a prescription to [facility] for over-the-counter Debrox 5% otic solution.  This is to be used also with a positive range with low pressure irrigation in the shower.    Otilio Miu, MD

## 2022-07-05 NOTE — Patient Instructions (Signed)

## 2022-07-06 LAB — HEMOGLOBIN A1C
Est. average glucose Bld gHb Est-mCnc: 123 mg/dL
Hgb A1c MFr Bld: 5.9 % — ABNORMAL HIGH (ref 4.8–5.6)

## 2022-07-09 ENCOUNTER — Other Ambulatory Visit: Payer: Self-pay | Admitting: Family Medicine

## 2022-07-09 DIAGNOSIS — R7303 Prediabetes: Secondary | ICD-10-CM

## 2022-07-09 DIAGNOSIS — I2510 Atherosclerotic heart disease of native coronary artery without angina pectoris: Secondary | ICD-10-CM

## 2022-07-09 DIAGNOSIS — I10 Essential (primary) hypertension: Secondary | ICD-10-CM

## 2022-07-09 NOTE — Telephone Encounter (Signed)
Medication Refill - Medication: losartan (COZAAR) 100 MG tablet  metFORMIN (GLUCOPHAGE) 500 MG tablet potassium chloride (KLOR-CON) 10 MEQ tablet rosuvastatin (CRESTOR) 40 MG tablet  Has the patient contacted their pharmacy? Yes.   (Agent: If no, request that the patient contact the pharmacy for the refill. If patient does not wish to contact the pharmacy document the reason why and proceed with request.) (Agent: If yes, when and what did the pharmacy advise?)  Preferred Pharmacy (with phone number or street name): Caller disconnected before confirming. Call dropped  Has the patient been seen for an appointment in the last year OR does the patient have an upcoming appointment? Yes.    Agent: Please be advised that RX refills may take up to 3 business days. We ask that you follow-up with your pharmacy.

## 2022-07-09 NOTE — Progress Notes (Unsigned)
Cardiology Office Note    Date:  07/10/2022   ID:  Kelsey Terry, DOB [birth year], MRN [id number]  PCP:  Juline Patch, MD  Cardiologist:  Kathlyn Sacramento, MD  Electrophysiologist:  None   Chief Complaint: Follow-up  History of Present Illness:   Kelsey Terry is a 61 y.o. female with history of CAD, PAD, pericarditis status post pericardiocentesis in 2012 in [place], [place], HTN, HLD, and lymphedema who presents for follow-up of CAD and PAD.  She was previously followed by cardiology in [place]. She reported being admitted to [facility] in [place] previously with substernal chest pain with workup revealing pericarditis. She underwent successful pericardiocentesis. She has relocated to [place] to be closer to her daughter.  She was admitted with an NSTEMI in 04/2018.  LHC showed severe two-vessel CAD involving the proximal LAD and mid RCA.  She underwent successful PCI/DES to both vessels.  Echo at that time showed normal LV systolic function.  She has not required ischemic evaluation since.  She was noted to have peripheral arterial disease.  ABI was moderately reduced bilaterally with evidence of bilateral SFA occlusion.  She has been managed medically in the context of no claudication.  Outpatient cardiac monitoring in 2019 showed a predominant rhythm of sinus with 1 run of SVT lasting 30 seconds and a 5 beat run of NSVT with occasional PVCs.  She was last seen in the office in 02/2021 and was without symptoms of angina or decompensation.  She had improved her diet and was walking on a daily basis for exercise with her weight being down approximately 14 pounds over the preceding year.  She comes in doing well from a cardiac perspective and is without symptoms of angina or decompensation.  No dizziness, presyncope, or syncope.  Her chronic lymphedema remains stable.  She uses lymphedema pumps nightly.  She is adherent and tolerating cardiac medications without issues.  She  remains active, exercising on a regular basis and follows a heart healthy diet.  Her weight is down 10 pounds when compared to her last clinic visit with lifestyle modification.  Overall, she is pleased from a cardiac perspective and does not have any active cardiac issues or concerns at this time.   Labs independently reviewed: 06/2022 - A1c 5.9 02/2022 - TC 152, TG 53, HDL 57, LDL 84, BUN 14, serum creatinine 1.0, potassium 3.5, albumin 4.5, AST/ALT normal 06/2020 - Hgb 14.0, PLT 210 11/2018 - TSH normal  Past Medical History:  Diagnosis Date   Allergies    Asthma    Blood dyscrasia    blood clot in right leg   CAD (coronary artery disease) 05/05/2019   CAD in native artery    a. NSTEMI 05/16/18: LHC 05/16/18 - ost-pLAD 95% s/p PCI/DES, mLAD 30%, ostD1 70%, ost-pLCx 30%, mRCA 90%, dRCA 20%; b. staged PCI/DES to [facility] 7/1   Complication of anesthesia    patient stated she had "some kind of reaction to anesthesia, "thought she was having a stroke" during her second cath procedure.   Diabetes mellitus without complication ([place])    pre-diabetes   Diastolic dysfunction    a. TTE 05/16/18: EF 55-60%, no RWMA, Gr1DD, trivial AI, calcified mitral annulus, mild MR, mildly dilated LA   Dyspnea    with activity   Hypertension    Lymphedema    LLE   Pericarditis    a. ~ 2012 in [place], [place] s/p pericardiocentesis    Wears contact lenses  Past Surgical History:  Procedure Laterality Date   ABDOMINAL HYSTERECTOMY     CESAREAN SECTION     COLONOSCOPY WITH PROPOFOL N/A 08/13/2019   Procedure: COLONOSCOPY WITH BIOPSIES;  Surgeon: Lucilla Lame, MD;  Location: [facility];  Service: Endoscopy;  Laterality: N/A;   CORONARY STENT INTERVENTION N/A 05/16/2018   Procedure: CORONARY STENT INTERVENTION;  Surgeon: Wellington Hampshire, MD;  Location: [facility];  Service: Cardiovascular;  Laterality: N/A;   CORONARY STENT INTERVENTION N/A 05/19/2018   Procedure: CORONARY STENT INTERVENTION;   Surgeon: Wellington Hampshire, MD;  Location: [facility];  Service: Cardiovascular;  Laterality: N/A;   LEFT HEART CATH AND CORONARY ANGIOGRAPHY N/A 05/16/2018   Procedure: LEFT HEART CATH AND CORONARY ANGIOGRAPHY;  Surgeon: Wellington Hampshire, MD;  Location: [facility];  Service: Cardiovascular;  Laterality: N/A;   PERICARDIOCENTESIS     POLYPECTOMY N/A 08/13/2019   Procedure: POLYPECTOMY;  Surgeon: Lucilla Lame, MD;  Location: [facility];  Service: Endoscopy;  Laterality: N/A;    Current Medications: Current Meds  Medication Sig   Blood Pressure KIT 1 kit by Does not apply route daily.   CALCIUM CITRATE-VITAMIN D PO Take 1 tablet by mouth 2 (two) times daily.   carbamide peroxide (DEBROX) 6.5 % OTIC solution Place 5 drops into both ears 2 (two) times daily.   chlorthalidone (HYGROTON) 25 MG tablet Take 0.5 tablets (12.5 mg total) by mouth daily.   losartan (COZAAR) 100 MG tablet Take 1 tablet (100 mg total) by mouth daily.   metFORMIN (GLUCOPHAGE) 500 MG tablet Take 1 tablet (500 mg total) by mouth daily.   Multiple Vitamin (MULTIVITAMIN) tablet Take 1 tablet by mouth daily.   potassium chloride (KLOR-CON) 10 MEQ tablet Take 2 tablets (20 mEq total) by mouth daily.   rosuvastatin (CRESTOR) 40 MG tablet Take 1 tablet (40 mg total) by mouth daily.   [DISCONTINUED] ASPIRIN LOW DOSE 81 MG EC tablet TAKE 1 TABLET BY MOUTH ONCE A DAY    Allergies:   Amoxicillin and Erythromycin   Social History   Socioeconomic History   Marital status: Widowed    Spouse name: Not on file   Number of children: Not on file   Years of education: Not on file   Highest education level: Not on file  Occupational History   Not on file  Tobacco Use   Smoking status: Never   Smokeless tobacco: Never  Vaping Use   Vaping Use: Never used  Substance and Sexual Activity   Alcohol use: Never   Drug use: Never   Sexual activity: Not Currently  Other Topics Concern   Not on file   Social History Narrative   Not on file   Social Determinants of Health   Financial Resource Strain: High Risk (06/16/2019)   Overall Financial Resource Strain (CARDIA)    Difficulty of Paying Living Expenses: Very hard  Food Insecurity: Food Insecurity Present (06/16/2019)   Hunger Vital Sign    Worried About Running Out of Food in the Last Year: Often true    Ran Out of Food in the Last Year: Often true  Transportation Needs: No Transportation Needs (06/16/2019)   PRAPARE - Hydrologist (Medical): No    Lack of Transportation (Non-Medical): No  Physical Activity: Unknown (06/16/2019)   Exercise Vital Sign    Days of Exercise per Week: 3 days    Minutes of Exercise per Session: Not on file  Stress: No Stress Concern Present (06/16/2019)   [place]    Feeling of Stress : Only a little  Social Connections: Moderately Integrated (06/16/2019)   Social Connection and Isolation Panel [NHANES]    Frequency of Communication with Friends and Family: More than three times a week    Frequency of Social Gatherings with Friends and Family: More than three times a week    Attends Religious Services: More than 4 times per year    Active Member of [employer] or Organizations: Yes    Attends Archivist Meetings: Never    Marital Status: Widowed     Family History:  The patient's family history includes Diabetes in her brother; Hypertension in her mother; Pancreatic cancer in her father. There is no history of Breast cancer.  ROS:   12-point review of systems is negative unless otherwise noted in the HPI.   EKGs/Labs/Other Studies Reviewed:    Studies reviewed were summarized above. The additional studies were reviewed today:  Outpatient cardiac monitor 06/2018: Normal sinus rhythm with no evidence of atrial fibrillation. 1 run of SVT lasting 30 seconds. 5 beat run of ventricular  tachycardia. Occasional PVCs. __________  ABIs 08/05/2018: ABI/TBIToday's ABIToday's TBIPrevious ABIPrevious TBI  +-------+-----------+-----------+------------+------------+  Right  0.69       0.37                                 +-------+-----------+-----------+------------+------------+  Left   0.71       0.35                                 +-------+-----------+-----------+------------+------------+   Waveforms suggestive of bilateral inflow disease. See arterial duplex and  aorto-iliac images     Final Interpretation:  Right: Resting right ankle-brachial index indicates moderate right lower  extremity arterial disease. The right toe-brachial index is abnormal.   Left: Resting left ankle-brachial index indicates moderate left lower  extremity arterial disease. The left toe-brachial index is abnormal.  __________  Lower extremity arterial ultrasound 08/05/2018: Final Interpretation:  Right: Total occlusion noted in the superficial femoral artery. Total  occlusion noted in the superficial femoral artery and/or popliteal artery.  Total occlusion noted in the peroneal artery.   Left: Total occlusion noted in the superficial femoral artery and/or  popliteal artery. Total occlusion noted in the peroneal artery.   Aorto-iliac images were somewhat limited. The right CIA was not seen at  all due to overlying bowel gas.  __________  [facility] 05/19/2018: Previously placed Ost LAD to Prox LAD drug-eluting stents are widely patent. Balloon angioplasty was performed. Mid LAD lesion is 30% stenosed. Ost 1st Diag lesion is 70% stenosed. Ost Cx to Prox Cx lesion is 30% stenosed. Dist RCA lesion is 30% stenosed. Mid RCA lesion is 90% stenosed. A drug-eluting stent was successfully placed using a STENT SIERRA 3.25 X 18 MM. Post intervention, there is a 0% residual stenosis.   1. Successful angioplasty and drug-eluting stent placement to the mid right coronary artery. 2.   Vagal reaction during closure device deployment responded well to atropine, IV fluids and Zofran.     Recommendations: Continue dual antiplatelet therapy for at least one year.  Aggressive treatment of risk factors. I started the patient on rosuvastatin given the possible side effect of atorvastatin. Right femoral artery angiography showed  evidence of occluded SFA which seems to be chronic.  I will evaluate the patient for PAD as an outpatient. __________  Carotid artery ultrasound 05/17/2018: IMPRESSION: 1. Mild bilateral carotid bifurcation plaque resulting in less than 50% diameter stenosis. 2.  Antegrade bilateral vertebral arterial flow. __________  2D echo 05/16/2018: - Left ventricle: The cavity size was normal. There was mild    concentric hypertrophy. Systolic function was normal. The    estimated ejection fraction was in the range of 55% to 60%. Wall    motion was normal; there were no regional wall motion    abnormalities. Doppler parameters are consistent with abnormal    left ventricular relaxation (grade 1 diastolic dysfunction).  - Aortic valve: There was trivial regurgitation.  - Mitral valve: Calcified annulus. There was mild regurgitation.  - Left atrium: The atrium was mildly dilated. __________  LHC 05/16/2018: Mid RCA lesion is 90% stenosed. Dist RCA lesion is 20% stenosed. Ost Cx to Prox Cx lesion is 30% stenosed. Ost LAD to Prox LAD lesion is 95% stenosed. Post intervention, there is a 0% residual stenosis. A drug-eluting stent was successfully placed using a STENT SIERRA 3.25 X 15 MM. Mid LAD lesion is 30% stenosed. Ost 1st Diag lesion is 70% stenosed. A stent was successfully placed.   1. Severe two-vessel coronary artery disease.  The culprit is 95% proximal LAD stenosis.  There is also 90% mid RCA stenosis. 2.  Left ventricular angiography could not be performed due to difficulty advancing the pigtail catheter as a result of severe radial artery spasm.  Obtain an echocardiogram.  3.  Successful angioplasty and drug-eluting stent placement to the proximal LAD.   Recommendations: Dual antiplatelet therapy for at least one year. Blood pressure control and aggressive treatment of risk factors. Staged RCA PCI on Monday.  I might consider the femoral artery approach given radial artery spasm.   EKG:  EKG is ordered today.  The EKG ordered today demonstrates NSR, 61 bpm, no acute ST-T changes  Recent Labs: 03/01/2022: ALT 17; BUN 14; Creatinine, Ser 1.00; Potassium 3.5; Sodium 140  Recent Lipid Panel    Component Value Date/Time   CHOL 152 03/01/2022 1100   TRIG 53 03/01/2022 1100   HDL 57 03/01/2022 1100   CHOLHDL 2.9 01/20/2020 1137   CHOLHDL 4.2 05/16/2018 0009   VLDL 9 05/16/2018 0009   LDLCALC 84 03/01/2022 1100    PHYSICAL EXAM:    VS:  BP 118/72 (BP Location: Left Arm, Patient Position: Sitting, Cuff Size: Large)   Pulse 61   Ht 5' 3"  (1.6 m)   Wt 176 lb 6.4 oz (80 kg)   SpO2 100%   BMI 31.25 kg/m   BMI: Body mass index is 31.25 kg/m.  Physical Exam Vitals reviewed.  Constitutional:      Appearance: She is well-developed.  HENT:     Head: Normocephalic and atraumatic.  Eyes:     General:        Right eye: No discharge.        Left eye: No discharge.  Neck:     Vascular: No JVD.  Cardiovascular:     Rate and Rhythm: Normal rate and regular rhythm.     Heart sounds: Normal heart sounds, S1 normal and S2 normal. Heart sounds not distant. No midsystolic click and no opening snap. No murmur heard.    No friction rub.  Pulmonary:     Effort: Pulmonary effort is normal. No respiratory distress.  Breath sounds: Normal breath sounds. No decreased breath sounds, wheezing or rales.  Chest:     Chest wall: No tenderness.  Abdominal:     General: There is no distension.  Musculoskeletal:     Cervical back: Normal range of motion.     Right lower leg: Edema present.     Left lower leg: Edema present.     Comments:  Bilateral lymphedema noted with left being greater than right, which is at her baseline.  Skin:    General: Skin is warm and dry.     Nails: There is no clubbing.  Neurological:     Mental Status: She is alert and oriented to person, place, and time.  Psychiatric:        Speech: Speech normal.        Behavior: Behavior normal.        Thought Content: Thought content normal.        Judgment: Judgment normal.     Wt Readings from Last 3 Encounters:  07/10/22 176 lb 6.4 oz (80 kg)  07/05/22 178 lb (80.7 kg)  05/10/22 176 lb (79.8 kg)     ASSESSMENT & PLAN:   CAD involving the native coronary arteries without angina: She continues to do very well without any symptoms concerning for angina.  Continue current medical therapy and aggressive risk factor modification/secondary prevention including aspirin, losartan, and rosuvastatin.  No indication for further ischemic testing.  PAD: No symptoms of claudication or nonhealing wounds.  Continue regular exercise and active lifestyle along with current medical therapy.  HTN: Blood pressure is well controlled in the office today.  Continue current medications.  HLD: LDL 84 with goal LDL being less than 70.  She remains on Crestor 40 mg daily.  She will continue her heart healthy diet and regular exercise.  If LDL remains above goal in follow-up, we may need to consider addition of ezetimibe.  Chronic lymphedema: Stable.  Continue with lymphedema pumps and compression socks under the direction of vascular surgery.   Disposition: F/u with Dr. Fletcher Anon or an APP in 12 months.   Medication Adjustments/Labs and Tests Ordered: Current medicines are reviewed at length with the patient today.  Concerns regarding medicines are outlined above. Medication changes, Labs and Tests ordered today are summarized above and listed in the Patient Instructions accessible in Encounters.   Signed, Christell Faith, PA-C 07/10/2022 3:59 PM     [facility] [address] [place], [place] [address] [phone]

## 2022-07-09 NOTE — Telephone Encounter (Signed)
[place] mail services  [facility] [place] [address]  Phone: [phone] Fax: [phone]  Pt called back to request that all of these medications get sent to this pharmacy with 90 day prescriptions.   chlorthalidone (HYGROTON) 25 MG tablet

## 2022-07-10 ENCOUNTER — Encounter: Payer: Self-pay | Admitting: Physician Assistant

## 2022-07-10 ENCOUNTER — Ambulatory Visit (INDEPENDENT_AMBULATORY_CARE_PROVIDER_SITE_OTHER): Payer: 59 | Admitting: Physician Assistant

## 2022-07-10 VITALS — BP 118/72 | HR 61 | Ht 63.0 in | Wt 176.4 lb

## 2022-07-10 DIAGNOSIS — I2510 Atherosclerotic heart disease of native coronary artery without angina pectoris: Secondary | ICD-10-CM

## 2022-07-10 DIAGNOSIS — I739 Peripheral vascular disease, unspecified: Secondary | ICD-10-CM | POA: Diagnosis not present

## 2022-07-10 DIAGNOSIS — I890 Lymphedema, not elsewhere classified: Secondary | ICD-10-CM

## 2022-07-10 DIAGNOSIS — I10 Essential (primary) hypertension: Secondary | ICD-10-CM | POA: Diagnosis not present

## 2022-07-10 DIAGNOSIS — E785 Hyperlipidemia, unspecified: Secondary | ICD-10-CM | POA: Diagnosis not present

## 2022-07-10 MED ORDER — ASPIRIN 81 MG PO TBEC: 81.0000 mg | DELAYED_RELEASE_TABLET | Freq: Every day | ORAL | 3 refills | Status: DC

## 2022-07-10 NOTE — Telephone Encounter (Signed)
Requested Prescriptions  Pending Prescriptions Disp Refills  . metFORMIN (GLUCOPHAGE) 500 MG tablet 90 tablet     Sig: Take 1 tablet (500 mg total) by mouth daily.     Endocrinology:  Diabetes - Biguanides Failed - 07/09/2022  1:29 PM      Failed - B12 Level in normal range and within 720 days    No results found for: "VITAMINB12"       Failed - CBC within normal limits and completed in the last 12 months    WBC  Date Value Ref Range Status  06/27/2020 5.3 4.0 - 10.5 K/uL Final   RBC  Date Value Ref Range Status  06/27/2020 5.26 (H) 3.87 - 5.11 MIL/uL Final   Hemoglobin  Date Value Ref Range Status  06/27/2020 14.0 12.0 - 15.0 g/dL Final  11/20/2018 11.9 11.1 - 15.9 g/dL Final   HCT  Date Value Ref Range Status  06/27/2020 44.5 36.0 - 46.0 % Final   Hematocrit  Date Value Ref Range Status  11/20/2018 37.0 34.0 - 46.6 % Final   MCHC  Date Value Ref Range Status  06/27/2020 31.5 30.0 - 36.0 g/dL Final   [facility]  Date Value Ref Range Status  06/27/2020 26.6 26.0 - 34.0 pg Final   MCV  Date Value Ref Range Status  06/27/2020 84.6 80.0 - 100.0 fL Final  11/20/2018 83 79 - 97 fL Final   No results found for: "PLTCOUNTKUC", "LABPLAT", "POCPLA" RDW  Date Value Ref Range Status  06/27/2020 14.6 11.5 - 15.5 % Final  11/20/2018 13.6 12.3 - 15.4 % Final    Comment:    **Effective November 24, 2018, the RDW pediatric reference**   interval will be removed and the adult reference interval   will be changing to:                             Female 11.7 - 15.4                                                      Female 11.6 - 15.4          Passed - Cr in normal range and within 360 days    Creatinine, Ser  Date Value Ref Range Status  03/01/2022 1.00 0.57 - 1.00 mg/dL Final         Passed - HBA1C is between 0 and 7.9 and within 180 days    Hgb A1c MFr Bld  Date Value Ref Range Status  07/05/2022 5.9 (H) 4.8 - 5.6 % Final    Comment:             Prediabetes: 5.7 - 6.4           Diabetes: >6.4          Glycemic control for adults with diabetes: <7.0          Passed - eGFR in normal range and within 360 days    GFR calc Af Amer  Date Value Ref Range Status  06/27/2020 >60 >60 mL/min Final   GFR calc non Af Amer  Date Value Ref Range Status  06/27/2020 60 (L) >60 mL/min Final   eGFR  Date Value Ref Range Status  03/01/2022 64 >59 mL/min/1.73 Final  Passed - Valid encounter within last 6 months    Recent Outpatient Visits          5 days ago Prediabetes   [facility] [place], [place], MD   2 months ago Traumatic injury of head, initial encounter   [facility] [place], [place], MD   4 months ago Essential hypertension   [facility] [place], Deanna C, MD   10 months ago Essential hypertension   [facility] [place], Deanna C, MD   1 year ago Prediabetes   [facility] at [facility], MD      Future Appointments            Today Dunn, Areta Haber, PA-C [place], [facility]   In 3 months Juline Patch, MD [facility] at [facility], [place]  . chlorthalidone (HYGROTON) 25 MG tablet 45 tablet     Sig: Take 0.5 tablets (12.5 mg total) by mouth daily.     Cardiovascular: Diuretics - Thiazide Passed - 07/09/2022  1:29 PM      Passed - Cr in normal range and within 180 days    Creatinine, Ser  Date Value Ref Range Status  03/01/2022 1.00 0.57 - 1.00 mg/dL Final         Passed - K in normal range and within 180 days    Potassium  Date Value Ref Range Status  03/01/2022 3.5 3.5 - 5.2 mmol/L Final         Passed - Na in normal range and within 180 days    Sodium  Date Value Ref Range  Status  03/01/2022 140 134 - 144 mmol/L Final         Passed - Last BP in normal range    BP Readings from Last 1 Encounters:  07/05/22 110/80         Passed - Valid encounter within last 6 months    Recent Outpatient Visits          5 days ago Prediabetes   [facility] [place], [place], MD   2 months ago Traumatic injury of head, initial encounter   [place] [facility] at [place], Deanna C, MD   4 months ago Essential hypertension   [place] at [place], Deanna C, MD   10 months ago Essential hypertension   [place] [facility] at [place], Deanna C, MD   1 year ago Prediabetes   [facility] at [place], [place], MD      Future Appointments            Today Dunn, Areta Haber, PA-C [place], [facility]   In 3 months Juline Patch, MD [facility] at [facility], [facility]           . potassium chloride (KLOR-CON) 10 MEQ tablet 180 tablet     Sig: Take 2 tablets (20 mEq total) by mouth daily.  Endocrinology:  Minerals - Potassium Supplementation Passed - 07/09/2022  1:29 PM      Passed - K in normal range and within 360 days    Potassium  Date Value Ref Range Status  03/01/2022 3.5 3.5 - 5.2 mmol/L Final         Passed - Cr in normal range and within 360 days    Creatinine, Ser  Date Value Ref Range Status  03/01/2022 1.00 0.57 - 1.00 mg/dL Final         Passed - Valid encounter within last 12 months    Recent Outpatient Visits          5 days ago Prediabetes   [place] [facility] at [place], Deanna C, MD   2 months ago Traumatic injury of head, initial encounter   [place] [facility] at [place], [place], MD    4 months ago Essential hypertension   [place] [facility] at [place], Deanna C, MD   10 months ago Essential hypertension   [place] [facility] at [place], Deanna C, MD   1 year ago Prediabetes   [facility] at [place], [place], MD      Future Appointments            Today Dunn, Areta Haber, PA-C [place], [facility]   In 3 months Juline Patch, MD [facility] at [facility], [facility]           . rosuvastatin (CRESTOR) 40 MG tablet 90 tablet     Sig: Take 1 tablet (40 mg total) by mouth daily.     Cardiovascular:  Antilipid - Statins 2 Failed - 07/09/2022  1:29 PM      Failed - Lipid Panel in normal range within the last 12 months    Cholesterol, Total  Date Value Ref Range Status  03/01/2022 152 100 - 199 mg/dL Final   LDL Chol Calc (NIH)  Date Value Ref Range Status  03/01/2022 84 0 - 99 mg/dL Final   HDL  Date Value Ref Range Status  03/01/2022 57 >39 mg/dL Final   Triglycerides  Date Value Ref Range Status  03/01/2022 53 0 - 149 mg/dL Final         Passed - Cr in normal range and within 360 days    Creatinine, Ser  Date Value Ref Range Status  03/01/2022 1.00 0.57 - 1.00 mg/dL Final         Passed - Patient is not pregnant      Passed - Valid encounter within last 12 months    Recent Outpatient Visits          5 days ago Prediabetes   [place] [facility] [place], Deanna C, MD   2 months ago Traumatic injury of head, initial encounter   [place] [facility] at [place], Deanna C, MD   4 months ago Essential hypertension   [place] [facility] at [place], Deanna C, MD   10 months ago Essential hypertension   [place] [facility] at [place], Deanna C, MD   1 year ago Prediabetes   [facility] and [facility]  at [facility], MD      Future Appointments            Today Dunn, Areta Haber, PA-C [place], [facility]   In 3 months Juline Patch, MD [facility] Primary Care and Sports Medicine at [facility], [facility]

## 2022-07-10 NOTE — Patient Instructions (Signed)
Medication Instructions:  Your physician recommends that you continue on your current medications as directed. Please refer to the Current Medication list given to you today.  *If you need a refill on your cardiac medications before your next appointment, please call your pharmacy*   Lab Work: None  If you have labs (blood work) drawn today and your tests are completely normal, you will receive your results only by: [place] (if you have MyChart) OR A paper copy in the mail If you have any lab test that is abnormal or we need to change your treatment, we will call you to review the results.   Testing/Procedures: None   Follow-Up: At [facility], you and your health needs are our priority.  As part of our continuing mission to provide you with exceptional heart care, we have created designated Provider Care Teams.  These Care Teams include your primary Cardiologist (physician) and Advanced Practice Providers (APPs -  Physician Assistants and Nurse Practitioners) who all work together to provide you with the care you need, when you need it.   Your next appointment:   1 year(s)  The format for your next appointment:   In Person  Provider:   Kathlyn Sacramento, MD or Christell Faith, PA-C       Important Information About Sugar

## 2022-07-10 NOTE — Telephone Encounter (Signed)
Requested medication (s) are due for refill today: yes  Requested medication (s) are on the active medication list: expired 07/05/22  Last refill:  03/01/22 #90 1 RF  Future visit scheduled: yes  Notes to clinic:  medication expired   Requested Prescriptions  Pending Prescriptions Disp Refills   metFORMIN (GLUCOPHAGE) 500 MG tablet 90 tablet     Sig: Take 1 tablet (500 mg total) by mouth daily.     Endocrinology:  Diabetes - Biguanides Failed - 07/09/2022  1:29 PM      Failed - B12 Level in normal range and within 720 days    No results found for: "VITAMINB12"       Failed - CBC within normal limits and completed in the last 12 months    WBC  Date Value Ref Range Status  06/27/2020 5.3 4.0 - 10.5 K/uL Final   RBC  Date Value Ref Range Status  06/27/2020 5.26 (H) 3.87 - 5.11 MIL/uL Final   Hemoglobin  Date Value Ref Range Status  06/27/2020 14.0 12.0 - 15.0 g/dL Final  11/20/2018 11.9 11.1 - 15.9 g/dL Final   HCT  Date Value Ref Range Status  06/27/2020 44.5 36.0 - 46.0 % Final   Hematocrit  Date Value Ref Range Status  11/20/2018 37.0 34.0 - 46.6 % Final   MCHC  Date Value Ref Range Status  06/27/2020 31.5 30.0 - 36.0 g/dL Final   [facility]  Date Value Ref Range Status  06/27/2020 26.6 26.0 - 34.0 pg Final   MCV  Date Value Ref Range Status  06/27/2020 84.6 80.0 - 100.0 fL Final  11/20/2018 83 79 - 97 fL Final   No results found for: "PLTCOUNTKUC", "LABPLAT", "POCPLA" RDW  Date Value Ref Range Status  06/27/2020 14.6 11.5 - 15.5 % Final  11/20/2018 13.6 12.3 - 15.4 % Final    Comment:    **Effective November 24, 2018, the RDW pediatric reference**   interval will be removed and the adult reference interval   will be changing to:                             Female 11.7 - 15.4                                                      Female 11.6 - 15.4          Passed - Cr in normal range and within 360 days    Creatinine, Ser  Date Value Ref Range Status   03/01/2022 1.00 0.57 - 1.00 mg/dL Final         Passed - HBA1C is between 0 and 7.9 and within 180 days    Hgb A1c MFr Bld  Date Value Ref Range Status  07/05/2022 5.9 (H) 4.8 - 5.6 % Final    Comment:             Prediabetes: 5.7 - 6.4          Diabetes: >6.4          Glycemic control for adults with diabetes: <7.0          Passed - eGFR in normal range and within 360 days    GFR calc Af Amer  Date Value Ref Range Status  06/27/2020 >  60 >60 mL/min Final   GFR calc non Af Amer  Date Value Ref Range Status  06/27/2020 60 (L) >60 mL/min Final   eGFR  Date Value Ref Range Status  03/01/2022 64 >59 mL/min/1.73 Final         Passed - Valid encounter within last 6 months    Recent Outpatient Visits           5 days ago Prediabetes   [place] [facility] [place], Deanna C, MD   2 months ago Traumatic injury of head, initial encounter   [place] [facility] at [place], [place], MD   4 months ago Essential hypertension   [place] [facility] at [place], [place], MD   10 months ago Essential hypertension   [place] [facility] at [place], Deanna C, MD   1 year ago Prediabetes   [place] [facility] at [place], [place], MD       Future Appointments             Today Rise Mu, PA-C [place], [facility]   In 3 months Juline Patch, MD [facility] Primary [facility] at [facility], [facility]            Refused Prescriptions Disp Refills   chlorthalidone (HYGROTON) 25 MG tablet 45 tablet     Sig: Take 0.5 tablets (12.5 mg total) by mouth daily.     Cardiovascular: Diuretics - Thiazide Passed - 07/09/2022  1:29 PM      Passed - Cr in normal range and within 180 days    Creatinine, Ser  Date Value Ref Range Status  03/01/2022 1.00  0.57 - 1.00 mg/dL Final         Passed - K in normal range and within 180 days    Potassium  Date Value Ref Range Status  03/01/2022 3.5 3.5 - 5.2 mmol/L Final         Passed - Na in normal range and within 180 days    Sodium  Date Value Ref Range Status  03/01/2022 140 134 - 144 mmol/L Final         Passed - Last BP in normal range    BP Readings from Last 1 Encounters:  07/05/22 110/80         Passed - Valid encounter within last 6 months    Recent Outpatient Visits           5 days ago Prediabetes   [place] [facility] at [place], Deanna C, MD   2 months ago Traumatic injury of head, initial encounter   [place] [facility] at [place], Deanna C, MD   4 months ago Essential hypertension   [place] [facility] at [place], Deanna C, MD   10 months ago Essential hypertension   [place] [facility] at [place], Deanna C, MD   1 year ago Prediabetes   [place] [facility] at [place], [place], MD       Future Appointments             Today Rise Mu, PA-C [facility], [facility]   In  3 months Juline Patch, MD [facility] Primary [facility] at [facility], [facility]             potassium chloride (KLOR-CON) 10 MEQ tablet 180 tablet     Sig: Take 2 tablets (20 mEq total) by mouth daily.     Endocrinology:  Minerals - Potassium Supplementation Passed - 07/09/2022  1:29 PM      Passed - K in normal range and within 360 days    Potassium  Date Value Ref Range Status  03/01/2022 3.5 3.5 - 5.2 mmol/L Final         Passed - Cr in normal range and within 360 days    Creatinine, Ser  Date Value Ref Range Status  03/01/2022 1.00 0.57 - 1.00 mg/dL Final         Passed - Valid encounter within last 12 months    Recent Outpatient Visits            5 days ago Prediabetes   [place] [facility] at [place], Deanna C, MD   2 months ago Traumatic injury of head, initial encounter   [place] [facility] at [place], [place], MD   4 months ago Essential hypertension   [place] [facility] at [place], [place], MD   10 months ago Essential hypertension   [place] [facility] at [place], Deanna C, MD   1 year ago Prediabetes   [place] [facility] at [place], [place], MD       Future Appointments             Today Rise Mu, PA-C [place], [facility]   In 3 months Juline Patch, MD [facility] Primary Care and Sports Medicine at [facility], [facility]             rosuvastatin (CRESTOR) 40 MG tablet 90 tablet     Sig: Take 1 tablet (40 mg total) by mouth daily.     Cardiovascular:  Antilipid - Statins 2 Failed - 07/09/2022  1:29 PM      Failed - Lipid Panel in normal range within the last 12 months    Cholesterol, Total  Date Value Ref Range Status  03/01/2022 152 100 - 199 mg/dL Final   LDL Chol Calc (NIH)  Date Value Ref Range Status  03/01/2022 84 0 - 99 mg/dL Final   HDL  Date Value Ref Range Status  03/01/2022 57 >39 mg/dL Final   Triglycerides  Date Value Ref Range Status  03/01/2022 53 0 - 149 mg/dL Final         Passed - Cr in normal range and within 360 days    Creatinine, Ser  Date Value Ref Range Status  03/01/2022 1.00 0.57 - 1.00 mg/dL Final         Passed - Patient is not pregnant      Passed - Valid encounter within last 12 months    Recent Outpatient Visits           5 days ago Prediabetes    [facility] at [place], Deanna C, MD   2 months ago Traumatic injury of head, initial encounter   [place] [facility] at [place], Deanna C, MD  4 months ago Essential hypertension   [place] [facility] at [place], [place], MD   10 months ago Essential hypertension   [place] [facility] at [place], Deanna C, MD   1 year ago Prediabetes   [facility] and [facility] at [facility], MD       Future Appointments             Today Dunn, Areta Haber, PA-C [place], [facility]   In 3 months Juline Patch, MD [facility] Primary Care and Sports Medicine at [facility], [facility]

## 2022-07-12 ENCOUNTER — Other Ambulatory Visit: Payer: Self-pay | Admitting: *Deleted

## 2022-07-12 ENCOUNTER — Encounter: Payer: Self-pay | Admitting: Family Medicine

## 2022-07-12 DIAGNOSIS — I2510 Atherosclerotic heart disease of native coronary artery without angina pectoris: Secondary | ICD-10-CM

## 2022-07-12 MED ORDER — ASPIRIN 81 MG PO TBEC: 81.0000 mg | DELAYED_RELEASE_TABLET | Freq: Every day | ORAL | 3 refills | Status: DC

## 2022-07-26 DIAGNOSIS — H401112 Primary open-angle glaucoma, right eye, moderate stage: Secondary | ICD-10-CM | POA: Diagnosis not present

## 2022-09-17 ENCOUNTER — Encounter (INDEPENDENT_AMBULATORY_CARE_PROVIDER_SITE_OTHER): Payer: Self-pay

## 2022-09-17 ENCOUNTER — Telehealth: Payer: Self-pay | Admitting: Family Medicine

## 2022-09-20 ENCOUNTER — Ambulatory Visit (INDEPENDENT_AMBULATORY_CARE_PROVIDER_SITE_OTHER): Payer: 59 | Admitting: Vascular Surgery

## 2022-09-20 ENCOUNTER — Other Ambulatory Visit: Payer: Self-pay

## 2022-09-20 DIAGNOSIS — I10 Essential (primary) hypertension: Secondary | ICD-10-CM

## 2022-09-20 DIAGNOSIS — R7303 Prediabetes: Secondary | ICD-10-CM

## 2022-09-20 DIAGNOSIS — I2510 Atherosclerotic heart disease of native coronary artery without angina pectoris: Secondary | ICD-10-CM

## 2022-09-20 MED ORDER — POTASSIUM CHLORIDE ER 10 MEQ PO TBCR: 20.0000 meq | EXTENDED_RELEASE_TABLET | Freq: Every day | ORAL | 0 refills | Status: DC

## 2022-09-20 MED ORDER — CHLORTHALIDONE 25 MG PO TABS: 12.5000 mg | ORAL_TABLET | Freq: Every day | ORAL | 0 refills | Status: DC

## 2022-09-20 MED ORDER — LOSARTAN POTASSIUM 100 MG PO TABS: 100.0000 mg | ORAL_TABLET | Freq: Every day | ORAL | 0 refills | Status: DC

## 2022-09-20 MED ORDER — ROSUVASTATIN CALCIUM 40 MG PO TABS: 40.0000 mg | ORAL_TABLET | Freq: Every day | ORAL | 0 refills | Status: DC

## 2022-09-20 MED ORDER — METFORMIN HCL 500 MG PO TABS: 500.0000 mg | ORAL_TABLET | Freq: Every day | ORAL | 0 refills | Status: DC

## 2022-10-01 DIAGNOSIS — H401112 Primary open-angle glaucoma, right eye, moderate stage: Secondary | ICD-10-CM | POA: Diagnosis not present

## 2022-10-15 ENCOUNTER — Telehealth: Payer: Self-pay | Admitting: Cardiovascular Disease

## 2022-10-15 DIAGNOSIS — I2510 Atherosclerotic heart disease of native coronary artery without angina pectoris: Secondary | ICD-10-CM

## 2022-10-15 MED ORDER — ASPIRIN 81 MG PO TBEC: 81.0000 mg | DELAYED_RELEASE_TABLET | Freq: Every day | ORAL | 3 refills | Status: DC

## 2022-10-15 MED ORDER — ASPIRIN 81 MG PO TBEC: 81.0000 mg | DELAYED_RELEASE_TABLET | Freq: Every day | ORAL | 2 refills | Status: DC

## 2022-10-15 NOTE — Addendum Note (Signed)
Addended by: Lubertha Sayres on: 10/15/2022 01:22 PM   Modules accepted: Orders

## 2022-10-15 NOTE — Telephone Encounter (Signed)
*  STAT* If patient is at the pharmacy, call can be transferred to refill team.   1. Which medications need to be refilled? (please list name of each medication and dose if known) aspirin EC (ASPIRIN LOW DOSE) 81 MG tablet   2. Which pharmacy/location (including street and city if local pharmacy) is medication to be sent to? [facility]  3. Do they need a 30 day or 90 day supply? 90 day  Patient will be out tomorrow.

## 2022-10-23 DIAGNOSIS — Z01419 Encounter for gynecological examination (general) (routine) without abnormal findings: Secondary | ICD-10-CM | POA: Diagnosis not present

## 2022-10-23 DIAGNOSIS — Z1272 Encounter for screening for malignant neoplasm of vagina: Secondary | ICD-10-CM | POA: Diagnosis not present

## 2022-10-23 DIAGNOSIS — Z1211 Encounter for screening for malignant neoplasm of colon: Secondary | ICD-10-CM | POA: Diagnosis not present

## 2022-11-06 ENCOUNTER — Ambulatory Visit: Payer: 59 | Admitting: Family Medicine

## 2022-11-06 ENCOUNTER — Ambulatory Visit (INDEPENDENT_AMBULATORY_CARE_PROVIDER_SITE_OTHER): Payer: 59 | Admitting: Family Medicine

## 2022-11-06 ENCOUNTER — Encounter: Payer: Self-pay | Admitting: Family Medicine

## 2022-11-06 VITALS — BP 122/78 | HR 66 | Ht 63.0 in | Wt 175.0 lb

## 2022-11-06 DIAGNOSIS — Z1159 Encounter for screening for other viral diseases: Secondary | ICD-10-CM | POA: Diagnosis not present

## 2022-11-06 DIAGNOSIS — I10 Essential (primary) hypertension: Secondary | ICD-10-CM | POA: Diagnosis not present

## 2022-11-06 DIAGNOSIS — I2510 Atherosclerotic heart disease of native coronary artery without angina pectoris: Secondary | ICD-10-CM

## 2022-11-06 DIAGNOSIS — H401112 Primary open-angle glaucoma, right eye, moderate stage: Secondary | ICD-10-CM | POA: Diagnosis not present

## 2022-11-06 DIAGNOSIS — R7303 Prediabetes: Secondary | ICD-10-CM | POA: Diagnosis not present

## 2022-11-06 MED ORDER — ROSUVASTATIN CALCIUM 40 MG PO TABS: 40.0000 mg | ORAL_TABLET | Freq: Every day | ORAL | 1 refills | Status: DC

## 2022-11-06 MED ORDER — CHLORTHALIDONE 25 MG PO TABS: 12.5000 mg | ORAL_TABLET | Freq: Every day | ORAL | 1 refills | Status: DC

## 2022-11-06 MED ORDER — METFORMIN HCL 500 MG PO TABS: 500.0000 mg | ORAL_TABLET | Freq: Every day | ORAL | 1 refills | Status: DC

## 2022-11-06 MED ORDER — LOSARTAN POTASSIUM 100 MG PO TABS: 100.0000 mg | ORAL_TABLET | Freq: Every day | ORAL | 1 refills | Status: DC

## 2022-11-06 MED ORDER — POTASSIUM CHLORIDE ER 10 MEQ PO TBCR: 20.0000 meq | EXTENDED_RELEASE_TABLET | Freq: Every day | ORAL | 1 refills | Status: DC

## 2022-11-06 NOTE — Progress Notes (Signed)
Date:  11/06/2022   Name:  Kelsey Terry   DOB:  1961-06-02   MRN:  [id number]   Chief Complaint: Prediabetes and Hypertension  Hypertension This is a chronic problem. The problem has been gradually improving since onset. Pertinent negatives include no anxiety, chest pain, headaches, palpitations, PND or shortness of breath. There are no associated agents to hypertension. Past treatments include diuretics and angiotensin blockers. The current treatment provides moderate improvement. There are no compliance problems.  Hypertensive end-organ damage includes CAD/MI. There is no history of angina, kidney disease, CVA, heart failure, left ventricular hypertrophy, PVD or retinopathy. There is no history of chronic renal disease, a hypertension causing med or renovascular disease.  Diabetes She presents for her follow-up diabetic visit. Diabetes type: prediabetes. Pertinent negatives for hypoglycemia include no dizziness, headaches or nervousness/anxiousness. Pertinent negatives for diabetes include no chest pain, no fatigue, no foot paresthesias, no polydipsia, no polyuria and no weakness. Symptoms are stable. Pertinent negatives for diabetic complications include no CVA, PVD or retinopathy.  Hyperlipidemia This is a chronic problem. The current episode started more than 1 year ago. The problem is controlled. Recent lipid tests were reviewed and are normal. She has no history of chronic renal disease. Pertinent negatives include no chest pain, myalgias or shortness of breath. Current antihyperlipidemic treatment includes statins. The current treatment provides moderate improvement of lipids.    Lab Results  Component Value Date   NA 140 03/01/2022   K 3.5 03/01/2022   CO2 28 03/01/2022   GLUCOSE 66 (L) 03/01/2022   BUN 14 03/01/2022   CREATININE 1.00 03/01/2022   CALCIUM 10.3 03/01/2022   EGFR 64 03/01/2022   GFRNONAA 60 (L) 06/27/2020   Lab Results  Component Value Date   CHOL 152  03/01/2022   HDL 57 03/01/2022   LDLCALC 84 03/01/2022   TRIG 53 03/01/2022   CHOLHDL 2.9 01/20/2020   Lab Results  Component Value Date   TSH 1.350 11/20/2018   Lab Results  Component Value Date   HGBA1C 5.9 (H) 07/05/2022   Lab Results  Component Value Date   WBC 5.3 06/27/2020   HGB 14.0 06/27/2020   HCT 44.5 06/27/2020   MCV 84.6 06/27/2020   PLT 210 06/27/2020   Lab Results  Component Value Date   ALT 17 03/01/2022   AST 24 03/01/2022   ALKPHOS 56 03/01/2022   BILITOT 0.7 03/01/2022   No results found for: "25OHVITD2", "25OHVITD3", "VD25OH"   Review of Systems  Constitutional: Negative.  Negative for chills, fatigue, fever and unexpected weight change.  HENT:  Negative for congestion, ear discharge, ear pain, rhinorrhea, sinus pressure, sneezing and sore throat.   Eyes:  Positive for visual disturbance.  Respiratory:  Negative for cough, shortness of breath, wheezing and stridor.   Cardiovascular:  Negative for chest pain, palpitations and PND.  Gastrointestinal:  Negative for abdominal pain, blood in stool, constipation, diarrhea and nausea.  Endocrine: Negative for polydipsia and polyuria.  Genitourinary:  Negative for dysuria, flank pain, frequency, hematuria, urgency and vaginal discharge.  Musculoskeletal:  Negative for arthralgias, back pain and myalgias.  Skin:  Negative for rash.  Neurological:  Negative for dizziness, weakness and headaches.  Hematological:  Negative for adenopathy. Does not bruise/bleed easily.  Psychiatric/Behavioral:  Negative for dysphoric mood. The patient is not nervous/anxious.     Patient Active Problem List   Diagnosis Date Noted   Cough 03/31/2022   Hyperlipidemia 03/06/2021   Familial hypercholesterolemia 08/23/2020   Acute  bilateral knee pain 01/13/2020   Health care maintenance 11/24/2019   H/O elevated lipids 11/04/2019   Lymphedema 08/26/2019   Chronic venous insufficiency 08/26/2019   Encounter for screening  colonoscopy    Polyp of sigmoid colon    Polyp of ascending colon    Lymphedema of left leg 06/16/2019   Prediabetes 06/16/2019   Abnormal laboratory test result 06/16/2019   Prediabetes 05/20/2019   Essential hypertension 05/19/2019   Abscess of left buttock 05/05/2019   Essential hypertension 05/05/2019   CAD (coronary artery disease) 05/05/2019   NSTEMI (non-ST elevated myocardial infarction) ([place]) 05/16/2018    Allergies  Allergen Reactions   Dorzolamide Other (See Comments)    Chest discomfort   Latanoprost Other (See Comments)    Chest discomfort   Timolol Other (See Comments)    Chest discomfort   Amoxicillin Itching and Other (See Comments)    Reaction: bump in vaginal region Has patient had a PCN reaction causing immediate rash, facial/tongue/throat swelling, SOB or lightheadedness with hypotension: No Has patient had a PCN reaction causing severe rash involving mucus membranes or skin necrosis: No Has patient had a PCN reaction that required hospitalization: No Has patient had a PCN reaction occurring within the last 10 years: No If all of the above answers are "NO", then may proceed with Cephalosporin use.    Erythromycin Hives and Rash    Past Surgical History:  Procedure Laterality Date   ABDOMINAL HYSTERECTOMY     CESAREAN SECTION     COLONOSCOPY WITH PROPOFOL N/A 08/13/2019   Procedure: COLONOSCOPY WITH BIOPSIES;  Surgeon: Lucilla Lame, MD;  Location: [facility];  Service: Endoscopy;  Laterality: N/A;   CORONARY STENT INTERVENTION N/A 05/16/2018   Procedure: CORONARY STENT INTERVENTION;  Surgeon: Wellington Hampshire, MD;  Location: [place] [facility];  Service: Cardiovascular;  Laterality: N/A;   CORONARY STENT INTERVENTION N/A 05/19/2018   Procedure: CORONARY STENT INTERVENTION;  Surgeon: Wellington Hampshire, MD;  Location: [place] [facility];  Service: Cardiovascular;  Laterality: N/A;   LEFT HEART CATH AND CORONARY ANGIOGRAPHY N/A 05/16/2018    Procedure: LEFT HEART CATH AND CORONARY ANGIOGRAPHY;  Surgeon: Wellington Hampshire, MD;  Location: [place] [facility];  Service: Cardiovascular;  Laterality: N/A;   PERICARDIOCENTESIS     POLYPECTOMY N/A 08/13/2019   Procedure: POLYPECTOMY;  Surgeon: Lucilla Lame, MD;  Location: [facility];  Service: Endoscopy;  Laterality: N/A;    Social History   Tobacco Use   Smoking status: Never   Smokeless tobacco: Never  Vaping Use   Vaping Use: Never used  Substance Use Topics   Alcohol use: Never   Drug use: Never     Medication list has been reviewed and updated.  Current Meds  Medication Sig   aspirin EC (ASPIRIN LOW DOSE) 81 MG tablet Take 1 tablet (81 mg total) by mouth daily. Swallow whole.   Blood Pressure KIT 1 kit by Does not apply route daily.   CALCIUM CITRATE-VITAMIN D PO Take 1 tablet by mouth 2 (two) times daily.   chlorthalidone (HYGROTON) 25 MG tablet Take 0.5 tablets (12.5 mg total) by mouth daily.   losartan (COZAAR) 100 MG tablet Take 1 tablet (100 mg total) by mouth daily.   metFORMIN (GLUCOPHAGE) 500 MG tablet Take 1 tablet (500 mg total) by mouth daily.   Multiple Vitamin (MULTIVITAMIN) tablet Take 1 tablet by mouth daily.   potassium chloride (KLOR-CON) 10 MEQ tablet Take 2 tablets (20 mEq total) by  mouth daily.   rosuvastatin (CRESTOR) 40 MG tablet Take 1 tablet (40 mg total) by mouth daily.       07/05/2022    3:15 PM 05/10/2022    4:16 PM 03/01/2022   10:04 AM 04/18/2021   10:36 AM  GAD 7 : Generalized Anxiety Score  Nervous, Anxious, on Edge 0 0 0 0  Control/stop worrying 0 0 0 0  Worry too much - different things 0 0 0 0  Trouble relaxing 0 0 0 0  Restless 0 0 0 0  Easily annoyed or irritable 0 0 0 0  Afraid - awful might happen 0 0 0 0  Total GAD 7 Score 0 0 0 0  Anxiety Difficulty Not difficult at all Not difficult at all Not difficult at all        07/05/2022    3:15 PM 05/10/2022    4:16 PM 03/01/2022   10:04 AM  Depression screen PHQ  2/9  Decreased Interest 0 0 0  Down, Depressed, Hopeless 0 0 0  PHQ - 2 Score 0 0 0  Altered sleeping 0 0 0  Tired, decreased energy 0 0 0  Change in appetite 0 0 0  Feeling bad or failure about yourself  0 0 0  Trouble concentrating 0 0 0  Moving slowly or fidgety/restless 0 0 0  Suicidal thoughts 0  0  PHQ-9 Score 0 0 0  Difficult doing work/chores Not difficult at all Not difficult at all Not difficult at all    BP Readings from Last 3 Encounters:  11/06/22 122/78  07/10/22 118/72  07/05/22 110/80    Physical Exam Vitals and nursing note reviewed. Exam conducted with a chaperone present.  Constitutional:      General: She is not in acute distress.    Appearance: She is not diaphoretic.  HENT:     Head: Normocephalic and atraumatic.     Right Ear: Tympanic membrane and external ear normal.     Left Ear: Tympanic membrane and external ear normal.     Nose: Nose normal.     Mouth/Throat:     Mouth: Mucous membranes are moist.  Eyes:     General:        Right eye: No discharge.        Left eye: No discharge.     Conjunctiva/sclera: Conjunctivae normal.     Pupils: Pupils are equal, round, and reactive to light.  Neck:     Thyroid: No thyromegaly.     Vascular: No JVD.  Cardiovascular:     Rate and Rhythm: Normal rate and regular rhythm.     Heart sounds: Normal heart sounds. No murmur heard.    No friction rub. No gallop.  Pulmonary:     Effort: Pulmonary effort is normal.     Breath sounds: Normal breath sounds. No wheezing, rhonchi or rales.  Abdominal:     General: Bowel sounds are normal.     Palpations: Abdomen is soft. There is no hepatomegaly, splenomegaly or mass.     Tenderness: There is no abdominal tenderness. There is no guarding.  Musculoskeletal:        General: Normal range of motion.     Cervical back: Normal range of motion and neck supple.  Lymphadenopathy:     Cervical: No cervical adenopathy.  Skin:    General: Skin is warm and dry.   Neurological:     Mental Status: She is alert.     Deep  Tendon Reflexes: Reflexes are normal and symmetric.     Wt Readings from Last 3 Encounters:  11/06/22 175 lb (79.4 kg)  07/10/22 176 lb 6.4 oz (80 kg)  07/05/22 178 lb (80.7 kg)    BP 122/78   Pulse 66   Ht _0  (1.6 m)   Wt 175 lb (79.4 kg)   BMI 31.00 kg/m   Assessment and Plan: 1. Essential hypertension Chronic.  Controlled.  Stable.  Blood pressure 122/78.  Continue losartan 100 mg once a day and chlorthalidone 12.5 mg once a day.  Will check renal function panel for electrolytes and GFR. - Renal Function Panel - losartan (COZAAR) 100 MG tablet; Take 1 tablet (100 mg total) by mouth daily.  Dispense: 90 tablet; Refill: 1 - potassium chloride (KLOR-CON) 10 MEQ tablet; Take 2 tablets (20 mEq total) by mouth daily.  Dispense: 180 tablet; Refill: 1  2. Prediabetes .  Stable.  Controlled.  Will check A1c for current status in the meantime we will likely continue metformin 500 mg once a day. - Hemoglobin A1c - metFORMIN (GLUCOPHAGE) 500 MG tablet; Take 1 tablet (500 mg total) by mouth daily.  Dispense: 90 tablet; Refill: 1  3. 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  Controlled.  Stable.  Continue rosuvastatin 40 mg once a day. - rosuvastatin (CRESTOR) 40 MG tablet; Take 1 tablet (40 mg total) by mouth daily.  Dispense: 90 tablet; Refill: 1  4. Need for hepatitis C screening test Will screen for hepatitis C. - Hepatitis C Antibody     Otilio Miu, MD

## 2022-11-07 LAB — RENAL FUNCTION PANEL
Albumin: 4.4 g/dL (ref 3.9–4.9)
BUN/Creatinine Ratio: 14 (ref 12–28)
BUN: 16 mg/dL (ref 8–27)
CO2: 26 mmol/L (ref 20–29)
Calcium: 9.9 mg/dL (ref 8.7–10.3)
Chloride: 102 mmol/L (ref 96–106)
Creatinine, Ser: 1.11 mg/dL — ABNORMAL HIGH (ref 0.57–1.00)
Glucose: 83 mg/dL (ref 70–99)
Phosphorus: 3.8 mg/dL (ref 3.0–4.3)
Potassium: 3.5 mmol/L (ref 3.5–5.2)
Sodium: 142 mmol/L (ref 134–144)
eGFR: 56 mL/min/{1.73_m2} — ABNORMAL LOW (ref >59)

## 2022-11-07 LAB — HEPATITIS C ANTIBODY: Hep C Virus Ab: NONREACTIVE

## 2022-11-07 LAB — HEMOGLOBIN A1C
Est. average glucose Bld gHb Est-mCnc: 126 mg/dL
Hgb A1c MFr Bld: 6 % — ABNORMAL HIGH (ref 4.8–5.6)

## 2022-11-15 DIAGNOSIS — Z1211 Encounter for screening for malignant neoplasm of colon: Secondary | ICD-10-CM | POA: Diagnosis not present

## 2022-12-02 ENCOUNTER — Other Ambulatory Visit: Payer: Self-pay | Admitting: Family Medicine

## 2022-12-02 DIAGNOSIS — I10 Essential (primary) hypertension: Secondary | ICD-10-CM

## 2022-12-13 ENCOUNTER — Ambulatory Visit (INDEPENDENT_AMBULATORY_CARE_PROVIDER_SITE_OTHER): Payer: 59 | Admitting: Vascular Surgery

## 2023-01-23 DIAGNOSIS — I252 Old myocardial infarction: Secondary | ICD-10-CM | POA: Diagnosis not present

## 2023-01-23 DIAGNOSIS — Z6830 Body mass index (BMI) 30.0-30.9, adult: Secondary | ICD-10-CM | POA: Diagnosis not present

## 2023-01-23 DIAGNOSIS — Z7982 Long term (current) use of aspirin: Secondary | ICD-10-CM | POA: Diagnosis not present

## 2023-01-23 DIAGNOSIS — H409 Unspecified glaucoma: Secondary | ICD-10-CM | POA: Diagnosis not present

## 2023-01-23 DIAGNOSIS — R600 Localized edema: Secondary | ICD-10-CM | POA: Diagnosis not present

## 2023-01-23 DIAGNOSIS — E669 Obesity, unspecified: Secondary | ICD-10-CM | POA: Diagnosis not present

## 2023-01-23 DIAGNOSIS — E876 Hypokalemia: Secondary | ICD-10-CM | POA: Diagnosis not present

## 2023-01-23 DIAGNOSIS — Z7984 Long term (current) use of oral hypoglycemic drugs: Secondary | ICD-10-CM | POA: Diagnosis not present

## 2023-01-23 DIAGNOSIS — E119 Type 2 diabetes mellitus without complications: Secondary | ICD-10-CM | POA: Diagnosis not present

## 2023-01-23 DIAGNOSIS — E785 Hyperlipidemia, unspecified: Secondary | ICD-10-CM | POA: Diagnosis not present

## 2023-01-23 DIAGNOSIS — I10 Essential (primary) hypertension: Secondary | ICD-10-CM | POA: Diagnosis not present

## 2023-01-23 DIAGNOSIS — Z880 Allergy status to penicillin: Secondary | ICD-10-CM | POA: Diagnosis not present

## 2023-02-12 NOTE — Progress Notes (Unsigned)
MRN : [id number]  Kelsey Terry is a 62 y.o. (May 27, 1961) female who presents with chief complaint of legs swell.  History of Present Illness:   The patient returns to the office for followup evaluation regarding leg swelling.  The swelling has persisted but with the lymph pump is much better controlled. The pain associated with swelling is essentially eliminated. There have not been any interval development of a ulcerations or wounds.  She states she uses the pump atleast once every day.   The patient denies problems with the pump, noting it is working well and the leggings are in good condition.   Since the previous visit the patient has been wearing graduated compression stockings and using the lymph pump on a routine basis and  has noted significant improvement in the lymphedema.    Patient stated the lymph pump has been a very positive factor in her care.    No outpatient medications have been marked as taking for the 02/14/23 encounter (Appointment) with Delana Meyer, Dolores Lory, MD.    Past Medical History:  Diagnosis Date   Allergies    Asthma    Blood dyscrasia    blood clot in right leg   CAD (coronary artery disease) 05/05/2019   CAD in native artery    a. NSTEMI 05/16/18: LHC 05/16/18 - ost-pLAD 95% s/p PCI/DES, mLAD 30%, ostD1 70%, ost-pLCx 30%, mRCA 90%, dRCA 20%; b. staged PCI/DES to [facility] 7/1   Complication of anesthesia    patient stated she had "some kind of reaction to anesthesia, "thought she was having a stroke" during her second cath procedure.   Diabetes mellitus without complication ([place])    pre-diabetes   Diastolic dysfunction    a. TTE 05/16/18: EF 55-60%, no RWMA, Gr1DD, trivial AI, calcified mitral annulus, mild MR, mildly dilated LA   Dyspnea    with activity   Hypertension    Lymphedema    LLE   Pericarditis    a. ~ 2012 in [place], [place] s/p pericardiocentesis    Wears contact lenses     Past Surgical History:  Procedure Laterality Date   ABDOMINAL  HYSTERECTOMY     CESAREAN SECTION     COLONOSCOPY WITH PROPOFOL N/A 08/13/2019   Procedure: COLONOSCOPY WITH BIOPSIES;  Surgeon: Lucilla Lame, MD;  Location: [facility];  Service: Endoscopy;  Laterality: N/A;   CORONARY STENT INTERVENTION N/A 05/16/2018   Procedure: CORONARY STENT INTERVENTION;  Surgeon: Wellington Hampshire, MD;  Location: [facility];  Service: Cardiovascular;  Laterality: N/A;   CORONARY STENT INTERVENTION N/A 05/19/2018   Procedure: CORONARY STENT INTERVENTION;  Surgeon: Wellington Hampshire, MD;  Location: [facility];  Service: Cardiovascular;  Laterality: N/A;   LEFT HEART CATH AND CORONARY ANGIOGRAPHY N/A 05/16/2018   Procedure: LEFT HEART CATH AND CORONARY ANGIOGRAPHY;  Surgeon: Wellington Hampshire, MD;  Location: [facility];  Service: Cardiovascular;  Laterality: N/A;   PERICARDIOCENTESIS     POLYPECTOMY N/A 08/13/2019   Procedure: POLYPECTOMY;  Surgeon: Lucilla Lame, MD;  Location: [facility];  Service: Endoscopy;  Laterality: N/A;    Social History Social History   Tobacco Use   Smoking status: Never   Smokeless tobacco: Never  Vaping Use   Vaping Use: Never used  Substance Use Topics   Alcohol use: Never   Drug use: Never    Family History Family History  Problem Relation Age of Onset   Hypertension Mother  Pancreatic cancer Father    Diabetes Brother    Breast cancer Neg Hx     Allergies  Allergen Reactions   Dorzolamide Other (See Comments)    Chest discomfort   Latanoprost Other (See Comments)    Chest discomfort   Timolol Other (See Comments)    Chest discomfort   Amoxicillin Itching and Other (See Comments)    Reaction: bump in vaginal region Has patient had a PCN reaction causing immediate rash, facial/tongue/throat swelling, SOB or lightheadedness with hypotension: No Has patient had a PCN reaction causing severe rash involving mucus membranes or skin necrosis: No Has patient had a PCN reaction  that required hospitalization: No Has patient had a PCN reaction occurring within the last 10 years: No If all of the above answers are "NO", then may proceed with Cephalosporin use.    Erythromycin Hives and Rash     REVIEW OF SYSTEMS (Negative unless checked)  Constitutional: [] Weight loss  [] Fever  [] Chills Cardiac: [] Chest pain   [] Chest pressure   [] Palpitations   [] Shortness of breath when laying flat   [] Shortness of breath with exertion. Vascular:  [] Pain in legs with walking   [x] Pain in legs with standing  [] History of DVT   [] Phlebitis   [x] Swelling in legs   [] Varicose veins   [] Non-healing ulcers Pulmonary:   [] Uses home oxygen   [] Productive cough   [] Hemoptysis   [] Wheeze  [] COPD   [] Asthma Neurologic:  [] Dizziness   [] Seizures   [] History of stroke   [] History of TIA  [] Aphasia   [] Vissual changes   [] Weakness or numbness in arm   [] Weakness or numbness in leg Musculoskeletal:   [] Joint swelling   [] Joint pain   [] Low back pain Hematologic:  [] Easy bruising  [] Easy bleeding   [] Hypercoagulable state   [] Anemic Gastrointestinal:  [] Diarrhea   [] Vomiting  [] Gastroesophageal reflux/heartburn   [] Difficulty swallowing. Genitourinary:  [] Chronic kidney disease   [] Difficult urination  [] Frequent urination   [] Blood in urine Skin:  [] Rashes   [] Ulcers  Psychological:  [] History of anxiety   []  History of major depression.  Physical Examination  There were no vitals filed for this visit. There is no height or weight on file to calculate BMI. Gen: WD/WN, NAD Head: [place]/AT, No temporalis wasting.  Ear/Nose/Throat: Hearing grossly intact, nares w/o erythema or drainage, pinna without lesions Eyes: PER, EOMI, sclera nonicteric.  Neck: Supple, no gross masses.  No JVD.  Pulmonary:  Good air movement, no audible wheezing, no use of accessory muscles.  Cardiac: RRR, precordium not hyperdynamic. Vascular:  scattered varicosities present bilaterally.  Mild venous stasis changes to the  legs bilaterally.  3-4+ soft pitting edema, CEAP [id number]  Vessel Right Left  Radial Palpable Palpable  Gastrointestinal: soft, non-distended. No guarding/no peritoneal signs.  Musculoskeletal: M/S 5/5 throughout.  No deformity.  Neurologic: CN 2-12 intact. Pain and light touch intact in extremities.  Symmetrical.  Speech is fluent. Motor exam as listed above. Psychiatric: Judgment intact, Mood & affect appropriate for pt's clinical situation. Dermatologic: Venous rashes no ulcers noted.  No changes consistent with cellulitis. Lymph : No lichenification or skin changes of chronic lymphedema.  CBC Lab Results  Component Value Date   WBC 5.3 06/27/2020   HGB 14.0 06/27/2020   HCT 44.5 06/27/2020   MCV 84.6 06/27/2020   PLT 210 06/27/2020    BMET    Component Value Date/Time   NA 142 11/06/2022 0911   K 3.5 11/06/2022 0911   CL  102 11/06/2022 0911   CO2 26 11/06/2022 0911   GLUCOSE 83 11/06/2022 0911   GLUCOSE 77 06/27/2020 1931   BUN 16 11/06/2022 0911   CREATININE 1.11 (H) 11/06/2022 0911   CALCIUM 9.9 11/06/2022 0911   GFRNONAA 60 (L) 06/27/2020 1931   GFRAA >60 06/27/2020 1931   CrCl cannot be calculated (Patient's most recent lab result is older than the maximum 21 days allowed.).  COAG Lab Results  Component Value Date   INR 0.94 05/16/2018    Radiology No results found.   Assessment/Plan There are no diagnoses linked to this encounter.   Hortencia Pilar, MD  02/12/2023 3:18 PM

## 2023-02-14 ENCOUNTER — Ambulatory Visit (INDEPENDENT_AMBULATORY_CARE_PROVIDER_SITE_OTHER): Payer: 59 | Admitting: Vascular Surgery

## 2023-02-14 ENCOUNTER — Encounter (INDEPENDENT_AMBULATORY_CARE_PROVIDER_SITE_OTHER): Payer: Self-pay | Admitting: Vascular Surgery

## 2023-02-14 VITALS — BP 134/78 | HR 61 | Resp 18 | Ht 63.0 in | Wt 174.4 lb

## 2023-02-14 DIAGNOSIS — I10 Essential (primary) hypertension: Secondary | ICD-10-CM | POA: Diagnosis not present

## 2023-02-14 DIAGNOSIS — E782 Mixed hyperlipidemia: Secondary | ICD-10-CM | POA: Diagnosis not present

## 2023-02-14 DIAGNOSIS — I890 Lymphedema, not elsewhere classified: Secondary | ICD-10-CM | POA: Diagnosis not present

## 2023-02-14 DIAGNOSIS — I2510 Atherosclerotic heart disease of native coronary artery without angina pectoris: Secondary | ICD-10-CM

## 2023-02-14 DIAGNOSIS — I872 Venous insufficiency (chronic) (peripheral): Secondary | ICD-10-CM | POA: Diagnosis not present

## 2023-03-04 ENCOUNTER — Telehealth: Payer: Self-pay | Admitting: Family Medicine

## 2023-03-04 NOTE — Telephone Encounter (Signed)
Referral Request - Has patient seen PCP for this complaint? yes *If NO, is insurance requiring patient see PCP for this issue before PCP can refer them? Referral for which specialty: treatment of lymhadema Preferred provider/office: ? Reason for referral: swellling from lmyphadema, asking for treatment with massages and wraps and bandages

## 2023-03-07 ENCOUNTER — Encounter: Payer: Self-pay | Admitting: Family Medicine

## 2023-03-08 ENCOUNTER — Telehealth (INDEPENDENT_AMBULATORY_CARE_PROVIDER_SITE_OTHER): Payer: Self-pay

## 2023-03-08 ENCOUNTER — Other Ambulatory Visit (INDEPENDENT_AMBULATORY_CARE_PROVIDER_SITE_OTHER): Payer: Self-pay | Admitting: Vascular Surgery

## 2023-03-08 DIAGNOSIS — I890 Lymphedema, not elsewhere classified: Secondary | ICD-10-CM

## 2023-03-08 NOTE — Telephone Encounter (Signed)
Patient is informed.  You have a great weekend as well!

## 2023-03-08 NOTE — Telephone Encounter (Signed)
Kelsey Terry called about her referral, she would like to go to [facility] for the lymphedema therapy here [facility]. She was told her PCP Dr. Yetta Barre  would have to do the referral but, Dr. Yetta Barre told her that Dr. Lorretta Harp would have to refer her because he has been the one seeing her.   Please advise

## 2023-03-08 NOTE — Progress Notes (Signed)
Patient would like to be evaluated for lymphedema massage

## 2023-03-14 ENCOUNTER — Encounter: Payer: Self-pay | Admitting: Occupational Therapy

## 2023-03-14 ENCOUNTER — Ambulatory Visit: Payer: 59 | Attending: Vascular Surgery | Admitting: Occupational Therapy

## 2023-03-14 DIAGNOSIS — I890 Lymphedema, not elsewhere classified: Secondary | ICD-10-CM | POA: Insufficient documentation

## 2023-03-14 NOTE — Therapy (Signed)
OUTPATIENT OCCUPATIONAL THERAPY EVALUATION LOWER EXTREMITY LYMPHEDEMA  Patient Name: Kelsey Terry MRN: [id number] DOB:[birth year], 62 y.o., female Today's Date: 03/14/2023  END OF SESSION:   Past Medical History:  Diagnosis Date   Allergies    Asthma    Blood dyscrasia    blood clot in right leg   CAD (coronary artery disease) 05/05/2019   CAD in native artery    a. NSTEMI 05/16/18: LHC 05/16/18 - ost-pLAD 95% s/p PCI/DES, mLAD 30%, ostD1 70%, ost-pLCx 30%, mRCA 90%, dRCA 20%; b. staged PCI/DES to [place] 7/1   Complication of anesthesia    patient stated she had "some kind of reaction to anesthesia, "thought she was having a stroke" during her second cath procedure.   Diabetes mellitus without complication (HCC)    pre-diabetes   Diastolic dysfunction    a. TTE 05/16/18: EF 55-60%, no RWMA, Gr1DD, trivial AI, calcified mitral annulus, mild MR, mildly dilated LA   Dyspnea    with activity   Hypertension    Lymphedema    LLE   Pericarditis    a. ~ 2012 in [place], [place] s/p pericardiocentesis    Wears contact lenses    Past Surgical History:  Procedure Laterality Date   ABDOMINAL HYSTERECTOMY     CESAREAN SECTION     COLONOSCOPY WITH PROPOFOL N/A 08/13/2019   Procedure: COLONOSCOPY WITH BIOPSIES;  Surgeon: Midge Minium, MD;  Location: [facility];  Service: Endoscopy;  Laterality: N/A;   CORONARY STENT INTERVENTION N/A 05/16/2018   Procedure: CORONARY STENT INTERVENTION;  Surgeon: Iran Ouch, MD;  Location: [facility];  Service: Cardiovascular;  Laterality: N/A;   CORONARY STENT INTERVENTION N/A 05/19/2018   Procedure: CORONARY STENT INTERVENTION;  Surgeon: Iran Ouch, MD;  Location: [facility];  Service: Cardiovascular;  Laterality: N/A;   LEFT HEART CATH AND CORONARY ANGIOGRAPHY N/A 05/16/2018   Procedure: LEFT HEART CATH AND CORONARY ANGIOGRAPHY;  Surgeon: Iran Ouch, MD;  Location: [facility];  Service: Cardiovascular;   Laterality: N/A;   PERICARDIOCENTESIS     POLYPECTOMY N/A 08/13/2019   Procedure: POLYPECTOMY;  Surgeon: Midge Minium, MD;  Location: [facility];  Service: Endoscopy;  Laterality: N/A;   Patient Active Problem List   Diagnosis Date Noted   Cough 03/31/2022   Hyperlipidemia 03/06/2021   Familial hypercholesterolemia 08/23/2020   Acute bilateral knee pain 01/13/2020   Health care maintenance 11/24/2019   H/O elevated lipids 11/04/2019   Lymphedema 08/26/2019   Chronic venous insufficiency 08/26/2019   Encounter for screening colonoscopy    Polyp of sigmoid colon    Polyp of ascending colon    Lymphedema of left leg 06/16/2019   Prediabetes 06/16/2019   Abnormal laboratory test result 06/16/2019   Prediabetes 05/20/2019   Essential hypertension 05/19/2019   Abscess of left buttock 05/05/2019   Essential hypertension 05/05/2019   CAD (coronary artery disease) 05/05/2019   NSTEMI (non-ST elevated myocardial infarction) 05/16/2018    PCP: Duanne Limerick, MD  REFERRING PROVIDER: Levora Dredge, MD  REFERRING DIAG: I89.0  THERAPY DIAG:  Lymphedema, not elsewhere classified  Rationale for Evaluation and Treatment: Rehabilitation  ONSET DATE: 03/14/23  SUBJECTIVE:  SUBJECTIVE STATEMENT: Kelsey Terry is referred to Occupational Therapy by Renford Dills, MD, for evaluation and treatment of BLE lymphedema. Kelsey Terry reports onset of BLE swelling, L>R, at about age 51  during puberty. She can recall no precipitating events, or circumstances. She reports several of her mother's female siblings had chronic leg swelling like hers. None of her children demonstrate symptoms. Pt tells me that she underwent formal lymphedema treatment > 20 years ago when she had manual lymphatic drainage (MLD) and  bandaging. She wore custom compression pantyhose in the past, but she didn't like them. She currently uses off-the-shelf, non-medical grade, Hanes  support pantyhose daily. She reports she has a Biotab vaso pneumatic compression "pump" that a DME vendor gave to her. The "pump" has knee length garments and appears to be a basic device providing retrograde, squeeze and release stimulation. Basic vaso pneumatic  compression devices do not follow lymphatic anatomy or address fibrosis. Pt's goals for OT lymphedema care are, "to be able to do something better to take care of my legs, to learn about any new management technology, and to keep the lymphedema from getting worse."  PERTINENT HISTORY: LE, CVI, HTN, Prediabetes, CAD, s/p NSTEMI 04/2028  PAIN: Are you having lymphedema -related  pain? No  PRECAUTIONS: Other: LYMPHEDEMA PRECAUTIONS: CARDIAC, DM- skin  WEIGHT BEARING RESTRICTIONS: No  FALLS: Has patient fallen in last 6 months? No  LIVING ENVIRONMENT: Lives with: lives with their family and lives with their son Lives in: House/apartment Stairs: No; External: 2 flights. 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  LEISURE: movies, shopping, family, walking track at the park  HAND DOMINANCE: right   PRIOR LEVEL OF FUNCTION: Independent  PATIENT GOALS: to be able to do something better to take care of my legs; to learn about any new management technology; to keep the lymphedema from getting worse."   OBJECTIVE:  COGNITION:  Overall cognitive status: Within functional limits for tasks assessed   OBSERVATIONS / OTHER ASSESSMENTS:  FOTO functional outcome score: Intake 03/14/23: 81%  Lymphedema Life Impact Scale (LLIS) Intake 03/14/23: 20.59% (The extent to which lymphedema -related problems impacted your life in the past week)  Moderate, Stage  II, Primary, Bilateral Lower Extremity  Lymphedema, L>R (Lymphedema Praecox)  Skin  Description Hyper-Keratosis Peau' de Orange Shiny Tight Fibrotic/ Indurated Fatty Doughy Spongy/ "boggy"    x  x L>R x  x   Skin dry Flaky WNL Macerated   mildly      Color Redness Varicosities Blanching Hemosiderin Stain Mottled   x x   x x   Odor Malodorous Yeast Fungal infection  WNL      x   Temperature Warm Cool wnl    x     Pitting Edema   1+ 2+ 3+ 4+ Non-pitting   X R shin         Girth Symmetrical Asymmetrical                   Distribution    L>R toes to groin    Stemmer Sign Positive Negative   +    Lymphorrhea History Of:  Present Absent     x    Wounds History Of Present Absent Venous Arterial Pressure Sheer     x        Signs of Infection Redness/Erythema Warmth Rash Acute Swelling Drainage Borders  Sensation Light Touch Deep pressure Hypersensitivity   In Tact Impaired In Tact Impaired Absent Impaired   x  x  x     Nails WNL   Fungus nail dystrophy    x x   Hair Growth Symmetrical Asymmetrical       Skin Creases Base of toes  Ankles   Base of Fingers knees       Abdominal pannus Thigh Lobules  Face/neck   x x  x       BLE COMPARATIVE LIMB VOLUMETRICS Intake : TBA OT Rx visit 1  LANDMARK RIGHT    R LEG (A-D) N/A  R THIGH (E-G) ml  R FULL LIMB (A-G) ml  Limb Volume differential (LVD)  %  Volume change since initial %  Volume change overall V  (Blank rows = not tested)  LANDMARK LEFT    L LEG (A-D) N/A  L THIGH (E-G) ml  L  FULL LIMB (A-G) ml  Limb Volume differential (LVD)  %  Volume change since initial %  Volume change overall %  (Blank rows = not tested)   POSTURE: WNL  LE AROM:  Mildly limited at hips, knees, ankles and toes 2/2 decreased skin excursion (fibrosis) and skin approximation  LE MUSCLE STRENGTH: WFL   INFECTIONS: denies hx  NON-PRESSURE-RELATED WOUNDS: denies  GAIT: Distance walked: >600 ' Assistive device utilized:  None Level of assistance: Complete Independence Comments: slowed pace; no LOB  TODAY'S TREATMENT:                                                                                                                                         DATE: Initial evaluation 03/14/23 Pt edu  PATIENT EDUCATION:  Education details: Provided basic level education regarding lymphatic structure and function, etiology, onset patterns, stages of progression, and prevention to limit infection risk, worsening condition and further functional decline. Pt edu for aught interaction between blood circulatory system and lymphatic circulation.Discussed  impact of gravity and co-morbidities on lymphatic function. Outlined Complete Decongestive Therapy (CDT)  as standard of care and provided in depth information regarding 4 primary components of Intensive and Self Management Phases, including Manual Lymph Drainage (MLD), compression wrapping and garments, skin care, and therapeutic exercise. Homero Fellers discussion with re need for frequent attendance and high burden of care when caregiver is needed, impact of co morbidities. We discussed  the chronic, progressive nature of lymphedema and Importance of daily, ongoing LE self-care essential for limiting progression and infection risk.  Person educated: Patient Education method: Explanation, Demonstration, and Handouts Education comprehension: verbalized understanding, returned demonstration, and needs further education  LE SELF-CARE HOME PROGRAM: Lymphedema Self-Care Home Program: BLE Lymphatic Pumping There ex Skin care to limit infection risk Compression Intensive stage compression: multilayer short stretch wraps with gradient techniques. One limb at a time. Self-management Phase Compression: Custom, flat knit, BLE compression knee highs. Consider Elvarex classic  for daytime and Jobst Relax for HOS Simple self-MLD. Consider advanced Flexitouch device to assist w long term  self-management at home   Custom-made gradient compression garments and HOS devices are medically necessary in this case because they are uniquely sized and shaped to fit the exact dimensions of the affected extremities with deformities, and to provide accurate and consistent gradient compression and containment, essential to optimally managing this patient's symptoms of chronic, progressive lymphedema. Multiple custom compression garments are needed for optimal hygiene to limit infection risk. Custom compression garments should be replaced q 3-6 months When worn consistently for optimal lipo-lymphedema self-management over time.  ASSESSMENT:  CLINICAL IMPRESSION:Kelsey Terry is a 61 y.o. female presenting with moderate, stage II, LLE/LLQ, primary lymphedema praecox with onset at age 15. Exacerbating co-morbidities/ conditions include CVI, HTN and hx of recurrent infections. Lymphedema is distributed from toes to groin and inferior abdomen. Distal fibrosis is dense and limits skin  flexibility and AROM.  At present Pt performs conservative self-care measures including elevation, walking and using  an incorrect vaso pneumatic  compression "pump" providing retrograde knee high compression. The device was given to her by a DME vendor, not prescribed by an MD. She wears inadequate off-the-shelf support pantyhose that provide inadequate compression and containment of protein rich swelling. Lymphedema and associated pain limits functional performance in all occupational domains, including functional ambulation and mobility, basic and instrumental ADLs  productive activities, leisure pursuits, social participation and body image and mood. BLE lymphedema contributes to elevated infection risk and increased falls risk due to body asymmetry. Kelsey. Terry will benefit from skilled OT for Complete Decongestive Therapy (CDT) the current gold standard of manual lymphedema care. CDT includes manual lymphatic drainage (MLD), skin  care to limit infection risk and increase skin excursion, lymphatic pumping exercise, and during the Intensive Phase, multilayer, gradient compression bandaging to reduce limb volume. Once limb volume reduction reaches a clinical plateau appropriate garments that provide appropriate compression and containment are recommended and fitted. Throughout the treatment course Pt/ caregiver will learn lymphedema prevention strategies and precautions, and learn to perform all LE self-care home program components. Without skilled OT for lymphedema care, chronic, progressive lymphedema will worsen and further functional decline is expected.   OBJECTIVE IMPAIRMENTS: decreased knowledge of condition, decreased knowledge of use of DME, decreased mobility, increased edema, pain, and chronic lower extremity swelling and associated pain, lymphedema related skin changes ( decreased excursion, increased skin approximation) increased infection risk  ACTIVITY LIMITATIONS: sitting tolerance, standing tolerance, and functional ambulation. Basic and Instrumental ADLS ( fitting shoes, socks and lower body clothing, grooming nails, inspecting skin, performing skin care, driving, shopping, housework) leisure pursuits (walking for exercise), productive activities ( caring for others, work duties),   PARTICIPATION LIMITATIONS:  negative body image limits clothing selection and social participation  PERSONAL FACTORS: Time since onset of onset/ level of progression; exacerbating co morbidities: : HTN, CVI   REHAB POTENTIAL: Good  EVALUATION COMPLEXITY: Moderate   GOALS: Goals reviewed with patient? Yes  SHORT TERM GOALS: Target date: 4th OT Rx visit  Pt will demonstrate understanding of lymphedema precautions and prevention strategies with modified independence using a printed reference to identify at least 5 precautions and discussing how s/he may implement them into daily life to reduce risk of progression with extra  time. Baseline:Max A Goal status: INITIAL  2.  Pt will be able to apply multilayer, thigh length, gradient, compression wraps to one leg at a time with modified assistance (extra time) to decrease  limb volume, to limit infection risk, and to limit lymphedema progression.  Baseline: Dependent Goal status: INITIAL  LONG TERM GOALS: Target date: 06/12/23  Given this patient's Intake score of 81/100% on the functional outcomes FOTO tool, patient will experience an increase in function of 3 points to improve basic and instrumental ADLs performance, including lymphedema self-care.  Baseline: 81% Goal status: INITIAL  2.  Given this patient's Intake score of 20.59 % on the Lymphedema Life Impact Scale (LLIS), patient will experience a reduction of at least 5 points in her perceived level of functional impairment resulting from lymphedema to improve functional performance and quality of life (QOL). Baseline: 20.59% Goal status: INITIAL  3.  During Intensive phase CDT Pt will achieve at least 85% compliance with all lymphedema self-care home program components, including daily skin care, compression wraps and /or garments, simple self MLD and lymphatic pumping therex to habituate LE self care protocol  into ADLs for optimal LE self-management over time. Baseline: Dependent Goal status: INITIAL  4.  Pt will achieve at least a 10% volume reduction in the BLE to return limb to typical size and shape, to limit infection risk and LE progression, to decrease pain, to improve function. Baseline: Dependent Goal status: INITIAL  5.  STG: Pt will obtain proper compression garments/devices and achieve modified independence (extra time + assistive devices) with donning/doffing to optimize limb volume reductions and limit LE  progression over time. Baseline:  Goal status: INITIAL  PLAN:  PT FREQUENCY: 2x/weekand PRN  PT DURATION: other: and PRN  PLANNED INTERVENTIONS: Complete Decongestive Therapy  (CDT): Therapeutic lymphatic pumping exercises, skin care simultaneously w MLD to limit infection risk, Manual lymph drainage (MLD), During Intensive Phase- multilayer Compression bandaging; during self-management phase formulate appropriate compression garments and devices, complete anatomical measurements, fitting and assessment t of garments; Consider trial of Flexitouch ADVANCED sequential compression device as replacement for incorrect basic vaso pneumatic device. Patient/Family education, DME instructions,    PLAN FOR NEXT SESSION:  BLE comparative limb volumetrics LLE knee length multilayer compression wrap Pt edu for LE self care home program  Loel Dubonnet, Kelsey, OTR/L, CLT-LANA 03/14/23 8:56 AM

## 2023-03-21 ENCOUNTER — Encounter: Payer: Self-pay | Admitting: Occupational Therapy

## 2023-03-21 ENCOUNTER — Ambulatory Visit: Payer: 59 | Attending: Vascular Surgery | Admitting: Occupational Therapy

## 2023-03-21 DIAGNOSIS — I890 Lymphedema, not elsewhere classified: Secondary | ICD-10-CM | POA: Diagnosis not present

## 2023-03-21 NOTE — Therapy (Signed)
OUTPATIENT OCCUPATIONAL THERAPY TREATMENT LOWER EXTREMITY LYMPHEDEMA  Patient Name: Kelsey Terry MRN: [id number] DOB:08-14-1961, 62 y.o., female Today's Date: 03/22/2023  END OF SESSION:   Past Medical History:  Diagnosis Date   Allergies    Asthma    Blood dyscrasia    blood clot in right leg   CAD (coronary artery disease) 05/05/2019   CAD in native artery    a. NSTEMI 05/16/18: LHC 05/16/18 - ost-pLAD 95% s/p PCI/DES, mLAD 30%, ostD1 70%, ost-pLCx 30%, mRCA 90%, dRCA 20%; b. staged PCI/DES to [facility] 7/1   Complication of anesthesia    patient stated she had "some kind of reaction to anesthesia, "thought she was having a stroke" during her second cath procedure.   Diabetes mellitus without complication (HCC)    pre-diabetes   Diastolic dysfunction    a. TTE 05/16/18: EF 55-60%, no RWMA, Gr1DD, trivial AI, calcified mitral annulus, mild MR, mildly dilated LA   Dyspnea    with activity   Hypertension    Lymphedema    LLE   Pericarditis    a. ~ 2012 in [place], [place] s/p pericardiocentesis    Wears contact lenses    Past Surgical History:  Procedure Laterality Date   ABDOMINAL HYSTERECTOMY     CESAREAN SECTION     COLONOSCOPY WITH PROPOFOL N/A 08/13/2019   Procedure: COLONOSCOPY WITH BIOPSIES;  Surgeon: Midge Minium, MD;  Location: [facility];  Service: Endoscopy;  Laterality: N/A;   CORONARY STENT INTERVENTION N/A 05/16/2018   Procedure: CORONARY STENT INTERVENTION;  Surgeon: Iran Ouch, MD;  Location: [facility];  Service: Cardiovascular;  Laterality: N/A;   CORONARY STENT INTERVENTION N/A 05/19/2018   Procedure: CORONARY STENT INTERVENTION;  Surgeon: Iran Ouch, MD;  Location: [facility];  Service: Cardiovascular;  Laterality: N/A;   LEFT HEART CATH AND CORONARY ANGIOGRAPHY N/A 05/16/2018   Procedure: LEFT HEART CATH AND CORONARY ANGIOGRAPHY;  Surgeon: Iran Ouch, MD;  Location: [facility];  Service: Cardiovascular;   Laterality: N/A;   PERICARDIOCENTESIS     POLYPECTOMY N/A 08/13/2019   Procedure: POLYPECTOMY;  Surgeon: Midge Minium, MD;  Location: [facility];  Service: Endoscopy;  Laterality: N/A;   Patient Active Problem List   Diagnosis Date Noted   Cough 03/31/2022   Hyperlipidemia 03/06/2021   Familial hypercholesterolemia 08/23/2020   Acute bilateral knee pain 01/13/2020   Health care maintenance 11/24/2019   H/O elevated lipids 11/04/2019   Lymphedema 08/26/2019   Chronic venous insufficiency 08/26/2019   Encounter for screening colonoscopy    Polyp of sigmoid colon    Polyp of ascending colon    Lymphedema of left leg 06/16/2019   Prediabetes 06/16/2019   Abnormal laboratory test result 06/16/2019   Prediabetes 05/20/2019   Essential hypertension 05/19/2019   Abscess of left buttock 05/05/2019   Essential hypertension 05/05/2019   CAD (coronary artery disease) 05/05/2019   NSTEMI (non-ST elevated myocardial infarction) (HCC) 05/16/2018    PCP: Duanne Limerick, MD  REFERRING PROVIDER: Levora Dredge, MD  REFERRING DIAG: I89.0  THERAPY DIAG:  Lymphedema, not elsewhere classified  Rationale for Evaluation and Treatment: Rehabilitation  ONSET DATE: 03/14/23  SUBJECTIVE:  SUBJECTIVE STATEMENT: Ms Kelsey Terry presents for first OT visit to address BLE lymphedema. Pt denies LE-related leg pain and has no new complaints.   PERTINENT HISTORY: LE, CVI, HTN, Prediabetes, CAD, s/p NSTEMI 04/2028  PAIN: Are you having lymphedema -related  pain? No  PRECAUTIONS: Other: LYMPHEDEMA PRECAUTIONS: CARDIAC, DM- skin  FALLS: Has patient fallen since last visit? No  HAND DOMINANCE: right   PRIOR LEVEL OF FUNCTION: Independent  PATIENT GOALS: to be able to do something better to take care of my legs; to  learn about any new management technology; to keep the lymphedema from getting worse."   OBJECTIVE:  OBSERVATIONS / OTHER ASSESSMENTS: Moderate, Stage  II, Primary, Bilateral Lower Extremity Lymphedema, L>R (Lymphedema Praecox)  FOTO functional outcome score: Intake 03/14/23: 81%  Lymphedema Life Impact Scale (LLIS) Intake 03/14/23: 20.59% (The extent to which lymphedema -related problems impacted your life in the past week)    BLE COMPARATIVE LIMB VOLUMETRICS Intake Visit 1 03/21/23  LANDMARK RIGHT    R LEG (A-D) 3627.8 ml  R THIGH (E-G) 6402.0 ml  R FULL LIMB (A-G) 10029.8 ml  Limb Volume differential (LVD)  %  Volume change since initial %  Volume change overall V  (Blank rows = not tested)  LANDMARK LEFT    L LEG (A-D) 4875.0 ml  L THIGH (E-G) 6129.6 ml  L  FULL LIMB (A-G) 11004.6 ml  Limb Volume differential (LVD)  Leg LVD = 25.6%, L>R Thigh LVD = 4.41%, R (dominant)>L Full Limb LVD = 8.85%, L>R%  Volume change since initial %  Volume change overall %  (Blank rows = not tested)    TODAY'S TREATMENT:  1.BLE comparative volumetrics-initial 2. Multilayer compression wraps - knee length- to LLE                                                                                                                                       PATIENT EDUCATION:  Education details: Provided Pt/ family education regarding lymphatic structure and function, lymphedema etiology, onset patterns and stages of progression. Taught interaction between blood circulatory system and lymphatic circulation.Discussed  impact of gravity and co-morbidities on lymphatic function. Outlined Complete Decongestive Therapy (CDT)  as standard of care and provided in depth information regarding 4 primary components of Intensive and Self Management Phases, including Manual Lymph Drainage (MLD), compression wrapping and garments, skin care, and therapeutic exercise. Homero Fellers discussion with re need for frequent attendance  and high burden of care when caregiver is needed, impact of co morbidities. We discussed  the chronic, progressive nature of lymphedema and Importance of daily, ongoing LE self-care essential for limiting progression and infection risk. Discussed the demanding nature of CDT, high burden of care between visits and throughout self-management phase,  and importance of high level of compliance with all home program components for optimal outcome.  Person educated: Patient Education method: Explanation, Demonstration, and Handouts Education comprehension: verbalized  understanding, returned demonstration, and needs further education  LE SELF-CARE HOME PROGRAM: Lymphedema Self-Care Home Program: BLE Lymphatic Pumping There ex Skin care to limit infection risk Compression Intensive stage compression: multilayer short stretch wraps with gradient techniques. One limb at a time. Self-management Phase Compression: Custom, flat knit, BLE compression knee highs. Consider Elvarex classic for daytime and Jobst Relax for HOS Simple self-MLD. Consider advanced Flexitouch device to assist w long term self-management at home   Custom-made gradient compression garments and HOS devices are medically necessary in this case because they are uniquely sized and shaped to fit the exact dimensions of the affected extremities with deformities, and to provide accurate and consistent gradient compression and containment, essential to optimally managing this patient's symptoms of chronic, progressive lymphedema. Multiple custom compression garments are needed for optimal hygiene to limit infection risk. Custom compression garments should be replaced q 3-6 months When worn consistently for optimal lipo-lymphedema self-management over time.  ASSESSMENT: CLINICAL IMPRESSION: Initial comparative limb volumetrics reveal the following limb volume differentials: Leg LVD = 25.6%, L>R, Thigh LVD = 4.41%, R (dominant)>L, and Full Limb LVD =  8.85%, L>R%. Essentially limb volume is distributed primarily from foot to popliteal fossa with thighs being mildly, but symmetrically swollen. We'll plan to use compression below the knee and as per protocol,  utilize MLD to full limb, one leg at a time, to deep abdominal lymphatics and terminus. Applied short stretch, multi layer compression bandages   from toes to tibial tuberosity using Rosidal foam circumferentially over cotton stockinett, the 1 each Sm, med and LG short stretch wraps ( 8, 10 and 12 cm wide x 1 meter long) . Good productive session. All questions answered to Pt's satisfaction. Cont as per POC.  OBJECTIVE IMPAIRMENTS: decreased knowledge of condition, decreased knowledge of use of DME, decreased mobility, increased edema, pain, and chronic lower extremity swelling and associated pain, lymphedema related skin changes ( decreased excursion, increased skin approximation) increased infection risk  ACTIVITY LIMITATIONS: sitting tolerance, standing tolerance, and functional ambulation. Basic and Instrumental ADLS ( fitting shoes, socks and lower body clothing, grooming nails, inspecting skin, performing skin care, driving, shopping, housework) leisure pursuits (walking for exercise), productive activities ( caring for others, work duties),   PARTICIPATION LIMITATIONS:  negative body image limits clothing selection and social participation  PERSONAL FACTORS: Time since onset of onset/ level of progression; exacerbating co morbidities: : HTN, CVI   REHAB POTENTIAL: Good  EVALUATION COMPLEXITY: Moderate   GOALS: Goals reviewed with patient? Yes  SHORT TERM GOALS: Target date: 4th OT Rx visit  Pt will demonstrate understanding of lymphedema precautions and prevention strategies with modified independence using a printed reference to identify at least 5 precautions and discussing how s/he may implement them into daily life to reduce risk of progression with extra time. Baseline:Max  A Goal status: INITIAL  2.  Pt will be able to apply multilayer, thigh length, gradient, compression wraps to one leg at a time with modified assistance (extra time) to decrease limb volume, to limit infection risk, and to limit lymphedema progression.  Baseline: Dependent Goal status: INITIAL  LONG TERM GOALS: Target date: 06/12/23  Given this patient's Intake score of 81/100% on the functional outcomes FOTO tool, patient will experience an increase in function of 3 points to improve basic and instrumental ADLs performance, including lymphedema self-care.  Baseline: 81% Goal status: INITIAL  2.  Given this patient's Intake score of 20.59 % on the Lymphedema Life Impact Scale (LLIS), patient will  experience a reduction of at least 5 points in her perceived level of functional impairment resulting from lymphedema to improve functional performance and quality of life (QOL). Baseline: 20.59% Goal status: INITIAL  3.  During Intensive phase CDT Pt will achieve at least 85% compliance with all lymphedema self-care home program components, including daily skin care, compression wraps and /or garments, simple self MLD and lymphatic pumping therex to habituate LE self care protocol  into ADLs for optimal LE self-management over time. Baseline: Dependent Goal status: INITIAL  4.  Pt will achieve at least a 10% volume reduction in the BLE to return limb to typical size and shape, to limit infection risk and LE progression, to decrease pain, to improve function. Baseline: Dependent Goal status: INITIAL  5.  STG: Pt will obtain proper compression garments/devices and achieve modified independence (extra time + assistive devices) with donning/doffing to optimize limb volume reductions and limit LE  progression over time. Baseline:  Goal status: INITIAL  PLAN:  PT FREQUENCY: 2x/weekand PRN  PT DURATION: other: and PRN  PLANNED INTERVENTIONS: Complete Decongestive Therapy (CDT): Therapeutic  lymphatic pumping exercises, skin care simultaneously w MLD to limit infection risk, Manual lymph drainage (MLD), During Intensive Phase- multilayer Compression bandaging; during self-management phase formulate appropriate compression garments and devices, complete anatomical measurements, fitting and assessment t of garments; Consider trial of Flexitouch ADVANCED sequential compression device as replacement for incorrect basic vaso pneumatic device. Patient/Family education, DME instructions,    PLAN FOR NEXT SESSION:  BLE comparative limb volumetrics LLE knee length multilayer compression wrap Pt edu for LE self care home program  Loel Dubonnet, MS, OTR/L, CLT-LANA 03/22/23 1:35 PM

## 2023-03-27 ENCOUNTER — Ambulatory Visit: Payer: 59 | Admitting: Occupational Therapy

## 2023-03-29 ENCOUNTER — Ambulatory Visit: Payer: 59 | Admitting: Occupational Therapy

## 2023-04-02 ENCOUNTER — Ambulatory Visit: Payer: 59 | Admitting: Occupational Therapy

## 2023-04-02 ENCOUNTER — Encounter: Payer: Self-pay | Admitting: Occupational Therapy

## 2023-04-02 DIAGNOSIS — I890 Lymphedema, not elsewhere classified: Secondary | ICD-10-CM | POA: Diagnosis not present

## 2023-04-02 NOTE — Therapy (Signed)
OUTPATIENT OCCUPATIONAL THERAPY TREATMENT LOWER EXTREMITY LYMPHEDEMA  Patient Name: Kelsey Terry MRN: [id number] DOB:08-14-1961, 62 y.o., female Today's Date: 03/22/2023  END OF SESSION:   Past Medical History:  Diagnosis Date   Allergies    Asthma    Blood dyscrasia    blood clot in right leg   CAD (coronary artery disease) 05/05/2019   CAD in native artery    a. NSTEMI 05/16/18: LHC 05/16/18 - ost-pLAD 95% s/p PCI/DES, mLAD 30%, ostD1 70%, ost-pLCx 30%, mRCA 90%, dRCA 20%; b. staged PCI/DES to [facility] 7/1   Complication of anesthesia    patient stated she had "some kind of reaction to anesthesia, "thought she was having a stroke" during her second cath procedure.   Diabetes mellitus without complication (HCC)    pre-diabetes   Diastolic dysfunction    a. TTE 05/16/18: EF 55-60%, no RWMA, Gr1DD, trivial AI, calcified mitral annulus, mild MR, mildly dilated LA   Dyspnea    with activity   Hypertension    Lymphedema    LLE   Pericarditis    a. ~ 2012 in [place], [place] s/p pericardiocentesis    Wears contact lenses    Past Surgical History:  Procedure Laterality Date   ABDOMINAL HYSTERECTOMY     CESAREAN SECTION     COLONOSCOPY WITH PROPOFOL N/A 08/13/2019   Procedure: COLONOSCOPY WITH BIOPSIES;  Surgeon: Midge Minium, MD;  Location: [facility];  Service: Endoscopy;  Laterality: N/A;   CORONARY STENT INTERVENTION N/A 05/16/2018   Procedure: CORONARY STENT INTERVENTION;  Surgeon: Iran Ouch, MD;  Location: [facility];  Service: Cardiovascular;  Laterality: N/A;   CORONARY STENT INTERVENTION N/A 05/19/2018   Procedure: CORONARY STENT INTERVENTION;  Surgeon: Iran Ouch, MD;  Location: [facility];  Service: Cardiovascular;  Laterality: N/A;   LEFT HEART CATH AND CORONARY ANGIOGRAPHY N/A 05/16/2018   Procedure: LEFT HEART CATH AND CORONARY ANGIOGRAPHY;  Surgeon: Iran Ouch, MD;  Location: [facility];  Service: Cardiovascular;   Laterality: N/A;   PERICARDIOCENTESIS     POLYPECTOMY N/A 08/13/2019   Procedure: POLYPECTOMY;  Surgeon: Midge Minium, MD;  Location: [facility];  Service: Endoscopy;  Laterality: N/A;   Patient Active Problem List   Diagnosis Date Noted   Cough 03/31/2022   Hyperlipidemia 03/06/2021   Familial hypercholesterolemia 08/23/2020   Acute bilateral knee pain 01/13/2020   Health care maintenance 11/24/2019   H/O elevated lipids 11/04/2019   Lymphedema 08/26/2019   Chronic venous insufficiency 08/26/2019   Encounter for screening colonoscopy    Polyp of sigmoid colon    Polyp of ascending colon    Lymphedema of left leg 06/16/2019   Prediabetes 06/16/2019   Abnormal laboratory test result 06/16/2019   Prediabetes 05/20/2019   Essential hypertension 05/19/2019   Abscess of left buttock 05/05/2019   Essential hypertension 05/05/2019   CAD (coronary artery disease) 05/05/2019   NSTEMI (non-ST elevated myocardial infarction) (HCC) 05/16/2018    PCP: Duanne Limerick, MD  REFERRING PROVIDER: Levora Dredge, MD  REFERRING DIAG: I89.0  THERAPY DIAG:  Lymphedema, not elsewhere classified  Rationale for Evaluation and Treatment: Rehabilitation  ONSET DATE: 03/14/23  SUBJECTIVE:  SUBJECTIVE STATEMENT: Ms Berthelsen presents for first OT visit to address BLE lymphedema. Pt denies LE-related leg pain and has no new complaints.   PERTINENT HISTORY: LE, CVI, HTN, Prediabetes, CAD, s/p NSTEMI 04/2028  PAIN: Are you having lymphedema -related  pain? No  PRECAUTIONS: Other: LYMPHEDEMA PRECAUTIONS: CARDIAC, DM- skin  FALLS: Has patient fallen since last visit? No  HAND DOMINANCE: right   PRIOR LEVEL OF FUNCTION: Independent  PATIENT GOALS: to be able to do something better to take care of my legs; to  learn about any new management technology; to keep the lymphedema from getting worse."   OBJECTIVE:  OBSERVATIONS / OTHER ASSESSMENTS: Moderate, Stage  II, Primary, Bilateral Lower Extremity Lymphedema, L>R (Lymphedema Praecox)  FOTO functional outcome score: Intake 03/14/23: 81%  Lymphedema Life Impact Scale (LLIS) Intake 03/14/23: 20.59% (The extent to which lymphedema -related problems impacted your life in the past week)    BLE COMPARATIVE LIMB VOLUMETRICS Intake Visit 1 03/21/23  LANDMARK RIGHT    R LEG (A-D) 3627.8 ml  R THIGH (E-G) 6402.0 ml  R FULL LIMB (A-G) 10029.8 ml  Limb Volume differential (LVD)  %  Volume change since initial %  Volume change overall V  (Blank rows = not tested)  LANDMARK LEFT    L LEG (A-D) 4875.0 ml  L THIGH (E-G) 6129.6 ml  L  FULL LIMB (A-G) 11004.6 ml  Limb Volume differential (LVD)  Leg LVD = 25.6%, L>R Thigh LVD = 4.41%, R (dominant)>L Full Limb LVD = 8.85%, L>R%  Volume change since initial %  Volume change overall %  (Blank rows = not tested)    TODAY'S TREATMENT:  Pt edu for LLE multilayer compression wraps - knee length- to LLE       Pt edu for LE self care Clinical compression wrap                                                                                                                                 PATIENT EDUCATION:  Education details: Provided Pt/ family education regarding lymphatic structure and function, lymphedema etiology, onset patterns and stages of progression. Taught interaction between blood circulatory system and lymphatic circulation.Discussed  impact of gravity and co-morbidities on lymphatic function. Outlined Complete Decongestive Therapy (CDT)  as standard of care and provided in depth information regarding 4 primary components of Intensive and Self Management Phases, including Manual Lymph Drainage (MLD), compression wrapping and garments, skin care, and therapeutic exercise. Homero Fellers discussion with re need  for frequent attendance and high burden of care when caregiver is needed, impact of co morbidities. We discussed  the chronic, progressive nature of lymphedema and Importance of daily, ongoing LE self-care essential for limiting progression and infection risk. Discussed the demanding nature of CDT, high burden of care between visits and throughout self-management phase,  and importance of high level of compliance with all home program components for optimal outcome.  Person educated: Patient  Education method: Explanation, Demonstration, and Handouts Education comprehension: verbalized understanding, returned demonstration, and needs further education  LE SELF-CARE HOME PROGRAM: Lymphedema Self-Care Home Program: BLE Lymphatic Pumping There ex Skin care to limit infection risk Compression Intensive stage compression: multilayer short stretch wraps with gradient techniques. One limb at a time. Self-management Phase Compression: Custom, flat knit, BLE compression knee highs. Consider Elvarex classic for daytime and Jobst Relax for HOS Simple self-MLD. Consider advanced Flexitouch device to assist w long term self-management at home   Custom-made gradient compression garments and HOS devices are medically necessary in this case because they are uniquely sized and shaped to fit the exact dimensions of the affected extremities with deformities, and to provide accurate and consistent gradient compression and containment, essential to optimally managing this patient's symptoms of chronic, progressive lymphedema. Multiple custom compression garments are needed for optimal hygiene to limit infection risk. Custom compression garments should be replaced q 3-6 months When worn consistently for optimal lipo-lymphedema self-management over time.  ASSESSMENT: CLINICAL IMPRESSION: Provided Pt edu for multilayer compression bandages using gradient techniques. Given opportunities to practice, Pt is able to apply a  knee length wrap correctly to LLE with minA after skilled teaching. Pt educated on bandaging precautions and instructed to remove wraps if she experiences atypical SOB, sudden onset of leg pain, and/ or signs of infection. Good return. Productive session. Pt is a quick study of a challenging modality. Cont as per POC.  OBJECTIVE IMPAIRMENTS: decreased knowledge of condition, decreased knowledge of use of DME, decreased mobility, increased edema, pain, and chronic lower extremity swelling and associated pain, lymphedema related skin changes ( decreased excursion, increased skin approximation) increased infection risk  ACTIVITY LIMITATIONS: sitting tolerance, standing tolerance, and functional ambulation. Basic and Instrumental ADLS ( fitting shoes, socks and lower body clothing, grooming nails, inspecting skin, performing skin care, driving, shopping, housework) leisure pursuits (walking for exercise), productive activities ( caring for others, work duties),   PARTICIPATION LIMITATIONS:  negative body image limits clothing selection and social participation  PERSONAL FACTORS: Time since onset of onset/ level of progression; exacerbating co morbidities: : HTN, CVI   REHAB POTENTIAL: Good  EVALUATION COMPLEXITY: Moderate   GOALS: Goals reviewed with patient? Yes  SHORT TERM GOALS: Target date: 4th OT Rx visit  Pt will demonstrate understanding of lymphedema precautions and prevention strategies with modified independence using a printed reference to identify at least 5 precautions and discussing how s/he may implement them into daily life to reduce risk of progression with extra time. Baseline:Max A Goal status: INITIAL  2.  Pt will be able to apply multilayer, thigh length, gradient, compression wraps to one leg at a time with modified assistance (extra time) to decrease limb volume, to limit infection risk, and to limit lymphedema progression.  Baseline: Dependent Goal status:  INITIAL  LONG TERM GOALS: Target date: 06/12/23  Given this patient's Intake score of 81/100% on the functional outcomes FOTO tool, patient will experience an increase in function of 3 points to improve basic and instrumental ADLs performance, including lymphedema self-care.  Baseline: 81% Goal status: INITIAL  2.  Given this patient's Intake score of 20.59 % on the Lymphedema Life Impact Scale (LLIS), patient will experience a reduction of at least 5 points in her perceived level of functional impairment resulting from lymphedema to improve functional performance and quality of life (QOL). Baseline: 20.59% Goal status: INITIAL  3.  During Intensive phase CDT Pt will achieve at least 85% compliance with all lymphedema  self-care home program components, including daily skin care, compression wraps and /or garments, simple self MLD and lymphatic pumping therex to habituate LE self care protocol  into ADLs for optimal LE self-management over time. Baseline: Dependent Goal status: INITIAL  4.  Pt will achieve at least a 10% volume reduction in the BLE to return limb to typical size and shape, to limit infection risk and LE progression, to decrease pain, to improve function. Baseline: Dependent Goal status: INITIAL  5.  STG: Pt will obtain proper compression garments/devices and achieve modified independence (extra time + assistive devices) with donning/doffing to optimize limb volume reductions and limit LE  progression over time. Baseline:  Goal status: INITIAL  PLAN:  PT FREQUENCY: 2x/weekand PRN  PT DURATION: other: and PRN  PLANNED INTERVENTIONS: Complete Decongestive Therapy (CDT): Therapeutic lymphatic pumping exercises, skin care simultaneously w MLD to limit infection risk, Manual lymph drainage (MLD), During Intensive Phase- multilayer Compression bandaging; during self-management phase formulate appropriate compression garments and devices, complete anatomical measurements, fitting  and assessment t of garments; Consider trial of Flexitouch ADVANCED sequential compression device as replacement for incorrect basic vaso pneumatic device. Patient/Family education, DME instructions,    PLAN FOR NEXT SESSION:  Cont Pt edu for wrapping. Commence MLD to LLE/LLQ. LLE knee length multilayer compression wrap Pt edu for LE self care home program  Loel Dubonnet, MS, OTR/L, CLT-LANA 04/02/23 9:26 AM

## 2023-04-05 ENCOUNTER — Encounter: Payer: Self-pay | Admitting: Occupational Therapy

## 2023-04-05 ENCOUNTER — Ambulatory Visit: Payer: 59 | Admitting: Occupational Therapy

## 2023-04-05 DIAGNOSIS — I890 Lymphedema, not elsewhere classified: Secondary | ICD-10-CM | POA: Diagnosis not present

## 2023-04-05 NOTE — Therapy (Signed)
OUTPATIENT OCCUPATIONAL THERAPY TREATMENT LOWER EXTREMITY LYMPHEDEMA  Patient Name: Kelsey Terry MRN: [id number] DOB:1961-04-09, 62 y.o., female Today's Date: 03/22/2023  END OF SESSION:   Past Medical History:  Diagnosis Date   Allergies    Asthma    Blood dyscrasia    blood clot in right leg   CAD (coronary artery disease) 05/05/2019   CAD in native artery    a. NSTEMI 05/16/18: LHC 05/16/18 - ost-pLAD 95% s/p PCI/DES, mLAD 30%, ostD1 70%, ost-pLCx 30%, mRCA 90%, dRCA 20%; b. staged PCI/DES to [facility] 7/1   Complication of anesthesia    patient stated she had "some kind of reaction to anesthesia, "thought she was having a stroke" during her second cath procedure.   Diabetes mellitus without complication (HCC)    pre-diabetes   Diastolic dysfunction    a. TTE 05/16/18: EF 55-60%, no RWMA, Gr1DD, trivial AI, calcified mitral annulus, mild MR, mildly dilated LA   Dyspnea    with activity   Hypertension    Lymphedema    LLE   Pericarditis    a. ~ 2012 in [place], [place] s/p pericardiocentesis    Wears contact lenses    Past Surgical History:  Procedure Laterality Date   ABDOMINAL HYSTERECTOMY     CESAREAN SECTION     COLONOSCOPY WITH PROPOFOL N/A 08/13/2019   Procedure: COLONOSCOPY WITH BIOPSIES;  Surgeon: Midge Minium, MD;  Location: [facility];  Service: Endoscopy;  Laterality: N/A;   CORONARY STENT INTERVENTION N/A 05/16/2018   Procedure: CORONARY STENT INTERVENTION;  Surgeon: Iran Ouch, MD;  Location: [facility];  Service: Cardiovascular;  Laterality: N/A;   CORONARY STENT INTERVENTION N/A 05/19/2018   Procedure: CORONARY STENT INTERVENTION;  Surgeon: Iran Ouch, MD;  Location: [facility];  Service: Cardiovascular;  Laterality: N/A;   LEFT HEART CATH AND CORONARY ANGIOGRAPHY N/A 05/16/2018   Procedure: LEFT HEART CATH AND CORONARY ANGIOGRAPHY;  Surgeon: Iran Ouch, MD;  Location: [facility];  Service: Cardiovascular;   Laterality: N/A;   PERICARDIOCENTESIS     POLYPECTOMY N/A 08/13/2019   Procedure: POLYPECTOMY;  Surgeon: Midge Minium, MD;  Location: [facility];  Service: Endoscopy;  Laterality: N/A;   Patient Active Problem List   Diagnosis Date Noted   Cough 03/31/2022   Hyperlipidemia 03/06/2021   Familial hypercholesterolemia 08/23/2020   Acute bilateral knee pain 01/13/2020   Health care maintenance 11/24/2019   H/O elevated lipids 11/04/2019   Lymphedema 08/26/2019   Chronic venous insufficiency 08/26/2019   Encounter for screening colonoscopy    Polyp of sigmoid colon    Polyp of ascending colon    Lymphedema of left leg 06/16/2019   Prediabetes 06/16/2019   Abnormal laboratory test result 06/16/2019   Prediabetes 05/20/2019   Essential hypertension 05/19/2019   Abscess of left buttock 05/05/2019   Essential hypertension 05/05/2019   CAD (coronary artery disease) 05/05/2019   NSTEMI (non-ST elevated myocardial infarction) (HCC) 05/16/2018    PCP: Duanne Limerick, MD  REFERRING PROVIDER: Levora Dredge, MD  REFERRING DIAG: I89.0  THERAPY DIAG:  Lymphedema, not elsewhere classified  Rationale for Evaluation and Treatment: Rehabilitation  ONSET DATE: 03/14/23  SUBJECTIVE:  SUBJECTIVE STATEMENT: Kelsey Terry presents for OT visit to address BLE lymphedema. Pt denies LE-related leg pain and has no new complaints or concerns. She reports she is tolerating knee length compression wraps very well, although, "It takes some getting use to".  PERTINENT HISTORY: LE, CVI, HTN, Prediabetes, CAD, s/p NSTEMI 04/2028  PAIN: Are you having lymphedema -related  pain? No  PRECAUTIONS: Other: LYMPHEDEMA PRECAUTIONS: CARDIAC, DM- skin  FALLS: Has patient fallen since last visit? No  HAND DOMINANCE: right    PRIOR LEVEL OF FUNCTION: Independent  PATIENT GOALS: to be able to do something better to take care of my legs; to learn about any new management technology; to keep the lymphedema from getting worse."   OBJECTIVE:  OBSERVATIONS / OTHER ASSESSMENTS: Moderate, Stage  II, Primary, Bilateral Lower Extremity Lymphedema, L>R (Lymphedema [place]ox)  FOTO functional outcome score: Intake 03/14/23: 81%  Lymphedema Life Impact Scale (LLIS) Intake 03/14/23: 20.59% (The extent to which lymphedema -related problems impacted your life in the past week)    BLE COMPARATIVE LIMB VOLUMETRICS Intake Visit 1 03/21/23  LANDMARK RIGHT    R LEG (A-D) 3627.8 ml  R THIGH (E-G) 6402.0 ml  R FULL LIMB (A-G) 10029.8 ml  Limb Volume differential (LVD)  %  Volume change since initial %  Volume change overall V  (Blank rows = not tested)  LANDMARK LEFT    L LEG (A-D) 4875.0 ml  L THIGH (E-G) 6129.6 ml  L  FULL LIMB (A-G) 11004.6 ml  Limb Volume differential (LVD)  Leg LVD = 25.6%, L>R Thigh LVD = 4.41%, R (dominant)>L Full Limb LVD = 8.85%, L>R%  Volume change since initial %  Volume change overall %  (Blank rows = not tested)    TODAY'S TREATMENT:  Pt edu for LLE multilayer compression wraps - knee length- to LLE       Pt edu for LE self care Clinical compression wrap                                                                                                                                 PATIENT EDUCATION:  Continued Pt/ CG edu for lymphedema self care home program throughout session. Topics include outcome of comparative limb volumetrics- starting limb volume differentials (LVDs), technology and gradient techniques used for short stretch, multilayer compression wrapping, simple self-MLD, therapeutic lymphatic pumping exercises, skin/nail care, LE precautions,. compression garment recommendations and specifications, wear and care schedule and compression garment donning / doffing w assistive  devices. Discussed progress towards all OT goals since commencing CDT. All questions answered to the Pt's satisfaction. Good return.  Person educated: Patient Education method: Explanation, Demonstration, and Handouts Education comprehension: verbalized understanding, returned demonstration, and needs further education  LE SELF-CARE HOME PROGRAM: Lymphedema Self-Care Home Program: BLE Lymphatic Pumping There ex Skin care to limit infection risk Compression Intensive stage compression: multilayer short stretch wraps with gradient techniques. One limb at a time. Self-management  Phase Compression: Custom, flat knit, BLE compression knee highs. Consider Elvarex classic for daytime and Jobst Relax for HOS Simple self-MLD.LLE/LLQ MLD utilizing short neck sequence, deep, diaphragmatic breathing, stationary J strokes to functional inguinal LN at L groin, then dynamic J strokes from proximal to distal from thigh, knee, leg and foot, with 3 sweeps to terminus to complete session.   Custom-made gradient compression garments and HOS devices are medically necessary in this case because they are uniquely sized and shaped to fit the exact dimensions of the affected extremities with deformities, and to provide accurate and consistent gradient compression and containment, essential to optimally managing this patient's symptoms of chronic, progressive lymphedema. Multiple custom compression garments are needed for optimal hygiene to limit infection risk. Custom compression garments should be replaced q 3-6 months When worn consistently for optimal lipo-lymphedema self-management over time.  ASSESSMENT: CLINICAL IMPRESSION: Commenced LLE/LLQ MLD utilizing short neck sequence, deep, diaphragmatic breathing, stationary J strokes to functional inguinal LN at L groin, then dynamic J strokes from proximal to distal from thigh, knee, leg and foot, with 3 sweeps to terminus to complete session. Pt verbalized understanding of  lymphatic structure and function in reference to MLD. Reviewed multilayer compression bandages using gradient techniques at end of session while applying wraps.Productive session. Pt is a quick study of a challenging modality. Cont as per POC.  OBJECTIVE IMPAIRMENTS: decreased knowledge of condition, decreased knowledge of use of DME, decreased mobility, increased edema, pain, and chronic lower extremity swelling and associated pain, lymphedema related skin changes ( decreased excursion, increased skin approximation) increased infection risk  ACTIVITY LIMITATIONS: sitting tolerance, standing tolerance, and functional ambulation. Basic and Instrumental ADLS ( fitting shoes, socks and lower body clothing, grooming nails, inspecting skin, performing skin care, driving, shopping, housework) leisure pursuits (walking for exercise), productive activities ( caring for others, work duties),   PARTICIPATION LIMITATIONS:  negative body image limits clothing selection and social participation  PERSONAL FACTORS: Time since onset of onset/ level of progression; exacerbating co morbidities: : HTN, CVI   REHAB POTENTIAL: Good  EVALUATION COMPLEXITY: Moderate   GOALS: Goals reviewed with patient? Yes  SHORT TERM GOALS: Target date: 4th OT Rx visit  Pt will demonstrate understanding of lymphedema precautions and prevention strategies with modified independence using a printed reference to identify at least 5 precautions and discussing how s/he may implement them into daily life to reduce risk of progression with extra time. Baseline:Max A Goal status: INITIAL  2.  Pt will be able to apply multilayer, thigh length, gradient, compression wraps to one leg at a time with modified assistance (extra time) to decrease limb volume, to limit infection risk, and to limit lymphedema progression.  Baseline: Dependent Goal status: INITIAL  LONG TERM GOALS: Target date: 06/12/23  Given this patient's Intake score of  81/100% on the functional outcomes FOTO tool, patient will experience an increase in function of 3 points to improve basic and instrumental ADLs performance, including lymphedema self-care.  Baseline: 81% Goal status: INITIAL  2.  Given this patient's Intake score of 20.59 % on the Lymphedema Life Impact Scale (LLIS), patient will experience a reduction of at least 5 points in her perceived level of functional impairment resulting from lymphedema to improve functional performance and quality of life (QOL). Baseline: 20.59% Goal status: INITIAL  3.  During Intensive phase CDT Pt will achieve at least 85% compliance with all lymphedema self-care home program components, including daily skin care, compression wraps and /or garments, simple self MLD  and lymphatic pumping therex to habituate LE self care protocol  into ADLs for optimal LE self-management over time. Baseline: Dependent Goal status: INITIAL  4.  Pt will achieve at least a 10% volume reduction in the BLE to return limb to typical size and shape, to limit infection risk and LE progression, to decrease pain, to improve function. Baseline: Dependent Goal status: INITIAL  5.  STG: Pt will obtain proper compression garments/devices and achieve modified independence (extra time + assistive devices) with donning/doffing to optimize limb volume reductions and limit LE  progression over time. Baseline:  Goal status: INITIAL  PLAN:  PT FREQUENCY: 2x/weekand PRN  PT DURATION: other: and PRN  PLANNED INTERVENTIONS: Complete Decongestive Therapy (CDT): Therapeutic lymphatic pumping exercises, skin care simultaneously w MLD to limit infection risk, Manual lymph drainage (MLD), During Intensive Phase- multilayer Compression bandaging; during self-management phase formulate appropriate compression garments and devices, complete anatomical measurements, fitting and assessment t of garments; Consider trial of Flexitouch ADVANCED sequential  compression device as replacement for incorrect basic vaso pneumatic device. Patient/Family education, DME instructions,    PLAN FOR NEXT SESSION:  Cont Pt edu for wrapping. Commence MLD to LLE/LLQ. LLE knee length multilayer compression wrap Pt edu for LE self care home program  Loel Dubonnet, Kelsey, OTR/L, CLT-LANA 04/05/23 9:13 AM

## 2023-04-09 ENCOUNTER — Encounter: Payer: 59 | Admitting: Occupational Therapy

## 2023-04-10 ENCOUNTER — Ambulatory Visit: Payer: 59 | Admitting: Occupational Therapy

## 2023-04-10 ENCOUNTER — Encounter: Payer: Self-pay | Admitting: Family Medicine

## 2023-04-10 ENCOUNTER — Ambulatory Visit (INDEPENDENT_AMBULATORY_CARE_PROVIDER_SITE_OTHER): Payer: 59 | Admitting: Family Medicine

## 2023-04-10 VITALS — BP 112/76 | HR 70 | Ht 63.0 in | Wt 174.0 lb

## 2023-04-10 DIAGNOSIS — J019 Acute sinusitis, unspecified: Secondary | ICD-10-CM | POA: Diagnosis not present

## 2023-04-10 MED ORDER — DOXYCYCLINE HYCLATE 100 MG PO TABS: 100.0000 mg | ORAL_TABLET | Freq: Two times a day (BID) | ORAL | 0 refills | Status: DC

## 2023-04-10 NOTE — Progress Notes (Signed)
Date:  04/10/2023   Name:  Kelsey Terry   DOB:  1961/02/08   MRN:  [id number]   Chief Complaint: Sinus Problem (Started Sunday, head feels heavy, coughing, sore throat on monday)  Sinusitis This is a new problem. The current episode started in the past 7 days. The problem has been gradually worsening since onset. There has been no fever. She is experiencing no pain. Associated symptoms include congestion, coughing, headaches, sinus pressure and sneezing. Pertinent negatives include no chills, diaphoresis, ear pain, neck pain, shortness of breath, sore throat or swollen glands. Past treatments include nothing.    Lab Results  Component Value Date   NA 142 11/06/2022   K 3.5 11/06/2022   CO2 26 11/06/2022   GLUCOSE 83 11/06/2022   BUN 16 11/06/2022   CREATININE 1.11 (H) 11/06/2022   CALCIUM 9.9 11/06/2022   EGFR 56 (L) 11/06/2022   GFRNONAA 60 (L) 06/27/2020   Lab Results  Component Value Date   CHOL 152 03/01/2022   HDL 57 03/01/2022   LDLCALC 84 03/01/2022   TRIG 53 03/01/2022   CHOLHDL 2.9 01/20/2020   Lab Results  Component Value Date   TSH 1.350 11/20/2018   Lab Results  Component Value Date   HGBA1C 6.0 (H) 11/06/2022   Lab Results  Component Value Date   WBC 5.3 06/27/2020   HGB 14.0 06/27/2020   HCT 44.5 06/27/2020   MCV 84.6 06/27/2020   PLT 210 06/27/2020   Lab Results  Component Value Date   ALT 17 03/01/2022   AST 24 03/01/2022   ALKPHOS 56 03/01/2022   BILITOT 0.7 03/01/2022   No results found for: "25OHVITD2", "25OHVITD3", "VD25OH"   Review of Systems  Constitutional:  Positive for fatigue. Negative for chills, diaphoresis and fever.  HENT:  Positive for congestion, postnasal drip, sinus pressure, sinus pain and sneezing. Negative for ear pain, nosebleeds, rhinorrhea and sore throat.   Respiratory:  Positive for cough. Negative for chest tightness, shortness of breath and wheezing.   Cardiovascular:  Negative for chest pain and palpitations.   Musculoskeletal:  Negative for neck pain.  Neurological:  Positive for headaches.    Patient Active Problem List   Diagnosis Date Noted   Cough 03/31/2022   Hyperlipidemia 03/06/2021   Familial hypercholesterolemia 08/23/2020   Acute bilateral knee pain 01/13/2020   Health care maintenance 11/24/2019   H/O elevated lipids 11/04/2019   Lymphedema 08/26/2019   Chronic venous insufficiency 08/26/2019   Encounter for screening colonoscopy    Polyp of sigmoid colon    Polyp of ascending colon    Lymphedema of left leg 06/16/2019   Prediabetes 06/16/2019   Abnormal laboratory test result 06/16/2019   Prediabetes 05/20/2019   Essential hypertension 05/19/2019   Abscess of left buttock 05/05/2019   Essential hypertension 05/05/2019   CAD (coronary artery disease) 05/05/2019   NSTEMI (non-ST elevated myocardial infarction) (HCC) 05/16/2018    Allergies  Allergen Reactions   Dorzolamide Other (See Comments)    Chest discomfort   Latanoprost Other (See Comments)    Chest discomfort   Timolol Other (See Comments)    Chest discomfort   Amoxicillin Itching and Other (See Comments)    Reaction: bump in vaginal region Has patient had a PCN reaction causing immediate rash, facial/tongue/throat swelling, SOB or lightheadedness with hypotension: No Has patient had a PCN reaction causing severe rash involving mucus membranes or skin necrosis: No Has patient had a PCN reaction that required hospitalization: No Has  patient had a PCN reaction occurring within the last 10 years: No If all of the above answers are "NO", then may proceed with Cephalosporin use.    Erythromycin Hives and Rash    Past Surgical History:  Procedure Laterality Date   ABDOMINAL HYSTERECTOMY     CESAREAN SECTION     COLONOSCOPY WITH PROPOFOL N/A 08/13/2019   Procedure: COLONOSCOPY WITH BIOPSIES;  Surgeon: Midge Minium, MD;  Location: [facility];  Service: Endoscopy;  Laterality: N/A;   CORONARY STENT  INTERVENTION N/A 05/16/2018   Procedure: CORONARY STENT INTERVENTION;  Surgeon: Iran Ouch, MD;  Location: [facility];  Service: Cardiovascular;  Laterality: N/A;   CORONARY STENT INTERVENTION N/A 05/19/2018   Procedure: CORONARY STENT INTERVENTION;  Surgeon: Iran Ouch, MD;  Location: [facility];  Service: Cardiovascular;  Laterality: N/A;   LEFT HEART CATH AND CORONARY ANGIOGRAPHY N/A 05/16/2018   Procedure: LEFT HEART CATH AND CORONARY ANGIOGRAPHY;  Surgeon: Iran Ouch, MD;  Location: [facility];  Service: Cardiovascular;  Laterality: N/A;   PERICARDIOCENTESIS     POLYPECTOMY N/A 08/13/2019   Procedure: POLYPECTOMY;  Surgeon: Midge Minium, MD;  Location: [facility];  Service: Endoscopy;  Laterality: N/A;    Social History   Tobacco Use   Smoking status: Never   Smokeless tobacco: Never  Vaping Use   Vaping Use: Never used  Substance Use Topics   Alcohol use: Never   Drug use: Never     Medication list has been reviewed and updated.  Current Meds  Medication Sig   aspirin EC (ASPIRIN LOW DOSE) 81 MG tablet Take 1 tablet (81 mg total) by mouth daily. Swallow whole.   Blood Pressure KIT 1 kit by Does not apply route daily.   CALCIUM CITRATE-VITAMIN D PO Take 1 tablet by mouth 2 (two) times daily.   chlorthalidone (HYGROTON) 25 MG tablet TAKE 1/2 TABLET(12.5MG      TOTAL) DAILY.   losartan (COZAAR) 100 MG tablet Take 1 tablet (100 mg total) by mouth daily.   metFORMIN (GLUCOPHAGE) 500 MG tablet Take 1 tablet (500 mg total) by mouth daily.   Multiple Vitamin (MULTIVITAMIN) tablet Take 1 tablet by mouth daily.   potassium chloride (KLOR-CON) 10 MEQ tablet Take 2 tablets (20 mEq total) by mouth daily.   rosuvastatin (CRESTOR) 40 MG tablet Take 1 tablet (40 mg total) by mouth daily.       07/05/2022    3:15 PM 05/10/2022    4:16 PM 03/01/2022   10:04 AM 04/18/2021   10:36 AM  GAD 7 : Generalized Anxiety Score  Nervous,  Anxious, on Edge 0 0 0 0  Control/stop worrying 0 0 0 0  Worry too much - different things 0 0 0 0  Trouble relaxing 0 0 0 0  Restless 0 0 0 0  Easily annoyed or irritable 0 0 0 0  Afraid - awful might happen 0 0 0 0  Total GAD 7 Score 0 0 0 0  Anxiety Difficulty Not difficult at all Not difficult at all Not difficult at all        07/05/2022    3:15 PM 05/10/2022    4:16 PM 03/01/2022   10:04 AM  Depression screen PHQ 2/9  Decreased Interest 0 0 0  Down, Depressed, Hopeless 0 0 0  PHQ - 2 Score 0 0 0  Altered sleeping 0 0 0  Tired, decreased energy 0 0 0  Change in  appetite 0 0 0  Feeling bad or failure about yourself  0 0 0  Trouble concentrating 0 0 0  Moving slowly or fidgety/restless 0 0 0  Suicidal thoughts 0  0  PHQ-9 Score 0 0 0  Difficult doing work/chores Not difficult at all Not difficult at all Not difficult at all    BP Readings from Last 3 Encounters:  04/10/23 112/76  02/14/23 134/78  11/06/22 122/78    Physical Exam Vitals and nursing note reviewed. Exam conducted with a chaperone present.  Constitutional:      General: She is not in acute distress.    Appearance: She is not diaphoretic.  HENT:     Head: Normocephalic and atraumatic.     Right Ear: Tympanic membrane, ear canal and external ear normal.     Left Ear: Tympanic membrane, ear canal and external ear normal.     Nose: Congestion present. No rhinorrhea.     Right Sinus: No maxillary sinus tenderness or frontal sinus tenderness.     Left Sinus: No maxillary sinus tenderness or frontal sinus tenderness.     Mouth/Throat:     Mouth: Mucous membranes are moist.     Pharynx: Oropharynx is clear. No posterior oropharyngeal erythema.  Eyes:     General:        Right eye: No discharge.        Left eye: No discharge.     Conjunctiva/sclera: Conjunctivae normal.     Pupils: Pupils are equal, round, and reactive to light.  Neck:     Thyroid: No thyromegaly.     Vascular: No JVD.  Cardiovascular:      Rate and Rhythm: Normal rate and regular rhythm.     Heart sounds: Normal heart sounds. No murmur heard.    No friction rub. No gallop.  Pulmonary:     Effort: Pulmonary effort is normal.     Breath sounds: Normal breath sounds. No wheezing, rhonchi or rales.  Abdominal:     Palpations: Abdomen is soft.  Musculoskeletal:     Cervical back: Normal range of motion and neck supple.  Lymphadenopathy:     Cervical: No cervical adenopathy.  Skin:    General: Skin is warm.  Neurological:     Mental Status: She is alert.     Deep Tendon Reflexes: Reflexes are normal and symmetric.     Wt Readings from Last 3 Encounters:  04/10/23 174 lb (78.9 kg)  02/14/23 174 lb 6.4 oz (79.1 kg)  11/06/22 175 lb (79.4 kg)    BP 112/76   Pulse 70   Ht 5\' 3"  (1.6 m)   Wt 174 lb (78.9 kg)   SpO2 98%   BMI 30.82 kg/m   Assessment and Plan:  1. Acute non-recurrent sinusitis, unspecified location Acute.  Persistent.  Onset from earlier in the weekend.  Patient has postnasal drainage with some allergy symptomatology.  This is been persistent though we will cover with an antibiotic given her history and examination.  We will treat with doxycycline 100 mg twice a day.  We have also encouraged over-the-counter nasal steroid as well as Mucinex for mucolytic action. - doxycycline (VIBRA-TABS) 100 MG tablet; Take 1 tablet (100 mg total) by mouth 2 (two) times daily.  Dispense: 20 tablet; Refill: 0    Elizabeth Sauer, MD

## 2023-04-11 ENCOUNTER — Ambulatory Visit: Payer: 59 | Admitting: Occupational Therapy

## 2023-04-16 ENCOUNTER — Ambulatory Visit: Payer: 59 | Admitting: Occupational Therapy

## 2023-04-16 ENCOUNTER — Ambulatory Visit
Admission: EM | Admit: 2023-04-16 | Discharge: 2023-04-16 | Disposition: A | Discharge status: Home / Self Care | Payer: 59 | Attending: Emergency Medicine | Admitting: Emergency Medicine

## 2023-04-16 ENCOUNTER — Encounter: Payer: Self-pay | Admitting: Occupational Therapy

## 2023-04-16 DIAGNOSIS — I890 Lymphedema, not elsewhere classified: Secondary | ICD-10-CM

## 2023-04-16 DIAGNOSIS — J069 Acute upper respiratory infection, unspecified: Secondary | ICD-10-CM

## 2023-04-16 MED ORDER — IPRATROPIUM BROMIDE 0.06 % NA SOLN: 2.0000 | Freq: Four times a day (QID) | NASAL | 12 refills | Status: DC

## 2023-04-16 NOTE — ED Provider Notes (Signed)
[facility]    CSN: [id number] Arrival date & time: 04/16/23  1914      History   Chief Complaint Chief Complaint  Patient presents with   Fever    100.5    HPI Kelsey Terry is a 62 y.o. female.   HPI  28 old female with a past medical history significant for asthma, CAD, essential hypertension, and prediabetes presents for evaluation of 7 days worth of body aches, sinus pressure, dizziness, and nasal congestion.  She denies any nasal discharge, ear pain, sore throat, cough, shortness breath, or wheezing.  She came in because she had a temperature of 100.5 today.  She was seen by her PCP the day after her symptoms started and diagnosed with a sinus infection and was started on doxycycline, which she has been taking.  Past Medical History:  Diagnosis Date   Allergies    Asthma    Blood dyscrasia    blood clot in right leg   CAD (coronary artery disease) 05/05/2019   CAD in native artery    a. NSTEMI 05/16/18: LHC 05/16/18 - ost-pLAD 95% s/p PCI/DES, mLAD 30%, ostD1 70%, ost-pLCx 30%, mRCA 90%, dRCA 20%; b. staged PCI/DES to [facility] 7/1   Complication of anesthesia    patient stated she had "some kind of reaction to anesthesia, "thought she was having a stroke" during her second cath procedure.   Diabetes mellitus without complication (HCC)    pre-diabetes   Diastolic dysfunction    a. TTE 05/16/18: EF 55-60%, no RWMA, Gr1DD, trivial AI, calcified mitral annulus, mild MR, mildly dilated LA   Dyspnea    with activity   Hypertension    Lymphedema    LLE   Pericarditis    a. ~ 2012 in [place], [place] s/p pericardiocentesis    Wears contact lenses     Patient Active Problem List   Diagnosis Date Noted   Cough 03/31/2022   Hyperlipidemia 03/06/2021   Familial hypercholesterolemia 08/23/2020   Acute bilateral knee pain 01/13/2020   Health care maintenance 11/24/2019   H/O elevated lipids 11/04/2019   Lymphedema 08/26/2019   Chronic venous insufficiency 08/26/2019    Encounter for screening colonoscopy    Polyp of sigmoid colon    Polyp of ascending colon    Lymphedema of left leg 06/16/2019   Prediabetes 06/16/2019   Abnormal laboratory test result 06/16/2019   Prediabetes 05/20/2019   Essential hypertension 05/19/2019   Abscess of left buttock 05/05/2019   Essential hypertension 05/05/2019   CAD (coronary artery disease) 05/05/2019   NSTEMI (non-ST elevated myocardial infarction) (HCC) 05/16/2018    Past Surgical History:  Procedure Laterality Date   ABDOMINAL HYSTERECTOMY     CESAREAN SECTION     COLONOSCOPY WITH PROPOFOL N/A 08/13/2019   Procedure: COLONOSCOPY WITH BIOPSIES;  Surgeon: Midge Minium, MD;  Location: [facility];  Service: Endoscopy;  Laterality: N/A;   CORONARY STENT INTERVENTION N/A 05/16/2018   Procedure: CORONARY STENT INTERVENTION;  Surgeon: Iran Ouch, MD;  Location: [facility];  Service: Cardiovascular;  Laterality: N/A;   CORONARY STENT INTERVENTION N/A 05/19/2018   Procedure: CORONARY STENT INTERVENTION;  Surgeon: Iran Ouch, MD;  Location: [facility];  Service: Cardiovascular;  Laterality: N/A;   LEFT HEART CATH AND CORONARY ANGIOGRAPHY N/A 05/16/2018   Procedure: LEFT HEART CATH AND CORONARY ANGIOGRAPHY;  Surgeon: Iran Ouch, MD;  Location: [facility];  Service: Cardiovascular;  Laterality: N/A;   PERICARDIOCENTESIS  POLYPECTOMY N/A 08/13/2019   Procedure: POLYPECTOMY;  Surgeon: Midge Minium, MD;  Location: [facility];  Service: Endoscopy;  Laterality: N/A;    OB History   No obstetric history on file.      Home Medications    Prior to Admission medications   Medication Sig Start Date End Date Taking? Authorizing Provider  aspirin EC (ASPIRIN LOW DOSE) 81 MG tablet Take 1 tablet (81 mg total) by mouth daily. Swallow whole. 10/15/22  Yes Iran Ouch, MD  Blood Pressure KIT 1 kit by Does not apply route daily. 07/21/19  Yes [place],  Chioma E, NP  CALCIUM CITRATE-VITAMIN D PO Take 1 tablet by mouth 2 (two) times daily.   Yes [provider]  chlorthalidone (HYGROTON) 25 MG tablet TAKE 1/2 TABLET(12.5MG      TOTAL) DAILY. 12/03/22  Yes Duanne Limerick, MD  doxycycline (VIBRA-TABS) 100 MG tablet Take 1 tablet (100 mg total) by mouth 2 (two) times daily. 04/10/23  Yes Duanne Limerick, MD  ipratropium (ATROVENT) 0.06 % nasal spray Place 2 sprays into both nostrils 4 (four) times daily. 04/16/23  Yes Becky Augusta, NP  losartan (COZAAR) 100 MG tablet Take 1 tablet (100 mg total) by mouth daily. 11/06/22  Yes Duanne Limerick, MD  metFORMIN (GLUCOPHAGE) 500 MG tablet Take 1 tablet (500 mg total) by mouth daily. 11/06/22 05/05/23 Yes Duanne Limerick, MD  Multiple Vitamin (MULTIVITAMIN) tablet Take 1 tablet by mouth daily.   Yes [provider]  potassium chloride (KLOR-CON) 10 MEQ tablet Take 2 tablets (20 mEq total) by mouth daily. 11/06/22  Yes Duanne Limerick, MD  rosuvastatin (CRESTOR) 40 MG tablet Take 1 tablet (40 mg total) by mouth daily. 11/06/22  Yes Duanne Limerick, MD    Family History Family History  Problem Relation Age of Onset   Hypertension Mother    Pancreatic cancer Father    Diabetes Brother    Breast cancer Neg Hx     Social History Social History   Tobacco Use   Smoking status: Never   Smokeless tobacco: Never  Vaping Use   Vaping Use: Never used  Substance Use Topics   Alcohol use: Never   Drug use: Never     Allergies   Dorzolamide, Latanoprost, Timolol, Amoxicillin, and Erythromycin   Review of Systems Review of Systems  Constitutional:  Positive for fever.  HENT:  Positive for congestion and sinus pain. Negative for ear pain and rhinorrhea.   Respiratory:  Negative for cough, shortness of breath and wheezing.   Neurological:  Positive for dizziness.     Physical Exam Triage Vital Signs ED Triage Vitals  Enc Vitals Group     BP 04/16/23 1958 113/73     Pulse Rate  04/16/23 1955 73     Resp 04/16/23 1955 18     Temp 04/16/23 1955 99.8 F (37.7 C)     Temp Source 04/16/23 1955 Oral     SpO2 04/16/23 1955 100 %     Weight --      Height --      Head Circumference --      Peak Flow --      Pain Score 04/16/23 1952 5     Pain Loc --      Pain Edu? --      Excl. in GC? --    No data found.  Updated Vital Signs BP 113/73   Pulse 73   Temp 99.8 F (37.7  C) (Oral)   Resp 18   SpO2 100%   Visual Acuity Right Eye Distance:   Left Eye Distance:   Bilateral Distance:    Right Eye Near:   Left Eye Near:    Bilateral Near:     Physical Exam Vitals and nursing note reviewed.  Constitutional:      Appearance: Normal appearance. She is not ill-appearing.  HENT:     Head: Normocephalic and atraumatic.     Right Ear: Tympanic membrane, ear canal and external ear normal. There is no impacted cerumen.     Left Ear: Ear canal and external ear normal.     Ears:     Comments: Mild erythema to the rim of the left tympanic membrane and a small serous effusion.    Nose: Congestion present. No rhinorrhea.     Comments: Nasal mucosa is erythematous and edematous with scant clear discharge in both nares.    Mouth/Throat:     Mouth: Mucous membranes are moist.     Pharynx: Oropharynx is clear. Posterior oropharyngeal erythema present. No oropharyngeal exudate.     Comments: Mild erythema with clear postnasal drip in the posterior oropharynx. Cardiovascular:     Rate and Rhythm: Normal rate and regular rhythm.     Pulses: Normal pulses.     Heart sounds: Normal heart sounds. No murmur heard.    No friction rub. No gallop.  Pulmonary:     Effort: Pulmonary effort is normal.     Breath sounds: Normal breath sounds. No wheezing, rhonchi or rales.  Musculoskeletal:     Cervical back: Normal range of motion and neck supple.  Lymphadenopathy:     Cervical: No cervical adenopathy.  Skin:    General: Skin is warm and dry.     Capillary Refill: Capillary  refill takes less than 2 seconds.     Findings: No rash.  Neurological:     General: No focal deficit present.     Mental Status: She is alert and oriented to person, place, and time.      UC Treatments / Results  Labs (all labs ordered are listed, but only abnormal results are displayed) Labs Reviewed - No data to display  EKG   Radiology No results found.  Procedures Procedures (including critical care time)  Medications Ordered in UC Medications - No data to display  Initial Impression / Assessment and Plan / UC Course  I have reviewed the triage vital signs and the nursing notes.  Pertinent labs & imaging results that were available during my care of the patient were reviewed by me and considered in my medical decision making (see chart for details).   Patient is a pleasant, nontoxic-appearing 62 year old female presenting for evaluation of 1 weeks worth of respiratory symptoms as outlined in HPI above.  She has been taking doxycycline for a sinus infection since the day after her symptoms began but she reports that she has not noticed any improvement though she does have left sinus pressure.  She has had no nasal discharge.  She also denies sore throat, ear pain, or cough.  Her exam does reveal some erythema to the left tympanic membrane with a small serous effusion which I think is what is causing her intermittent dizziness.  She does have inflamed nasal mucosa with scant clear rhinorrhea and clear postnasal drip.  She has been on doxycycline for 7 days without any improvement of symptoms and have advised her to stop taking the doxycycline because I  believe her symptoms are viral in nature as she is not seeing any improvement.  I will prescribe Atrovent nasal spray that she can use for the nasal congestion.  I have discussed with her how to perform the Valsalva maneuver to help clear the fluid from her middle ear.  She should do this periodically throughout the day.  If she  develops any new or worsening symptoms she can return for reevaluation.  Tylenol and/or ibuprofen according to package instructions as needed for fever and bodyaches.   Final Clinical Impressions(s) / UC Diagnoses   Final diagnoses:  Viral upper respiratory tract infection     Discharge Instructions      Stop the Doxycycline.  Start using the Atrovent nasal spray, 2 squirts in each nostril every 6 hours to help with congestion.  Use OTC Tylenol and/or Ibuprofen as needed for fever and pain.  Equalize your ears periodically to help remove the middle ear fluid that is causing your dizziness.  If you develop andy new or worsening symptoms please return for re-evaluations.     ED Prescriptions     Medication Sig Dispense Auth. Provider   ipratropium (ATROVENT) 0.06 % nasal spray Place 2 sprays into both nostrils 4 (four) times daily. 15 mL Becky Augusta, NP      PDMP not reviewed this encounter.   Becky Augusta, NP 04/16/23 2034

## 2023-04-16 NOTE — Discharge Instructions (Signed)
Stop the Doxycycline.  Start using the Atrovent nasal spray, 2 squirts in each nostril every 6 hours to help with congestion.  Use OTC Tylenol and/or Ibuprofen as needed for fever and pain.  Equalize your ears periodically to help remove the middle ear fluid that is causing your dizziness.  If you develop andy new or worsening symptoms please return for re-evaluations.

## 2023-04-16 NOTE — Therapy (Signed)
OUTPATIENT OCCUPATIONAL THERAPY TREATMENT LOWER EXTREMITY LYMPHEDEMA  Patient Name: Kelsey Terry MRN: [id number] DOB:Apr 10, 1961, 62 y.o., female Today's Date: 03/22/2023  END OF SESSION:   OT End of Session - 04/16/23 0805     Visit Number 5    Number of Visits 36    Date for OT Re-Evaluation 06/12/23    OT Start Time 0805    OT Stop Time 0900    OT Time Calculation (min) 55 min    Activity Tolerance Patient tolerated treatment well;No increased pain    Behavior During Therapy [facility] for tasks assessed/performed               Past Medical History:  Diagnosis Date   Allergies    Asthma    Blood dyscrasia    blood clot in right leg   CAD (coronary artery disease) 05/05/2019   CAD in native artery    a. NSTEMI 05/16/18: LHC 05/16/18 - ost-pLAD 95% s/p PCI/DES, mLAD 30%, ostD1 70%, ost-pLCx 30%, mRCA 90%, dRCA 20%; b. staged PCI/DES to [facility] 7/1   Complication of anesthesia    patient stated she had "some kind of reaction to anesthesia, "thought she was having a stroke" during her second cath procedure.   Diabetes mellitus without complication (HCC)    pre-diabetes   Diastolic dysfunction    a. TTE 05/16/18: EF 55-60%, no RWMA, Gr1DD, trivial AI, calcified mitral annulus, mild MR, mildly dilated LA   Dyspnea    with activity   Hypertension    Lymphedema    LLE   Pericarditis    a. ~ 2012 in [place], [place] s/p pericardiocentesis    Wears contact lenses    Past Surgical History:  Procedure Laterality Date   ABDOMINAL HYSTERECTOMY     CESAREAN SECTION     COLONOSCOPY WITH PROPOFOL N/A 08/13/2019   Procedure: COLONOSCOPY WITH BIOPSIES;  Surgeon: Midge Minium, MD;  Location: [facility];  Service: Endoscopy;  Laterality: N/A;   CORONARY STENT INTERVENTION N/A 05/16/2018   Procedure: CORONARY STENT INTERVENTION;  Surgeon: Iran Ouch, MD;  Location: [facility];  Service: Cardiovascular;  Laterality: N/A;   CORONARY STENT INTERVENTION N/A 05/19/2018    Procedure: CORONARY STENT INTERVENTION;  Surgeon: Iran Ouch, MD;  Location: [facility];  Service: Cardiovascular;  Laterality: N/A;   LEFT HEART CATH AND CORONARY ANGIOGRAPHY N/A 05/16/2018   Procedure: LEFT HEART CATH AND CORONARY ANGIOGRAPHY;  Surgeon: Iran Ouch, MD;  Location: [facility];  Service: Cardiovascular;  Laterality: N/A;   PERICARDIOCENTESIS     POLYPECTOMY N/A 08/13/2019   Procedure: POLYPECTOMY;  Surgeon: Midge Minium, MD;  Location: [facility];  Service: Endoscopy;  Laterality: N/A;   Patient Active Problem List   Diagnosis Date Noted   Cough 03/31/2022   Hyperlipidemia 03/06/2021   Familial hypercholesterolemia 08/23/2020   Acute bilateral knee pain 01/13/2020   Health care maintenance 11/24/2019   H/O elevated lipids 11/04/2019   Lymphedema 08/26/2019   Chronic venous insufficiency 08/26/2019   Encounter for screening colonoscopy    Polyp of sigmoid colon    Polyp of ascending colon    Lymphedema of left leg 06/16/2019   Prediabetes 06/16/2019   Abnormal laboratory test result 06/16/2019   Prediabetes 05/20/2019   Essential hypertension 05/19/2019   Abscess of left buttock 05/05/2019   Essential hypertension 05/05/2019   CAD (coronary artery disease) 05/05/2019   NSTEMI (non-ST elevated myocardial infarction) (HCC) 05/16/2018    PCP:  Duanne Limerick, MD  REFERRING PROVIDER: Levora Dredge, MD  REFERRING DIAG: I89.0  THERAPY DIAG:  Lymphedema, not elsewhere classified  Rationale for Evaluation and Treatment: Rehabilitation  ONSET DATE: 03/14/23  SUBJECTIVE:                                                                                                                                                                                           SUBJECTIVE STATEMENT: Ms Dilauro presents for OT visit to address BLE lymphedema. Pt denies LE-related leg pain. Pt states she missed last visit due to a sinus  infection.  PERTINENT HISTORY: LE, CVI, HTN, Prediabetes, CAD, s/p NSTEMI 04/2028  PAIN: Are you having lymphedema -related  pain? No  PRECAUTIONS: Other: LYMPHEDEMA PRECAUTIONS: CARDIAC, DM- skin  FALLS: Has patient fallen since last visit? No  HAND DOMINANCE: right   PRIOR LEVEL OF FUNCTION: Independent  PATIENT GOALS: to be able to do something better to take care of my legs; to learn about any new management technology; to keep the lymphedema from getting worse."   OBJECTIVE:  OBSERVATIONS / OTHER ASSESSMENTS: Moderate, Stage  II, Primary, Bilateral Lower Extremity Lymphedema, L>R (Lymphedema Praecox)  FOTO functional outcome score: Intake 03/14/23: 81%  Lymphedema Life Impact Scale (LLIS) Intake 03/14/23: 20.59% (The extent to which lymphedema -related problems impacted your life in the past week)    BLE COMPARATIVE LIMB VOLUMETRICS Intake Visit 1 03/21/23  LANDMARK RIGHT    R LEG (A-D) 3627.8 ml  R THIGH (E-G) 6402.0 ml  R FULL LIMB (A-G) 10029.8 ml  Limb Volume differential (LVD)  %  Volume change since initial %  Volume change overall V  (Blank rows = not tested)  LANDMARK LEFT    L LEG (A-D) 4875.0 ml  L THIGH (E-G) 6129.6 ml  L  FULL LIMB (A-G) 11004.6 ml  Limb Volume differential (LVD)  Leg LVD = 25.6%, L>R Thigh LVD = 4.41%, R (dominant)>L Full Limb LVD = 8.85%, L>R%  Volume change since initial %  Volume change overall %  (Blank rows = not tested)    TODAY'S TREATMENT:  Pt edu for SIMPLE SELF MLD: J stroke, short neck sequence and diaphragmatic breathing. Pt edu for lymphatic pumping there ex. MLD to LLE/LLQ  Multilayer, knee length, LLE compression wrap  PATIENT EDUCATION:  Continued Pt/ CG edu for lymphedema self care home program throughout session. Topics include outcome of comparative limb volumetrics- starting limb volume  differentials (LVDs), technology and gradient techniques used for short stretch, multilayer compression wrapping, simple self-MLD, therapeutic lymphatic pumping exercises, skin/nail care, LE precautions,. compression garment recommendations and specifications, wear and care schedule and compression garment donning / doffing w assistive devices. Discussed progress towards all OT goals since commencing CDT. All questions answered to the Pt's satisfaction. Good return.  Person educated: Patient Education method: Explanation, Demonstration, and Handouts Education comprehension: verbalized understanding, returned demonstration, and needs further education  LE SELF-CARE HOME PROGRAM: Lymphedema Self-Care Home Program: BLE Lymphatic Pumping There ex Skin care to limit infection risk Compression Intensive stage compression: multilayer short stretch wraps with gradient techniques. One limb at a time. Self-management Phase Compression: Custom, flat knit, BLE compression knee highs. Consider Elvarex classic for daytime and Jobst Relax for HOS Simple self-MLD.LLE/LLQ MLD utilizing short neck sequence, deep, diaphragmatic breathing, stationary J strokes to functional inguinal LN at L groin, then dynamic J strokes from proximal to distal from thigh, knee, leg and foot, with 3 sweeps to terminus to complete session.   Custom-made gradient compression garments and HOS devices are medically necessary in this case because they are uniquely sized and shaped to fit the exact dimensions of the affected extremities with deformities, and to provide accurate and consistent gradient compression and containment, essential to optimally managing this patient's symptoms of chronic, progressive lymphedema. Multiple custom compression garments are needed for optimal hygiene to limit infection risk. Custom compression garments should be replaced q 3-6 months When worn consistently for optimal lipo-lymphedema self-management over  time.  ASSESSMENT: CLINICAL IMPRESSION: LLE quite a bit more swollen than when last seen. Leg has obviously not been wrapped in several days. Skin is shiny and tight. Hemosiderin stain and mild redness is obvious. Provided Pt edu for LE self care, including simple self-MLD to LLE/LLQ, therapeutic lymphatic pumping exercise, and continued edu for compression wrapping. MLD to LLE well tolerated. Pt continues to demonstrate progress towards all goals. Cont as per POC.  OBJECTIVE IMPAIRMENTS: decreased knowledge of condition, decreased knowledge of use of DME, decreased mobility, increased edema, pain, and chronic lower extremity swelling and associated pain, lymphedema related skin changes ( decreased excursion, increased skin approximation) increased infection risk  ACTIVITY LIMITATIONS: sitting tolerance, standing tolerance, and functional ambulation. Basic and Instrumental ADLS ( fitting shoes, socks and lower body clothing, grooming nails, inspecting skin, performing skin care, driving, shopping, housework) leisure pursuits (walking for exercise), productive activities ( caring for others, work duties),   PARTICIPATION LIMITATIONS:  negative body image limits clothing selection and social participation  PERSONAL FACTORS: Time since onset of onset/ level of progression; exacerbating co morbidities: : HTN, CVI   REHAB POTENTIAL: Good  EVALUATION COMPLEXITY: Moderate   GOALS: Goals reviewed with patient? Yes  SHORT TERM GOALS: Target date: 4th OT Rx visit  Pt will demonstrate understanding of lymphedema precautions and prevention strategies with modified independence using a printed reference to identify at least 5 precautions and discussing how s/he may implement them into daily life to reduce risk of progression with extra time. Baseline:Max A Goal status: INITIAL  2.  Pt will be able to apply multilayer, thigh length, gradient, compression wraps to one leg at a time with modified  assistance (extra time) to decrease limb volume, to limit infection risk, and to limit lymphedema progression.  Baseline: Dependent Goal status: INITIAL  LONG  TERM GOALS: Target date: 06/12/23  Given this patient's Intake score of 81/100% on the functional outcomes FOTO tool, patient will experience an increase in function of 3 points to improve basic and instrumental ADLs performance, including lymphedema self-care.  Baseline: 81% Goal status: INITIAL  2.  Given this patient's Intake score of 20.59 % on the Lymphedema Life Impact Scale (LLIS), patient will experience a reduction of at least 5 points in her perceived level of functional impairment resulting from lymphedema to improve functional performance and quality of life (QOL). Baseline: 20.59% Goal status: INITIAL  3.  During Intensive phase CDT Pt will achieve at least 85% compliance with all lymphedema self-care home program components, including daily skin care, compression wraps and /or garments, simple self MLD and lymphatic pumping therex to habituate LE self care protocol  into ADLs for optimal LE self-management over time. Baseline: Dependent Goal status: INITIAL  4.  Pt will achieve at least a 10% volume reduction in the BLE to return limb to typical size and shape, to limit infection risk and LE progression, to decrease pain, to improve function. Baseline: Dependent Goal status: INITIAL  5.  STG: Pt will obtain proper compression garments/devices and achieve modified independence (extra time + assistive devices) with donning/doffing to optimize limb volume reductions and limit LE  progression over time. Baseline:  Goal status: INITIAL  PLAN:  PT FREQUENCY: 2x/weekand PRN  PT DURATION: other: and PRN  PLANNED INTERVENTIONS: Complete Decongestive Therapy (CDT): Therapeutic lymphatic pumping exercises, skin care simultaneously w MLD to limit infection risk, Manual lymph drainage (MLD), During Intensive Phase- multilayer  Compression bandaging; during self-management phase formulate appropriate compression garments and devices, complete anatomical measurements, fitting and assessment t of garments; Consider trial of Flexitouch ADVANCED sequential compression device as replacement for incorrect basic vaso pneumatic device. Patient/Family education, DME instructions,    PLAN FOR NEXT SESSION:  Cont Pt edu for simple self-MLD, wrapping, there ex Continue MLD to LLE/LLQ. Cont LLE knee length multilayer compression wrap   Loel Dubonnet, MS, OTR/L, CLT-LANA 04/16/23 8:58 AM

## 2023-04-16 NOTE — ED Triage Notes (Signed)
Pt reports body aches, dizziness, soreness in body, sinus pressure in head x 7 days. Had temp of 100.5 today. No Tylenol/Ibuprofen/Advil today.   Was seen by PCP the day after s/s started and was told it was a sinus infection and rx for Doxy. No COVID or other lab testing that day.

## 2023-04-19 ENCOUNTER — Ambulatory Visit: Payer: 59 | Admitting: Occupational Therapy

## 2023-04-21 ENCOUNTER — Encounter: Payer: Self-pay | Admitting: Family Medicine

## 2023-04-22 ENCOUNTER — Ambulatory Visit: Payer: 59 | Attending: Vascular Surgery | Admitting: Occupational Therapy

## 2023-04-22 ENCOUNTER — Ambulatory Visit (INDEPENDENT_AMBULATORY_CARE_PROVIDER_SITE_OTHER): Payer: 59 | Admitting: Family Medicine

## 2023-04-22 ENCOUNTER — Encounter: Payer: Self-pay | Admitting: Family Medicine

## 2023-04-22 ENCOUNTER — Encounter: Payer: Self-pay | Admitting: Occupational Therapy

## 2023-04-22 VITALS — BP 118/78 | HR 52 | Ht 63.0 in | Wt 171.0 lb

## 2023-04-22 DIAGNOSIS — R42 Dizziness and giddiness: Secondary | ICD-10-CM

## 2023-04-22 DIAGNOSIS — R5383 Other fatigue: Secondary | ICD-10-CM | POA: Diagnosis not present

## 2023-04-22 DIAGNOSIS — I890 Lymphedema, not elsewhere classified: Secondary | ICD-10-CM | POA: Insufficient documentation

## 2023-04-22 NOTE — Patient Instructions (Signed)

## 2023-04-22 NOTE — Progress Notes (Signed)
Date:  04/22/2023   Name:  Kelsey Terry   DOB:  [birth year]   MRN:  [id number]   Chief Complaint: Dizziness (Had several episodes of dizziness- went to urgent care and they thought it was viral. Refer to ENT)  Dizziness This is a new problem. The current episode started 1 to 4 weeks ago (10-14 days). The problem occurs intermittently. The problem has been gradually improving. Associated symptoms include fatigue. Pertinent negatives include no abdominal pain, chest pain, chills, coughing, diaphoresis, fever, headaches, nausea, numbness, sore throat or weakness. Nothing aggravates the symptoms.  Thyroid Problem Presents for follow-up visit. Symptoms include cold intolerance and fatigue. Patient reports no diaphoresis, diarrhea, dry skin, hair loss, heat intolerance, leg swelling, nail problem, palpitations, weight gain or weight loss.    Lab Results  Component Value Date   NA 142 11/06/2022   K 3.5 11/06/2022   CO2 26 11/06/2022   GLUCOSE 83 11/06/2022   BUN 16 11/06/2022   CREATININE 1.11 (H) 11/06/2022   CALCIUM 9.9 11/06/2022   EGFR 56 (L) 11/06/2022   GFRNONAA 60 (L) 06/27/2020   Lab Results  Component Value Date   CHOL 152 03/01/2022   HDL 57 03/01/2022   LDLCALC 84 03/01/2022   TRIG 53 03/01/2022   CHOLHDL 2.9 01/20/2020   Lab Results  Component Value Date   TSH 1.350 11/20/2018   Lab Results  Component Value Date   HGBA1C 6.0 (H) 11/06/2022   Lab Results  Component Value Date   WBC 5.3 06/27/2020   HGB 14.0 06/27/2020   HCT 44.5 06/27/2020   MCV 84.6 06/27/2020   PLT 210 06/27/2020   Lab Results  Component Value Date   ALT 17 03/01/2022   AST 24 03/01/2022   ALKPHOS 56 03/01/2022   BILITOT 0.7 03/01/2022   No results found for: "25OHVITD2", "25OHVITD3", "VD25OH"   Review of Systems  Constitutional:  Positive for fatigue. Negative for chills, diaphoresis, fever, weight gain and weight loss.  HENT:  Negative for sore throat.   Respiratory:  Negative for  cough, chest tightness and shortness of breath.   Cardiovascular:  Negative for chest pain and palpitations.  Gastrointestinal:  Negative for abdominal pain, diarrhea and nausea.  Endocrine: Positive for cold intolerance. Negative for heat intolerance, polydipsia and polyuria.  Neurological:  Positive for dizziness. Negative for weakness, numbness and headaches.  Hematological:  Negative for adenopathy. Does not bruise/bleed easily.    Patient Active Problem List   Diagnosis Date Noted   Cough 03/31/2022   Hyperlipidemia 03/06/2021   Familial hypercholesterolemia 08/23/2020   Acute bilateral knee pain 01/13/2020   Health care maintenance 11/24/2019   H/O elevated lipids 11/04/2019   Lymphedema 08/26/2019   Chronic venous insufficiency 08/26/2019   Encounter for screening colonoscopy    Polyp of sigmoid colon    Polyp of ascending colon    Lymphedema of left leg 06/16/2019   Prediabetes 06/16/2019   Abnormal laboratory test result 06/16/2019   Prediabetes 05/20/2019   Essential hypertension 05/19/2019   Abscess of left buttock 05/05/2019   Essential hypertension 05/05/2019   CAD (coronary artery disease) 05/05/2019   NSTEMI (non-ST elevated myocardial infarction) (HCC) 05/16/2018    Allergies  Allergen Reactions   Dorzolamide Other (See Comments)    Chest discomfort   Latanoprost Other (See Comments)    Chest discomfort   Timolol Other (See Comments)    Chest discomfort   Amoxicillin Itching and Other (See Comments)    Reaction: bump  in vaginal region Has patient had a PCN reaction causing immediate rash, facial/tongue/throat swelling, SOB or lightheadedness with hypotension: No Has patient had a PCN reaction causing severe rash involving mucus membranes or skin necrosis: No Has patient had a PCN reaction that required hospitalization: No Has patient had a PCN reaction occurring within the last 10 years: No If all of the above answers are "NO", then may proceed with  Cephalosporin use.    Erythromycin Hives and Rash    Past Surgical History:  Procedure Laterality Date   ABDOMINAL HYSTERECTOMY     CESAREAN SECTION     COLONOSCOPY WITH PROPOFOL N/A 08/13/2019   Procedure: COLONOSCOPY WITH BIOPSIES;  Surgeon: Midge Minium, MD;  Location: [facility];  Service: Endoscopy;  Laterality: N/A;   CORONARY STENT INTERVENTION N/A 05/16/2018   Procedure: CORONARY STENT INTERVENTION;  Surgeon: Iran Ouch, MD;  Location: [facility] INVASIVE CV [facility];  Service: Cardiovascular;  Laterality: N/A;   CORONARY STENT INTERVENTION N/A 05/19/2018   Procedure: CORONARY STENT INTERVENTION;  Surgeon: Iran Ouch, MD;  Location: [facility] INVASIVE CV LAB;  Service: Cardiovascular;  Laterality: N/A;   LEFT HEART CATH AND CORONARY ANGIOGRAPHY N/A 05/16/2018   Procedure: LEFT HEART CATH AND CORONARY ANGIOGRAPHY;  Surgeon: Iran Ouch, MD;  Location: [facility] INVASIVE CV LAB;  Service: Cardiovascular;  Laterality: N/A;   PERICARDIOCENTESIS     POLYPECTOMY N/A 08/13/2019   Procedure: POLYPECTOMY;  Surgeon: Midge Minium, MD;  Location: [facility];  Service: Endoscopy;  Laterality: N/A;    Social History   Tobacco Use   Smoking status: Never   Smokeless tobacco: Never  Vaping Use   Vaping Use: Never used  Substance Use Topics   Alcohol use: Never   Drug use: Never     Medication list has been reviewed and updated.  Current Meds  Medication Sig   aspirin EC (ASPIRIN LOW DOSE) 81 MG tablet Take 1 tablet (81 mg total) by mouth daily. Swallow whole.   Blood Pressure KIT 1 kit by Does not apply route daily.   CALCIUM CITRATE-VITAMIN D PO Take 1 tablet by mouth 2 (two) times daily.   chlorthalidone (HYGROTON) 25 MG tablet TAKE 1/2 TABLET(12.5MG      TOTAL) DAILY.   ipratropium (ATROVENT) 0.06 % nasal spray Place 2 sprays into both nostrils 4 (four) times daily.   losartan (COZAAR) 100 MG tablet Take 1 tablet (100 mg total) by mouth daily.   metFORMIN  (GLUCOPHAGE) 500 MG tablet Take 1 tablet (500 mg total) by mouth daily.   Multiple Vitamin (MULTIVITAMIN) tablet Take 1 tablet by mouth daily.   potassium chloride (KLOR-CON) 10 MEQ tablet Take 2 tablets (20 mEq total) by mouth daily.   rosuvastatin (CRESTOR) 40 MG tablet Take 1 tablet (40 mg total) by mouth daily.       04/22/2023    4:36 PM 07/05/2022    3:15 PM 05/10/2022    4:16 PM 03/01/2022   10:04 AM  GAD 7 : Generalized Anxiety Score  Nervous, Anxious, on Edge 0 0 0 0  Control/stop worrying 0 0 0 0  Worry too much - different things 0 0 0 0  Trouble relaxing 0 0 0 0  Restless 0 0 0 0  Easily annoyed or irritable 0 0 0 0  Afraid - awful might happen 0 0 0 0  Total GAD 7 Score 0 0 0 0  Anxiety Difficulty Not difficult at all Not difficult at all Not difficult at all  Not difficult at all       04/22/2023    4:36 PM 07/05/2022    3:15 PM 05/10/2022    4:16 PM  Depression screen PHQ 2/9  Decreased Interest 0 0 0  Down, Depressed, Hopeless 0 0 0  PHQ - 2 Score 0 0 0  Altered sleeping 0 0 0  Tired, decreased energy 0 0 0  Change in appetite 0 0 0  Feeling bad or failure about yourself  0 0 0  Trouble concentrating 0 0 0  Moving slowly or fidgety/restless 0 0 0  Suicidal thoughts 0 0   PHQ-9 Score 0 0 0  Difficult doing work/chores Not difficult at all Not difficult at all Not difficult at all    BP Readings from Last 3 Encounters:  04/22/23 118/78  04/16/23 113/73  04/10/23 112/76    Physical Exam Vitals and nursing note reviewed.  Constitutional:      Appearance: She is well-developed.  HENT:     Head: Normocephalic.     Right Ear: Tympanic membrane and external ear normal.     Left Ear: Tympanic membrane and external ear normal.     Nose: Nose normal.     Mouth/Throat:     Mouth: Mucous membranes are moist.  Eyes:     General: Lids are everted, no foreign bodies appreciated. No scleral icterus.       Left eye: No foreign body or hordeolum.      Conjunctiva/sclera: Conjunctivae normal.     Right eye: Right conjunctiva is not injected.     Left eye: Left conjunctiva is not injected.     Pupils: Pupils are equal, round, and reactive to light.  Neck:     Thyroid: No thyromegaly.     Vascular: No JVD.     Trachea: No tracheal deviation.  Cardiovascular:     Rate and Rhythm: Normal rate and regular rhythm.     Heart sounds: Normal heart sounds. No murmur heard.    No friction rub. No gallop.  Pulmonary:     Effort: Pulmonary effort is normal. No respiratory distress.     Breath sounds: Normal breath sounds. No wheezing, rhonchi or rales.  Abdominal:     General: Bowel sounds are normal.     Palpations: Abdomen is soft. There is no mass.     Tenderness: There is no abdominal tenderness. There is no guarding or rebound.  Musculoskeletal:        General: No tenderness. Normal range of motion.     Cervical back: Normal range of motion and neck supple.  Lymphadenopathy:     Cervical: No cervical adenopathy.  Skin:    General: Skin is warm.     Findings: No rash.  Neurological:     Mental Status: She is alert and oriented to person, place, and time.     Cranial Nerves: Cranial nerves 2-12 are intact. No cranial nerve deficit.     Sensory: Sensation is intact. No sensory deficit.     Motor: Motor function is intact. No weakness.     Coordination: Romberg sign negative.     Deep Tendon Reflexes: Reflexes normal.  Psychiatric:        Mood and Affect: Mood is not anxious or depressed.     Wt Readings from Last 3 Encounters:  04/22/23 171 lb (77.6 kg)  04/10/23 174 lb (78.9 kg)  02/14/23 174 lb 6.4 oz (79.1 kg)    BP 118/78   Pulse Marland Kitchen)  52   Ht 5\' 3"  (1.6 m)   Wt 171 lb (77.6 kg)   SpO2 97%   BMI 30.29 kg/m   Assessment and Plan: 1. Dizziness Patient with episodes of dizziness that is either of a vasovagal nature or vertigo.  In addition to referral to ear nose and throat for evaluation we will check CBC renal function  panel and thyroid panel.  Patient admits to no palpitations or chest discomfort but does have a cardiologist that we may be involving after receiving lab work. - CBC w/Diff/Platelet - Ambulatory referral to ENT - Renal Function Panel - Thyroid Panel With TSH  2. Fatigue, unspecified type As noted above patient is also having fatigue but this was associated with a 2-week to 10-day circumstances that patient had a virus with fever initially treated with antibiotics it is gradually been recovering and is almost back to baseline.  We will check in the meantime to make sure there is no residual concern by checking a CBC renal function panel and thyroid panel. - CBC w/Diff/Platelet - Renal Function Panel - Thyroid Panel With TSH     Elizabeth Sauer, MD

## 2023-04-22 NOTE — Therapy (Signed)
OUTPATIENT OCCUPATIONAL THERAPY TREATMENT LOWER EXTREMITY LYMPHEDEMA  Patient Name: Kelsey Terry MRN: [id number] DOB:1960/12/10, 62 y.o., female Today's Date: 03/22/2023  END OF SESSION:   OT End of Session - 04/22/23 0923     Visit Number 6    Number of Visits 36    Date for OT Re-Evaluation 06/12/23    OT Start Time 0915    OT Stop Time 1005    OT Time Calculation (min) 50 min    Activity Tolerance Patient tolerated treatment well;No increased pain    Behavior During Therapy [facility] for tasks assessed/performed               Past Medical History:  Diagnosis Date   Allergies    Asthma    Blood dyscrasia    blood clot in right leg   CAD (coronary artery disease) 05/05/2019   CAD in native artery    a. NSTEMI 05/16/18: LHC 05/16/18 - ost-pLAD 95% s/p PCI/DES, mLAD 30%, ostD1 70%, ost-pLCx 30%, mRCA 90%, dRCA 20%; b. staged PCI/DES to [facility] 7/1   Complication of anesthesia    patient stated she had "some kind of reaction to anesthesia, "thought she was having a stroke" during her second cath procedure.   Diabetes mellitus without complication (HCC)    pre-diabetes   Diastolic dysfunction    a. TTE 05/16/18: EF 55-60%, no RWMA, Gr1DD, trivial AI, calcified mitral annulus, mild MR, mildly dilated LA   Dyspnea    with activity   Hypertension    Lymphedema    LLE   Pericarditis    a. ~ 2012 in [place], [place] s/p pericardiocentesis    Wears contact lenses    Past Surgical History:  Procedure Laterality Date   ABDOMINAL HYSTERECTOMY     CESAREAN SECTION     COLONOSCOPY WITH PROPOFOL N/A 08/13/2019   Procedure: COLONOSCOPY WITH BIOPSIES;  Surgeon: Midge Minium, MD;  Location: [facility];  Service: Endoscopy;  Laterality: N/A;   CORONARY STENT INTERVENTION N/A 05/16/2018   Procedure: CORONARY STENT INTERVENTION;  Surgeon: Iran Ouch, MD;  Location: [facility] INVASIVE CV LAB;  Service: Cardiovascular;  Laterality: N/A;   CORONARY STENT INTERVENTION N/A 05/19/2018    Procedure: CORONARY STENT INTERVENTION;  Surgeon: Iran Ouch, MD;  Location: [facility] IN[facility] CV [facility];  Service: Cardiovascular;  Laterality: N/A;   LEFT HEART CATH AND CORONARY ANGIOGRAPHY N/A 05/16/2018   Procedure: LEFT HEART CATH AND CORONARY ANGIOGRAPHY;  Surgeon: Iran Ouch, MD;  Location: [facility] INVAS[facility] CV [facility];  Service: Cardiovascular;  Laterality: N/A;   PERICARDIOCENTESIS     POLYPECTOMY N/A 08/13/2019   Procedure: POLYPECTOMY;  Surgeon: Midge Minium, MD;  Location: [facility];  Service: Endoscopy;  Laterality: N/A;   Patient Active Problem List   Diagnosis Date Noted   Cough 03/31/2022   Hyperlipidemia 03/06/2021   Familial hypercholesterolemia 08/23/2020   Acute bilateral knee pain 01/13/2020   Health care maintenance 11/24/2019   H/O elevated lipids 11/04/2019   Lymphedema 08/26/2019   Chronic venous insufficiency 08/26/2019   Encounter for screening colonoscopy    Polyp of sigmoid colon    Polyp of ascending colon    Lymphedema of left leg 06/16/2019   Prediabetes 06/16/2019   Abnormal laboratory test result 06/16/2019   Prediabetes 05/20/2019   Essential hypertension 05/19/2019   Abscess of left buttock 05/05/2019   Essential hypertension 05/05/2019   CAD (coronary artery disease) 05/05/2019   NSTEMI (non-ST elevated myocardial infarction) (HCC) 05/16/2018    PCP:  Duanne Limerick, MD  REFERRING PROVIDER: Levora Dredge, MD  REFERRING DIAG: I89.0  THERAPY DIAG:  Lymphedema, not elsewhere classified  Rationale for Evaluation and Treatment: Rehabilitation  ONSET DATE: 03/14/23  SUBJECTIVE:                                                                                                                                                                                           SUBJECTIVE STATEMENT: Kelsey Terry presents for OT visit to address BLE lymphedema, L>R. Pt denies LE-related leg pain. She denies arthritis knee pain.  PERTINENT HISTORY: LE,  CVI, HTN, Prediabetes, CAD, s/p NSTEMI 04/2028  PAIN: Are you having lymphedema -related  pain? No  PRECAUTIONS: Other: LYMPHEDEMA PRECAUTIONS: CARDIAC, DM- skin  FALLS: Has patient fallen since last visit? No  HAND DOMINANCE: right   PRIOR LEVEL OF FUNCTION: Independent  PATIENT GOALS: to be able to do something better to take care of my legs; to learn about any new management technology; to keep the lymphedema from getting worse."   OBJECTIVE:  OBSERVATIONS / OTHER ASSESSMENTS: Moderate, Stage  II, Primary, Bilateral Lower Extremity Lymphedema, L>R (Lymphedema Praecox)  FOTO functional outcome score: Intake 03/14/23: 81%  Lymphedema Life Impact Scale (LLIS) Intake 03/14/23: 20.59% (The extent to which lymphedema -related problems impacted your life in the past week)    BLE COMPARATIVE LIMB VOLUMETRICS Intake Visit 1 03/21/23  LANDMARK RIGHT    R LEG (A-D) 3627.8 ml  R THIGH (E-G) 6402.0 ml  R FULL LIMB (A-G) 10029.8 ml  Limb Volume differential (LVD)  %  Volume change since initial %  Volume change overall V  (Blank rows = not tested)  LANDMARK LEFT    L LEG (A-D) 4875.0 ml  L THIGH (E-G) 6129.6 ml  L  FULL LIMB (A-G) 11004.6 ml  Limb Volume differential (LVD)  Leg LVD = 25.6%, L>R Thigh LVD = 4.41%, R (dominant)>L Full Limb LVD = 8.85%, L>R%  Volume change since initial %  Volume change overall %  (Blank rows = not tested)    TODAY'S TREATMENT:  MLD to LLE/LLQ  Multilayer, knee length, LLE compression wrap  PATIENT EDUCATION:  Continued Pt/ CG edu for lymphedema self care home program throughout session. Topics include outcome of comparative limb volumetrics- starting limb volume differentials (LVDs), technology and gradient techniques used for short stretch, multilayer compression wrapping, simple self-MLD, therapeutic lymphatic pumping  exercises, skin/nail care, LE precautions,. compression garment recommendations and specifications, wear and care schedule and compression garment donning / doffing w assistive devices. Discussed progress towards all OT goals since commencing CDT. All questions answered to the Pt's satisfaction. Good return.  Person educated: Patient Education method: Explanation, Demonstration, and Handouts Education comprehension: verbalized understanding, returned demonstration, and needs further education  LE SELF-CARE HOME PROGRAM: BLE Lymphatic Pumping There ex Skin care to limit infection risk Compression Intensive stage compression: multilayer short stretch wraps with gradient techniques. One limb at a time. Self-management Phase Compression: Custom, flat knit, BLE compression knee highs. Consider Elvarex classic for daytime and Jobst Relax for HOS Simple self-MLD LLE/LLQ MLD utilizing short neck sequence, deep, diaphragmatic breathing, stationary J strokes to functional inguinal LN at L groin, then dynamic J strokes from proximal to distal from thigh, knee, leg and foot, with 3 sweeps to terminus to complete session.   Custom-made gradient compression garments and HOS devices are medically necessary in this case because they are uniquely sized and shaped to fit the exact dimensions of the affected extremities with deformities, and to provide accurate and consistent gradient compression and containment, essential to optimally managing this patient's symptoms of chronic, progressive lymphedema. Multiple custom compression garments are needed for optimal hygiene to limit infection risk. Custom compression garments should be replaced q 3-6 months When worn consistently for optimal lipo-lymphedema self-management over time.  ASSESSMENT: CLINICAL IMPRESSION: Limb volume appears unchanged since last session. Tissue integrity and generalized fibrosis below the knee is unchanged to palpation. Pt reports compression  wrapping between visits is going well. Continued MLD to LLE/LLQ utilizing established sequence. Pt continues to demonstrate slow response to CDT. Cont as per POC.  OBJECTIVE IMPAIRMENTS: decreased knowledge of condition, decreased knowledge of use of DME, decreased mobility, increased edema, pain, and chronic lower extremity swelling and associated pain, lymphedema related skin changes ( decreased excursion, increased skin approximation) increased infection risk  ACTIVITY LIMITATIONS: sitting tolerance, standing tolerance, and functional ambulation. Basic and Instrumental ADLS ( fitting shoes, socks and lower body clothing, grooming nails, inspecting skin, performing skin care, driving, shopping, housework) leisure pursuits (walking for exercise), productive activities ( caring for others, work duties),   PARTICIPATION LIMITATIONS:  negative body image limits clothing selection and social participation  PERSONAL FACTORS: Time since onset of onset/ level of progression; exacerbating co morbidities: : HTN, CVI   REHAB POTENTIAL: Good  EVALUATION COMPLEXITY: Moderate   GOALS: Goals reviewed with patient? Yes  SHORT TERM GOALS: Target date: 4th OT Rx visit  Pt will demonstrate understanding of lymphedema precautions and prevention strategies with modified independence using a printed reference to identify at least 5 precautions and discussing how s/he may implement them into daily life to reduce risk of progression with extra time. Baseline:Max A Goal status: INITIAL  2.  Pt will be able to apply multilayer, thigh length, gradient, compression wraps to one leg at a time with modified assistance (extra time) to decrease limb volume, to limit infection risk, and to limit lymphedema progression.  Baseline: Dependent Goal status: GOAL MET  LONG TERM GOALS: Target date: 06/12/23  Given this patient's Intake score of 81/100% on the functional outcomes FOTO tool, patient will experience an increase  in  function of 3 points to improve basic and instrumental ADLs performance, including lymphedema self-care.  Baseline: 81% Goal status: INITIAL  2.  Given this patient's Intake score of 20.59 % on the Lymphedema Life Impact Scale (LLIS), patient will experience a reduction of at least 5 points in her perceived level of functional impairment resulting from lymphedema to improve functional performance and quality of life (QOL). Baseline: 20.59% Goal status: INITIAL  3.  During Intensive phase CDT Pt will achieve at least 85% compliance with all lymphedema self-care home program components, including daily skin care, compression wraps and /or garments, simple self MLD and lymphatic pumping therex to habituate LE self care protocol  into ADLs for optimal LE self-management over time. Baseline: Dependent Goal status: INITIAL  4.  Pt will achieve at least a 10% volume reduction in the BLE to return limb to typical size and shape, to limit infection risk and LE progression, to decrease pain, to improve function. Baseline: Dependent Goal status: INITIAL  5.  STG: Pt will obtain proper compression garments/devices and achieve modified independence (extra time + assistive devices) with donning/doffing to optimize limb volume reductions and limit LE  progression over time. Baseline:  Goal status: INITIAL  PLAN:  PT FREQUENCY: 2x/weekand PRN  PT DURATION: other: and PRN  PLANNED INTERVENTIONS: Complete Decongestive Therapy (CDT): Therapeutic lymphatic pumping exercises, skin care simultaneously w MLD to limit infection risk, Manual lymph drainage (MLD), During Intensive Phase- multilayer Compression bandaging; during self-management phase formulate appropriate compression garments and devices, complete anatomical measurements, fitting and assessment t of garments; Consider trial of Flexitouch ADVANCED sequential compression device as replacement for incorrect basic vaso pneumatic device. Patient/Family  education, DME instructions,    PLAN FOR NEXT SESSION:  Cont Pt edu for simple self-MLD, wrapping, there ex Continue MLD to LLE/LLQ. Cont LLE knee length multilayer compression wrap   Loel Dubonnet, Kelsey, OTR/L, CLT-LANA 04/22/23 12:55 PM

## 2023-04-23 DIAGNOSIS — R5383 Other fatigue: Secondary | ICD-10-CM | POA: Diagnosis not present

## 2023-04-23 DIAGNOSIS — R42 Dizziness and giddiness: Secondary | ICD-10-CM | POA: Diagnosis not present

## 2023-04-24 ENCOUNTER — Ambulatory Visit: Payer: 59 | Admitting: Occupational Therapy

## 2023-04-24 DIAGNOSIS — I890 Lymphedema, not elsewhere classified: Secondary | ICD-10-CM | POA: Diagnosis not present

## 2023-04-24 LAB — CBC WITH DIFFERENTIAL/PLATELET
Basophils Absolute: 0.1 10*3/uL (ref 0.0–0.2)
Basos: 2 %
EOS (ABSOLUTE): 0 10*3/uL (ref 0.0–0.4)
Eos: 1 %
Hematocrit: 39.4 % (ref 34.0–46.6)
Hemoglobin: 12.4 g/dL (ref 11.1–15.9)
Immature Grans (Abs): 0 10*3/uL (ref 0.0–0.1)
Immature Granulocytes: 0 %
Lymphocytes Absolute: 1.6 10*3/uL (ref 0.7–3.1)
Lymphs: 50 %
MCH: 26.7 pg (ref 26.6–33.0)
MCHC: 31.5 g/dL (ref 31.5–35.7)
MCV: 85 fL (ref 79–97)
Monocytes Absolute: 0.4 10*3/uL (ref 0.1–0.9)
Monocytes: 12 %
Neutrophils Absolute: 1.1 10*3/uL — ABNORMAL LOW (ref 1.4–7.0)
Neutrophils: 35 %
Platelets: 169 10*3/uL (ref 150–450)
RBC: 4.65 x10E6/uL (ref 3.77–5.28)
RDW: 13.5 % (ref 11.7–15.4)
WBC: 3.2 10*3/uL — ABNORMAL LOW (ref 3.4–10.8)

## 2023-04-24 LAB — RENAL FUNCTION PANEL
Albumin: 4 g/dL (ref 3.9–4.9)
BUN/Creatinine Ratio: 14 (ref 12–28)
BUN: 14 mg/dL (ref 8–27)
CO2: 28 mmol/L (ref 20–29)
Calcium: 9.9 mg/dL (ref 8.7–10.3)
Chloride: 103 mmol/L (ref 96–106)
Creatinine, Ser: 0.99 mg/dL (ref 0.57–1.00)
Glucose: 86 mg/dL (ref 70–99)
Phosphorus: 3.2 mg/dL (ref 3.0–4.3)
Potassium: 3.7 mmol/L (ref 3.5–5.2)
Sodium: 143 mmol/L (ref 134–144)
eGFR: 64 mL/min/{1.73_m2} (ref >59)

## 2023-04-24 LAB — THYROID PANEL WITH TSH
Free Thyroxine Index: 2.2 (ref 1.2–4.9)
T3 Uptake Ratio: 26 % (ref 24–39)
T4, Total: 8.3 ug/dL (ref 4.5–12.0)
TSH: 1.49 u[IU]/mL (ref 0.450–4.500)

## 2023-04-24 NOTE — Therapy (Deleted)
OUTPATIENT OCCUPATIONAL THERAPY TREATMENT LOWER EXTREMITY LYMPHEDEMA  Patient Name: Kelsey Terry MRN: [id number] DOB:[birth year], 62 y.o., female Today's Date: 04/24/2023  END OF SESSION:   OT End of Session - 04/24/23 0915     Visit Number 7    Number of Visits 36    Date for OT Re-Evaluation 06/12/23    OT Start Time 0905    OT Stop Time 1005    OT Time Calculation (min) 60 min    Activity Tolerance Patient tolerated treatment well;No increased pain    Behavior During Therapy [facility] for tasks assessed/performed               Past Medical History:  Diagnosis Date   Allergies    Asthma    Blood dyscrasia    blood clot in right leg   CAD (coronary artery disease) 05/05/2019   CAD in native artery    a. NSTEMI 05/16/18: LHC 05/16/18 - ost-pLAD 95% s/p PCI/DES, mLAD 30%, ostD1 70%, ost-pLCx 30%, mRCA 90%, dRCA 20%; b. staged PCI/DES to [facility] 7/1   Complication of anesthesia    patient stated she had "some kind of reaction to anesthesia, "thought she was having a stroke" during her second cath procedure.   Diabetes mellitus without complication (HCC)    pre-diabetes   Diastolic dysfunction    a. TTE 05/16/18: EF 55-60%, no RWMA, Gr1DD, trivial AI, calcified mitral annulus, mild MR, mildly dilated LA   Dyspnea    with activity   Hypertension    Lymphedema    LLE   Pericarditis    a. ~ 2012 in [place], [place] s/p pericardiocentesis    Wears contact lenses    Past Surgical History:  Procedure Laterality Date   ABDOMINAL HYSTERECTOMY     CESAREAN SECTION     COLONOSCOPY WITH PROPOFOL N/A 08/13/2019   Procedure: COLONOSCOPY WITH BIOPSIES;  Surgeon: Midge Minium, MD;  Location: [facility];  Service: Endoscopy;  Laterality: N/A;   CORONARY STENT INTERVENTION N/A 05/16/2018   Procedure: CORONARY STENT INTERVENTION;  Surgeon: Iran Ouch, MD;  Location: [facility] INVASIVE CV LAB;  Service: Cardiovascular;  Laterality: N/A;   CORONARY STENT INTERVENTION N/A 05/19/2018    Procedure: CORONARY STENT INTERVENTION;  Surgeon: Iran Ouch, MD;  Location: [facility] INVASIVE CV LAB;  Service: Cardiovascular;  Laterality: N/A;   LEFT HEART CATH AND CORONARY ANGIOGRAPHY N/A 05/16/2018   Procedure: LEFT HEART CATH AND CORONARY ANGIOGRAPHY;  Surgeon: Iran Ouch, MD;  Location: [facility] INVASIVE CV LAB;  Service: Cardiovascular;  Laterality: N/A;   PERICARDIOCENTESIS     POLYPECTOMY N/A 08/13/2019   Procedure: POLYPECTOMY;  Surgeon: Midge Minium, MD;  Location: [facility];  Service: Endoscopy;  Laterality: N/A;   Patient Active Problem List   Diagnosis Date Noted   Cough 03/31/2022   Hyperlipidemia 03/06/2021   Familial hypercholesterolemia 08/23/2020   Acute bilateral knee pain 01/13/2020   Health care maintenance 11/24/2019   H/O elevated lipids 11/04/2019   Lymphedema 08/26/2019   Chronic venous insufficiency 08/26/2019   Encounter for screening colonoscopy    Polyp of sigmoid colon    Polyp of ascending colon    Lymphedema of left leg 06/16/2019   Prediabetes 06/16/2019   Abnormal laboratory test result 06/16/2019   Prediabetes 05/20/2019   Essential hypertension 05/19/2019   Abscess of left buttock 05/05/2019   Essential hypertension 05/05/2019   CAD (coronary artery disease) 05/05/2019   NSTEMI (non-ST elevated myocardial infarction) (HCC) 05/16/2018    PCP:  Duanne Limerick, MD  REFERRING PROVIDER: Levora Dredge, MD  REFERRING DIAG: I89.0  THERAPY DIAG:  Lymphedema, not elsewhere classified  Rationale for Evaluation and Treatment: Rehabilitation  ONSET DATE: 03/14/23  SUBJECTIVE:                                                                                                                                                                                           SUBJECTIVE STATEMENT: Kelsey Terry presents for OT visit to address BLE lymphedema, L>R. Pt denies LE-related leg pain this morning. She denies arthritis knee pain as well, and has  no new complaints.Pt presents with compression wraps in place on the R leg and foot.   PERTINENT HISTORY: LE, CVI, HTN, Prediabetes, CAD, s/p NSTEMI 04/2028  PAIN: Are you having lymphedema -related  pain? No  PRECAUTIONS: Other: LYMPHEDEMA PRECAUTIONS: CARDIAC, DM- skin  FALLS: Has patient fallen since last visit? No  HAND DOMINANCE: right   PRIOR LEVEL OF FUNCTION: Independent  PATIENT GOALS: to be able to do something better to take care of my legs; to learn about any new management technology; to keep the lymphedema from getting worse."   OBJECTIVE:Moderate, Stage  II, Primary, Bilateral Lower Extremity Lymphedema, L>R (Lymphedema Praecox)  OBSERVATIONS / OTHER ASSESSMENTS: FOTO functional outcome score: Intake 03/14/23: 81%  Lymphedema Life Impact Scale (LLIS) Intake 03/14/23: 20.59% (The extent to which lymphedema -related problems impacted your life in the past week)  BLE COMPARATIVE LIMB VOLUMETRICS Intake Visit 1 03/21/23  LANDMARK RIGHT    R LEG (A-D) 3627.8 ml  R THIGH (E-G) 6402.0 ml  R FULL LIMB (A-G) 10029.8 ml  Limb Volume differential (LVD)  %  Volume change since initial %  Volume change overall V  (Blank rows = not tested)  LANDMARK LEFT    L LEG (A-D) 4875.0 ml  L THIGH (E-G) 6129.6 ml  L  FULL LIMB (A-G) 11004.6 ml  Limb Volume differential (LVD)  Leg LVD = 25.6%, L>R Thigh LVD = 4.41%, R (dominant)>L Full Limb LVD = 8.85%, L>R%  Volume change since initial %  Volume change overall %  (Blank rows = not tested)    TODAY'S TREATMENT:  MLD to LLE/LLQ  Multilayer, knee length, LLE compression wrap  PATIENT EDUCATION:  Continued Pt/ CG edu for lymphedema self care home program throughout session. Topics include outcome of comparative limb volumetrics- starting limb volume differentials (LVDs), technology and gradient techniques  used for short stretch, multilayer compression wrapping, simple self-MLD, therapeutic lymphatic pumping exercises, skin/nail care, LE precautions,. compression garment recommendations and specifications, wear and care schedule and compression garment donning / doffing w assistive devices. Discussed progress towards all OT goals since commencing CDT. All questions answered to the Pt's satisfaction. Good return.  Person educated: Patient Education method: Explanation, Demonstration, and Handouts Education comprehension: verbalized understanding, returned demonstration, and needs further education  LE SELF-CARE HOME PROGRAM: BLE Lymphatic Pumping There ex Skin care to limit infection risk Compression Intensive stage compression: multilayer short stretch wraps with gradient techniques. One limb at a time. Self-management Phase Compression: Custom, flat knit, BLE compression knee highs. Consider Elvarex classic for daytime and Jobst Relax for HOS Simple self-MLD LLE/LLQ MLD utilizing short neck sequence, deep, diaphragmatic breathing, stationary J strokes to functional inguinal LN at L groin, then dynamic J strokes from proximal to distal from thigh, knee, leg and foot, with 3 sweeps to terminus to complete session.   Custom-made gradient compression garments and HOS devices are medically necessary in this case because they are uniquely sized and shaped to fit the exact dimensions of the affected extremities with deformities, and to provide accurate and consistent gradient compression and containment, essential to optimally managing this patient's symptoms of chronic, progressive lymphedema. Multiple custom compression garments are needed for optimal hygiene to limit infection risk. Custom compression garments should be replaced q 3-6 months When worn consistently for optimal lipo-lymphedema self-management over time.  ASSESSMENT: CLINICAL IMPRESSION: Limb volume appears unchanged since last session.  Tissue integrity and generalized fibrosis below the knee is unchanged to palpation. Pt reports compression wrapping between visits is going well. Continued MLD to LLE/LLQ utilizing established sequence. Pt continues to demonstrate slow response to CDT, although=gh reduction in dorsal foot swelling is obvious at end of session. Appliee compression wraps for the first time today in effort to better control this symptom. Cont as per POC.  OBJECTIVE IMPAIRMENTS: decreased knowledge of condition, decreased knowledge of use of DME, decreased mobility, increased edema, pain, and chronic lower extremity swelling and associated pain, lymphedema related skin changes ( decreased excursion, increased skin approximation) increased infection risk  ACTIVITY LIMITATIONS: sitting tolerance, standing tolerance, and functional ambulation. Basic and Instrumental ADLS ( fitting shoes, socks and lower body clothing, grooming nails, inspecting skin, performing skin care, driving, shopping, housework) leisure pursuits (walking for exercise), productive activities ( caring for others, work duties),   PARTICIPATION LIMITATIONS:  negative body image limits clothing selection and social participation  PERSONAL FACTORS: Time since onset of onset/ level of progression; exacerbating co morbidities: : HTN, CVI   REHAB POTENTIAL: Good  EVALUATION COMPLEXITY: Moderate   GOALS: Goals reviewed with patient? Yes  SHORT TERM GOALS: Target date: 4th OT Rx visit  Pt will demonstrate understanding of lymphedema precautions and prevention strategies with modified independence using a printed reference to identify at least 5 precautions and discussing how s/he may implement them into daily life to reduce risk of progression with extra time. Baseline:Max A Goal status: INITIAL  2.  Pt will be able to apply multilayer, thigh length, gradient, compression wraps to one leg at a time with modified assistance (extra time) to decrease limb  volume, to limit infection risk, and to limit lymphedema progression.  Baseline: Dependent Goal status: GOAL MET  LONG TERM GOALS: Target date: 06/12/23  Given this patient's Intake score of 81/100% on the functional outcomes FOTO tool, patient will experience an increase in function of 3 points to improve basic and instrumental ADLs performance, including lymphedema self-care.  Baseline: 81% Goal status: INITIAL  2.  Given this patient's Intake score of 20.59 % on the Lymphedema Life Impact Scale (LLIS), patient will experience a reduction of at least 5 points in her perceived level of functional impairment resulting from lymphedema to improve functional performance and quality of life (QOL). Baseline: 20.59% Goal status: INITIAL  3.  During Intensive phase CDT Pt will achieve at least 85% compliance with all lymphedema self-care home program components, including daily skin care, compression wraps and /or garments, simple self MLD and lymphatic pumping therex to habituate LE self care protocol  into ADLs for optimal LE self-management over time. Baseline: Dependent Goal status: INITIAL  4.  Pt will achieve at least a 10% volume reduction in the BLE to return limb to typical size and shape, to limit infection risk and LE progression, to decrease pain, to improve function. Baseline: Dependent Goal status: INITIAL  5.  STG: Pt will obtain proper compression garments/devices and achieve modified independence (extra time + assistive devices) with donning/doffing to optimize limb volume reductions and limit LE  progression over time. Baseline:  Goal status: INITIAL  PLAN:  PT FREQUENCY: 2x/weekand PRN  PT DURATION: other: and PRN  PLANNED INTERVENTIONS: Complete Decongestive Therapy (CDT): Therapeutic lymphatic pumping exercises, skin care simultaneously w MLD to limit infection risk, Manual lymph drainage (MLD), During Intensive Phase- multilayer Compression bandaging; during  self-management phase formulate appropriate compression garments and devices, complete anatomical measurements, fitting and assessment t of garments; Consider trial of Flexitouch ADVANCED sequential compression device as replacement for incorrect basic vaso pneumatic device. Patient/Family education, DME instructions,    PLAN FOR NEXT SESSION:  Cont Pt edu for simple self-MLD, wrapping, there ex Continue MLD to LLE/LLQ w skin care Cont LLE knee length multilayer compression wrap   Loel Dubonnet, Kelsey, OTR/L, CLT-LANA 04/24/23 10:57 AM

## 2023-04-24 NOTE — Therapy (Signed)
OUTPATIENT OCCUPATIONAL THERAPY TREATMENT LOWER EXTREMITY LYMPHEDEMA  Patient Name: Taylani Pastorek MRN: [id number] DOB:10-13-1961, 62 y.o., female Today's Date: 04/24/23  END OF SESSION:   OT End of Session - 04/24/23 0915     Visit Number 7    Number of Visits 36    Date for OT Re-Evaluation 06/12/23    OT Start Time 0905    Activity Tolerance Patient tolerated treatment well;No increased pain    Behavior During Therapy [facility] for tasks assessed/performed               Past Medical History:  Diagnosis Date   Allergies    Asthma    Blood dyscrasia    blood clot in right leg   CAD (coronary artery disease) 05/05/2019   CAD in native artery    a. NSTEMI 05/16/18: LHC 05/16/18 - ost-pLAD 95% s/p PCI/DES, mLAD 30%, ostD1 70%, ost-pLCx 30%, mRCA 90%, dRCA 20%; b. staged PCI/DES to [facility] 7/1   Complication of anesthesia    patient stated she had "some kind of reaction to anesthesia, "thought she was having a stroke" during her second cath procedure.   Diabetes mellitus without complication (HCC)    pre-diabetes   Diastolic dysfunction    a. TTE 05/16/18: EF 55-60%, no RWMA, Gr1DD, trivial AI, calcified mitral annulus, mild MR, mildly dilated LA   Dyspnea    with activity   Hypertension    Lymphedema    LLE   Pericarditis    a. ~ 2012 in [place], [place] s/p pericardiocentesis    Wears contact lenses    Past Surgical History:  Procedure Laterality Date   ABDOMINAL HYSTERECTOMY     CESAREAN SECTION     COLONOSCOPY WITH PROPOFOL N/A 08/13/2019   Procedure: COLONOSCOPY WITH BIOPSIES;  Surgeon: Midge Minium, MD;  Location: [facility];  Service: Endoscopy;  Laterality: N/A;   CORONARY STENT INTERVENTION N/A 05/16/2018   Procedure: CORONARY STENT INTERVENTION;  Surgeon: Iran Ouch, MD;  Location: [facility] INVASIVE CV LAB;  Service: Cardiovascular;  Laterality: N/A;   CORONARY STENT INTERVENTION N/A 05/19/2018   Procedure: CORONARY STENT INTERVENTION;  Surgeon: Iran Ouch, MD;  Location: [facility] INVASIVE CV LAB;  Service: Cardiovascular;  Laterality: N/A;   LEFT HEART CATH AND CORONARY ANGIOGRAPHY N/A 05/16/2018   Procedure: LEFT HEART CATH AND CORONARY ANGIOGRAPHY;  Surgeon: Iran Ouch, MD;  Location: [facility] INVASIVE CV LAB;  Service: Cardiovascular;  Laterality: N/A;   PERICARDIOCENTESIS     POLYPECTOMY N/A 08/13/2019   Procedure: POLYPECTOMY;  Surgeon: Midge Minium, MD;  Location: [facility];  Service: Endoscopy;  Laterality: N/A;   Patient Active Problem List   Diagnosis Date Noted   Cough 03/31/2022   Hyperlipidemia 03/06/2021   Familial hypercholesterolemia 08/23/2020   Acute bilateral knee pain 01/13/2020   Health care maintenance 11/24/2019   H/O elevated lipids 11/04/2019   Lymphedema 08/26/2019   Chronic venous insufficiency 08/26/2019   Encounter for screening colonoscopy    Polyp of sigmoid colon    Polyp of ascending colon    Lymphedema of left leg 06/16/2019   Prediabetes 06/16/2019   Abnormal laboratory test result 06/16/2019   Prediabetes 05/20/2019   Essential hypertension 05/19/2019   Abscess of left buttock 05/05/2019   Essential hypertension 05/05/2019   CAD (coronary artery disease) 05/05/2019   NSTEMI (non-ST elevated myocardial infarction) (HCC) 05/16/2018    PCP: Duanne Limerick, MD  REFERRING PROVIDER: Levora Dredge, MD  REFERRING DIAG: I89.0  THERAPY  DIAG:  Lymphedema, not elsewhere classified  Rationale for Evaluation and Treatment: Rehabilitation  ONSET DATE: 03/14/23  SUBJECTIVE:                                                                                                                                                                                           SUBJECTIVE STATEMENT: Ms Journell presents for OT visit to address BLE lymphedema, L>R. Pt denies LE-related leg pain. She denies arthritis knee pain. Pt has no new complaints.  PERTINENT HISTORY: LE, CVI, HTN, Prediabetes, CAD, s/p NSTEMI  04/2028  PAIN: Are you having lymphedema -related  pain? No  PRECAUTIONS: Other: LYMPHEDEMA PRECAUTIONS: CARDIAC, DM- skin  FALLS: Has patient fallen since last visit? No  HAND DOMINANCE: right   PRIOR LEVEL OF FUNCTION: Independent  PATIENT GOALS: to be able to do something better to take care of my legs; to learn about any new management technology; to keep the lymphedema from getting worse."   OBJECTIVE:  OBSERVATIONS / OTHER ASSESSMENTS: Moderate, Stage  II, Primary, Bilateral Lower Extremity Lymphedema, L>R (Lymphedema Praecox)  FOTO functional outcome score: Intake 03/14/23: 81%  Lymphedema Life Impact Scale (LLIS) Intake 03/14/23: 20.59% (The extent to which lymphedema -related problems impacted your life in the past week)    BLE COMPARATIVE LIMB VOLUMETRICS Intake Visit 1 03/21/23  LANDMARK RIGHT    R LEG (A-D) 3627.8 ml  R THIGH (E-G) 6402.0 ml  R FULL LIMB (A-G) 10029.8 ml  Limb Volume differential (LVD)  %  Volume change since initial %  Volume change overall V  (Blank rows = not tested)  LANDMARK LEFT    L LEG (A-D) 4875.0 ml  L THIGH (E-G) 6129.6 ml  L  FULL LIMB (A-G) 11004.6 ml  Limb Volume differential (LVD)  Leg LVD = 25.6%, L>R Thigh LVD = 4.41%, R (dominant)>L Full Limb LVD = 8.85%, L>R%  Volume change since initial %  Volume change overall %  (Blank rows = not tested)    TODAY'S TREATMENT:  MLD to LLE/LLQ  Multilayer, knee length, LLE compression wrap  PATIENT EDUCATION:  Continued Pt/ CG edu for lymphedema self care home program throughout session. Topics include outcome of comparative limb volumetrics- starting limb volume differentials (LVDs), technology and gradient techniques used for short stretch, multilayer compression wrapping, simple self-MLD, therapeutic lymphatic pumping exercises, skin/nail care, LE precautions,.  compression garment recommendations and specifications, wear and care schedule and compression garment donning / doffing w assistive devices. Discussed progress towards all OT goals since commencing CDT. All questions answered to the Pt's satisfaction. Good return.  Person educated: Patient Education method: Explanation, Demonstration, and Handouts Education comprehension: verbalized understanding, returned demonstration, and needs further education  LE SELF-CARE HOME PROGRAM: BLE Lymphatic Pumping There ex Skin care to limit infection risk Compression Intensive stage compression: multilayer short stretch wraps with gradient techniques. One limb at a time. Self-management Phase Compression: Custom, flat knit, BLE compression knee highs. Consider Elvarex classic for daytime and Jobst Relax for HOS Simple self-MLD LLE/LLQ MLD utilizing short neck sequence, deep, diaphragmatic breathing, stationary J strokes to functional inguinal LN at L groin, then dynamic J strokes from proximal to distal from thigh, knee, leg and foot, with 3 sweeps to terminus to complete session.   Custom-made gradient compression garments and HOS devices are medically necessary in this case because they are uniquely sized and shaped to fit the exact dimensions of the affected extremities with deformities, and to provide accurate and consistent gradient compression and containment, essential to optimally managing this patient's symptoms of chronic, progressive lymphedema. Multiple custom compression garments are needed for optimal hygiene to limit infection risk. Custom compression garments should be replaced q 3-6 months When worn consistently for optimal lipo-lymphedema self-management over time.  ASSESSMENT: CLINICAL IMPRESSION: Limb volume appears unchanged since last session. Tissue integrity and generalized fibrosis below the knee is unchanged to palpation. Pt reports compression wrapping between visits is going well.  Continued MLD to LLE/LLQ utilizing established sequence. Pt continues to demonstrate slow response to CDT. Cont as per POC.  OBJECTIVE IMPAIRMENTS: decreased knowledge of condition, decreased knowledge of use of DME, decreased mobility, increased edema, pain, and chronic lower extremity swelling and associated pain, lymphedema related skin changes ( decreased excursion, increased skin approximation) increased infection risk  ACTIVITY LIMITATIONS: sitting tolerance, standing tolerance, and functional ambulation. Basic and Instrumental ADLS ( fitting shoes, socks and lower body clothing, grooming nails, inspecting skin, performing skin care, driving, shopping, housework) leisure pursuits (walking for exercise), productive activities ( caring for others, work duties),   PARTICIPATION LIMITATIONS:  negative body image limits clothing selection and social participation  PERSONAL FACTORS: Time since onset of onset/ level of progression; exacerbating co morbidities: : HTN, CVI   REHAB POTENTIAL: Good  EVALUATION COMPLEXITY: Moderate   GOALS: Goals reviewed with patient? Yes  SHORT TERM GOALS: Target date: 4th OT Rx visit  Pt will demonstrate understanding of lymphedema precautions and prevention strategies with modified independence using a printed reference to identify at least 5 precautions and discussing how s/he may implement them into daily life to reduce risk of progression with extra time. Baseline:Max A Goal status: INITIAL  2.  Pt will be able to apply multilayer, thigh length, gradient, compression wraps to one leg at a time with modified assistance (extra time) to decrease limb volume, to limit infection risk, and to limit lymphedema progression.  Baseline: Dependent Goal status: GOAL MET  LONG TERM GOALS: Target date: 06/12/23  Given this patient's Intake score of 81/100% on the functional outcomes FOTO tool, patient will experience an increase in function  of 3 points to improve  basic and instrumental ADLs performance, including lymphedema self-care.  Baseline: 81% Goal status: INITIAL  2.  Given this patient's Intake score of 20.59 % on the Lymphedema Life Impact Scale (LLIS), patient will experience a reduction of at least 5 points in her perceived level of functional impairment resulting from lymphedema to improve functional performance and quality of life (QOL). Baseline: 20.59% Goal status: INITIAL  3.  During Intensive phase CDT Pt will achieve at least 85% compliance with all lymphedema self-care home program components, including daily skin care, compression wraps and /or garments, simple self MLD and lymphatic pumping therex to habituate LE self care protocol  into ADLs for optimal LE self-management over time. Baseline: Dependent Goal status: INITIAL  4.  Pt will achieve at least a 10% volume reduction in the BLE to return limb to typical size and shape, to limit infection risk and LE progression, to decrease pain, to improve function. Baseline: Dependent Goal status: INITIAL  5.  STG: Pt will obtain proper compression garments/devices and achieve modified independence (extra time + assistive devices) with donning/doffing to optimize limb volume reductions and limit LE  progression over time. Baseline:  Goal status: INITIAL  PLAN:  PT FREQUENCY: 2x/weekand PRN  PT DURATION: other: and PRN  PLANNED INTERVENTIONS: Complete Decongestive Therapy (CDT): Therapeutic lymphatic pumping exercises, skin care simultaneously w MLD to limit infection risk, Manual lymph drainage (MLD), During Intensive Phase- multilayer Compression bandaging; during self-management phase formulate appropriate compression garments and devices, complete anatomical measurements, fitting and assessment t of garments; Consider trial of Flexitouch ADVANCED sequential compression device as replacement for incorrect basic vaso pneumatic device. Patient/Family education, DME instructions,     PLAN FOR NEXT SESSION:  Cont Pt edu for simple self-MLD, wrapping, there ex Continue MLD to LLE/LLQ. Cont LLE knee length multilayer compression wrap   Loel Dubonnet, MS, OTR/L, CLT-LANA 04/24/23 9:16 AM

## 2023-04-29 ENCOUNTER — Encounter: Payer: Self-pay | Admitting: Occupational Therapy

## 2023-04-29 ENCOUNTER — Ambulatory Visit: Payer: 59 | Admitting: Occupational Therapy

## 2023-04-29 DIAGNOSIS — I890 Lymphedema, not elsewhere classified: Secondary | ICD-10-CM | POA: Diagnosis not present

## 2023-04-29 NOTE — Therapy (Signed)
OUTPATIENT OCCUPATIONAL THERAPY TREATMENT LOWER EXTREMITY LYMPHEDEMA  Patient Name: Kelsey Terry MRN: [id number] DOB:1961-03-16, 62 y.o., female Today's Date: 04/24/23  END OF SESSION:   OT End of Session - 04/29/23 0911     Visit Number 8    Number of Visits 36    Date for OT Re-Evaluation 06/12/23    OT Start Time 0905    OT Stop Time 1005    OT Time Calculation (min) 60 min    Activity Tolerance Patient tolerated treatment well;No increased pain    Behavior During Therapy [facility] for tasks assessed/performed               Past Medical History:  Diagnosis Date   Allergies    Asthma    Blood dyscrasia    blood clot in right leg   CAD (coronary artery disease) 05/05/2019   CAD in native artery    a. NSTEMI 05/16/18: LHC 05/16/18 - ost-pLAD 95% s/p PCI/DES, mLAD 30%, ostD1 70%, ost-pLCx 30%, mRCA 90%, dRCA 20%; b. staged PCI/DES to [facility] 7/1   Complication of anesthesia    patient stated she had "some kind of reaction to anesthesia, "thought she was having a stroke" during her second cath procedure.   Diabetes mellitus without complication (HCC)    pre-diabetes   Diastolic dysfunction    a. TTE 05/16/18: EF 55-60%, no RWMA, Gr1DD, trivial AI, calcified mitral annulus, mild MR, mildly dilated LA   Dyspnea    with activity   Hypertension    Lymphedema    LLE   Pericarditis    a. ~ 2012 in [place], [place] s/p pericardiocentesis    Wears contact lenses    Past Surgical History:  Procedure Laterality Date   ABDOMINAL HYSTERECTOMY     CESAREAN SECTION     COLONOSCOPY WITH PROPOFOL N/A 08/13/2019   Procedure: COLONOSCOPY WITH BIOPSIES;  Surgeon: Midge Minium, MD;  Location: [facility];  Service: Endoscopy;  Laterality: N/A;   CORONARY STENT INTERVENTION N/A 05/16/2018   Procedure: CORONARY STENT INTERVENTION;  Surgeon: Iran Ouch, MD;  Location: [facility] INVASIVE CV [facility];  Service: Cardiovascular;  Laterality: N/A;   CORONARY STENT INTERVENTION N/A 05/19/2018    Procedure: CORONARY STENT INTERVENTION;  Surgeon: Iran Ouch, MD;  Location: [facility] INVASIVE CV LAB;  Service: Cardiovascular;  Laterality: N/A;   LEFT HEART CATH AND CORONARY ANGIOGRAPHY N/A 05/16/2018   Procedure: LEFT HEART CATH AND CORONARY ANGIOGRAPHY;  Surgeon: Iran Ouch, MD;  Location: [facility] INVASIVE CV LAB;  Service: Cardiovascular;  Laterality: N/A;   PERICARDIOCENTESIS     POLYPECTOMY N/A 08/13/2019   Procedure: POLYPECTOMY;  Surgeon: Midge Minium, MD;  Location: [facility];  Service: Endoscopy;  Laterality: N/A;   Patient Active Problem List   Diagnosis Date Noted   Cough 03/31/2022   Hyperlipidemia 03/06/2021   Familial hypercholesterolemia 08/23/2020   Acute bilateral knee pain 01/13/2020   Health care maintenance 11/24/2019   H/O elevated lipids 11/04/2019   Lymphedema 08/26/2019   Chronic venous insufficiency 08/26/2019   Encounter for screening colonoscopy    Polyp of sigmoid colon    Polyp of ascending colon    Lymphedema of left leg 06/16/2019   Prediabetes 06/16/2019   Abnormal laboratory test result 06/16/2019   Prediabetes 05/20/2019   Essential hypertension 05/19/2019   Abscess of left buttock 05/05/2019   Essential hypertension 05/05/2019   CAD (coronary artery disease) 05/05/2019   NSTEMI (non-ST elevated myocardial infarction) (HCC) 05/16/2018    PCP:  Duanne Limerick, MD  REFERRING PROVIDER: Levora Dredge, MD  REFERRING DIAG: I89.0  THERAPY DIAG:  Lymphedema, not elsewhere classified  Rationale for Evaluation and Treatment: Rehabilitation  ONSET DATE: 03/14/23  SUBJECTIVE:                                                                                                                                                                                           SUBJECTIVE STATEMENT: Ms Presby presents for OT visit to address BLE lymphedema, L>R. Pt denies LE-related leg pain. Pt states she feels like Rx is going well. Pt tolerated toe  wraps pretty well but , "after a while I had to take them off. It went down some".  PERTINENT HISTORY: LE, CVI, HTN, Prediabetes, CAD, s/p NSTEMI 04/2028  PAIN: Are you having lymphedema -related  pain? No  PRECAUTIONS: Other: LYMPHEDEMA PRECAUTIONS: CARDIAC, DM- skin  FALLS: Has patient fallen since last visit? No  HAND DOMINANCE: right   PRIOR LEVEL OF FUNCTION: Independent  PATIENT GOALS: to be able to do something better to take care of my legs; to learn about any new management technology; to keep the lymphedema from getting worse."   OBJECTIVE:  OBSERVATIONS / OTHER ASSESSMENTS: Moderate, Stage  II, Primary, Bilateral Lower Extremity Lymphedema, L>R (Lymphedema [place]ox)  FOTO functional outcome score: Intake 03/14/23: 81%  Lymphedema Life Impact Scale (LLIS) Intake 03/14/23: 20.59% (The extent to which lymphedema -related problems impacted your life in the past week)    BLE COMPARATIVE LIMB VOLUMETRICS Intake Visit 1 03/21/23  LANDMARK RIGHT    R LEG (A-D) 3627.8 ml  R THIGH (E-G) 6402.0 ml  R FULL LIMB (A-G) 10029.8 ml  Limb Volume differential (LVD)  %  Volume change since initial %  Volume change overall V  (Blank rows = not tested)  LANDMARK LEFT    L LEG (A-D) 4875.0 ml  L THIGH (E-G) 6129.6 ml  L  FULL LIMB (A-G) 11004.6 ml  Limb Volume differential (LVD)  Leg LVD = 25.6%, L>R Thigh LVD = 4.41%, R (dominant)>L Full Limb LVD = 8.85%, L>R%  Volume change since initial %  Volume change overall %  (Blank rows = not tested)    TODAY'S TREATMENT:  MLD to LLE/LLQ  Multilayer, knee length, LLE compression wrap  PATIENT EDUCATION:  Continued Pt/ CG edu for lymphedema self care home program throughout session. Topics include outcome of comparative limb volumetrics- starting limb volume differentials (LVDs), technology and gradient techniques  used for short stretch, multilayer compression wrapping, simple self-MLD, therapeutic lymphatic pumping exercises, skin/nail care, LE precautions,. compression garment recommendations and specifications, wear and care schedule and compression garment donning / doffing w assistive devices. Discussed progress towards all OT goals since commencing CDT. All questions answered to the Pt's satisfaction. Good return.  Person educated: Patient Education method: Explanation, Demonstration, and Handouts Education comprehension: verbalized understanding, returned demonstration, and needs further education  LE SELF-CARE HOME PROGRAM: BLE Lymphatic Pumping There ex Skin care to limit infection risk Compression Intensive stage compression: multilayer short stretch wraps with gradient techniques. One limb at a time. Self-management Phase Compression: Custom, flat knit, BLE compression knee highs. Consider Elvarex classic for daytime and Jobst Relax for HOS Simple self-MLD LLE/LLQ MLD utilizing short neck sequence, deep, diaphragmatic breathing, stationary J strokes to functional inguinal LN at L groin, then dynamic J strokes from proximal to distal from thigh, knee, leg and foot, with 3 sweeps to terminus to complete session.   Custom-made gradient compression garments and HOS devices are medically necessary in this case because they are uniquely sized and shaped to fit the exact dimensions of the affected extremities with deformities, and to provide accurate and consistent gradient compression and containment, essential to optimally managing this patient's symptoms of chronic, progressive lymphedema. Multiple custom compression garments are needed for optimal hygiene to limit infection risk. Custom compression garments should be replaced q 3-6 months When worn consistently for optimal lipo-lymphedema self-management over time.  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 Tissue integrity remains fibrotic with varicose veins easily palpable. Slight additional reduction in tissue density in dorsal foot palpable at end of session. Reapplied compression wraps as established.Cont as per POC.  OBJECTIVE IMPAIRMENTS: decreased knowledge of condition, decreased knowledge of use of DME, decreased mobility, increased edema, pain, and chronic lower extremity swelling and associated pain, lymphedema related skin changes ( decreased excursion, increased skin approximation) increased infection risk  ACTIVITY LIMITATIONS: sitting tolerance, standing tolerance, and functional ambulation. Basic and Instrumental ADLS ( fitting shoes, socks and lower body clothing, grooming nails, inspecting skin, performing skin care, driving, shopping, housework) leisure pursuits (walking for exercise), productive activities ( caring for others, work duties),   PARTICIPATION LIMITATIONS:  negative body image limits clothing selection and social participation  PERSONAL FACTORS: Time since onset of onset/ level of progression; exacerbating co morbidities: : HTN, CVI   REHAB POTENTIAL: Good  EVALUATION COMPLEXITY: Moderate   GOALS: Goals reviewed with patient? Yes  SHORT TERM GOALS: Target date: 4th OT Rx visit  Pt will demonstrate understanding of lymphedema precautions and prevention strategies with modified independence using a printed reference to identify at least 5 precautions and discussing how s/he may implement them into daily life to reduce risk of progression with extra time. Baseline:Max A Goal status: INITIAL  2.  Pt will be able to apply multilayer, thigh length, gradient, compression wraps to one leg at a time with modified assistance (extra time) to decrease limb volume, to limit infection risk, and to limit lymphedema progression.  Baseline: Dependent Goal status: GOAL  MET  LONG TERM GOALS: Target date: 06/12/23  Given this patient's Intake score of 81/100% on the  functional outcomes FOTO tool, patient will experience an increase in function of 3 points to improve basic and instrumental ADLs performance, including lymphedema self-care.  Baseline: 81% Goal status: INITIAL  2.  Given this patient's Intake score of 20.59 % on the Lymphedema Life Impact Scale (LLIS), patient will experience a reduction of at least 5 points in her perceived level of functional impairment resulting from lymphedema to improve functional performance and quality of life (QOL). Baseline: 20.59% Goal status: INITIAL  3.  During Intensive phase CDT Pt will achieve at least 85% compliance with all lymphedema self-care home program components, including daily skin care, compression wraps and /or garments, simple self MLD and lymphatic pumping therex to habituate LE self care protocol  into ADLs for optimal LE self-management over time. Baseline: Dependent Goal status: INITIAL  4.  Pt will achieve at least a 10% volume reduction in the BLE to return limb to typical size and shape, to limit infection risk and LE progression, to decrease pain, to improve function. Baseline: Dependent Goal status: INITIAL  5.  STG: Pt will obtain proper compression garments/devices and achieve modified independence (extra time + assistive devices) with donning/doffing to optimize limb volume reductions and limit LE  progression over time. Baseline:  Goal status: INITIAL  PLAN:  PT FREQUENCY: 2x/weekand PRN  PT DURATION: other: and PRN  PLANNED INTERVENTIONS: Complete Decongestive Therapy (CDT): Therapeutic lymphatic pumping exercises, skin care simultaneously w MLD to limit infection risk, Manual lymph drainage (MLD), During Intensive Phase- multilayer Compression bandaging; during self-management phase formulate appropriate compression garments and devices, complete anatomical measurements, fitting and  assessment t of garments; Consider trial of Flexitouch ADVANCED sequential compression device as replacement for incorrect basic vaso pneumatic device. Patient/Family education, DME instructions,    PLAN FOR NEXT SESSION:  Cont Pt edu for simple self-MLD, wrapping, there ex Continue MLD to LLE/LLQ. Cont LLE knee length multilayer compression wrap   Loel Dubonnet, MS, OTR/L, CLT-LANA 04/29/23 11:16 AM

## 2023-05-01 ENCOUNTER — Encounter: Payer: 59 | Admitting: Occupational Therapy

## 2023-05-02 ENCOUNTER — Encounter: Payer: 59 | Admitting: Occupational Therapy

## 2023-05-03 ENCOUNTER — Encounter: Payer: Self-pay | Admitting: Occupational Therapy

## 2023-05-03 ENCOUNTER — Ambulatory Visit: Payer: 59 | Admitting: Occupational Therapy

## 2023-05-03 DIAGNOSIS — I890 Lymphedema, not elsewhere classified: Secondary | ICD-10-CM | POA: Diagnosis not present

## 2023-05-03 NOTE — Therapy (Signed)
OUTPATIENT OCCUPATIONAL THERAPY TREATMENT LOWER EXTREMITY LYMPHEDEMA  Patient Name: Kelsey Terry MRN: [id number] DOB:1961-07-09, 62 y.o., female Today's Date: 04/24/23  END OF SESSION:   OT End of Session - 05/03/23 0909     Visit Number 9    Number of Visits 36    Date for OT Re-Evaluation 06/12/23    OT Start Time 0900    Activity Tolerance Patient tolerated treatment well;No increased pain    Behavior During Therapy [facility] for tasks assessed/performed               Past Medical History:  Diagnosis Date   Allergies    Asthma    Blood dyscrasia    blood clot in right leg   CAD (coronary artery disease) 05/05/2019   CAD in native artery    a. NSTEMI 05/16/18: LHC 05/16/18 - ost-pLAD 95% s/p PCI/DES, mLAD 30%, ostD1 70%, ost-pLCx 30%, mRCA 90%, dRCA 20%; b. staged PCI/DES to [facility] 7/1   Complication of anesthesia    patient stated she had "some kind of reaction to anesthesia, "thought she was having a stroke" during her second cath procedure.   Diabetes mellitus without complication (HCC)    pre-diabetes   Diastolic dysfunction    a. TTE 05/16/18: EF 55-60%, no RWMA, Gr1DD, trivial AI, calcified mitral annulus, mild MR, mildly dilated LA   Dyspnea    with activity   Hypertension    Lymphedema    LLE   Pericarditis    a. ~ 2012 in [place], [place] s/p pericardiocentesis    Wears contact lenses    Past Surgical History:  Procedure Laterality Date   ABDOMINAL HYSTERECTOMY     CESAREAN SECTION     COLONOSCOPY WITH PROPOFOL N/A 08/13/2019   Procedure: COLONOSCOPY WITH BIOPSIES;  Surgeon: Midge Minium, MD;  Location: [facility];  Service: Endoscopy;  Laterality: N/A;   CORONARY STENT INTERVENTION N/A 05/16/2018   Procedure: CORONARY STENT INTERVENTION;  Surgeon: Iran Ouch, MD;  Location: [facility];  Service: Cardiovascular;  Laterality: N/A;   CORONARY STENT INTERVENTION N/A 05/19/2018   Procedure: CORONARY STENT INTERVENTION;  Surgeon: Iran Ouch, MD;  Location: [facility];  Service: Cardiovascular;  Laterality: N/A;   LEFT HEART CATH AND CORONARY ANGIOGRAPHY N/A 05/16/2018   Procedure: LEFT HEART CATH AND CORONARY ANGIOGRAPHY;  Surgeon: Iran Ouch, MD;  Location: [facility];  Service: Cardiovascular;  Laterality: N/A;   PERICARDIOCENTESIS     POLYPECTOMY N/A 08/13/2019   Procedure: POLYPECTOMY;  Surgeon: Midge Minium, MD;  Location: [facility];  Service: Endoscopy;  Laterality: N/A;   Patient Active Problem List   Diagnosis Date Noted   Cough 03/31/2022   Hyperlipidemia 03/06/2021   Familial hypercholesterolemia 08/23/2020   Acute bilateral knee pain 01/13/2020   Health care maintenance 11/24/2019   H/O elevated lipids 11/04/2019   Lymphedema 08/26/2019   Chronic venous insufficiency 08/26/2019   Encounter for screening colonoscopy    Polyp of sigmoid colon    Polyp of ascending colon    Lymphedema of left leg 06/16/2019   Prediabetes 06/16/2019   Abnormal laboratory test result 06/16/2019   Prediabetes 05/20/2019   Essential hypertension 05/19/2019   Abscess of left buttock 05/05/2019   Essential hypertension 05/05/2019   CAD (coronary artery disease) 05/05/2019   NSTEMI (non-ST elevated myocardial infarction) (HCC) 05/16/2018    PCP: Duanne Limerick, MD  REFERRING PROVIDER: Levora Dredge, MD  REFERRING DIAG: I89.0  THERAPY  DIAG:  Lymphedema, not elsewhere classified  Rationale for Evaluation and Treatment: Rehabilitation  ONSET DATE: 03/14/23  SUBJECTIVE:                                                                                                                                                                                           SUBJECTIVE STATEMENT: Kelsey Terry presents for OT visit to address BLE lymphedema, L>R. Pt denies LE-related leg pain. Pt states she feels like Rx is going well. Pt has no new complaints  PERTINENT HISTORY: LE, CVI, HTN, Prediabetes, CAD, s/p  NSTEMI 04/2028  PAIN: Are you having lymphedema -related  pain? No  PRECAUTIONS: Other: LYMPHEDEMA PRECAUTIONS: CARDIAC, DM- skin  FALLS: Has patient fallen since last visit? No  HAND DOMINANCE: right   PRIOR LEVEL OF FUNCTION: Independent  PATIENT GOALS: to be able to do something better to take care of my legs; to learn about any new management technology; to keep the lymphedema from getting worse."   OBJECTIVE:  OBSERVATIONS / OTHER ASSESSMENTS: Moderate, Stage  II, Primary, Bilateral Lower Extremity Lymphedema, L>R (Lymphedema Praecox)  FOTO functional outcome score: Intake 03/14/23: 81%  Lymphedema Life Impact Scale (LLIS) Intake 03/14/23: 20.59% (The extent to which lymphedema -related problems impacted your life in the past week)    BLE COMPARATIVE LIMB VOLUMETRICS Intake Visit 1 03/21/23  LANDMARK RIGHT    R LEG (A-D) 3627.8 ml  R THIGH (E-G) 6402.0 ml  R FULL LIMB (A-G) 10029.8 ml  Limb Volume differential (LVD)  %  Volume change since initial %  Volume change overall V  (Blank rows = not tested)  LANDMARK LEFT    L LEG (A-D) 4875.0 ml  L THIGH (E-G) 6129.6 ml  L  FULL LIMB (A-G) 11004.6 ml  Limb Volume differential (LVD)  Leg LVD = 25.6%, L>R Thigh LVD = 4.41%, R (dominant)>L Full Limb LVD = 8.85%, L>R%  Volume change since initial %  Volume change overall %  (Blank rows = not tested)   LLE COMPARATIVE LIMB VOLUMETRICS 9 th Rx Visit  05/03/23  LANDMARK LEFT    L LEG (A-D) 4609.5 ml  L THIGH (E-G) 6182.3 ml  L  FULL LIMB (A-G) 10791.9 ml  Limb Volume differential (LVD)  L LEG reduction = 5.45% since commencing CDT on 03/21/23.  L THIGH is essentially unchanged with INCREASED volume measuring 0.86%. Full LLE volume reduction measures 1.93% to date.  Volume change since initial %  Volume change overall %  (Blank rows = not tested)   TODAY'S TREATMENT:  LLE comparaTIVE LIMB VOLUMETRICS Multilayer, knee length, LLE compression wrap  PATIENT EDUCATION:  Reviewed progress towards goals to date Continued Pt/ CG edu for lymphedema self care home program throughout session. Topics include outcome of comparative limb volumetrics- starting limb volume differentials (LVDs), technology and gradient techniques used for short stretch, multilayer compression wrapping, simple self-MLD, therapeutic lymphatic pumping exercises, skin/nail care, LE precautions,. compression garment recommendations and specifications, wear and care schedule and compression garment donning / doffing w assistive devices. Discussed progress towards all OT goals since commencing CDT. All questions answered to the Pt's satisfaction. Good return.  Person educated: Patient Education method: Explanation, Demonstration, and Handouts Education comprehension: verbalized understanding, returned demonstration, and needs further education  LE SELF-CARE HOME PROGRAM: BLE Lymphatic Pumping There ex Skin care to limit infection risk Compression Intensive stage compression: multilayer short stretch wraps with gradient techniques. One limb at a time. Self-management Phase Compression: Custom, flat knit, BLE compression knee highs. Consider Elvarex classic for daytime and Jobst Relax for HOS Simple self-MLD LLE/LLQ MLD utilizing short neck sequence, deep, diaphragmatic breathing, stationary J strokes to functional inguinal LN at L groin, then dynamic J strokes from proximal to distal from thigh, knee, leg and foot, with 3 sweeps to terminus to complete session.   Custom-made gradient compression garments and HOS devices are medically necessary in this case because they are uniquely sized and shaped to fit the exact dimensions of the affected extremities with deformities, and to provide accurate and consistent gradient compression and containment, essential to optimally managing this  patient's symptoms of chronic, progressive lymphedema. Multiple custom compression garments are needed for optimal hygiene to limit infection risk. Custom compression garments should be replaced q 3-6 months When worn consistently for optimal lipo-lymphedema self-management over time.  ASSESSMENT: CLINICAL IMPRESSION: LLE comparative limb volumetrics in prep for progress report next visit reveal L LEG reduction = 5.45% since commencing CDT on 03/21/23.  L THIGH is essentially unchanged with INCREASED volume measuring 0.86%. And Full LLE volume reduction measures 1.93% to date. These values are evidence of measurable progress towards vilume reduction goal. Pt is pleased with these results. Reapplied LLE knee length compression wraps without increased pain. Cont as per POC.  OBJECTIVE IMPAIRMENTS: decreased knowledge of condition, decreased knowledge of use of DME, decreased mobility, increased edema, pain, and chronic lower extremity swelling and associated pain, lymphedema related skin changes ( decreased excursion, increased skin approximation) increased infection risk  ACTIVITY LIMITATIONS: sitting tolerance, standing tolerance, and functional ambulation. Basic and Instrumental ADLS ( fitting shoes, socks and lower body clothing, grooming nails, inspecting skin, performing skin care, driving, shopping, housework) leisure pursuits (walking for exercise), productive activities ( caring for others, work duties),   PARTICIPATION LIMITATIONS:  negative body image limits clothing selection and social participation  PERSONAL FACTORS: Time since onset of onset/ level of progression; exacerbating co morbidities: : HTN, CVI   REHAB POTENTIAL: Good  EVALUATION COMPLEXITY: Moderate   GOALS: Goals reviewed with patient? Yes  SHORT TERM GOALS: Target date: 4th OT Rx visit  Pt will demonstrate understanding of lymphedema precautions and prevention strategies with modified independence using a printed  reference to identify at least 5 precautions and discussing how s/he may implement them into daily life to reduce risk of progression with extra time. Baseline:Max A Goal status: 05/03/23 PROGRESSING  2.  Pt will be able to apply multilayer, thigh length, gradient, compression wraps to one leg at a time with modified assistance (extra time) to decrease limb volume, to limit infection risk, and to limit lymphedema progression.  Baseline: Dependent  Goal status: GOAL MET  LONG TERM GOALS: Target date: 06/12/23  Given this patient's Intake score of 81/100% on the functional outcomes FOTO tool, patient will experience an increase in function of 3 points to improve basic and instrumental ADLs performance, including lymphedema self-care.  Baseline: 81% Goal status: 05/03/23 PROGRESSING  2.  Given this patient's Intake score of 20.59 % on the Lymphedema Life Impact Scale (LLIS), patient will experience a reduction of at least 5 points in her perceived level of functional impairment resulting from lymphedema to improve functional performance and quality of life (QOL). Baseline: 20.59% Goal status: 05/03/23 PROGRESSING  3.  During Intensive phase CDT Pt will achieve at least 85% compliance with all lymphedema self-care home program components, including daily skin care, compression wraps and /or garments, simple self MLD and lymphatic pumping therex to habituate LE self care protocol  into ADLs for optimal LE self-management over time. Baseline: Dependent Goal status: 05/03/23 PROGRESSING  4.  Pt will achieve at least a 10% volume reduction in the BLE to return limb to typical size and shape, to limit infection risk and LE progression, to decrease pain, to improve function. Baseline: Dependent Goal status: 05/03/23 PROGRESSING: L LEG reduction = 5.45% since commencing CDT on 03/21/23.  L THIGH is essentially unchanged with INCREASED volume measuring 0.86%. Full LLE volume reduction measures 1.93% to  date.  5.  STG: Pt will obtain proper compression garments/devices and achieve modified independence (extra time + assistive devices) with donning/doffing to optimize limb volume reductions and limit LE  progression over time. Baseline:  Goal status: INITIAL  PLAN:  PT FREQUENCY: 2x/weekand PRN  PT DURATION: other: and PRN  PLANNED INTERVENTIONS: Complete Decongestive Therapy (CDT): Therapeutic lymphatic pumping exercises, skin care simultaneously w MLD to limit infection risk, Manual lymph drainage (MLD), During Intensive Phase- multilayer Compression bandaging; during self-management phase formulate appropriate compression garments and devices, complete anatomical measurements, fitting and assessment t of garments; Consider trial of Flexitouch ADVANCED sequential compression device as replacement for incorrect basic vaso pneumatic device. Patient/Family education, DME instructions,    PLAN FOR NEXT SESSION:  Cont Pt edu for simple self-MLD, wrapping, there ex Continue MLD to LLE/LLQ. Cont LLE knee length multilayer compression wrap   Loel Dubonnet, Kelsey, OTR/L, CLT-LANA 05/03/23 9:10 AM

## 2023-05-06 ENCOUNTER — Telehealth: Payer: Self-pay | Admitting: Family Medicine

## 2023-05-06 ENCOUNTER — Other Ambulatory Visit: Payer: Self-pay | Admitting: Obstetrics and Gynecology

## 2023-05-06 DIAGNOSIS — Z1231 Encounter for screening mammogram for malignant neoplasm of breast: Secondary | ICD-10-CM

## 2023-05-06 NOTE — Telephone Encounter (Signed)
Copied from CRM [id number]. Topic: General - Other >> May 06, 2023  2:27 PM Ja-Kwan M wrote: Reason for CRM: Pt wants to make Dr.Jones aware that her mail order pharmacy will be sending a request to refill her medications.

## 2023-05-07 ENCOUNTER — Ambulatory Visit: Payer: 59 | Admitting: Occupational Therapy

## 2023-05-07 DIAGNOSIS — I890 Lymphedema, not elsewhere classified: Secondary | ICD-10-CM

## 2023-05-07 NOTE — Therapy (Signed)
OUTPATIENT OCCUPATIONAL THERAPY TREATMENT NOTE AND PROGRESS REPORT LOWER EXTREMITY LYMPHEDEMA  Patient Name: Kelsey Terry MRN: [id number] DOB:1961-02-13, 62 y.o., female Today's Date: 04/24/23  REPORTING PERIOD: 03/14/23 - 05/07/23  END OF SESSION:   OT End of Session - 05/07/23 0919     Visit Number 10    Number of Visits 36    Date for OT Re-Evaluation 06/12/23    OT Start Time 0910    OT Stop Time 1010    OT Time Calculation (min) 60 min    Activity Tolerance Patient tolerated treatment well;No increased pain    Behavior During Therapy [facility] for tasks assessed/performed               Past Medical History:  Diagnosis Date   Allergies    Asthma    Blood dyscrasia    blood clot in right leg   CAD (coronary artery disease) 05/05/2019   CAD in native artery    a. NSTEMI 05/16/18: LHC 05/16/18 - ost-pLAD 95% s/p PCI/DES, mLAD 30%, ostD1 70%, ost-pLCx 30%, mRCA 90%, dRCA 20%; b. staged PCI/DES to [facility] 7/1   Complication of anesthesia    patient stated she had "some kind of reaction to anesthesia, "thought she was having a stroke" during her second cath procedure.   Diabetes mellitus without complication (HCC)    pre-diabetes   Diastolic dysfunction    a. TTE 05/16/18: EF 55-60%, no RWMA, Gr1DD, trivial AI, calcified mitral annulus, mild MR, mildly dilated LA   Dyspnea    with activity   Hypertension    Lymphedema    LLE   Pericarditis    a. ~ 2012 in [place], [place] s/p pericardiocentesis    Wears contact lenses    Past Surgical History:  Procedure Laterality Date   ABDOMINAL HYSTERECTOMY     CESAREAN SECTION     COLONOSCOPY WITH PROPOFOL N/A 08/13/2019   Procedure: COLONOSCOPY WITH BIOPSIES;  Surgeon: Midge Minium, MD;  Location: [facility];  Service: Endoscopy;  Laterality: N/A;   CORONARY STENT INTERVENTION N/A 05/16/2018   Procedure: CORONARY STENT INTERVENTION;  Surgeon: Iran Ouch, MD;  Location: [facility] INVAS[facility] CV [facility];  Service: Cardiovascular;   Laterality: N/A;   CORONARY STENT INTERVENTION N/A 05/19/2018   Procedure: CORONARY STENT INTERVENTION;  Surgeon: Iran Ouch, MD;  Location: [facility] INVASIVE CV LAB;  Service: Cardiovascular;  Laterality: N/A;   LEFT HEART CATH AND CORONARY ANGIOGRAPHY N/A 05/16/2018   Procedure: LEFT HEART CATH AND CORONARY ANGIOGRAPHY;  Surgeon: Iran Ouch, MD;  Location: [facility] INVASIVE CV LAB;  Service: Cardiovascular;  Laterality: N/A;   PERICARDIOCENTESIS     POLYPECTOMY N/A 08/13/2019   Procedure: POLYPECTOMY;  Surgeon: Midge Minium, MD;  Location: [facility];  Service: Endoscopy;  Laterality: N/A;   Patient Active Problem List   Diagnosis Date Noted   Cough 03/31/2022   Hyperlipidemia 03/06/2021   Familial hypercholesterolemia 08/23/2020   Acute bilateral knee pain 01/13/2020   Health care maintenance 11/24/2019   H/O elevated lipids 11/04/2019   Lymphedema 08/26/2019   Chronic venous insufficiency 08/26/2019   Encounter for screening colonoscopy    Polyp of sigmoid colon    Polyp of ascending colon    Lymphedema of left leg 06/16/2019   Prediabetes 06/16/2019   Abnormal laboratory test result 06/16/2019   Prediabetes 05/20/2019   Essential hypertension 05/19/2019   Abscess of left buttock 05/05/2019   Essential hypertension 05/05/2019   CAD (coronary artery disease) 05/05/2019   NSTEMI (  non-ST elevated myocardial infarction) (HCC) 05/16/2018    PCP: Duanne Limerick, MD  REFERRING PROVIDER: Levora Dredge, MD  REFERRING DIAG: I89.0  THERAPY DIAG:  Lymphedema, not elsewhere classified  Rationale for Evaluation and Treatment: Rehabilitation  ONSET DATE: 03/14/23  SUBJECTIVE:                                                                                                                                                                                           SUBJECTIVE STATEMENT: Ms Bawden presents for OT visit to address BLE lymphedema, L>R. Pt denies LE-related leg  pain. Pt states she feels like Rx is going well. Pt has no new complaints  PERTINENT HISTORY: LE, CVI, HTN, Prediabetes, CAD, s/p NSTEMI 04/2028  PAIN: Are you having lymphedema -related  pain? No  PRECAUTIONS: Other: LYMPHEDEMA PRECAUTIONS: CARDIAC, DM- skin  FALLS: Has patient fallen since last visit? No  HAND DOMINANCE: right   PRIOR LEVEL OF FUNCTION: Independent  PATIENT GOALS: to be able to do something better to take care of my legs; to learn about any new management technology; to keep the lymphedema from getting worse."   OBJECTIVE:  OBSERVATIONS / OTHER ASSESSMENTS: Moderate, Stage  II, Primary, Bilateral Lower Extremity Lymphedema, L>R (Lymphedema [place]ox)  FOTO functional outcome score: Intake 03/14/23: 81%  Lymphedema Life Impact Scale (LLIS) Intake 03/14/23: 20.59% (The extent to which lymphedema -related problems impacted your life in the past week)    BLE COMPARATIVE LIMB VOLUMETRICS Intake Visit 1 03/21/23  LANDMARK RIGHT    R LEG (A-D) 3627.8 ml  R THIGH (E-G) 6402.0 ml  R FULL LIMB (A-G) 10029.8 ml  Limb Volume differential (LVD)  %  Volume change since initial %  Volume change overall V  (Blank rows = not tested)  LANDMARK LEFT    L LEG (A-D) 4875.0 ml  L THIGH (E-G) 6129.6 ml  L  FULL LIMB (A-G) 11004.6 ml  Limb Volume differential (LVD)  Leg LVD = 25.6%, L>R Thigh LVD = 4.41%, R (dominant)>L Full Limb LVD = 8.85%, L>R%  Volume change since initial %  Volume change overall %  (Blank rows = not tested)   LLE COMPARATIVE LIMB VOLUMETRICS 9 th Rx Visit  05/03/23  LANDMARK LEFT    L LEG (A-D) 4609.5 ml  L THIGH (E-G) 6182.3 ml  L  FULL LIMB (A-G) 10791.9 ml  Limb Volume differential (LVD)  L LEG reduction = 5.45% since commencing CDT on 03/21/23.  L THIGH is essentially unchanged with INCREASED volume measuring 0.86%. Full LLE volume reduction measures 1.93% to date.  Volume change since initial %  Volume change overall %  (  Blank rows = not  tested)   TODAY'S TREATMENT:  LLE MLD Multilayer, knee length, LLE compression wrap                                                                                                                              PATIENT EDUCATION:  3. Reviewed progress towards goals to date Continued Pt/ CG edu for lymphedema self care home program throughout session. Topics include outcome of comparative limb volumetrics- starting limb volume differentials (LVDs), technology and gradient techniques used for short stretch, multilayer compression wrapping, simple self-MLD, therapeutic lymphatic pumping exercises, skin/nail care, LE precautions,. compression garment recommendations and specifications, wear and care schedule and compression garment donning / doffing w assistive devices. Discussed progress towards all OT goals since commencing CDT. All questions answered to the Pt's satisfaction. Good return.  Person educated: Patient Education method: Explanation, Demonstration, and Handouts Education comprehension: verbalized understanding, returned demonstration, and needs further education  LE SELF-CARE HOME PROGRAM: BLE Lymphatic Pumping There ex Skin care to limit infection risk Compression Intensive stage compression: multilayer short stretch wraps with gradient techniques. One limb at a time. Self-management Phase Compression: Custom, flat knit, BLE compression knee highs. Consider Elvarex classic for daytime and Jobst Relax for HOS Simple self-MLD LLE/LLQ MLD utilizing short neck sequence, deep, diaphragmatic breathing, stationary J strokes to functional inguinal LN at L groin, then dynamic J strokes from proximal to distal from thigh, knee, leg and foot, with 3 sweeps to terminus to complete session.   Custom-made gradient compression garments and HOS devices are medically necessary in this case because they are uniquely sized and shaped to fit the exact dimensions of the affected extremities with  deformities, and to provide accurate and consistent gradient compression and containment, essential to optimally managing this patient's symptoms of chronic, progressive lymphedema. Multiple custom compression garments are needed for optimal hygiene to limit infection risk. Custom compression garments should be replaced q 3-6 months When worn consistently for optimal lipo-lymphedema self-management over time.  ASSESSMENT: CLINICAL IMPRESSION:   In regard to volumetric reduction goal, L LEG reduction too date measures 5.45% since commencing CDT on 03/21/23. L THIGH is essentially unchanged with INCREASED volume measuring 0.86%. Full LLE volume reduction measures 1.93% to date. These values are evidence of progress towards volume reduction goal, but 10% reduction goal is not yet met for LLE. Lymphostatic fibrosis remains dense and non responsive to deep fibrosis techniques with MLD.  Please refer to GOALS sections for additional details Intensive and Self-Management Phase goals.  Pt tolerated MLD without increased pain today. Reapplied compression wraps at end of session. Cont as per POC.  OBJECTIVE IMPAIRMENTS: decreased knowledge of condition, decreased knowledge of use of DME, decreased mobility, increased edema, pain, and chronic lower extremity swelling and associated pain, lymphedema related skin changes ( decreased excursion, increased skin approximation) increased infection risk  ACTIVITY LIMITATIONS: sitting tolerance, standing tolerance, and functional ambulation. Basic and Instrumental ADLS ( fitting shoes,  socks and lower body clothing, grooming nails, inspecting skin, performing skin care, driving, shopping, housework) leisure pursuits (walking for exercise), productive activities ( caring for others, work duties),   PARTICIPATION LIMITATIONS:  negative body image limits clothing selection and social participation  PERSONAL FACTORS: Time since onset of onset/ level of progression; exacerbating co  morbidities: : HTN, CVI   REHAB POTENTIAL: Good  EVALUATION COMPLEXITY: Moderate   GOALS: Goals reviewed with patient? Yes  SHORT TERM GOALS: Target date: 4th OT Rx visit  Pt will demonstrate understanding of lymphedema precautions and prevention strategies with modified independence using a printed reference to identify at least 5 precautions and discussing how s/he may implement them into daily life to reduce risk of progression with extra time. Baseline:Max A Goal status: 05/07/23 GOAL MET  2.  Pt will be able to apply multilayer, thigh length, gradient, compression wraps to one leg at a time with modified assistance (extra time) to decrease limb volume, to limit infection risk, and to limit lymphedema progression.  Baseline: Dependent Goal status: 05/07/23 GOAL MET  LONG TERM GOALS: Target date: 06/12/23  Given this patient's Intake score of 81/100% on the functional outcomes FOTO tool, patient will experience an increase in function of 3 points to improve basic and instrumental ADLs performance, including lymphedema self-care.  Baseline: 81% Goal status: 05/07/23 PROGRESSING  2.  Given this patient's Intake score of 20.59 % on the Lymphedema Life Impact Scale (LLIS), patient will experience a reduction of at least 5 points in her perceived level of functional impairment resulting from lymphedema to improve functional performance and quality of life (QOL). Baseline: 20.59% Goal status: 05/07/23 GOAL MET  3.  During Intensive phase CDT Pt will achieve at least 85% compliance with all lymphedema self-care home program components, including daily skin care, compression wraps and /or garments, simple self MLD and lymphatic pumping therex to habituate LE self care protocol  into ADLs for optimal LE self-management over time. Baseline: Dependent Goal status:05/07/23 GOAL MET  4.  Pt will achieve at least a 10% volume reduction in the BLE to return limb to typical size and shape, to limit  infection risk and LE progression, to decrease pain, to improve function. Baseline: Dependent Goal status: 05/07/23 PROGRESSING: L LEG reduction = 5.45% since commencing CDT on 03/21/23.  L THIGH is essentially unchanged with INCREASED volume measuring 0.86%. Full LLE volume reduction measures 1.93% to date.  5.  STG: Pt will obtain proper compression garments/devices and achieve modified independence (extra time + assistive devices) with donning/doffing to optimize limb volume reductions and limit LE  progression over time. Baseline: Max A Goal status: 05/07/23 PROGRESSING  PLAN:  PT FREQUENCY: 2x/weekand PRN  PT DURATION: other: and PRN  PLANNED INTERVENTIONS: Complete Decongestive Therapy (CDT): Therapeutic lymphatic pumping exercises, skin care simultaneously w MLD to limit infection risk, Manual lymph drainage (MLD), During Intensive Phase- multilayer Compression bandaging; during self-management phase formulate appropriate compression garments and devices, complete anatomical measurements, fitting and assessment t of garments; Consider trial of Flexitouch ADVANCED sequential compression device as replacement for incorrect basic vaso pneumatic device. Patient/Family education, DME instructions,    PLAN FOR NEXT SESSION:  Cont Pt edu for simple self-MLD, wrapping, there ex Continue MLD to LLE/LLQ. Cont LLE knee length multilayer compression wrap   Loel Dubonnet, MS, OTR/L, CLT-LANA 05/07/23 10:39 AM

## 2023-05-08 ENCOUNTER — Ambulatory Visit: Payer: 59 | Admitting: Family Medicine

## 2023-05-09 ENCOUNTER — Encounter: Payer: Self-pay | Admitting: Family Medicine

## 2023-05-09 ENCOUNTER — Ambulatory Visit (INDEPENDENT_AMBULATORY_CARE_PROVIDER_SITE_OTHER): Payer: 59 | Admitting: Family Medicine

## 2023-05-09 VITALS — BP 102/76 | HR 62 | Ht 63.0 in | Wt 167.0 lb

## 2023-05-09 DIAGNOSIS — I2510 Atherosclerotic heart disease of native coronary artery without angina pectoris: Secondary | ICD-10-CM | POA: Diagnosis not present

## 2023-05-09 DIAGNOSIS — E782 Mixed hyperlipidemia: Secondary | ICD-10-CM | POA: Diagnosis not present

## 2023-05-09 DIAGNOSIS — I10 Essential (primary) hypertension: Secondary | ICD-10-CM

## 2023-05-09 DIAGNOSIS — R7303 Prediabetes: Secondary | ICD-10-CM | POA: Diagnosis not present

## 2023-05-09 MED ORDER — POTASSIUM CHLORIDE ER 10 MEQ PO TBCR
20.0000 meq | EXTENDED_RELEASE_TABLET | Freq: Every day | ORAL | 1 refills | Status: DC
Start: 2023-05-09 — End: 2023-12-19

## 2023-05-09 MED ORDER — ROSUVASTATIN CALCIUM 40 MG PO TABS: 40.0000 mg | ORAL_TABLET | Freq: Every day | ORAL | 1 refills | Status: DC

## 2023-05-09 MED ORDER — METFORMIN HCL 500 MG PO TABS
500.0000 mg | ORAL_TABLET | Freq: Every day | ORAL | 1 refills | Status: DC
Start: 2023-05-09 — End: 2023-12-19

## 2023-05-09 MED ORDER — LOSARTAN POTASSIUM 100 MG PO TABS: 100.0000 mg | ORAL_TABLET | Freq: Every day | ORAL | 1 refills | Status: DC

## 2023-05-09 MED ORDER — CHLORTHALIDONE 25 MG PO TABS: ORAL_TABLET | ORAL | 1 refills | Status: DC

## 2023-05-09 NOTE — Progress Notes (Signed)
Date:  05/09/2023   Name:  Kelsey Terry   DOB:  02/10/1961   MRN:  [id number]   Chief Complaint: Hypertension, Diabetes, and Hyperlipidemia  Hypertension This is a chronic problem. The current episode started more than 1 year ago. The problem has been gradually improving since onset. The problem is controlled. Associated symptoms include peripheral edema. Pertinent negatives include no blurred vision, chest pain, headaches, orthopnea, palpitations, PND or shortness of breath. Risk factors for coronary artery disease include dyslipidemia. Past treatments include angiotensin blockers and diuretics. The current treatment provides moderate improvement. There are no compliance problems.  Hypertensive end-organ damage includes CAD/MI. There is no history of angina or CVA. stents. There is no history of chronic renal disease, a hypertension causing med or renovascular disease.  Diabetes She presents for her follow-up diabetic visit. She has type 2 diabetes mellitus. Her disease course has been stable. Pertinent negatives for hypoglycemia include no dizziness or headaches. Pertinent negatives for diabetes include no blurred vision, no chest pain and no fatigue. There are no hypoglycemic complications. Symptoms are stable. There are no diabetic complications. Pertinent negatives for diabetic complications include no CVA. Risk factors for coronary artery disease include hypertension and dyslipidemia. Current diabetic treatment includes oral agent (monotherapy). She is following a generally healthy diet. Meal planning includes avoidance of concentrated sweets and carbohydrate counting. An ACE inhibitor/angiotensin II receptor blocker is being taken.  Hyperlipidemia This is a chronic problem. The problem is controlled. Recent lipid tests were reviewed and are normal. Exacerbating diseases include diabetes. She has no history of chronic renal disease. Factors aggravating her hyperlipidemia include thiazides.  Pertinent negatives include no chest pain or shortness of breath. Current antihyperlipidemic treatment includes statins. The current treatment provides moderate improvement of lipids. There are no compliance problems.  Risk factors for coronary artery disease include dyslipidemia and hypertension.    Lab Results  Component Value Date   NA 143 04/23/2023   K 3.7 04/23/2023   CO2 28 04/23/2023   GLUCOSE 86 04/23/2023   BUN 14 04/23/2023   CREATININE 0.99 04/23/2023   CALCIUM 9.9 04/23/2023   EGFR 64 04/23/2023   GFRNONAA 60 (L) 06/27/2020   Lab Results  Component Value Date   CHOL 152 03/01/2022   HDL 57 03/01/2022   LDLCALC 84 03/01/2022   TRIG 53 03/01/2022   CHOLHDL 2.9 01/20/2020   Lab Results  Component Value Date   TSH 1.490 04/23/2023   Lab Results  Component Value Date   HGBA1C 6.0 (H) 11/06/2022   Lab Results  Component Value Date   WBC 3.2 (L) 04/23/2023   HGB 12.4 04/23/2023   HCT 39.4 04/23/2023   MCV 85 04/23/2023   PLT 169 04/23/2023   Lab Results  Component Value Date   ALT 17 03/01/2022   AST 24 03/01/2022   ALKPHOS 56 03/01/2022   BILITOT 0.7 03/01/2022   No results found for: "25OHVITD2", "25OHVITD3", "VD25OH"   Review of Systems  Constitutional:  Negative for diaphoresis, fatigue and unexpected weight change.  HENT:  Negative for trouble swallowing.   Eyes:  Negative for blurred vision and visual disturbance.  Respiratory:  Negative for cough, chest tightness, shortness of breath and wheezing.   Cardiovascular:  Negative for chest pain, palpitations, orthopnea, leg swelling and PND.  Gastrointestinal:  Negative for abdominal pain, blood in stool and diarrhea.  Genitourinary:  Negative for difficulty urinating, dysuria and vaginal bleeding.  Skin:  Negative for color change.  Neurological:  Negative  for dizziness and headaches.    Patient Active Problem List   Diagnosis Date Noted   Cough 03/31/2022   Hyperlipidemia 03/06/2021   Familial  hypercholesterolemia 08/23/2020   Acute bilateral knee pain 01/13/2020   Health care maintenance 11/24/2019   H/O elevated lipids 11/04/2019   Lymphedema 08/26/2019   Chronic venous insufficiency 08/26/2019   Encounter for screening colonoscopy    Polyp of sigmoid colon    Polyp of ascending colon    Lymphedema of left leg 06/16/2019   Prediabetes 06/16/2019   Abnormal laboratory test result 06/16/2019   Prediabetes 05/20/2019   Essential hypertension 05/19/2019   Abscess of left buttock 05/05/2019   Essential hypertension 05/05/2019   CAD (coronary artery disease) 05/05/2019   NSTEMI (non-ST elevated myocardial infarction) (HCC) 05/16/2018    Allergies  Allergen Reactions   Atrovent Nasal Spray [Ipratropium]     dizziness   Dorzolamide Other (See Comments)    Chest discomfort   Latanoprost Other (See Comments)    Chest discomfort   Timolol Other (See Comments)    Chest discomfort   Amoxicillin Itching and Other (See Comments)    Reaction: bump in vaginal region Has patient had a PCN reaction causing immediate rash, facial/tongue/throat swelling, SOB or lightheadedness with hypotension: No Has patient had a PCN reaction causing severe rash involving mucus membranes or skin necrosis: No Has patient had a PCN reaction that required hospitalization: No Has patient had a PCN reaction occurring within the last 10 years: No If all of the above answers are "NO", then may proceed with Cephalosporin use.    Erythromycin Hives and Rash    Past Surgical History:  Procedure Laterality Date   ABDOMINAL HYSTERECTOMY     CESAREAN SECTION     COLONOSCOPY WITH PROPOFOL N/A 08/13/2019   Procedure: COLONOSCOPY WITH BIOPSIES;  Surgeon: Midge Minium, MD;  Location: [facility];  Service: Endoscopy;  Laterality: N/A;   CORONARY STENT INTERVENTION N/A 05/16/2018   Procedure: CORONARY STENT INTERVENTION;  Surgeon: Iran Ouch, MD;  Location: [facility];  Service:  Cardiovascular;  Laterality: N/A;   CORONARY STENT INTERVENTION N/A 05/19/2018   Procedure: CORONARY STENT INTERVENTION;  Surgeon: Iran Ouch, MD;  Location: [facility];  Service: Cardiovascular;  Laterality: N/A;   LEFT HEART CATH AND CORONARY ANGIOGRAPHY N/A 05/16/2018   Procedure: LEFT HEART CATH AND CORONARY ANGIOGRAPHY;  Surgeon: Iran Ouch, MD;  Location: [facility];  Service: Cardiovascular;  Laterality: N/A;   PERICARDIOCENTESIS     POLYPECTOMY N/A 08/13/2019   Procedure: POLYPECTOMY;  Surgeon: Midge Minium, MD;  Location: [facility];  Service: Endoscopy;  Laterality: N/A;    Social History   Tobacco Use   Smoking status: Never   Smokeless tobacco: Never  Vaping Use   Vaping Use: Never used  Substance Use Topics   Alcohol use: Never   Drug use: Never     Medication list has been reviewed and updated.  Current Meds  Medication Sig   aspirin EC (ASPIRIN LOW DOSE) 81 MG tablet Take 1 tablet (81 mg total) by mouth daily. Swallow whole.   Blood Pressure KIT 1 kit by Does not apply route daily.   CALCIUM CITRATE-VITAMIN D PO Take 1 tablet by mouth 2 (two) times daily.   chlorthalidone (HYGROTON) 25 MG tablet TAKE 1/2 TABLET(12.5MG      TOTAL) DAILY.   losartan (COZAAR) 100 MG tablet Take 1 tablet (100 mg total) by mouth daily.  metFORMIN (GLUCOPHAGE) 500 MG tablet Take 1 tablet (500 mg total) by mouth daily.   Multiple Vitamin (MULTIVITAMIN) tablet Take 1 tablet by mouth daily.   potassium chloride (KLOR-CON) 10 MEQ tablet Take 2 tablets (20 mEq total) by mouth daily.   rosuvastatin (CRESTOR) 40 MG tablet Take 1 tablet (40 mg total) by mouth daily.       05/09/2023    9:10 AM 04/22/2023    4:36 PM 07/05/2022    3:15 PM 05/10/2022    4:16 PM  GAD 7 : Generalized Anxiety Score  Nervous, Anxious, on Edge 0 0 0 0  Control/stop worrying 0 0 0 0  Worry too much - different things 0 0 0 0  Trouble relaxing 0 0 0 0  Restless 0 0 0 0   Easily annoyed or irritable 0 0 0 0  Afraid - awful might happen 0 0 0 0  Total GAD 7 Score 0 0 0 0  Anxiety Difficulty Not difficult at all Not difficult at all Not difficult at all Not difficult at all       05/09/2023    9:10 AM 04/22/2023    4:36 PM 07/05/2022    3:15 PM  Depression screen PHQ 2/9  Decreased Interest 0 0 0  Down, Depressed, Hopeless 0 0 0  PHQ - 2 Score 0 0 0  Altered sleeping 0 0 0  Tired, decreased energy 0 0 0  Change in appetite 0 0 0  Feeling bad or failure about yourself  0 0 0  Trouble concentrating 0 0 0  Moving slowly or fidgety/restless 0 0 0  Suicidal thoughts 0 0 0  PHQ-9 Score 0 0 0  Difficult doing work/chores Not difficult at all Not difficult at all Not difficult at all    BP Readings from Last 3 Encounters:  05/09/23 102/76  04/22/23 118/78  04/16/23 113/73    Physical Exam Vitals and nursing note reviewed.  Constitutional:      Appearance: She is well-developed.  HENT:     Head: Normocephalic.     Right Ear: External ear normal.     Left Ear: External ear normal.  Eyes:     General: Lids are everted, no foreign bodies appreciated. No scleral icterus.       Left eye: No foreign body or hordeolum.     Conjunctiva/sclera: Conjunctivae normal.     Right eye: Right conjunctiva is not injected.     Left eye: Left conjunctiva is not injected.     Pupils: Pupils are equal, round, and reactive to light.  Neck:     Thyroid: No thyromegaly.     Vascular: No JVD.     Trachea: No tracheal deviation.  Cardiovascular:     Rate and Rhythm: Normal rate and regular rhythm.     Pulses: Normal pulses.     Heart sounds: Normal heart sounds, S1 normal and S2 normal. No murmur heard.    No systolic murmur is present.     No diastolic murmur is present.     No friction rub. No gallop. No S3 or S4 sounds.  Pulmonary:     Effort: Pulmonary effort is normal. No respiratory distress.     Breath sounds: Normal breath sounds. No decreased breath  sounds, wheezing, rhonchi or rales.  Abdominal:     General: Bowel sounds are normal.     Palpations: Abdomen is soft. There is no mass.     Tenderness: There is no  abdominal tenderness. There is no guarding or rebound.  Musculoskeletal:        General: No tenderness.     Cervical back: Normal range of motion and neck supple.     Right lower leg: 2+ Pitting Edema present.     Left lower leg: 2+ Pitting Edema present.  Lymphadenopathy:     Cervical: No cervical adenopathy.  Skin:    General: Skin is warm.     Findings: No rash.  Neurological:     Mental Status: She is alert.     Cranial Nerves: No cranial nerve deficit.     Deep Tendon Reflexes: Reflexes normal.     Reflex Scores:      Patellar reflexes are 2+ on the right side. Psychiatric:        Mood and Affect: Mood is not anxious or depressed.     Wt Readings from Last 3 Encounters:  05/09/23 167 lb (75.8 kg)  04/22/23 171 lb (77.6 kg)  04/10/23 174 lb (78.9 kg)    BP 102/76   Pulse 62   Ht 5\' 3"  (1.6 m)   Wt 167 lb (75.8 kg)   SpO2 99%   BMI 29.58 kg/m   Assessment and Plan: 1. Essential hypertension Chronic.  Controlled.  Stable.  Blood pressure 102/76.  Asymptomatic.  Tolerating medication well.  Continue chlorthalidone 25 mg 1/2 tablet daily.  Will check CMP for electrolytes and GFR. - chlorthalidone (HYGROTON) 25 MG tablet; TAKE 1/2 TABLET(12.5MG      TOTAL) DAILY.  Dispense: 45 tablet; Refill: 1 - losartan (COZAAR) 100 MG tablet; Take 1 tablet (100 mg total) by mouth daily.  Dispense: 90 tablet; Refill: 1 - potassium chloride (KLOR-CON) 10 MEQ tablet; Take 2 tablets (20 mEq total) by mouth daily.  Dispense: 180 tablet; Refill: 1 - Comprehensive Metabolic Panel (CMET) - HgB A1c  2. Mixed hyperlipidemia Chronic.  Controlled.  Stable.  Will continue to monitor with continuance of Crestor 40 mg once a day.  Recheck of previous lipid is acceptable but we will recheck lipid panel for current level of  control..  3. Prediabetes Chronic.  Controlled.  Stable.  Continue metformin 500 mg daily.  Will check A1c for current status of control. - metFORMIN (GLUCOPHAGE) 500 MG tablet; Take 1 tablet (500 mg total) by mouth daily.  Dispense: 90 tablet; Refill: 1 - HgB A1c  4. 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  Controlled.  Stable.  Continue rosuvastatin 40 mg once a day.  Will check lipid panel for current level of control. - rosuvastatin (CRESTOR) 40 MG tablet; Take 1 tablet (40 mg total) by mouth daily.  Dispense: 90 tablet; Refill: 1 - Lipid Panel With LDL/HDL Ratio     Elizabeth Sauer, MD

## 2023-05-10 ENCOUNTER — Ambulatory Visit: Payer: 59 | Admitting: Occupational Therapy

## 2023-05-10 DIAGNOSIS — I890 Lymphedema, not elsewhere classified: Secondary | ICD-10-CM

## 2023-05-10 LAB — LIPID PANEL WITH LDL/HDL RATIO
Cholesterol, Total: 168 mg/dL (ref 100–199)
HDL: 63 mg/dL (ref >39)
LDL Chol Calc (NIH): 93 mg/dL (ref 0–99)
LDL/HDL Ratio: 1.5 ratio (ref 0.0–3.2)
Triglycerides: 60 mg/dL (ref 0–149)
VLDL Cholesterol Cal: 12 mg/dL (ref 5–40)

## 2023-05-10 LAB — HEMOGLOBIN A1C
Est. average glucose Bld gHb Est-mCnc: 131 mg/dL
Hgb A1c MFr Bld: 6.2 % — ABNORMAL HIGH (ref 4.8–5.6)

## 2023-05-10 LAB — COMPREHENSIVE METABOLIC PANEL
ALT: 16 IU/L (ref 0–32)
AST: 23 IU/L (ref 0–40)
Albumin: 4.2 g/dL (ref 3.9–4.9)
Alkaline Phosphatase: 54 IU/L (ref 44–121)
BUN/Creatinine Ratio: 15 (ref 12–28)
BUN: 16 mg/dL (ref 8–27)
Bilirubin Total: 0.8 mg/dL (ref 0.0–1.2)
CO2: 28 mmol/L (ref 20–29)
Calcium: 10 mg/dL (ref 8.7–10.3)
Chloride: 102 mmol/L (ref 96–106)
Creatinine, Ser: 1.08 mg/dL — ABNORMAL HIGH (ref 0.57–1.00)
Globulin, Total: 3.4 g/dL (ref 1.5–4.5)
Glucose: 87 mg/dL (ref 70–99)
Potassium: 3.7 mmol/L (ref 3.5–5.2)
Sodium: 142 mmol/L (ref 134–144)
Total Protein: 7.6 g/dL (ref 6.0–8.5)
eGFR: 58 mL/min/{1.73_m2} — ABNORMAL LOW (ref >59)

## 2023-05-10 NOTE — Therapy (Signed)
OUTPATIENT OCCUPATIONAL THERAPY TREATMENT NOTE AND PROGRESS REPORT LOWER EXTREMITY LYMPHEDEMA  Patient Name: Seleena Reimers MRN: [id number] DOB:Jun 13, 1961, 62 y.o., female Today's Date: 04/24/23  REPORTING PERIOD: 03/14/23 - 05/07/23  END OF SESSION:   OT End of Session - 05/10/23 0900     Visit Number 11    Number of Visits 36    Date for OT Re-Evaluation 06/12/23    OT Start Time 0900    Activity Tolerance Patient tolerated treatment well;No increased pain    Behavior During Therapy [facility] for tasks assessed/performed               Past Medical History:  Diagnosis Date   Allergies    Asthma    Blood dyscrasia    blood clot in right leg   CAD (coronary artery disease) 05/05/2019   CAD in native artery    a. NSTEMI 05/16/18: LHC 05/16/18 - ost-pLAD 95% s/p PCI/DES, mLAD 30%, ostD1 70%, ost-pLCx 30%, mRCA 90%, dRCA 20%; b. staged PCI/DES to [facility] 7/1   Complication of anesthesia    patient stated she had "some kind of reaction to anesthesia, "thought she was having a stroke" during her second cath procedure.   Diabetes mellitus without complication (HCC)    pre-diabetes   Diastolic dysfunction    a. TTE 05/16/18: EF 55-60%, no RWMA, Gr1DD, trivial AI, calcified mitral annulus, mild MR, mildly dilated LA   Dyspnea    with activity   Hypertension    Lymphedema    LLE   Pericarditis    a. ~ 2012 in [place], [place] s/p pericardiocentesis    Wears contact lenses    Past Surgical History:  Procedure Laterality Date   ABDOMINAL HYSTERECTOMY     CESAREAN SECTION     COLONOSCOPY WITH PROPOFOL N/A 08/13/2019   Procedure: COLONOSCOPY WITH BIOPSIES;  Surgeon: Midge Minium, MD;  Location: [facility];  Service: Endoscopy;  Laterality: N/A;   CORONARY STENT INTERVENTION N/A 05/16/2018   Procedure: CORONARY STENT INTERVENTION;  Surgeon: Iran Ouch, MD;  Location: [facility] INVAS[facility] CV [facility];  Service: Cardiovascular;  Laterality: N/A;   CORONARY STENT INTERVENTION N/A 05/19/2018    Procedure: CORONARY STENT INTERVENTION;  Surgeon: Iran Ouch, MD;  Location: [facility] INVASIVE CV LAB;  Service: Cardiovascular;  Laterality: N/A;   LEFT HEART CATH AND CORONARY ANGIOGRAPHY N/A 05/16/2018   Procedure: LEFT HEART CATH AND CORONARY ANGIOGRAPHY;  Surgeon: Iran Ouch, MD;  Location: [facility] INVASIVE CV LAB;  Service: Cardiovascular;  Laterality: N/A;   PERICARDIOCENTESIS     POLYPECTOMY N/A 08/13/2019   Procedure: POLYPECTOMY;  Surgeon: Midge Minium, MD;  Location: [facility];  Service: Endoscopy;  Laterality: N/A;   Patient Active Problem List   Diagnosis Date Noted   Cough 03/31/2022   Hyperlipidemia 03/06/2021   Familial hypercholesterolemia 08/23/2020   Acute bilateral knee pain 01/13/2020   Health care maintenance 11/24/2019   H/O elevated lipids 11/04/2019   Lymphedema 08/26/2019   Chronic venous insufficiency 08/26/2019   Encounter for screening colonoscopy    Polyp of sigmoid colon    Polyp of ascending colon    Lymphedema of left leg 06/16/2019   Prediabetes 06/16/2019   Abnormal laboratory test result 06/16/2019   Prediabetes 05/20/2019   Essential hypertension 05/19/2019   Abscess of left buttock 05/05/2019   Essential hypertension 05/05/2019   CAD (coronary artery disease) 05/05/2019   NSTEMI (non-ST elevated myocardial infarction) (HCC) 05/16/2018    PCP: Duanne Limerick, MD  REFERRING  PROVIDER: Levora Dredge, MD  REFERRING DIAG: I89.0  THERAPY DIAG:  Lymphedema, not elsewhere classified  Rationale for Evaluation and Treatment: Rehabilitation  ONSET DATE: 03/14/23  SUBJECTIVE:                                                                                                                                                                                           SUBJECTIVE STATEMENT: Ms Lippert presents for OT visit to address BLE lymphedema, L>R. Pt denies LE-related leg pain. Pt states she feels like Rx is going well. Pt has no new  complaints  PERTINENT HISTORY: LE, CVI, HTN, Prediabetes, CAD, s/p NSTEMI 04/2028  PAIN: Are you having lymphedema -related  pain? No  PRECAUTIONS: Other: LYMPHEDEMA PRECAUTIONS: CARDIAC, DM- skin  FALLS: Has patient fallen since last visit? No  HAND DOMINANCE: right   PRIOR LEVEL OF FUNCTION: Independent  PATIENT GOALS: to be able to do something better to take care of my legs; to learn about any new management technology; to keep the lymphedema from getting worse."   OBJECTIVE:  OBSERVATIONS / OTHER ASSESSMENTS: Moderate, Stage  II, Primary, Bilateral Lower Extremity Lymphedema, L>R (Lymphedema Praecox)  FOTO functional outcome score: Intake 03/14/23: 81%  Lymphedema Life Impact Scale (LLIS) Intake 03/14/23: 20.59% (The extent to which lymphedema -related problems impacted your life in the past week)    BLE COMPARATIVE LIMB VOLUMETRICS Intake Visit 1 03/21/23  LANDMARK RIGHT    R LEG (A-D) 3627.8 ml  R THIGH (E-G) 6402.0 ml  R FULL LIMB (A-G) 10029.8 ml  Limb Volume differential (LVD)  %  Volume change since initial %  Volume change overall V  (Blank rows = not tested)  LANDMARK LEFT    L LEG (A-D) 4875.0 ml  L THIGH (E-G) 6129.6 ml  L  FULL LIMB (A-G) 11004.6 ml  Limb Volume differential (LVD)  Leg LVD = 25.6%, L>R Thigh LVD = 4.41%, R (dominant)>L Full Limb LVD = 8.85%, L>R%  Volume change since initial %  Volume change overall %  (Blank rows = not tested)   LLE COMPARATIVE LIMB VOLUMETRICS 9 th Rx Visit  05/03/23  LANDMARK LEFT    L LEG (A-D) 4609.5 ml  L THIGH (E-G) 6182.3 ml  L  FULL LIMB (A-G) 10791.9 ml  Limb Volume differential (LVD)  L LEG reduction = 5.45% since commencing CDT on 03/21/23.  L THIGH is essentially unchanged with INCREASED volume measuring 0.86%. Full LLE volume reduction measures 1.93% to date.  Volume change since initial %  Volume change overall %  (Blank rows = not tested)   TODAY'S TREATMENT:  LLE MLD Multilayer, knee  length,  LLE compression wrap                                                                                                                              PATIENT EDUCATION:  Continued Pt/ CG edu for lymphedema self care home program throughout session. Topics include outcome of comparative limb volumetrics- starting limb volume differentials (LVDs), technology and gradient techniques used for short stretch, multilayer compression wrapping, simple self-MLD, therapeutic lymphatic pumping exercises, skin/nail care, LE precautions,. compression garment recommendations and specifications, wear and care schedule and compression garment donning / doffing w assistive devices. Discussed progress towards all OT goals since commencing CDT. All questions answered to the Pt's satisfaction. Good return.  Person educated: Patient Education method: Explanation, Demonstration, and Handouts Education comprehension: verbalized understanding, returned demonstration, and needs further education  LE SELF-CARE HOME PROGRAM: BLE Lymphatic Pumping There ex Skin care to limit infection risk Compression Intensive stage compression: multilayer short stretch wraps with gradient techniques. One limb at a time. Self-management Phase Compression: Custom, flat knit, BLE compression knee highs. Consider Elvarex classic for daytime and Jobst Relax for HOS Simple self-MLD LLE/LLQ MLD utilizing short neck sequence, deep, diaphragmatic breathing, stationary J strokes to functional inguinal LN at L groin, then dynamic J strokes from proximal to distal from thigh, knee, leg and foot, with 3 sweeps to terminus to complete session.   Custom-made gradient compression garments and HOS devices are medically necessary in this case because they are uniquely sized and shaped to fit the exact dimensions of the affected extremities with deformities, and to provide accurate and consistent gradient compression and containment, essential to optimally managing  this patient's symptoms of chronic, progressive lymphedema. Multiple custom compression garments are needed for optimal hygiene to limit infection risk. Custom compression garments should be replaced q 3-6 months When worn consistently for optimal lipo-lymphedema self-management over time.  ASSESSMENT: CLINICAL IMPRESSION:  Pt tolerated MLD without increased pain today. Was able to move fluid congestion out of dorsal foot with deeper techniques. Fibrosis in distal leg and foot remains stubbornly in tact. Reapplied compression wraps at end of session. Cont as per POC.  OBJECTIVE IMPAIRMENTS: decreased knowledge of condition, decreased knowledge of use of DME, decreased mobility, increased edema, pain, and chronic lower extremity swelling and associated pain, lymphedema related skin changes ( decreased excursion, increased skin approximation) increased infection risk  ACTIVITY LIMITATIONS: sitting tolerance, standing tolerance, and functional ambulation. Basic and Instrumental ADLS ( fitting shoes, socks and lower body clothing, grooming nails, inspecting skin, performing skin care, driving, shopping, housework) leisure pursuits (walking for exercise), productive activities ( caring for others, work duties),   PARTICIPATION LIMITATIONS:  negative body image limits clothing selection and social participation  PERSONAL FACTORS: Time since onset of onset/ level of progression; exacerbating co morbidities: : HTN, CVI   REHAB POTENTIAL: Good  EVALUATION COMPLEXITY: Moderate   GOALS: Goals reviewed with patient? Yes  SHORT TERM GOALS: Target date: 4th  OT Rx visit  Pt will demonstrate understanding of lymphedema precautions and prevention strategies with modified independence using a printed reference to identify at least 5 precautions and discussing how s/he may implement them into daily life to reduce risk of progression with extra time. Baseline:Max A Goal status: 05/07/23 GOAL MET  2.  Pt will  be able to apply multilayer, thigh length, gradient, compression wraps to one leg at a time with modified assistance (extra time) to decrease limb volume, to limit infection risk, and to limit lymphedema progression.  Baseline: Dependent Goal status: 05/07/23 GOAL MET  LONG TERM GOALS: Target date: 06/12/23  Given this patient's Intake score of 81/100% on the functional outcomes FOTO tool, patient will experience an increase in function of 3 points to improve basic and instrumental ADLs performance, including lymphedema self-care.  Baseline: 81% Goal status: 05/07/23 PROGRESSING  2.  Given this patient's Intake score of 20.59 % on the Lymphedema Life Impact Scale (LLIS), patient will experience a reduction of at least 5 points in her perceived level of functional impairment resulting from lymphedema to improve functional performance and quality of life (QOL). Baseline: 20.59% Goal status: 05/07/23 GOAL MET  3.  During Intensive phase CDT Pt will achieve at least 85% compliance with all lymphedema self-care home program components, including daily skin care, compression wraps and /or garments, simple self MLD and lymphatic pumping therex to habituate LE self care protocol  into ADLs for optimal LE self-management over time. Baseline: Dependent Goal status:05/07/23 GOAL MET  4.  Pt will achieve at least a 10% volume reduction in the BLE to return limb to typical size and shape, to limit infection risk and LE progression, to decrease pain, to improve function. Baseline: Dependent Goal status: 05/07/23 PROGRESSING: L LEG reduction = 5.45% since commencing CDT on 03/21/23.  L THIGH is essentially unchanged with INCREASED volume measuring 0.86%. Full LLE volume reduction measures 1.93% to date.  5.  STG: Pt will obtain proper compression garments/devices and achieve modified independence (extra time + assistive devices) with donning/doffing to optimize limb volume reductions and limit LE  progression over  time. Baseline: Max A Goal status: 05/07/23 PROGRESSING  PLAN:  PT FREQUENCY: 2x/weekand PRN  PT DURATION: other: and PRN  PLANNED INTERVENTIONS: Complete Decongestive Therapy (CDT): Therapeutic lymphatic pumping exercises, skin care simultaneously w MLD to limit infection risk, Manual lymph drainage (MLD), During Intensive Phase- multilayer Compression bandaging; during self-management phase formulate appropriate compression garments and devices, complete anatomical measurements, fitting and assessment t of garments; Consider trial of Flexitouch ADVANCED sequential compression device as replacement for incorrect basic vaso pneumatic device. Patient/Family education, DME instructions,    PLAN FOR NEXT SESSION:  Cont Pt edu for simple self-MLD, wrapping, there ex Continue MLD to LLE/LLQ. Cont LLE knee length multilayer compression wrap   Loel Dubonnet, MS, OTR/L, CLT-LANA 05/10/23 9:00 AM

## 2023-05-13 ENCOUNTER — Telehealth: Payer: Self-pay | Admitting: Family Medicine

## 2023-05-13 NOTE — Telephone Encounter (Signed)
Copied from CRM [id number]. Topic: Appointment Scheduling - Scheduling Inquiry for Clinic >> May 13, 2023 10:04 AM Franchot Heidelberg wrote: Reason for CRM: Pt would like to have a TDAP injection. Please advise

## 2023-05-14 ENCOUNTER — Ambulatory Visit: Payer: 59 | Admitting: Occupational Therapy

## 2023-05-16 ENCOUNTER — Ambulatory Visit: Payer: 59 | Admitting: Occupational Therapy

## 2023-05-16 DIAGNOSIS — I890 Lymphedema, not elsewhere classified: Secondary | ICD-10-CM

## 2023-05-16 NOTE — Therapy (Deleted)
OUTPATIENT OCCUPATIONAL THERAPY TREATMENT NOTE AND PROGRESS REPORT LOWER EXTREMITY LYMPHEDEMA  Patient Name: Kelsey Terry MRN: [id number] DOB:[birth year], 62 y.o., female Today's Date: 05/16/23    END OF SESSION:   OT End of Session - 05/16/23 0918     Visit Number 12    Number of Visits 36    Date for OT Re-Evaluation 06/12/23    OT Start Time 0913    OT Stop Time 0957    OT Time Calculation (min) 44 min    Activity Tolerance Patient tolerated treatment well;No increased pain    Behavior During Therapy [facility] for tasks assessed/performed               Past Medical History:  Diagnosis Date   Allergies    Asthma    Blood dyscrasia    blood clot in right leg   CAD (coronary artery disease) 05/05/2019   CAD in native artery    a. NSTEMI 05/16/18: LHC 05/16/18 - ost-pLAD 95% s/p PCI/DES, mLAD 30%, ostD1 70%, ost-pLCx 30%, mRCA 90%, dRCA 20%; b. staged PCI/DES to [facility] 7/1   Complication of anesthesia    patient stated she had "some kind of reaction to anesthesia, "thought she was having a stroke" during her second cath procedure.   Diabetes mellitus without complication (HCC)    pre-diabetes   Diastolic dysfunction    a. TTE 05/16/18: EF 55-60%, no RWMA, Gr1DD, trivial AI, calcified mitral annulus, mild MR, mildly dilated LA   Dyspnea    with activity   Hypertension    Lymphedema    LLE   Pericarditis    a. ~ 2012 in [place], [place] s/p pericardiocentesis    Wears contact lenses    Past Surgical History:  Procedure Laterality Date   ABDOMINAL HYSTERECTOMY     CESAREAN SECTION     COLONOSCOPY WITH PROPOFOL N/A 08/13/2019   Procedure: COLONOSCOPY WITH BIOPSIES;  Surgeon: Midge Minium, MD;  Location: [facility];  Service: Endoscopy;  Laterality: N/A;   CORONARY STENT INTERVENTION N/A 05/16/2018   Procedure: CORONARY STENT INTERVENTION;  Surgeon: Iran Ouch, MD;  Location: [facility] INVASIVE CV LAB;  Service: Cardiovascular;  Laterality: N/A;   CORONARY STENT  INTERVENTION N/A 05/19/2018   Procedure: CORONARY STENT INTERVENTION;  Surgeon: Iran Ouch, MD;  Location: [facility] INVASIVE CV LAB;  Service: Cardiovascular;  Laterality: N/A;   LEFT HEART CATH AND CORONARY ANGIOGRAPHY N/A 05/16/2018   Procedure: LEFT HEART CATH AND CORONARY ANGIOGRAPHY;  Surgeon: Iran Ouch, MD;  Location: [facility] INVASIVE CV LAB;  Service: Cardiovascular;  Laterality: N/A;   PERICARDIOCENTESIS     POLYPECTOMY N/A 08/13/2019   Procedure: POLYPECTOMY;  Surgeon: Midge Minium, MD;  Location: [facility];  Service: Endoscopy;  Laterality: N/A;   Patient Active Problem List   Diagnosis Date Noted   Cough 03/31/2022   Hyperlipidemia 03/06/2021   Familial hypercholesterolemia 08/23/2020   Acute bilateral knee pain 01/13/2020   Health care maintenance 11/24/2019   H/O elevated lipids 11/04/2019   Lymphedema 08/26/2019   Chronic venous insufficiency 08/26/2019   Encounter for screening colonoscopy    Polyp of sigmoid colon    Polyp of ascending colon    Lymphedema of left leg 06/16/2019   Prediabetes 06/16/2019   Abnormal laboratory test result 06/16/2019   Prediabetes 05/20/2019   Essential hypertension 05/19/2019   Abscess of left buttock 05/05/2019   Essential hypertension 05/05/2019   CAD (coronary artery disease) 05/05/2019   NSTEMI (non-ST elevated myocardial infarction) (  HCC) 05/16/2018    PCP: Duanne Limerick, MD  REFERRING PROVIDER: Levora Dredge, MD  REFERRING DIAG: I89.0  THERAPY DIAG:  Lymphedema, not elsewhere classified  Rationale for Evaluation and Treatment: Rehabilitation  ONSET DATE: 03/14/23  SUBJECTIVE:                                                                                                                                                                                           SUBJECTIVE STATEMENT: Kelsey Terry presents for OT visit to address BLE lymphedema, L>R. Pt is 13 minutes late for a 60 minute session due to  transportation problems. Pt denies LE-related leg pain. Pt states she feels like Rx is going well. Pt has no new complaints  PERTINENT HISTORY: LE, CVI, HTN, Prediabetes, CAD, s/p NSTEMI 04/2028  PAIN: Are you having lymphedema -related  pain? No  PRECAUTIONS: Other: LYMPHEDEMA PRECAUTIONS: CARDIAC, DM- skin  FALLS: Has patient fallen since last visit? No  HAND DOMINANCE: right   PRIOR LEVEL OF FUNCTION: Independent  PATIENT GOALS: to be able to do something better to take care of my legs; to learn about any new management technology; to keep the lymphedema from getting worse."   OBJECTIVE:  OBSERVATIONS / OTHER ASSESSMENTS: Moderate, Stage  II, Primary, Bilateral Lower Extremity Lymphedema, L>R (Lymphedema [place]ox)  FOTO functional outcome score: Intake 03/14/23: 81%  Lymphedema Life Impact Scale (LLIS) Intake 03/14/23: 20.59% (The extent to which lymphedema -related problems impacted your life in the past week)    BLE COMPARATIVE LIMB VOLUMETRICS Intake Visit 1 03/21/23  LANDMARK RIGHT    R LEG (A-D) 3627.8 ml  R THIGH (E-G) 6402.0 ml  R FULL LIMB (A-G) 10029.8 ml  Limb Volume differential (LVD)  %  Volume change since initial %  Volume change overall V  (Blank rows = not tested)  LANDMARK LEFT    L LEG (A-D) 4875.0 ml  L THIGH (E-G) 6129.6 ml  L  FULL LIMB (A-G) 11004.6 ml  Limb Volume differential (LVD)  Leg LVD = 25.6%, L>R Thigh LVD = 4.41%, R (dominant)>L Full Limb LVD = 8.85%, L>R%  Volume change since initial %  Volume change overall %  (Blank rows = not tested)   LLE COMPARATIVE LIMB VOLUMETRICS 9 th Rx Visit  05/03/23  LANDMARK LEFT    L LEG (A-D) 4609.5 ml  L THIGH (E-G) 6182.3 ml  L  FULL LIMB (A-G) 10791.9 ml  Limb Volume differential (LVD)  L LEG reduction = 5.45% since commencing CDT on 03/21/23.  L THIGH is essentially unchanged with INCREASED volume measuring 0.86%. Full LLE volume reduction measures 1.93% to date.  Volume change since initial %   Volume change overall %  (Blank rows = not tested)   TODAY'S TREATMENT:  LLE MLD Multilayer, knee length, LLE compression wrap                                                                                                                              PATIENT EDUCATION:  Continued Pt/ CG edu for lymphedema self care home program throughout session. Topics include outcome of comparative limb volumetrics- starting limb volume differentials (LVDs), technology and gradient techniques used for short stretch, multilayer compression wrapping, simple self-MLD, therapeutic lymphatic pumping exercises, skin/nail care, LE precautions,. compression garment recommendations and specifications, wear and care schedule and compression garment donning / doffing w assistive devices. Discussed progress towards all OT goals since commencing CDT. All questions answered to the Pt's satisfaction. Good return.  Person educated: Patient Education method: Explanation, Demonstration, and Handouts Education comprehension: verbalized understanding, returned demonstration, and needs further education  LE SELF-CARE HOME PROGRAM: BLE Lymphatic Pumping There ex Skin care to limit infection risk Compression Intensive stage compression: multilayer short stretch wraps with gradient techniques. One limb at a time. Self-management Phase Compression: Custom, flat knit, BLE compression knee highs. Consider Elvarex classic for daytime and Jobst Relax for HOS Simple self-MLD LLE/LLQ MLD utilizing short neck sequence, deep, diaphragmatic breathing, stationary J strokes to functional inguinal LN at L groin, then dynamic J strokes from proximal to distal from thigh, knee, leg and foot, with 3 sweeps to terminus to complete session.   Custom-made gradient compression garments and HOS devices are medically necessary in this case because they are uniquely sized and shaped to fit the exact dimensions of the affected extremities with  deformities, and to provide accurate and consistent gradient compression and containment, essential to optimally managing this patient's symptoms of chronic, progressive lymphedema. Multiple custom compression garments are needed for optimal hygiene to limit infection risk. Custom compression garments should be replaced q 3-6 months When worn consistently for optimal lipo-lymphedema self-management over time.  ASSESSMENT: CLINICAL IMPRESSION:  Pt tolerated MLD with simultaneous skin care without increased pain today. Was able to move fluid congestion out of dorsal foot with deeper techniques. Fibrosis in distal leg and foot remains stubborn. Reapplied compression wraps at end of session. Cont as per POC.  OBJECTIVE IMPAIRMENTS: decreased knowledge of condition, decreased knowledge of use of DME, decreased mobility, increased edema, pain, and chronic lower extremity swelling and associated pain, lymphedema related skin changes ( decreased excursion, increased skin approximation) increased infection risk  ACTIVITY LIMITATIONS: sitting tolerance, standing tolerance, and functional ambulation. Basic and Instrumental ADLS ( fitting shoes, socks and lower body clothing, grooming nails, inspecting skin, performing skin care, driving, shopping, housework) leisure pursuits (walking for exercise), productive activities ( caring for others, work duties),   PARTICIPATION LIMITATIONS:  negative body image limits clothing selection and social participation  PERSONAL FACTORS: Time since onset of onset/ level of progression; exacerbating co morbidities: :  HTN, CVI   REHAB POTENTIAL: Good  EVALUATION COMPLEXITY: Moderate   GOALS: Goals reviewed with patient? Yes  SHORT TERM GOALS: Target date: 4th OT Rx visit  Pt will demonstrate understanding of lymphedema precautions and prevention strategies with modified independence using a printed reference to identify at least 5 precautions and discussing how s/he may  implement them into daily life to reduce risk of progression with extra time. Baseline:Max A Goal status: 05/07/23 GOAL MET  2.  Pt will be able to apply multilayer, thigh length, gradient, compression wraps to one leg at a time with modified assistance (extra time) to decrease limb volume, to limit infection risk, and to limit lymphedema progression.  Baseline: Dependent Goal status: 05/07/23 GOAL MET  LONG TERM GOALS: Target date: 06/12/23  Given this patient's Intake score of 81/100% on the functional outcomes FOTO tool, patient will experience an increase in function of 3 points to improve basic and instrumental ADLs performance, including lymphedema self-care.  Baseline: 81% Goal status: 05/07/23 PROGRESSING  2.  Given this patient's Intake score of 20.59 % on the Lymphedema Life Impact Scale (LLIS), patient will experience a reduction of at least 5 points in her perceived level of functional impairment resulting from lymphedema to improve functional performance and quality of life (QOL). Baseline: 20.59% Goal status: 05/07/23 GOAL MET  3.  During Intensive phase CDT Pt will achieve at least 85% compliance with all lymphedema self-care home program components, including daily skin care, compression wraps and /or garments, simple self MLD and lymphatic pumping therex to habituate LE self care protocol  into ADLs for optimal LE self-management over time. Baseline: Dependent Goal status:05/07/23 GOAL MET  4.  Pt will achieve at least a 10% volume reduction in the BLE to return limb to typical size and shape, to limit infection risk and LE progression, to decrease pain, to improve function. Baseline: Dependent Goal status: 05/07/23 PROGRESSING: L LEG reduction = 5.45% since commencing CDT on 03/21/23.  L THIGH is essentially unchanged with INCREASED volume measuring 0.86%. Full LLE volume reduction measures 1.93% to date.  5.  STG: Pt will obtain proper compression garments/devices and achieve  modified independence (extra time + assistive devices) with donning/doffing to optimize limb volume reductions and limit LE  progression over time. Baseline: Max A Goal status: 05/07/23 PROGRESSING  PLAN:  PT FREQUENCY: 2x/weekand PRN  PT DURATION: other: and PRN  PLANNED INTERVENTIONS: Complete Decongestive Therapy (CDT): Therapeutic lymphatic pumping exercises, skin care simultaneously w MLD to limit infection risk, Manual lymph drainage (MLD), During Intensive Phase- multilayer Compression bandaging; during self-management phase formulate appropriate compression garments and devices, complete anatomical measurements, fitting and assessment t of garments; Consider trial of Flexitouch ADVANCED sequential compression device as replacement for incorrect basic vaso pneumatic device. Patient/Family education, DME instructions,    PLAN FOR NEXT SESSION:  Cont Pt edu for simple self-MLD, wrapping, there ex Continue MLD to LLE/LLQ. Cont LLE knee length multilayer compression wrap   Loel Dubonnet, Kelsey, OTR/L, CLT-LANA 05/16/23 9:58 AM

## 2023-05-16 NOTE — Telephone Encounter (Signed)
Pt is calling to request an order for a TDAP. Please advise CB- [phone]

## 2023-05-16 NOTE — Telephone Encounter (Signed)
Spoke to patient to contact their insurance to see of they will cover it and she can either have it done here or at the pharmacy.

## 2023-05-16 NOTE — Therapy (Signed)
OUTPATIENT OCCUPATIONAL THERAPY TREATMENT NOTE AND PROGRESS REPORT LOWER EXTREMITY LYMPHEDEMA  Patient Name: Yaneisy Wenz MRN: [id number] DOB:[birth year], 62 y.o., female Today's Date: 05/16/23    END OF SESSION:   OT End of Session - 05/16/23 0918     Visit Number 12    Number of Visits 36    Date for OT Re-Evaluation 06/12/23    OT Start Time 0913    OT Stop Time 0957    OT Time Calculation (min) 44 min    Activity Tolerance Patient tolerated treatment well;No increased pain    Behavior During Therapy [facility] for tasks assessed/performed               Past Medical History:  Diagnosis Date   Allergies    Asthma    Blood dyscrasia    blood clot in right leg   CAD (coronary artery disease) 05/05/2019   CAD in native artery    a. NSTEMI 05/16/18: LHC 05/16/18 - ost-pLAD 95% s/p PCI/DES, mLAD 30%, ostD1 70%, ost-pLCx 30%, mRCA 90%, dRCA 20%; b. staged PCI/DES to [facility] 7/1   Complication of anesthesia    patient stated she had "some kind of reaction to anesthesia, "thought she was having a stroke" during her second cath procedure.   Diabetes mellitus without complication (HCC)    pre-diabetes   Diastolic dysfunction    a. TTE 05/16/18: EF 55-60%, no RWMA, Gr1DD, trivial AI, calcified mitral annulus, mild MR, mildly dilated LA   Dyspnea    with activity   Hypertension    Lymphedema    LLE   Pericarditis    a. ~ 2012 in [place], [place] s/p pericardiocentesis    Wears contact lenses    Past Surgical History:  Procedure Laterality Date   ABDOMINAL HYSTERECTOMY     CESAREAN SECTION     COLONOSCOPY WITH PROPOFOL N/A 08/13/2019   Procedure: COLONOSCOPY WITH BIOPSIES;  Surgeon: Midge Minium, MD;  Location: [facility];  Service: Endoscopy;  Laterality: N/A;   CORONARY STENT INTERVENTION N/A 05/16/2018   Procedure: CORONARY STENT INTERVENTION;  Surgeon: Iran Ouch, MD;  Location: [facility] INVASIVE CV LAB;  Service: Cardiovascular;  Laterality: N/A;   CORONARY STENT  INTERVENTION N/A 05/19/2018   Procedure: CORONARY STENT INTERVENTION;  Surgeon: Iran Ouch, MD;  Location: [facility] INVASIVE CV LAB;  Service: Cardiovascular;  Laterality: N/A;   LEFT HEART CATH AND CORONARY ANGIOGRAPHY N/A 05/16/2018   Procedure: LEFT HEART CATH AND CORONARY ANGIOGRAPHY;  Surgeon: Iran Ouch, MD;  Location: [facility] INVASIVE CV LAB;  Service: Cardiovascular;  Laterality: N/A;   PERICARDIOCENTESIS     POLYPECTOMY N/A 08/13/2019   Procedure: POLYPECTOMY;  Surgeon: Midge Minium, MD;  Location: [facility];  Service: Endoscopy;  Laterality: N/A;   Patient Active Problem List   Diagnosis Date Noted   Cough 03/31/2022   Hyperlipidemia 03/06/2021   Familial hypercholesterolemia 08/23/2020   Acute bilateral knee pain 01/13/2020   Health care maintenance 11/24/2019   H/O elevated lipids 11/04/2019   Lymphedema 08/26/2019   Chronic venous insufficiency 08/26/2019   Encounter for screening colonoscopy    Polyp of sigmoid colon    Polyp of ascending colon    Lymphedema of left leg 06/16/2019   Prediabetes 06/16/2019   Abnormal laboratory test result 06/16/2019   Prediabetes 05/20/2019   Essential hypertension 05/19/2019   Abscess of left buttock 05/05/2019   Essential hypertension 05/05/2019   CAD (coronary artery disease) 05/05/2019   NSTEMI (non-ST elevated myocardial infarction) (  HCC) 05/16/2018    PCP: Duanne Limerick, MD  REFERRING PROVIDER: Levora Dredge, MD  REFERRING DIAG: I89.0  THERAPY DIAG:  Lymphedema, not elsewhere classified  Rationale for Evaluation and Treatment: Rehabilitation  ONSET DATE: 03/14/23  SUBJECTIVE:                                                                                                                                                                                           SUBJECTIVE STATEMENT: Ms Janak presents for OT visit to address BLE lymphedema, L>R. Pt is 13 minutes late for a 60 minute session due to  transportation problems. Pt denies LE-related leg pain. Pt states she feels like Rx is going well. Pt has no new complaints  PERTINENT HISTORY: LE, CVI, HTN, Prediabetes, CAD, s/p NSTEMI 04/2028  PAIN: Are you having lymphedema -related  pain? No  PRECAUTIONS: Other: LYMPHEDEMA PRECAUTIONS: CARDIAC, DM- skin  FALLS: Has patient fallen since last visit? No  HAND DOMINANCE: right   PRIOR LEVEL OF FUNCTION: Independent  PATIENT GOALS: to be able to do something better to take care of my legs; to learn about any new management technology; to keep the lymphedema from getting worse."   OBJECTIVE:  OBSERVATIONS / OTHER ASSESSMENTS: Moderate, Stage  II, Primary, Bilateral Lower Extremity Lymphedema, L>R (Lymphedema [place]ox)  FOTO functional outcome score: Intake 03/14/23: 81%  Lymphedema Life Impact Scale (LLIS) Intake 03/14/23: 20.59% (The extent to which lymphedema -related problems impacted your life in the past week)    BLE COMPARATIVE LIMB VOLUMETRICS Intake Visit 1 03/21/23  LANDMARK RIGHT    R LEG (A-D) 3627.8 ml  R THIGH (E-G) 6402.0 ml  R FULL LIMB (A-G) 10029.8 ml  Limb Volume differential (LVD)  %  Volume change since initial %  Volume change overall V  (Blank rows = not tested)  LANDMARK LEFT    L LEG (A-D) 4875.0 ml  L THIGH (E-G) 6129.6 ml  L  FULL LIMB (A-G) 11004.6 ml  Limb Volume differential (LVD)  Leg LVD = 25.6%, L>R Thigh LVD = 4.41%, R (dominant)>L Full Limb LVD = 8.85%, L>R%  Volume change since initial %  Volume change overall %  (Blank rows = not tested)   LLE COMPARATIVE LIMB VOLUMETRICS 9 th Rx Visit  05/03/23  LANDMARK LEFT    L LEG (A-D) 4609.5 ml  L THIGH (E-G) 6182.3 ml  L  FULL LIMB (A-G) 10791.9 ml  Limb Volume differential (LVD)  L LEG reduction = 5.45% since commencing CDT on 03/21/23.  L THIGH is essentially unchanged with INCREASED volume measuring 0.86%. Full LLE volume reduction measures 1.93% to date.  Volume change since initial %   Volume change overall %  (Blank rows = not tested)   TODAY'S TREATMENT:  LLE MLD Multilayer, knee length, LLE compression wrap                                                                                                                              PATIENT EDUCATION:  Continued Pt/ CG edu for lymphedema self care home program throughout session. Topics include outcome of comparative limb volumetrics- starting limb volume differentials (LVDs), technology and gradient techniques used for short stretch, multilayer compression wrapping, simple self-MLD, therapeutic lymphatic pumping exercises, skin/nail care, LE precautions,. compression garment recommendations and specifications, wear and care schedule and compression garment donning / doffing w assistive devices. Discussed progress towards all OT goals since commencing CDT. All questions answered to the Pt's satisfaction. Good return.  Person educated: Patient Education method: Explanation, Demonstration, and Handouts Education comprehension: verbalized understanding, returned demonstration, and needs further education  LE SELF-CARE HOME PROGRAM: BLE Lymphatic Pumping There ex Skin care to limit infection risk Compression Intensive stage compression: multilayer short stretch wraps with gradient techniques. One limb at a time. Self-management Phase Compression: Custom, flat knit, BLE compression knee highs. Consider Elvarex classic for daytime and Jobst Relax for HOS Simple self-MLD LLE/LLQ MLD utilizing short neck sequence, deep, diaphragmatic breathing, stationary J strokes to functional inguinal LN at L groin, then dynamic J strokes from proximal to distal from thigh, knee, leg and foot, with 3 sweeps to terminus to complete session.   Custom-made gradient compression garments and HOS devices are medically necessary in this case because they are uniquely sized and shaped to fit the exact dimensions of the affected extremities with  deformities, and to provide accurate and consistent gradient compression and containment, essential to optimally managing this patient's symptoms of chronic, progressive lymphedema. Multiple custom compression garments are needed for optimal hygiene to limit infection risk. Custom compression garments should be replaced q 3-6 months When worn consistently for optimal lipo-lymphedema self-management over time.  ASSESSMENT: CLINICAL IMPRESSION:  Pt tolerated MLD with simultaneous skin care without increased pain today. Was able to move fluid congestion out of dorsal foot with deeper techniques. Fibrosis in distal leg and foot remains stubborn. Reapplied compression wraps at end of session. Cont as per POC.  OBJECTIVE IMPAIRMENTS: decreased knowledge of condition, decreased knowledge of use of DME, decreased mobility, increased edema, pain, and chronic lower extremity swelling and associated pain, lymphedema related skin changes ( decreased excursion, increased skin approximation) increased infection risk  ACTIVITY LIMITATIONS: sitting tolerance, standing tolerance, and functional ambulation. Basic and Instrumental ADLS ( fitting shoes, socks and lower body clothing, grooming nails, inspecting skin, performing skin care, driving, shopping, housework) leisure pursuits (walking for exercise), productive activities ( caring for others, work duties),   PARTICIPATION LIMITATIONS:  negative body image limits clothing selection and social participation  PERSONAL FACTORS: Time since onset of onset/ level of progression; exacerbating co morbidities: :  HTN, CVI   REHAB POTENTIAL: Good  EVALUATION COMPLEXITY: Moderate   GOALS: Goals reviewed with patient? Yes  SHORT TERM GOALS: Target date: 4th OT Rx visit  Pt will demonstrate understanding of lymphedema precautions and prevention strategies with modified independence using a printed reference to identify at least 5 precautions and discussing how s/he may  implement them into daily life to reduce risk of progression with extra time. Baseline:Max A Goal status: 05/07/23 GOAL MET  2.  Pt will be able to apply multilayer, thigh length, gradient, compression wraps to one leg at a time with modified assistance (extra time) to decrease limb volume, to limit infection risk, and to limit lymphedema progression.  Baseline: Dependent Goal status: 05/07/23 GOAL MET  LONG TERM GOALS: Target date: 06/12/23  Given this patient's Intake score of 81/100% on the functional outcomes FOTO tool, patient will experience an increase in function of 3 points to improve basic and instrumental ADLs performance, including lymphedema self-care.  Baseline: 81% Goal status: 05/07/23 PROGRESSING  2.  Given this patient's Intake score of 20.59 % on the Lymphedema Life Impact Scale (LLIS), patient will experience a reduction of at least 5 points in her perceived level of functional impairment resulting from lymphedema to improve functional performance and quality of life (QOL). Baseline: 20.59% Goal status: 05/07/23 GOAL MET  3.  During Intensive phase CDT Pt will achieve at least 85% compliance with all lymphedema self-care home program components, including daily skin care, compression wraps and /or garments, simple self MLD and lymphatic pumping therex to habituate LE self care protocol  into ADLs for optimal LE self-management over time. Baseline: Dependent Goal status:05/07/23 GOAL MET  4.  Pt will achieve at least a 10% volume reduction in the BLE to return limb to typical size and shape, to limit infection risk and LE progression, to decrease pain, to improve function. Baseline: Dependent Goal status: 05/07/23 PROGRESSING: L LEG reduction = 5.45% since commencing CDT on 03/21/23.  L THIGH is essentially unchanged with INCREASED volume measuring 0.86%. Full LLE volume reduction measures 1.93% to date.  5.  STG: Pt will obtain proper compression garments/devices and achieve  modified independence (extra time + assistive devices) with donning/doffing to optimize limb volume reductions and limit LE  progression over time. Baseline: Max A Goal status: 05/07/23 PROGRESSING  PLAN:  PT FREQUENCY: 2x/weekand PRN  PT DURATION: other: and PRN  PLANNED INTERVENTIONS: Complete Decongestive Therapy (CDT): Therapeutic lymphatic pumping exercises, skin care simultaneously w MLD to limit infection risk, Manual lymph drainage (MLD), During Intensive Phase- multilayer Compression bandaging; during self-management phase formulate appropriate compression garments and devices, complete anatomical measurements, fitting and assessment t of garments; Consider trial of Flexitouch ADVANCED sequential compression device as replacement for incorrect basic vaso pneumatic device. Patient/Family education, DME instructions,    PLAN FOR NEXT SESSION:  Cont Pt edu for simple self-MLD, wrapping, there ex Continue MLD to LLE/LLQ. Cont LLE knee length multilayer compression wrap   Loel Dubonnet, MS, OTR/L, CLT-LANA 05/16/23 10:01 AM

## 2023-05-16 NOTE — Therapy (Deleted)
OUTPATIENT OCCUPATIONAL THERAPY TREATMENT NOTE AND PROGRESS REPORT LOWER EXTREMITY LYMPHEDEMA  Patient Name: Kelsey Terry MRN: [id number] DOB:May 19, 1961, 62 y.o., female Today's Date: 04/24/23  REPORTING PERIOD: 03/14/23 - 05/07/23  END OF SESSION:   OT End of Session - 05/16/23 0918     Visit Number 12    Number of Visits 36    Date for OT Re-Evaluation 06/12/23    OT Start Time 0913    Activity Tolerance Patient tolerated treatment well;No increased pain    Behavior During Therapy [facility] for tasks assessed/performed               Past Medical History:  Diagnosis Date   Allergies    Asthma    Blood dyscrasia    blood clot in right leg   CAD (coronary artery disease) 05/05/2019   CAD in native artery    a. NSTEMI 05/16/18: LHC 05/16/18 - ost-pLAD 95% s/p PCI/DES, mLAD 30%, ostD1 70%, ost-pLCx 30%, mRCA 90%, dRCA 20%; b. staged PCI/DES to [facility] 7/1   Complication of anesthesia    patient stated she had "some kind of reaction to anesthesia, "thought she was having a stroke" during her second cath procedure.   Diabetes mellitus without complication (HCC)    pre-diabetes   Diastolic dysfunction    a. TTE 05/16/18: EF 55-60%, no RWMA, Gr1DD, trivial AI, calcified mitral annulus, mild MR, mildly dilated LA   Dyspnea    with activity   Hypertension    Lymphedema    LLE   Pericarditis    a. ~ 2012 in [place], [place] s/p pericardiocentesis    Wears contact lenses    Past Surgical History:  Procedure Laterality Date   ABDOMINAL HYSTERECTOMY     CESAREAN SECTION     COLONOSCOPY WITH PROPOFOL N/A 08/13/2019   Procedure: COLONOSCOPY WITH BIOPSIES;  Surgeon: Midge Minium, MD;  Location: [facility];  Service: Endoscopy;  Laterality: N/A;   CORONARY STENT INTERVENTION N/A 05/16/2018   Procedure: CORONARY STENT INTERVENTION;  Surgeon: Iran Ouch, MD;  Location: [facility] INVAS[facility] CV [facility];  Service: Cardiovascular;  Laterality: N/A;   CORONARY STENT INTERVENTION N/A 05/19/2018    Procedure: CORONARY STENT INTERVENTION;  Surgeon: Iran Ouch, MD;  Location: [facility] INVASIVE CV [facility];  Service: Cardiovascular;  Laterality: N/A;   LEFT HEART CATH AND CORONARY ANGIOGRAPHY N/A 05/16/2018   Procedure: LEFT HEART CATH AND CORONARY ANGIOGRAPHY;  Surgeon: Iran Ouch, MD;  Location: [facility] INVASIVE CV LAB;  Service: Cardiovascular;  Laterality: N/A;   PERICARDIOCENTESIS     POLYPECTOMY N/A 08/13/2019   Procedure: POLYPECTOMY;  Surgeon: Midge Minium, MD;  Location: [facility];  Service: Endoscopy;  Laterality: N/A;   Patient Active Problem List   Diagnosis Date Noted   Cough 03/31/2022   Hyperlipidemia 03/06/2021   Familial hypercholesterolemia 08/23/2020   Acute bilateral knee pain 01/13/2020   Health care maintenance 11/24/2019   H/O elevated lipids 11/04/2019   Lymphedema 08/26/2019   Chronic venous insufficiency 08/26/2019   Encounter for screening colonoscopy    Polyp of sigmoid colon    Polyp of ascending colon    Lymphedema of left leg 06/16/2019   Prediabetes 06/16/2019   Abnormal laboratory test result 06/16/2019   Prediabetes 05/20/2019   Essential hypertension 05/19/2019   Abscess of left buttock 05/05/2019   Essential hypertension 05/05/2019   CAD (coronary artery disease) 05/05/2019   NSTEMI (non-ST elevated myocardial infarction) (HCC) 05/16/2018    PCP: Duanne Limerick, MD  REFERRING  PROVIDER: Levora Dredge, MD  REFERRING DIAG: I89.0  THERAPY DIAG:  Lymphedema, not elsewhere classified  Rationale for Evaluation and Treatment: Rehabilitation  ONSET DATE: 03/14/23  SUBJECTIVE:                                                                                                                                                                                           SUBJECTIVE STATEMENT: Ms Snooks presents for OT visit to address BLE lymphedema, L>R. Pt denies LE-related leg pain. Pt states she feels like Rx is going well. Pt has no new  complaints  PERTINENT HISTORY: LE, CVI, HTN, Prediabetes, CAD, s/p NSTEMI 04/2028  PAIN: Are you having lymphedema -related  pain? No  PRECAUTIONS: Other: LYMPHEDEMA PRECAUTIONS: CARDIAC, DM- skin  FALLS: Has patient fallen since last visit? No  HAND DOMINANCE: right   PRIOR LEVEL OF FUNCTION: Independent  PATIENT GOALS: to be able to do something better to take care of my legs; to learn about any new management technology; to keep the lymphedema from getting worse."   OBJECTIVE:  OBSERVATIONS / OTHER ASSESSMENTS: Moderate, Stage  II, Primary, Bilateral Lower Extremity Lymphedema, L>R (Lymphedema Praecox)  FOTO functional outcome score: Intake 03/14/23: 81%  Lymphedema Life Impact Scale (LLIS) Intake 03/14/23: 20.59% (The extent to which lymphedema -related problems impacted your life in the past week)    BLE COMPARATIVE LIMB VOLUMETRICS Intake Visit 1 03/21/23  LANDMARK RIGHT    R LEG (A-D) 3627.8 ml  R THIGH (E-G) 6402.0 ml  R FULL LIMB (A-G) 10029.8 ml  Limb Volume differential (LVD)  %  Volume change since initial %  Volume change overall V  (Blank rows = not tested)  LANDMARK LEFT    L LEG (A-D) 4875.0 ml  L THIGH (E-G) 6129.6 ml  L  FULL LIMB (A-G) 11004.6 ml  Limb Volume differential (LVD)  Leg LVD = 25.6%, L>R Thigh LVD = 4.41%, R (dominant)>L Full Limb LVD = 8.85%, L>R%  Volume change since initial %  Volume change overall %  (Blank rows = not tested)   LLE COMPARATIVE LIMB VOLUMETRICS 9 th Rx Visit  05/03/23  LANDMARK LEFT    L LEG (A-D) 4609.5 ml  L THIGH (E-G) 6182.3 ml  L  FULL LIMB (A-G) 10791.9 ml  Limb Volume differential (LVD)  L LEG reduction = 5.45% since commencing CDT on 03/21/23.  L THIGH is essentially unchanged with INCREASED volume measuring 0.86%. Full LLE volume reduction measures 1.93% to date.  Volume change since initial %  Volume change overall %  (Blank rows = not tested)   TODAY'S TREATMENT:  LLE MLD Multilayer, knee  length,  LLE compression wrap                                                                                                                              PATIENT EDUCATION:  Continued Pt/ CG edu for lymphedema self care home program throughout session. Topics include outcome of comparative limb volumetrics- starting limb volume differentials (LVDs), technology and gradient techniques used for short stretch, multilayer compression wrapping, simple self-MLD, therapeutic lymphatic pumping exercises, skin/nail care, LE precautions,. compression garment recommendations and specifications, wear and care schedule and compression garment donning / doffing w assistive devices. Discussed progress towards all OT goals since commencing CDT. All questions answered to the Pt's satisfaction. Good return.  Person educated: Patient Education method: Explanation, Demonstration, and Handouts Education comprehension: verbalized understanding, returned demonstration, and needs further education  LE SELF-CARE HOME PROGRAM: BLE Lymphatic Pumping There ex Skin care to limit infection risk Compression Intensive stage compression: multilayer short stretch wraps with gradient techniques. One limb at a time. Self-management Phase Compression: Custom, flat knit, BLE compression knee highs. Consider Elvarex classic for daytime and Jobst Relax for HOS Simple self-MLD LLE/LLQ MLD utilizing short neck sequence, deep, diaphragmatic breathing, stationary J strokes to functional inguinal LN at L groin, then dynamic J strokes from proximal to distal from thigh, knee, leg and foot, with 3 sweeps to terminus to complete session.   Custom-made gradient compression garments and HOS devices are medically necessary in this case because they are uniquely sized and shaped to fit the exact dimensions of the affected extremities with deformities, and to provide accurate and consistent gradient compression and containment, essential to optimally managing  this patient's symptoms of chronic, progressive lymphedema. Multiple custom compression garments are needed for optimal hygiene to limit infection risk. Custom compression garments should be replaced q 3-6 months When worn consistently for optimal lipo-lymphedema self-management over time.  ASSESSMENT: CLINICAL IMPRESSION:  Pt tolerated MLD without increased pain today. Was able to move fluid congestion out of dorsal foot with deeper techniques. Fibrosis in distal leg and foot remains stubbornly in tact. Reapplied compression wraps at end of session. Cont as per POC.  OBJECTIVE IMPAIRMENTS: decreased knowledge of condition, decreased knowledge of use of DME, decreased mobility, increased edema, pain, and chronic lower extremity swelling and associated pain, lymphedema related skin changes ( decreased excursion, increased skin approximation) increased infection risk  ACTIVITY LIMITATIONS: sitting tolerance, standing tolerance, and functional ambulation. Basic and Instrumental ADLS ( fitting shoes, socks and lower body clothing, grooming nails, inspecting skin, performing skin care, driving, shopping, housework) leisure pursuits (walking for exercise), productive activities ( caring for others, work duties),   PARTICIPATION LIMITATIONS:  negative body image limits clothing selection and social participation  PERSONAL FACTORS: Time since onset of onset/ level of progression; exacerbating co morbidities: : HTN, CVI   REHAB POTENTIAL: Good  EVALUATION COMPLEXITY: Moderate   GOALS: Goals reviewed with patient? Yes  SHORT TERM GOALS: Target date: 4th  OT Rx visit  Pt will demonstrate understanding of lymphedema precautions and prevention strategies with modified independence using a printed reference to identify at least 5 precautions and discussing how s/he may implement them into daily life to reduce risk of progression with extra time. Baseline:Max A Goal status: 05/07/23 GOAL MET  2.  Pt will  be able to apply multilayer, thigh length, gradient, compression wraps to one leg at a time with modified assistance (extra time) to decrease limb volume, to limit infection risk, and to limit lymphedema progression.  Baseline: Dependent Goal status: 05/07/23 GOAL MET  LONG TERM GOALS: Target date: 06/12/23  Given this patient's Intake score of 81/100% on the functional outcomes FOTO tool, patient will experience an increase in function of 3 points to improve basic and instrumental ADLs performance, including lymphedema self-care.  Baseline: 81% Goal status: 05/07/23 PROGRESSING  2.  Given this patient's Intake score of 20.59 % on the Lymphedema Life Impact Scale (LLIS), patient will experience a reduction of at least 5 points in her perceived level of functional impairment resulting from lymphedema to improve functional performance and quality of life (QOL). Baseline: 20.59% Goal status: 05/07/23 GOAL MET  3.  During Intensive phase CDT Pt will achieve at least 85% compliance with all lymphedema self-care home program components, including daily skin care, compression wraps and /or garments, simple self MLD and lymphatic pumping therex to habituate LE self care protocol  into ADLs for optimal LE self-management over time. Baseline: Dependent Goal status:05/07/23 GOAL MET  4.  Pt will achieve at least a 10% volume reduction in the BLE to return limb to typical size and shape, to limit infection risk and LE progression, to decrease pain, to improve function. Baseline: Dependent Goal status: 05/07/23 PROGRESSING: L LEG reduction = 5.45% since commencing CDT on 03/21/23.  L THIGH is essentially unchanged with INCREASED volume measuring 0.86%. Full LLE volume reduction measures 1.93% to date.  5.  STG: Pt will obtain proper compression garments/devices and achieve modified independence (extra time + assistive devices) with donning/doffing to optimize limb volume reductions and limit LE  progression over  time. Baseline: Max A Goal status: 05/07/23 PROGRESSING  PLAN:  PT FREQUENCY: 2x/weekand PRN  PT DURATION: other: and PRN  PLANNED INTERVENTIONS: Complete Decongestive Therapy (CDT): Therapeutic lymphatic pumping exercises, skin care simultaneously w MLD to limit infection risk, Manual lymph drainage (MLD), During Intensive Phase- multilayer Compression bandaging; during self-management phase formulate appropriate compression garments and devices, complete anatomical measurements, fitting and assessment t of garments; Consider trial of Flexitouch ADVANCED sequential compression device as replacement for incorrect basic vaso pneumatic device. Patient/Family education, DME instructions,    PLAN FOR NEXT SESSION:  Cont Pt edu for simple self-MLD, wrapping, there ex Continue MLD to LLE/LLQ. Cont LLE knee length multilayer compression wrap   Loel Dubonnet, MS, OTR/L, CLT-LANA 05/16/23 9:20 AM

## 2023-05-21 ENCOUNTER — Ambulatory Visit: Payer: 59 | Attending: Vascular Surgery | Admitting: Occupational Therapy

## 2023-05-21 ENCOUNTER — Encounter: Payer: Self-pay | Admitting: Occupational Therapy

## 2023-05-21 DIAGNOSIS — I890 Lymphedema, not elsewhere classified: Secondary | ICD-10-CM | POA: Insufficient documentation

## 2023-05-21 NOTE — Therapy (Signed)
OUTPATIENT OCCUPATIONAL THERAPY TREATMENT NOTE LOWER EXTREMITY LYMPHEDEMA  Patient Name: Kelsey Terry MRN: [id number] DOB:April 25, 1961, 62 y.o., female Today's Date: 05/16/23    END OF SESSION:   OT End of Session - 05/21/23 0908     Visit Number 13    Number of Visits 36    Date for OT Re-Evaluation 06/12/23    OT Start Time 0904    OT Stop Time 1000    OT Time Calculation (min) 56 min    Activity Tolerance Patient tolerated treatment well;No increased pain    Behavior During Therapy [facility] for tasks assessed/performed               Past Medical History:  Diagnosis Date   Allergies    Asthma    Blood dyscrasia    blood clot in right leg   CAD (coronary artery disease) 05/05/2019   CAD in native artery    a. NSTEMI 05/16/18: LHC 05/16/18 - ost-pLAD 95% s/p PCI/DES, mLAD 30%, ostD1 70%, ost-pLCx 30%, mRCA 90%, dRCA 20%; b. staged PCI/DES to [facility] 7/1   Complication of anesthesia    patient stated she had "some kind of reaction to anesthesia, "thought she was having a stroke" during her second cath procedure.   Diabetes mellitus without complication (HCC)    pre-diabetes   Diastolic dysfunction    a. TTE 05/16/18: EF 55-60%, no RWMA, Gr1DD, trivial AI, calcified mitral annulus, mild MR, mildly dilated LA   Dyspnea    with activity   Hypertension    Lymphedema    LLE   Pericarditis    a. ~ 2012 in [place], [place] s/p pericardiocentesis    Wears contact lenses    Past Surgical History:  Procedure Laterality Date   ABDOMINAL HYSTERECTOMY     CESAREAN SECTION     COLONOSCOPY WITH PROPOFOL N/A 08/13/2019   Procedure: COLONOSCOPY WITH BIOPSIES;  Surgeon: Midge Minium, MD;  Location: [facility];  Service: Endoscopy;  Laterality: N/A;   CORONARY STENT INTERVENTION N/A 05/16/2018   Procedure: CORONARY STENT INTERVENTION;  Surgeon: Iran Ouch, MD;  Location: [facility];  Service: Cardiovascular;  Laterality: N/A;   CORONARY STENT INTERVENTION N/A 05/19/2018    Procedure: CORONARY STENT INTERVENTION;  Surgeon: Iran Ouch, MD;  Location: [facility];  Service: Cardiovascular;  Laterality: N/A;   LEFT HEART CATH AND CORONARY ANGIOGRAPHY N/A 05/16/2018   Procedure: LEFT HEART CATH AND CORONARY ANGIOGRAPHY;  Surgeon: Iran Ouch, MD;  Location: [facility] CV [facility];  Service: Cardiovascular;  Laterality: N/A;   PERICARDIOCENTESIS     POLYPECTOMY N/A 08/13/2019   Procedure: POLYPECTOMY;  Surgeon: Midge Minium, MD;  Location: [facility];  Service: Endoscopy;  Laterality: N/A;   Patient Active Problem List   Diagnosis Date Noted   Cough 03/31/2022   Hyperlipidemia 03/06/2021   Familial hypercholesterolemia 08/23/2020   Acute bilateral knee pain 01/13/2020   Health care maintenance 11/24/2019   H/O elevated lipids 11/04/2019   Lymphedema 08/26/2019   Chronic venous insufficiency 08/26/2019   Encounter for screening colonoscopy    Polyp of sigmoid colon    Polyp of ascending colon    Lymphedema of left leg 06/16/2019   Prediabetes 06/16/2019   Abnormal laboratory test result 06/16/2019   Prediabetes 05/20/2019   Essential hypertension 05/19/2019   Abscess of left buttock 05/05/2019   Essential hypertension 05/05/2019   CAD (coronary artery disease) 05/05/2019   NSTEMI (non-ST elevated myocardial infarction) (HCC) 05/16/2018  PCP: Duanne Limerick, MD  REFERRING PROVIDER: Levora Dredge, MD  REFERRING DIAG: I89.0  THERAPY DIAG:  Lymphedema, not elsewhere classified  Rationale for Evaluation and Treatment: Rehabilitation  ONSET DATE: 03/14/23  SUBJECTIVE:                                                                                                                                                                                           SUBJECTIVE STATEMENT: Kelsey Terry presents for OT visit to address BLE lymphedema, L>R. Pt presents on time with clean wraps. Pt denies LE-related leg pain. Pt states she feels  like Rx is going well. Pt has no new complaints. She denies LE related leg pain.  PERTINENT HISTORY: LE, CVI, HTN, Prediabetes, CAD, s/p NSTEMI 04/2028  PAIN: Are you having lymphedema -related  pain? No  PRECAUTIONS: Other: LYMPHEDEMA PRECAUTIONS: CARDIAC, DM- skin  FALLS: Has patient fallen since last visit? No  HAND DOMINANCE: right   PRIOR LEVEL OF FUNCTION: Independent  PATIENT GOALS: to be able to do something better to take care of my legs; to learn about any new management technology; to keep the lymphedema from getting worse."   OBJECTIVE:  OBSERVATIONS / OTHER ASSESSMENTS: Moderate, Stage  II, Primary, Bilateral Lower Extremity Lymphedema, L>R (Lymphedema Praecox)  FOTO functional outcome score: Intake 03/14/23: 81%  Lymphedema Life Impact Scale (LLIS) Intake 03/14/23: 20.59% (The extent to which lymphedema -related problems impacted your life in the past week)    BLE COMPARATIVE LIMB VOLUMETRICS Intake Visit 1 03/21/23  LANDMARK RIGHT    R LEG (A-D) 3627.8 ml  R THIGH (E-G) 6402.0 ml  R FULL LIMB (A-G) 10029.8 ml  Limb Volume differential (LVD)  %  Volume change since initial %  Volume change overall V  (Blank rows = not tested)  LANDMARK LEFT    L LEG (A-D) 4875.0 ml  L THIGH (E-G) 6129.6 ml  L  FULL LIMB (A-G) 11004.6 ml  Limb Volume differential (LVD)  Leg LVD = 25.6%, L>R Thigh LVD = 4.41%, R (dominant)>L Full Limb LVD = 8.85%, L>R%  Volume change since initial %  Volume change overall %  (Blank rows = not tested)   LLE COMPARATIVE LIMB VOLUMETRICS 9 th Rx Visit  05/03/23  LANDMARK LEFT    L LEG (A-D) 4609.5 ml  L THIGH (E-G) 6182.3 ml  L  FULL LIMB (A-G) 10791.9 ml  Limb Volume differential (LVD)  L LEG reduction = 5.45% since commencing CDT on 03/21/23.  L THIGH is essentially unchanged with INCREASED volume measuring 0.86%. Full LLE volume reduction measures 1.93% to date.  Volume change since initial %  Volume change overall %  (Blank rows =  not tested)   TODAY'S TREATMENT:  LLE MLD Multilayer, knee length, LLE compression wrap                                                                                                                              PATIENT EDUCATION:  Continued Pt/ CG edu for lymphedema self care home program throughout session. Topics include outcome of comparative limb volumetrics- starting limb volume differentials (LVDs), technology and gradient techniques used for short stretch, multilayer compression wrapping, simple self-MLD, therapeutic lymphatic pumping exercises, skin/nail care, LE precautions,. compression garment recommendations and specifications, wear and care schedule and compression garment donning / doffing w assistive devices. Discussed progress towards all OT goals since commencing CDT. All questions answered to the Pt's satisfaction. Good return.  Person educated: Patient Education method: Explanation, Demonstration, and Handouts Education comprehension: verbalized understanding, returned demonstration, and needs further education  LE SELF-CARE HOME PROGRAM: BLE Lymphatic Pumping There ex Skin care to limit infection risk Compression Intensive stage compression: multilayer short stretch wraps with gradient techniques. One limb at a time. Self-management Phase Compression: Custom, flat knit, BLE compression knee highs. Consider Elvarex classic for daytime and Jobst Relax for HOS Simple self-MLD LLE/LLQ MLD utilizing short neck sequence, deep, diaphragmatic breathing, stationary J strokes to functional inguinal LN at L groin, then dynamic J strokes from proximal to distal from thigh, knee, leg and foot, with 3 sweeps to terminus to complete session.   Custom-made gradient compression garments and HOS devices are medically necessary in this case because they are uniquely sized and shaped to fit the exact dimensions of the affected extremities with deformities, and to provide accurate and consistent  gradient compression and containment, essential to optimally managing this patient's symptoms of chronic, progressive lymphedema. Multiple custom compression garments are needed for optimal hygiene to limit infection risk. Custom compression garments should be replaced q 3-6 months When worn consistently for optimal lipo-lymphedema self-management over time.  ASSESSMENT: CLINICAL IMPRESSION:  Pt educated re compression garment options and recommendations, measurement and process for accessing insurance coverage through a DME vendor. Also answered several clinical questions re nature of LE. Pt tolerated MLD with simultaneous skin care without increased pain today. Was able to move fluid congestion out of dorsal foot with deeper techniques. Fibrosis in distal leg and foot remains stubborn. Reapplied compression wraps at end of session. LLE not yet ready for garment measurements. Cont CDT until reduction appears to plateau. Cont as per POC.  OBJECTIVE IMPAIRMENTS: decreased knowledge of condition, decreased knowledge of use of DME, decreased mobility, increased edema, pain, and chronic lower extremity swelling and associated pain, lymphedema related skin changes ( decreased excursion, increased skin approximation) increased infection risk  ACTIVITY LIMITATIONS: sitting tolerance, standing tolerance, and functional ambulation. Basic and Instrumental ADLS ( fitting shoes, socks and lower body clothing, grooming nails, inspecting skin, performing skin care, driving, shopping, housework) leisure pursuits (walking for exercise),  productive activities ( caring for others, work duties),   PARTICIPATION LIMITATIONS:  negative body image limits clothing selection and social participation  PERSONAL FACTORS: Time since onset of onset/ level of progression; exacerbating co morbidities: : HTN, CVI   REHAB POTENTIAL: Good  EVALUATION COMPLEXITY: Moderate   GOALS: Goals reviewed with patient? Yes  SHORT TERM  GOALS: Target date: 4th OT Rx visit  Pt will demonstrate understanding of lymphedema precautions and prevention strategies with modified independence using a printed reference to identify at least 5 precautions and discussing how s/he may implement them into daily life to reduce risk of progression with extra time. Baseline:Max A Goal status: 05/07/23 GOAL MET  2.  Pt will be able to apply multilayer, thigh length, gradient, compression wraps to one leg at a time with modified assistance (extra time) to decrease limb volume, to limit infection risk, and to limit lymphedema progression.  Baseline: Dependent Goal status: 05/07/23 GOAL MET  LONG TERM GOALS: Target date: 06/12/23  Given this patient's Intake score of 81/100% on the functional outcomes FOTO tool, patient will experience an increase in function of 3 points to improve basic and instrumental ADLs performance, including lymphedema self-care.  Baseline: 81% Goal status: 05/07/23 PROGRESSING  2.  Given this patient's Intake score of 20.59 % on the Lymphedema Life Impact Scale (LLIS), patient will experience a reduction of at least 5 points in her perceived level of functional impairment resulting from lymphedema to improve functional performance and quality of life (QOL). Baseline: 20.59% Goal status: 05/07/23 GOAL MET  3.  During Intensive phase CDT Pt will achieve at least 85% compliance with all lymphedema self-care home program components, including daily skin care, compression wraps and /or garments, simple self MLD and lymphatic pumping therex to habituate LE self care protocol  into ADLs for optimal LE self-management over time. Baseline: Dependent Goal status:05/07/23 GOAL MET  4.  Pt will achieve at least a 10% volume reduction in the BLE to return limb to typical size and shape, to limit infection risk and LE progression, to decrease pain, to improve function. Baseline: Dependent Goal status: 05/07/23 PROGRESSING: L LEG reduction =  5.45% since commencing CDT on 03/21/23.  L THIGH is essentially unchanged with INCREASED volume measuring 0.86%. Full LLE volume reduction measures 1.93% to date.  5.  STG: Pt will obtain proper compression garments/devices and achieve modified independence (extra time + assistive devices) with donning/doffing to optimize limb volume reductions and limit LE  progression over time. Baseline: Max A Goal status: 05/07/23 PROGRESSING  PLAN:  PT FREQUENCY: 2x/weekand PRN  PT DURATION: other: and PRN  PLANNED INTERVENTIONS: Complete Decongestive Therapy (CDT): Therapeutic lymphatic pumping exercises, skin care simultaneously w MLD to limit infection risk, Manual lymph drainage (MLD), During Intensive Phase- multilayer Compression bandaging; during self-management phase formulate appropriate compression garments and devices, complete anatomical measurements, fitting and assessment t of garments; Consider trial of Flexitouch ADVANCED sequential compression device as replacement for incorrect basic vaso pneumatic device. Patient/Family education, DME instructions,    PLAN FOR NEXT SESSION:  Cont Pt edu for simple self-MLD, wrapping, there ex Continue MLD to LLE/LLQ. Cont LLE knee length multilayer compression wrap   Loel Dubonnet, Kelsey, OTR/L, CLT-LANA 05/21/23 12:28 PM

## 2023-05-28 ENCOUNTER — Ambulatory Visit: Payer: 59 | Admitting: Occupational Therapy

## 2023-05-28 ENCOUNTER — Encounter: Payer: Self-pay | Admitting: Occupational Therapy

## 2023-05-28 DIAGNOSIS — I890 Lymphedema, not elsewhere classified: Secondary | ICD-10-CM | POA: Diagnosis not present

## 2023-05-28 NOTE — Therapy (Signed)
OUTPATIENT OCCUPATIONAL THERAPY TREATMENT NOTE LOWER EXTREMITY LYMPHEDEMA  Patient Name: Kelsey Terry MRN: [id number] DOB:1961-04-28, 62 y.o., female Today's Date: 05/16/23    END OF SESSION:   OT End of Session - 05/28/23 0911     Visit Number 14    Number of Visits 36    Date for OT Re-Evaluation 06/12/23    OT Start Time 0900    OT Stop Time 1000    OT Time Calculation (min) 60 min    Activity Tolerance Patient tolerated treatment well;No increased pain    Behavior During Therapy [facility] for tasks assessed/performed               Past Medical History:  Diagnosis Date   Allergies    Asthma    Blood dyscrasia    blood clot in right leg   CAD (coronary artery disease) 05/05/2019   CAD in native artery    a. NSTEMI 05/16/18: LHC 05/16/18 - ost-pLAD 95% s/p PCI/DES, mLAD 30%, ostD1 70%, ost-pLCx 30%, mRCA 90%, dRCA 20%; b. staged PCI/DES to [facility] 7/1   Complication of anesthesia    patient stated she had "some kind of reaction to anesthesia, "thought she was having a stroke" during her second cath procedure.   Diabetes mellitus without complication (HCC)    pre-diabetes   Diastolic dysfunction    a. TTE 05/16/18: EF 55-60%, no RWMA, Gr1DD, trivial AI, calcified mitral annulus, mild MR, mildly dilated LA   Dyspnea    with activity   Hypertension    Lymphedema    LLE   Pericarditis    a. ~ 2012 in [place], [place] s/p pericardiocentesis    Wears contact lenses    Past Surgical History:  Procedure Laterality Date   ABDOMINAL HYSTERECTOMY     CESAREAN SECTION     COLONOSCOPY WITH PROPOFOL N/A 08/13/2019   Procedure: COLONOSCOPY WITH BIOPSIES;  Surgeon: Midge Minium, MD;  Location: [facility];  Service: Endoscopy;  Laterality: N/A;   CORONARY STENT INTERVENTION N/A 05/16/2018   Procedure: CORONARY STENT INTERVENTION;  Surgeon: Iran Ouch, MD;  Location: [facility] INVASIVE CV LAB;  Service: Cardiovascular;  Laterality: N/A;   CORONARY STENT INTERVENTION N/A 05/19/2018    Procedure: CORONARY STENT INTERVENTION;  Surgeon: Iran Ouch, MD;  Location: [facility] INVASIVE CV LAB;  Service: Cardiovascular;  Laterality: N/A;   LEFT HEART CATH AND CORONARY ANGIOGRAPHY N/A 05/16/2018   Procedure: LEFT HEART CATH AND CORONARY ANGIOGRAPHY;  Surgeon: Iran Ouch, MD;  Location: [facility] INVASIVE CV LAB;  Service: Cardiovascular;  Laterality: N/A;   PERICARDIOCENTESIS     POLYPECTOMY N/A 08/13/2019   Procedure: POLYPECTOMY;  Surgeon: Midge Minium, MD;  Location: [facility];  Service: Endoscopy;  Laterality: N/A;   Patient Active Problem List   Diagnosis Date Noted   Cough 03/31/2022   Hyperlipidemia 03/06/2021   Familial hypercholesterolemia 08/23/2020   Acute bilateral knee pain 01/13/2020   Health care maintenance 11/24/2019   H/O elevated lipids 11/04/2019   Lymphedema 08/26/2019   Chronic venous insufficiency 08/26/2019   Encounter for screening colonoscopy    Polyp of sigmoid colon    Polyp of ascending colon    Lymphedema of left leg 06/16/2019   Prediabetes 06/16/2019   Abnormal laboratory test result 06/16/2019   Prediabetes 05/20/2019   Essential hypertension 05/19/2019   Abscess of left buttock 05/05/2019   Essential hypertension 05/05/2019   CAD (coronary artery disease) 05/05/2019   NSTEMI (non-ST elevated myocardial infarction) (HCC) 05/16/2018  PCP: Duanne Limerick, MD  REFERRING PROVIDER: Levora Dredge, MD  REFERRING DIAG: I89.0  THERAPY DIAG:  Lymphedema, not elsewhere classified  Rationale for Evaluation and Treatment: Rehabilitation  ONSET DATE: 03/14/23  SUBJECTIVE:                                                                                                                                                                                           SUBJECTIVE STATEMENT: Kelsey Terry presents for OT visit to address BLE lymphedema, L>R. Pt presents on time with clean wraps. Pt denies LE-related leg pain. Pt states she feels  like Rx is going well. Pt has no new complaints. She denies LE related leg pain.  PERTINENT HISTORY: LE, CVI, HTN, Prediabetes, CAD, s/p NSTEMI 04/2028  PAIN: Are you having lymphedema -related  pain? No  PRECAUTIONS: Other: LYMPHEDEMA PRECAUTIONS: CARDIAC, DM- skin  FALLS: Has patient fallen since last visit? No  HAND DOMINANCE: right   PRIOR LEVEL OF FUNCTION: Independent  PATIENT GOALS: to be able to do something better to take care of my legs; to learn about any new management technology; to keep the lymphedema from getting worse."   OBJECTIVE:  OBSERVATIONS / OTHER ASSESSMENTS: Moderate, Stage  II, Primary, Bilateral Lower Extremity Lymphedema, L>R (Lymphedema Praecox)  FOTO functional outcome score: Intake 03/14/23: 81%  Lymphedema Life Impact Scale (LLIS) Intake 03/14/23: 20.59% (The extent to which lymphedema -related problems impacted your life in the past week)    BLE COMPARATIVE LIMB VOLUMETRICS Intake Visit 1 03/21/23  LANDMARK RIGHT    R LEG (A-D) 3627.8 ml  R THIGH (E-G) 6402.0 ml  R FULL LIMB (A-G) 10029.8 ml  Limb Volume differential (LVD)  %  Volume change since initial %  Volume change overall V  (Blank rows = not tested)  LANDMARK LEFT    L LEG (A-D) 4875.0 ml  L THIGH (E-G) 6129.6 ml  L  FULL LIMB (A-G) 11004.6 ml  Limb Volume differential (LVD)  Leg LVD = 25.6%, L>R Thigh LVD = 4.41%, R (dominant)>L Full Limb LVD = 8.85%, L>R%  Volume change since initial %  Volume change overall %  (Blank rows = not tested)   LLE COMPARATIVE LIMB VOLUMETRICS 9 th Rx Visit  05/03/23  LANDMARK LEFT    L LEG (A-D) 4609.5 ml  L THIGH (E-G) 6182.3 ml  L  FULL LIMB (A-G) 10791.9 ml  Limb Volume differential (LVD)  L LEG reduction = 5.45% since commencing CDT on 03/21/23.  L THIGH is essentially unchanged with INCREASED volume measuring 0.86%. Full LLE volume reduction measures 1.93% to date.  Volume change since initial %  Volume change overall %  (Blank rows =  not tested)   TODAY'S TREATMENT:  LLE MLD Multilayer, knee length, LLE compression wrap                                                                                                                              PATIENT EDUCATION:  Continued Pt/ CG edu for lymphedema self care home program throughout session. Topics include outcome of comparative limb volumetrics- starting limb volume differentials (LVDs), technology and gradient techniques used for short stretch, multilayer compression wrapping, simple self-MLD, therapeutic lymphatic pumping exercises, skin/nail care, LE precautions,. compression garment recommendations and specifications, wear and care schedule and compression garment donning / doffing w assistive devices. Discussed progress towards all OT goals since commencing CDT. All questions answered to the Pt's satisfaction. Good return.  Person educated: Patient Education method: Explanation, Demonstration, and Handouts Education comprehension: verbalized understanding, returned demonstration, and needs further education  LE SELF-CARE HOME PROGRAM: BLE Lymphatic Pumping There ex Skin care to limit infection risk Compression Intensive stage compression: multilayer short stretch wraps with gradient techniques. One limb at a time. Self-management Phase Compression: Custom, flat knit, BLE compression knee highs. Consider Elvarex classic for daytime and Jobst Relax for HOS Simple self-MLD LLE/LLQ MLD utilizing short neck sequence, deep, diaphragmatic breathing, stationary J strokes to functional inguinal LN at L groin, then dynamic J strokes from proximal to distal from thigh, knee, leg and foot, with 3 sweeps to terminus to complete session.   Custom-made gradient compression garments and HOS devices are medically necessary in this case because they are uniquely sized and shaped to fit the exact dimensions of the affected extremities with deformities, and to provide accurate and consistent  gradient compression and containment, essential to optimally managing this patient's symptoms of chronic, progressive lymphedema. Multiple custom compression garments are needed for optimal hygiene to limit infection risk. Custom compression garments should be replaced q 3-6 months When worn consistently for optimal lipo-lymphedema self-management over time.  ASSESSMENT: CLINICAL IMPRESSION:  LLE swelling and tissue density is obviously increased today by palpation and visual assessment. Peau d'orange skin is also obvious with distended pores and shiny skin surface. Pt reports she removed compression wraps due to very hot summer temps. We discussed the need to keep compression in place as much as possible since hot weather results in increased lymphedema symptoms, especially when uncompressed. Pt tolerated LLE / LLQ MLD with simultaneous skin care without increased pain today. Was able to move fluid congestion out of dorsal foot with deeper techniques. Fibrosis in distal leg and foot remains stubborn. Reapplied compression wraps at end of session. LLE not yet ready for garment measurements. Cont CDT until reduction appears to plateau. Cont as per POC.  OBJECTIVE IMPAIRMENTS: decreased knowledge of condition, decreased knowledge of use of DME, decreased mobility, increased edema, pain, and chronic lower extremity swelling and associated pain, lymphedema related skin changes ( decreased excursion, increased skin approximation) increased infection  risk  ACTIVITY LIMITATIONS: sitting tolerance, standing tolerance, and functional ambulation. Basic and Instrumental ADLS ( fitting shoes, socks and lower body clothing, grooming nails, inspecting skin, performing skin care, driving, shopping, housework) leisure pursuits (walking for exercise), productive activities ( caring for others, work duties),   PARTICIPATION LIMITATIONS:  negative body image limits clothing selection and social participation  PERSONAL  FACTORS: Time since onset of onset/ level of progression; exacerbating co morbidities: : HTN, CVI   REHAB POTENTIAL: Good  EVALUATION COMPLEXITY: Moderate   GOALS: Goals reviewed with patient? Yes  SHORT TERM GOALS: Target date: 4th OT Rx visit  Pt will demonstrate understanding of lymphedema precautions and prevention strategies with modified independence using a printed reference to identify at least 5 precautions and discussing how s/he may implement them into daily life to reduce risk of progression with extra time. Baseline:Max A Goal status: 05/07/23 GOAL MET  2.  Pt will be able to apply multilayer, thigh length, gradient, compression wraps to one leg at a time with modified assistance (extra time) to decrease limb volume, to limit infection risk, and to limit lymphedema progression.  Baseline: Dependent Goal status: 05/07/23 GOAL MET  LONG TERM GOALS: Target date: 06/12/23  Given this patient's Intake score of 81/100% on the functional outcomes FOTO tool, patient will experience an increase in function of 3 points to improve basic and instrumental ADLs performance, including lymphedema self-care.  Baseline: 81% Goal status: 05/07/23 PROGRESSING  2.  Given this patient's Intake score of 20.59 % on the Lymphedema Life Impact Scale (LLIS), patient will experience a reduction of at least 5 points in her perceived level of functional impairment resulting from lymphedema to improve functional performance and quality of life (QOL). Baseline: 20.59% Goal status: 05/07/23 GOAL MET  3.  During Intensive phase CDT Pt will achieve at least 85% compliance with all lymphedema self-care home program components, including daily skin care, compression wraps and /or garments, simple self MLD and lymphatic pumping therex to habituate LE self care protocol  into ADLs for optimal LE self-management over time. Baseline: Dependent Goal status:05/07/23 GOAL MET  4.  Pt will achieve at least a 10% volume  reduction in the BLE to return limb to typical size and shape, to limit infection risk and LE progression, to decrease pain, to improve function. Baseline: Dependent Goal status: 05/07/23 PROGRESSING: L LEG reduction = 5.45% since commencing CDT on 03/21/23.  L THIGH is essentially unchanged with INCREASED volume measuring 0.86%. Full LLE volume reduction measures 1.93% to date.  5.  STG: Pt will obtain proper compression garments/devices and achieve modified independence (extra time + assistive devices) with donning/doffing to optimize limb volume reductions and limit LE  progression over time. Baseline: Max A Goal status: 05/07/23 PROGRESSING  PLAN:  PT FREQUENCY: 2x/weekand PRN  PT DURATION: other: and PRN  PLANNED INTERVENTIONS: Complete Decongestive Therapy (CDT): Therapeutic lymphatic pumping exercises, skin care simultaneously w MLD to limit infection risk, Manual lymph drainage (MLD), During Intensive Phase- multilayer Compression bandaging; during self-management phase formulate appropriate compression garments and devices, complete anatomical measurements, fitting and assessment t of garments; Consider trial of Flexitouch ADVANCED sequential compression device as replacement for incorrect basic vaso pneumatic device. Patient/Family education, DME instructions,    PLAN FOR NEXT SESSION:  Cont Pt edu for simple self-MLD, wrapping, there ex Continue MLD to LLE/LLQ. Cont LLE knee length multilayer compression wrap   Loel Dubonnet, Kelsey, OTR/L, CLT-LANA 05/28/23 9:57 AM

## 2023-05-30 ENCOUNTER — Encounter: Payer: Self-pay | Admitting: Occupational Therapy

## 2023-05-30 ENCOUNTER — Ambulatory Visit: Payer: 59 | Admitting: Occupational Therapy

## 2023-05-30 DIAGNOSIS — I890 Lymphedema, not elsewhere classified: Secondary | ICD-10-CM

## 2023-05-30 NOTE — Therapy (Signed)
OUTPATIENT OCCUPATIONAL THERAPY TREATMENT NOTE LOWER EXTREMITY LYMPHEDEMA  Patient Name: Kelsey Terry MRN: [id number] DOB:[birth year], 62 y.o., female Today's Date: 05/16/23    END OF SESSION:   OT End of Session - 05/30/23 0907     Visit Number 15    Number of Visits 36    Date for OT Re-Evaluation 06/12/23    OT Start Time 0900    OT Stop Time 1000    OT Time Calculation (min) 60 min    Activity Tolerance Patient tolerated treatment well;No increased pain    Behavior During Therapy [facility] for tasks assessed/performed               Past Medical History:  Diagnosis Date   Allergies    Asthma    Blood dyscrasia    blood clot in right leg   CAD (coronary artery disease) 05/05/2019   CAD in native artery    a. NSTEMI 05/16/18: LHC 05/16/18 - ost-pLAD 95% s/p PCI/DES, mLAD 30%, ostD1 70%, ost-pLCx 30%, mRCA 90%, dRCA 20%; b. staged PCI/DES to [facility] 7/1   Complication of anesthesia    patient stated she had "some kind of reaction to anesthesia, "thought she was having a stroke" during her second cath procedure.   Diabetes mellitus without complication (HCC)    pre-diabetes   Diastolic dysfunction    a. TTE 05/16/18: EF 55-60%, no RWMA, Gr1DD, trivial AI, calcified mitral annulus, mild MR, mildly dilated LA   Dyspnea    with activity   Hypertension    Lymphedema    LLE   Pericarditis    a. ~ 2012 in [place], [place] s/p pericardiocentesis    Wears contact lenses    Past Surgical History:  Procedure Laterality Date   ABDOMINAL HYSTERECTOMY     CESAREAN SECTION     COLONOSCOPY WITH PROPOFOL N/A 08/13/2019   Procedure: COLONOSCOPY WITH BIOPSIES;  Surgeon: Midge Minium, MD;  [facility];  Service: Endoscopy;  Laterality: N/A;   CORONARY STENT INTERVENTION N/A 05/16/2018   Procedure: CORONARY STENT INTERVENTION;  Surgeon: Iran Ouch, MD;  Location: [facility];  Service: Cardiovascular;  Laterality: N/A;   CORONARY STENT INTERVENTION N/A 05/19/2018    Procedure: CORONARY STENT INTERVENTION;  Surgeon: Iran Ouch, MD;  Location: [facility];  Service: Cardiovascular;  Laterality: N/A;   LEFT HEART CATH AND CORONARY ANGIOGRAPHY N/A 05/16/2018   Procedure: LEFT HEART CATH AND CORONARY ANGIOGRAPHY;  Surgeon: Iran Ouch, MD;  Location: [facility];  Service: Cardiovascular;  Laterality: N/A;   PERICARDIOCENTESIS     POLYPECTOMY N/A 08/13/2019   Procedure: POLYPECTOMY;  Surgeon: Midge Minium, MD;  Location: [facility];  Service: Endoscopy;  Laterality: N/A;   Patient Active Problem List   Diagnosis Date Noted   Cough 03/31/2022   Hyperlipidemia 03/06/2021   Familial hypercholesterolemia 08/23/2020   Acute bilateral knee pain 01/13/2020   Health care maintenance 11/24/2019   H/O elevated lipids 11/04/2019   Lymphedema 08/26/2019   Chronic venous insufficiency 08/26/2019   Encounter for screening colonoscopy    Polyp of sigmoid colon    Polyp of ascending colon    Lymphedema of left leg 06/16/2019   Prediabetes 06/16/2019   Abnormal laboratory test result 06/16/2019   Prediabetes 05/20/2019   Essential hypertension 05/19/2019   Abscess of left buttock 05/05/2019   Essential hypertension 05/05/2019   CAD (coronary artery disease) 05/05/2019   NSTEMI (non-ST elevated myocardial infarction) (HCC) 05/16/2018  PCP: Duanne Limerick, MD  REFERRING PROVIDER: Levora Dredge, MD  REFERRING DIAG: I89.0  THERAPY DIAG:  Lymphedema, not elsewhere classified  Rationale for Evaluation and Treatment: Rehabilitation  ONSET DATE: 03/14/23  SUBJECTIVE:                                                                                                                                                                                           SUBJECTIVE STATEMENT: Kelsey Terry presents for OT visit to address BLE lymphedema, L>R. Pt presents on time with clean wraps. Pt denies LE-related leg pain and has no new  complaints.  PERTINENT HISTORY: LE, CVI, HTN, Prediabetes, CAD, s/p NSTEMI 04/2028  PAIN: Are you having lymphedema -related  pain? No  PRECAUTIONS: Other: LYMPHEDEMA PRECAUTIONS: CARDIAC, DM- skin  FALLS: Has patient fallen since last visit? No  HAND DOMINANCE: right   PRIOR LEVEL OF FUNCTION: Independent  PATIENT GOALS: to be able to do something better to take care of my legs; to learn about any new management technology; to keep the lymphedema from getting worse."   OBJECTIVE:  OBSERVATIONS / OTHER ASSESSMENTS: Moderate, Stage  II, Primary, Bilateral Lower Extremity Lymphedema, L>R (Lymphedema Praecox)  FOTO functional outcome score: Intake 03/14/23: 81%  Lymphedema Life Impact Scale (LLIS) Intake 03/14/23: 20.59% (The extent to which lymphedema -related problems impacted your life in the past week)    BLE COMPARATIVE LIMB VOLUMETRICS Intake Visit 1 03/21/23  LANDMARK RIGHT    R LEG (A-D) 3627.8 ml  R THIGH (E-G) 6402.0 ml  R FULL LIMB (A-G) 10029.8 ml  Limb Volume differential (LVD)  %  Volume change since initial %  Volume change overall V  (Blank rows = not tested)  LANDMARK LEFT    L LEG (A-D) 4875.0 ml  L THIGH (E-G) 6129.6 ml  L  FULL LIMB (A-G) 11004.6 ml  Limb Volume differential (LVD)  Leg LVD = 25.6%, L>R Thigh LVD = 4.41%, R (dominant)>L Full Limb LVD = 8.85%, L>R%  Volume change since initial %  Volume change overall %  (Blank rows = not tested)   LLE COMPARATIVE LIMB VOLUMETRICS 9 th Rx Visit  05/03/23  LANDMARK LEFT    L LEG (A-D) 4609.5 ml  L THIGH (E-G) 6182.3 ml  L  FULL LIMB (A-G) 10791.9 ml  Limb Volume differential (LVD)  L LEG reduction = 5.45% since commencing CDT on 03/21/23.  L THIGH is essentially unchanged with INCREASED volume measuring 0.86%. Full LLE volume reduction measures 1.93% to date.  Volume change since initial %  Volume change overall %  (Blank rows = not tested)   TODAY'S TREATMENT:  LLE MLD Multilayer, knee length,  LLE compression wrap                                                                                                                              PATIENT EDUCATION:  Continued Pt/ CG edu for lymphedema self care home program throughout session. Topics include outcome of comparative limb volumetrics- starting limb volume differentials (LVDs), technology and gradient techniques used for short stretch, multilayer compression wrapping, simple self-MLD, therapeutic lymphatic pumping exercises, skin/nail care, LE precautions,. compression garment recommendations and specifications, wear and care schedule and compression garment donning / doffing w assistive devices. Discussed progress towards all OT goals since commencing CDT. All questions answered to the Pt's satisfaction. Good return.  Person educated: Patient Education method: Explanation, Demonstration, and Handouts Education comprehension: verbalized understanding, returned demonstration, and needs further education  LE SELF-CARE HOME PROGRAM: BLE Lymphatic Pumping There ex Skin care to limit infection risk Compression Intensive stage compression: multilayer short stretch wraps with gradient techniques. One limb at a time. Self-management Phase Compression: Custom, flat knit, BLE compression knee highs. Consider Elvarex classic for daytime and Jobst Relax for HOS Simple self-MLD LLE/LLQ MLD utilizing short neck sequence, deep, diaphragmatic breathing, stationary J strokes to functional inguinal LN at L groin, then dynamic J strokes from proximal to distal from thigh, knee, leg and foot, with 3 sweeps to terminus to complete session.   Custom-made gradient compression garments and HOS devices are medically necessary in this case because they are uniquely sized and shaped to fit the exact dimensions of the affected extremities with deformities, and to provide accurate and consistent gradient compression and containment, essential to optimally managing  this patient's symptoms of chronic, progressive lymphedema. Multiple custom compression garments are needed for optimal hygiene to limit infection risk. Custom compression garments should be replaced q 3-6 months When worn consistently for optimal lipo-lymphedema self-management over time.  ASSESSMENT: CLINICAL IMPRESSION:  LLE swelling and tissue density increased at last visit is largely unchanged today.  Peau d'orange texture and shiny skin surface persist. Pt tolerated LLE / LLQ MLD with simultaneous skin care without increased pain today. Was able to move fluid congestion out of dorsal foot with deeper techniques. Fibrosis in distal leg and foot remains stubborn. Reapplied compression wraps at end of session adding custom fabricated chip pad to dorsal foot. Pt declined Comprex foam kidney to swollen fat pad inferior to lateral malleolus. Marland Kitchen LLE not yet ready for garment measurements. Cont CDT until reduction appears to plateau. Cont as per POC.  OBJECTIVE IMPAIRMENTS: decreased knowledge of condition, decreased knowledge of use of DME, decreased mobility, increased edema, pain, and chronic lower extremity swelling and associated pain, lymphedema related skin changes ( decreased excursion, increased skin approximation) increased infection risk  ACTIVITY LIMITATIONS: sitting tolerance, standing tolerance, and functional ambulation. Basic and Instrumental ADLS ( fitting shoes, socks and lower body clothing, grooming nails, inspecting skin, performing skin care, driving, shopping, housework) leisure  pursuits (walking for exercise), productive activities ( caring for others, work duties),   PARTICIPATION LIMITATIONS:  negative body image limits clothing selection and social participation  PERSONAL FACTORS: Time since onset of onset/ level of progression; exacerbating co morbidities: : HTN, CVI   REHAB POTENTIAL: Good  EVALUATION COMPLEXITY: Moderate   GOALS: Goals reviewed with patient?  Yes  SHORT TERM GOALS: Target date: 4th OT Rx visit  Pt will demonstrate understanding of lymphedema precautions and prevention strategies with modified independence using a printed reference to identify at least 5 precautions and discussing how s/he may implement them into daily life to reduce risk of progression with extra time. Baseline:Max A Goal status: 05/07/23 GOAL MET  2.  Pt will be able to apply multilayer, thigh length, gradient, compression wraps to one leg at a time with modified assistance (extra time) to decrease limb volume, to limit infection risk, and to limit lymphedema progression.  Baseline: Dependent Goal status: 05/07/23 GOAL MET  LONG TERM GOALS: Target date: 06/12/23  Given this patient's Intake score of 81/100% on the functional outcomes FOTO tool, patient will experience an increase in function of 3 points to improve basic and instrumental ADLs performance, including lymphedema self-care.  Baseline: 81% Goal status: 05/07/23 PROGRESSING  2.  Given this patient's Intake score of 20.59 % on the Lymphedema Life Impact Scale (LLIS), patient will experience a reduction of at least 5 points in her perceived level of functional impairment resulting from lymphedema to improve functional performance and quality of life (QOL). Baseline: 20.59% Goal status: 05/07/23 GOAL MET  3.  During Intensive phase CDT Pt will achieve at least 85% compliance with all lymphedema self-care home program components, including daily skin care, compression wraps and /or garments, simple self MLD and lymphatic pumping therex to habituate LE self care protocol  into ADLs for optimal LE self-management over time. Baseline: Dependent Goal status:05/07/23 GOAL MET  4.  Pt will achieve at least a 10% volume reduction in the BLE to return limb to typical size and shape, to limit infection risk and LE progression, to decrease pain, to improve function. Baseline: Dependent Goal status: 05/07/23 PROGRESSING:  L LEG reduction = 5.45% since commencing CDT on 03/21/23.  L THIGH is essentially unchanged with INCREASED volume measuring 0.86%. Full LLE volume reduction measures 1.93% to date.  5.  STG: Pt will obtain proper compression garments/devices and achieve modified independence (extra time + assistive devices) with donning/doffing to optimize limb volume reductions and limit LE  progression over time. Baseline: Max A Goal status: 05/07/23 PROGRESSING  PLAN:  PT FREQUENCY: 2x/weekand PRN  PT DURATION: other: and PRN  PLANNED INTERVENTIONS: Complete Decongestive Therapy (CDT): Therapeutic lymphatic pumping exercises, skin care simultaneously w MLD to limit infection risk, Manual lymph drainage (MLD), During Intensive Phase- multilayer Compression bandaging; during self-management phase formulate appropriate compression garments and devices, complete anatomical measurements, fitting and assessment t of garments; Consider trial of Flexitouch ADVANCED sequential compression device as replacement for incorrect basic vaso pneumatic device. Patient/Family education, DME instructions,    PLAN FOR NEXT SESSION:  Cont Pt edu for simple self-MLD, wrapping, there ex Continue MLD to LLE/LLQ. Cont LLE knee length multilayer compression wrap   Loel Dubonnet, Kelsey, OTR/L, CLT-LANA 05/30/23 10:03 AM

## 2023-05-31 DIAGNOSIS — R69 Illness, unspecified: Secondary | ICD-10-CM | POA: Diagnosis not present

## 2023-05-31 DIAGNOSIS — H60532 Acute contact otitis externa, left ear: Secondary | ICD-10-CM | POA: Diagnosis not present

## 2023-05-31 DIAGNOSIS — R42 Dizziness and giddiness: Secondary | ICD-10-CM | POA: Diagnosis not present

## 2023-06-04 ENCOUNTER — Ambulatory Visit: Payer: 59 | Admitting: Occupational Therapy

## 2023-06-06 ENCOUNTER — Ambulatory Visit: Payer: 59 | Admitting: Occupational Therapy

## 2023-06-06 DIAGNOSIS — I890 Lymphedema, not elsewhere classified: Secondary | ICD-10-CM | POA: Diagnosis not present

## 2023-06-06 NOTE — Therapy (Signed)
OUTPATIENT OCCUPATIONAL THERAPY TREATMENT NOTE LOWER EXTREMITY LYMPHEDEMA  Patient Name: Kelsey Terry MRN: [id number] DOB:[birth year], 62 y.o., female Today's Date: 05/16/23    END OF SESSION:   OT End of Session - 06/06/23 0906     Visit Number 16    Number of Visits 36    Date for OT Re-Evaluation 06/12/23    OT Start Time 0906    OT Stop Time 1006    OT Time Calculation (min) 60 min    Activity Tolerance Patient tolerated treatment well;No increased pain    Behavior During Therapy [facility] for tasks assessed/performed               Past Medical History:  Diagnosis Date   Allergies    Asthma    Blood dyscrasia    blood clot in right leg   CAD (coronary artery disease) 05/05/2019   CAD in native artery    a. NSTEMI 05/16/18: LHC 05/16/18 - ost-pLAD 95% s/p PCI/DES, mLAD 30%, ostD1 70%, ost-pLCx 30%, mRCA 90%, dRCA 20%; b. staged PCI/DES to [facility] 7/1   Complication of anesthesia    patient stated she had "some kind of reaction to anesthesia, "thought she was having a stroke" during her second cath procedure.   Diabetes mellitus without complication (HCC)    pre-diabetes   Diastolic dysfunction    a. TTE 05/16/18: EF 55-60%, no RWMA, Gr1DD, trivial AI, calcified mitral annulus, mild MR, mildly dilated LA   Dyspnea    with activity   Hypertension    Lymphedema    LLE   Pericarditis    a. ~ 2012 in [place], [place] s/p pericardiocentesis    Wears contact lenses    Past Surgical History:  Procedure Laterality Date   ABDOMINAL HYSTERECTOMY     CESAREAN SECTION     COLONOSCOPY WITH PROPOFOL N/A 08/13/2019   Procedure: COLONOSCOPY WITH BIOPSIES;  Surgeon: Midge Minium, MD;  Location: [facility];  Service: Endoscopy;  Laterality: N/A;   CORONARY STENT INTERVENTION N/A 05/16/2018   Procedure: CORONARY STENT INTERVENTION;  Surgeon: Iran Ouch, MD;  Location: [facility] INVAS[facility] CV [facility];  Service: Cardiovascular;  Laterality: N/A;   CORONARY STENT INTERVENTION N/A 05/19/2018    Procedure: CORONARY STENT INTERVENTION;  Surgeon: Iran Ouch, MD;  Location: [facility] INVASIVE CV LAB;  Service: Cardiovascular;  Laterality: N/A;   LEFT HEART CATH AND CORONARY ANGIOGRAPHY N/A 05/16/2018   Procedure: LEFT HEART CATH AND CORONARY ANGIOGRAPHY;  Surgeon: Iran Ouch, MD;  Location: [facility] INVASIVE CV LAB;  Service: Cardiovascular;  Laterality: N/A;   PERICARDIOCENTESIS     POLYPECTOMY N/A 08/13/2019   Procedure: POLYPECTOMY;  Surgeon: Midge Minium, MD;  Location: [facility];  Service: Endoscopy;  Laterality: N/A;   Patient Active Problem List   Diagnosis Date Noted   Cough 03/31/2022   Hyperlipidemia 03/06/2021   Familial hypercholesterolemia 08/23/2020   Acute bilateral knee pain 01/13/2020   Health care maintenance 11/24/2019   H/O elevated lipids 11/04/2019   Lymphedema 08/26/2019   Chronic venous insufficiency 08/26/2019   Encounter for screening colonoscopy    Polyp of sigmoid colon    Polyp of ascending colon    Lymphedema of left leg 06/16/2019   Prediabetes 06/16/2019   Abnormal laboratory test result 06/16/2019   Prediabetes 05/20/2019   Essential hypertension 05/19/2019   Abscess of left buttock 05/05/2019   Essential hypertension 05/05/2019   CAD (coronary artery disease) 05/05/2019   NSTEMI (non-ST elevated myocardial infarction) (HCC) 05/16/2018  PCP: Duanne Limerick, MD  REFERRING PROVIDER: Levora Dredge, MD  REFERRING DIAG: I89.0  THERAPY DIAG:  Lymphedema, not elsewhere classified  Rationale for Evaluation and Treatment: Rehabilitation  ONSET DATE: 03/14/23  SUBJECTIVE:                                                                                                                                                                                           SUBJECTIVE STATEMENT: Kelsey Terry presents for OT visit to address BLE lymphedema, L>R. Pt presents on time with clean wraps. Pt denies LE-related leg pain and has no new  complaints.  PERTINENT HISTORY: LE, CVI, HTN, Prediabetes, CAD, s/p NSTEMI 04/2028  PAIN: Are you having lymphedema -related  pain? No  PRECAUTIONS: Other: LYMPHEDEMA PRECAUTIONS: CARDIAC, DM- skin  FALLS: Has patient fallen since last visit? No  HAND DOMINANCE: right   PRIOR LEVEL OF FUNCTION: Independent  PATIENT GOALS: to be able to do something better to take care of my legs; to learn about any new management technology; to keep the lymphedema from getting worse."   OBJECTIVE:  OBSERVATIONS / OTHER ASSESSMENTS: Moderate, Stage  II, Primary, Bilateral Lower Extremity Lymphedema, L>R (Lymphedema [place])  FOTO functional outcome score: Intake 03/14/23: 81%  Lymphedema Life Impact Scale (LLIS) Intake 03/14/23: 20.59% (The extent to which lymphedema -related problems impacted your life in the past week)    BLE COMPARATIVE LIMB VOLUMETRICS Intake Visit 1 03/21/23  LANDMARK RIGHT    R LEG (A-D) 3627.8 ml  R THIGH (E-G) 6402.0 ml  R FULL LIMB (A-G) 10029.8 ml  Limb Volume differential (LVD)  %  Volume change since initial %  Volume change overall V  (Blank rows = not tested)  LANDMARK LEFT    L LEG (A-D) 4875.0 ml  L THIGH (E-G) 6129.6 ml  L  FULL LIMB (A-G) 11004.6 ml  Limb Volume differential (LVD)  Leg LVD = 25.6%, L>R Thigh LVD = 4.41%, R (dominant)>L Full Limb LVD = 8.85%, L>R%  Volume change since initial %  Volume change overall %  (Blank rows = not tested)   LLE COMPARATIVE LIMB VOLUMETRICS 9 th Rx Visit  05/03/23  LANDMARK LEFT    L LEG (A-D) 4609.5 ml  L THIGH (E-G) 6182.3 ml  L  FULL LIMB (A-G) 10791.9 ml  Limb Volume differential (LVD)  L LEG reduction = 5.45% since commencing CDT on 03/21/23.  L THIGH is essentially unchanged with INCREASED volume measuring 0.86%. Full LLE volume reduction measures 1.93% to date.  Volume change since initial %  Volume change overall %  (Blank rows = not tested)   TODAY'S TREATMENT:  LLE MLD Multilayer, knee length,  LLE compression wrap                                                                                                                              PATIENT EDUCATION:  Continued Pt/ CG edu for lymphedema self care home program throughout session. Topics include outcome of comparative limb volumetrics- starting limb volume differentials (LVDs), technology and gradient techniques used for short stretch, multilayer compression wrapping, simple self-MLD, therapeutic lymphatic pumping exercises, skin/nail care, LE precautions,. compression garment recommendations and specifications, wear and care schedule and compression garment donning / doffing w assistive devices. Discussed progress towards all OT goals since commencing CDT. All questions answered to the Pt's satisfaction. Good return.  Person educated: Patient Education method: Explanation, Demonstration, and Handouts Education comprehension: verbalized understanding, returned demonstration, and needs further education  LE SELF-CARE HOME PROGRAM: BLE Lymphatic Pumping There ex Skin care to limit infection risk Compression Intensive stage compression: multilayer short stretch wraps with gradient techniques. One limb at a time. Self-management Phase Compression: Custom, flat knit, BLE compression knee highs. Consider Elvarex classic for daytime and Jobst Relax for HOS Simple self-MLD LLE/LLQ MLD utilizing short neck sequence, deep, diaphragmatic breathing, stationary J strokes to functional inguinal LN at L groin, then dynamic J strokes from proximal to distal from thigh, knee, leg and foot, with 3 sweeps to terminus to complete session.   Custom-made gradient compression garments and HOS devices are medically necessary in this case because they are uniquely sized and shaped to fit the exact dimensions of the affected extremities with deformities, and to provide accurate and consistent gradient compression and containment, essential to optimally managing  this patient's symptoms of chronic, progressive lymphedema. Multiple custom compression garments are needed for optimal hygiene to limit infection risk. Custom compression garments should be replaced q 3-6 months When worn consistently for optimal lipo-lymphedema self-management over time.  ASSESSMENT: CLINICAL IMPRESSION:  Dorsal foot swelling is slightly reduced as is palpable fibrosis. Chip pad made last visit has had positive effect of softening fibrosis without irritating skin or causing pain. We'll continue to use it at each visit.  Reapplied compression wraps after MLD and skin care to LLE. LLE not yet ready for garment measurements. Cont CDT until reduction appears to plateau. Cont as per POC.  OBJECTIVE IMPAIRMENTS: decreased knowledge of condition, decreased knowledge of use of DME, decreased mobility, increased edema, pain, and chronic lower extremity swelling and associated pain, lymphedema related skin changes ( decreased excursion, increased skin approximation) increased infection risk  ACTIVITY LIMITATIONS: sitting tolerance, standing tolerance, and functional ambulation. Basic and Instrumental ADLS ( fitting shoes, socks and lower body clothing, grooming nails, inspecting skin, performing skin care, driving, shopping, housework) leisure pursuits (walking for exercise), productive activities ( caring for others, work duties),   PARTICIPATION LIMITATIONS:  negative body image limits clothing selection and social participation  PERSONAL FACTORS: Time since onset of onset/ level of progression; exacerbating co  morbidities: : HTN, CVI   REHAB POTENTIAL: Good  EVALUATION COMPLEXITY: Moderate   GOALS: Goals reviewed with patient? Yes  SHORT TERM GOALS: Target date: 4th OT Rx visit  Pt will demonstrate understanding of lymphedema precautions and prevention strategies with modified independence using a printed reference to identify at least 5 precautions and discussing how s/he may  implement them into daily life to reduce risk of progression with extra time. Baseline:Max A Goal status: 05/07/23 GOAL MET  2.  Pt will be able to apply multilayer, thigh length, gradient, compression wraps to one leg at a time with modified assistance (extra time) to decrease limb volume, to limit infection risk, and to limit lymphedema progression.  Baseline: Dependent Goal status: 05/07/23 GOAL MET  LONG TERM GOALS: Target date: 06/12/23  Given this patient's Intake score of 81/100% on the functional outcomes FOTO tool, patient will experience an increase in function of 3 points to improve basic and instrumental ADLs performance, including lymphedema self-care.  Baseline: 81% Goal status: 05/07/23 PROGRESSING  2.  Given this patient's Intake score of 20.59 % on the Lymphedema Life Impact Scale (LLIS), patient will experience a reduction of at least 5 points in her perceived level of functional impairment resulting from lymphedema to improve functional performance and quality of life (QOL). Baseline: 20.59% Goal status: 05/07/23 GOAL MET  3.  During Intensive phase CDT Pt will achieve at least 85% compliance with all lymphedema self-care home program components, including daily skin care, compression wraps and /or garments, simple self MLD and lymphatic pumping therex to habituate LE self care protocol  into ADLs for optimal LE self-management over time. Baseline: Dependent Goal status:05/07/23 GOAL MET  4.  Pt will achieve at least a 10% volume reduction in the BLE to return limb to typical size and shape, to limit infection risk and LE progression, to decrease pain, to improve function. Baseline: Dependent Goal status: 05/07/23 PROGRESSING: L LEG reduction = 5.45% since commencing CDT on 03/21/23.  L THIGH is essentially unchanged with INCREASED volume measuring 0.86%. Full LLE volume reduction measures 1.93% to date.  5.  STG: Pt will obtain proper compression garments/devices and achieve  modified independence (extra time + assistive devices) with donning/doffing to optimize limb volume reductions and limit LE  progression over time. Baseline: Max A Goal status: 05/07/23 PROGRESSING  PLAN:  PT FREQUENCY: 2x/weekand PRN  PT DURATION: other: and PRN  PLANNED INTERVENTIONS: Complete Decongestive Therapy (CDT): Therapeutic lymphatic pumping exercises, skin care simultaneously w MLD to limit infection risk, Manual lymph drainage (MLD), During Intensive Phase- multilayer Compression bandaging; during self-management phase formulate appropriate compression garments and devices, complete anatomical measurements, fitting and assessment t of garments; Consider trial of Flexitouch ADVANCED sequential compression device as replacement for incorrect basic vaso pneumatic device. Patient/Family education, DME instructions,    PLAN FOR NEXT SESSION:  Cont Pt edu for simple self-MLD, wrapping, there ex Continue MLD to LLE/LLQ. Cont LLE knee length multilayer compression wrap   Loel Dubonnet, Kelsey, OTR/L, CLT-LANA 06/06/23 11:43 AM

## 2023-06-10 ENCOUNTER — Encounter: Payer: Self-pay | Admitting: Occupational Therapy

## 2023-06-10 ENCOUNTER — Ambulatory Visit: Payer: 59 | Admitting: Occupational Therapy

## 2023-06-10 DIAGNOSIS — I890 Lymphedema, not elsewhere classified: Secondary | ICD-10-CM | POA: Diagnosis not present

## 2023-06-10 NOTE — Therapy (Unsigned)
OUTPATIENT OCCUPATIONAL THERAPY TREATMENT NOTE LOWER EXTREMITY LYMPHEDEMA  Patient Name: Kelsey Terry MRN: [id number] DOB:May 01, 1961, 62 y.o., female Today's Date: 05/16/23    END OF SESSION:   OT End of Session - 06/10/23 0910     Visit Number 17    Number of Visits 36    Date for OT Re-Evaluation 06/12/23    OT Start Time 0901    OT Stop Time 1005    OT Time Calculation (min) 64 min    Activity Tolerance Patient tolerated treatment well;No increased pain    Behavior During Therapy [facility] for tasks assessed/performed               Past Medical History:  Diagnosis Date   Allergies    Asthma    Blood dyscrasia    blood clot in right leg   CAD (coronary artery disease) 05/05/2019   CAD in native artery    a. NSTEMI 05/16/18: LHC 05/16/18 - ost-pLAD 95% s/p PCI/DES, mLAD 30%, ostD1 70%, ost-pLCx 30%, mRCA 90%, dRCA 20%; b. staged PCI/DES to [facility] 7/1   Complication of anesthesia    patient stated she had "some kind of reaction to anesthesia, "thought she was having a stroke" during her second cath procedure.   Diabetes mellitus without complication (HCC)    pre-diabetes   Diastolic dysfunction    a. TTE 05/16/18: EF 55-60%, no RWMA, Gr1DD, trivial AI, calcified mitral annulus, mild MR, mildly dilated LA   Dyspnea    with activity   Hypertension    Lymphedema    LLE   Pericarditis    a. ~ 2012 in [place], [place] s/p pericardiocentesis    Wears contact lenses    Past Surgical History:  Procedure Laterality Date   ABDOMINAL HYSTERECTOMY     CESAREAN SECTION     COLONOSCOPY WITH PROPOFOL N/A 08/13/2019   Procedure: COLONOSCOPY WITH BIOPSIES;  Surgeon: Midge Minium, MD;  Location: [facility];  Service: Endoscopy;  Laterality: N/A;   CORONARY STENT INTERVENTION N/A 05/16/2018   Procedure: CORONARY STENT INTERVENTION;  Surgeon: Iran Ouch, MD;  Location: [facility] INVASIVE CV [facility];  Service: Cardiovascular;  Laterality: N/A;   CORONARY STENT INTERVENTION N/A 05/19/2018    Procedure: CORONARY STENT INTERVENTION;  Surgeon: Iran Ouch, MD;  Location: [facility] INVASIVE CV LAB;  Service: Cardiovascular;  Laterality: N/A;   LEFT HEART CATH AND CORONARY ANGIOGRAPHY N/A 05/16/2018   Procedure: LEFT HEART CATH AND CORONARY ANGIOGRAPHY;  Surgeon: Iran Ouch, MD;  Location: [facility] INVASIVE CV LAB;  Service: Cardiovascular;  Laterality: N/A;   PERICARDIOCENTESIS     POLYPECTOMY N/A 08/13/2019   Procedure: POLYPECTOMY;  Surgeon: Midge Minium, MD;  Location: [facility];  Service: Endoscopy;  Laterality: N/A;   Patient Active Problem List   Diagnosis Date Noted   Cough 03/31/2022   Hyperlipidemia 03/06/2021   Familial hypercholesterolemia 08/23/2020   Acute bilateral knee pain 01/13/2020   Health care maintenance 11/24/2019   H/O elevated lipids 11/04/2019   Lymphedema 08/26/2019   Chronic venous insufficiency 08/26/2019   Encounter for screening colonoscopy    Polyp of sigmoid colon    Polyp of ascending colon    Lymphedema of left leg 06/16/2019   Prediabetes 06/16/2019   Abnormal laboratory test result 06/16/2019   Prediabetes 05/20/2019   Essential hypertension 05/19/2019   Abscess of left buttock 05/05/2019   Essential hypertension 05/05/2019   CAD (coronary artery disease) 05/05/2019   NSTEMI (non-ST elevated myocardial infarction) (HCC) 05/16/2018  PCP: Duanne Limerick, MD  REFERRING PROVIDER: Levora Dredge, MD  REFERRING DIAG: I89.0  THERAPY DIAG:  Lymphedema, not elsewhere classified  Rationale for Evaluation and Treatment: Rehabilitation  ONSET DATE: 03/14/23  SUBJECTIVE:                                                                                                                                                                                           SUBJECTIVE STATEMENT: Kelsey Terry presents for OT visit to address BLE lymphedema, L>R. Pt presents on time with clean wraps. Pt denies LE-related leg pain and has no new  complaints.  PERTINENT HISTORY: LE, CVI, HTN, Prediabetes, CAD, s/p NSTEMI 04/2028  PAIN: Are you having lymphedema -related  pain? No  PRECAUTIONS: Other: LYMPHEDEMA PRECAUTIONS: CARDIAC, DM- skin  FALLS: Has patient fallen since last visit? No  HAND DOMINANCE: right   PRIOR LEVEL OF FUNCTION: Independent  PATIENT GOALS: to be able to do something better to take care of my legs; to learn about any new management technology; to keep the lymphedema from getting worse."   OBJECTIVE:  OBSERVATIONS / OTHER ASSESSMENTS: Moderate, Stage  II, Primary, Bilateral Lower Extremity Lymphedema, L>R (Lymphedema [place])  FOTO functional outcome score: Intake 03/14/23: 81%  Lymphedema Life Impact Scale (LLIS) Intake 03/14/23: 20.59% (The extent to which lymphedema -related problems impacted your life in the past week)    BLE COMPARATIVE LIMB VOLUMETRICS Intake Visit 1 03/21/23  LANDMARK RIGHT    R LEG (A-D) 3627.8 ml  R THIGH (E-G) 6402.0 ml  R FULL LIMB (A-G) 10029.8 ml  Limb Volume differential (LVD)  %  Volume change since initial %  Volume change overall V  (Blank rows = not tested)  LANDMARK LEFT    L LEG (A-D) 4875.0 ml  L THIGH (E-G) 6129.6 ml  L  FULL LIMB (A-G) 11004.6 ml  Limb Volume differential (LVD)  Leg LVD = 25.6%, L>R Thigh LVD = 4.41%, R (dominant)>L Full Limb LVD = 8.85%, L>R%  Volume change since initial %  Volume change overall %  (Blank rows = not tested)   LLE COMPARATIVE LIMB VOLUMETRICS 9 th Rx Visit  05/03/23  LANDMARK LEFT    L LEG (A-D) 4609.5 ml  L THIGH (E-G) 6182.3 ml  L  FULL LIMB (A-G) 10791.9 ml  Limb Volume differential (LVD)  L LEG reduction = 5.45% since commencing CDT on 03/21/23.  L THIGH is essentially unchanged with INCREASED volume measuring 0.86%. Full LLE volume reduction measures 1.93% to date.  Volume change since initial %  Volume change overall %  (Blank rows = not tested)   TODAY'S TREATMENT:  LLE MLD Multilayer, knee length,  LLE compression wrap                                                                                                                              PATIENT EDUCATION:  Continued Pt/ CG edu for lymphedema self care home program throughout session. Topics include outcome of comparative limb volumetrics- starting limb volume differentials (LVDs), technology and gradient techniques used for short stretch, multilayer compression wrapping, simple self-MLD, therapeutic lymphatic pumping exercises, skin/nail care, LE precautions,. compression garment recommendations and specifications, wear and care schedule and compression garment donning / doffing w assistive devices. Discussed progress towards all OT goals since commencing CDT. All questions answered to the Pt's satisfaction. Good return.  Person educated: Patient Education method: Explanation, Demonstration, and Handouts Education comprehension: verbalized understanding, returned demonstration, and needs further education  LE SELF-CARE HOME PROGRAM: BLE Lymphatic Pumping There ex Skin care to limit infection risk Compression Intensive stage compression: multilayer short stretch wraps with gradient techniques. One limb at a time. Self-management Phase Compression: Custom, flat knit, BLE compression knee highs. Consider Elvarex classic for daytime and Jobst Relax for HOS Simple self-MLD LLE/LLQ MLD utilizing short neck sequence, deep, diaphragmatic breathing, stationary J strokes to functional inguinal LN at L groin, then dynamic J strokes from proximal to distal from thigh, knee, leg and foot, with 3 sweeps to terminus to complete session.   Custom-made gradient compression garments and HOS devices are medically necessary in this case because they are uniquely sized and shaped to fit the exact dimensions of the affected extremities with deformities, and to provide accurate and consistent gradient compression and containment, essential to optimally managing  this patient's symptoms of chronic, progressive lymphedema. Multiple custom compression garments are needed for optimal hygiene to limit infection risk. Custom compression garments should be replaced q 3-6 months When worn consistently for optimal lipo-lymphedema self-management over time.  ASSESSMENT: CLINICAL IMPRESSION: Emphasis of Pt edu this morning on LE precautions and signs/ symptoms of cellulitis. Pt able to discuss skin care precautions and what to do in   event of suspected cellulitis infection after skilled teaching using printed reference . GOAL MET. Dorsal foot swelling is full this morning. Pt did not bring custom dorsal foot chip pad. Reapplied compression wraps after MLD and skin care to LLE. LLE not yet ready for garment measurements. Cont CDT until reduction appears to plateau. Cont as per POC.  OBJECTIVE IMPAIRMENTS: decreased knowledge of condition, decreased knowledge of use of DME, decreased mobility, increased edema, pain, and chronic lower extremity swelling and associated pain, lymphedema related skin changes ( decreased excursion, increased skin approximation) increased infection risk  ACTIVITY LIMITATIONS: sitting tolerance, standing tolerance, and functional ambulation. Basic and Instrumental ADLS ( fitting shoes, socks and lower body clothing, grooming nails, inspecting skin, performing skin care, driving, shopping, housework) leisure pursuits (walking for exercise), productive activities ( caring for others, work duties),   PARTICIPATION LIMITATIONS:  negative body  image limits clothing selection and social participation  PERSONAL FACTORS: Time since onset of onset/ level of progression; exacerbating co morbidities: : HTN, CVI   REHAB POTENTIAL: Good  EVALUATION COMPLEXITY: Moderate   GOALS: Goals reviewed with patient? Yes  SHORT TERM GOALS: Target date: 4th OT Rx visit  Pt will demonstrate understanding of lymphedema precautions and prevention strategies with  modified independence using a printed reference to identify at least 5 precautions and discussing how s/he may implement them into daily life to reduce risk of progression with extra time. Baseline:Max A Goal status: 06/10/23 GOAL MET  2.  Pt will be able to apply multilayer, thigh length, gradient, compression wraps to one leg at a time with modified assistance (extra time) to decrease limb volume, to limit infection risk, and to limit lymphedema progression.  Baseline: Dependent Goal status: 05/07/23 GOAL MET  LONG TERM GOALS: Target date: 06/12/23  Given this patient's Intake score of 81/100% on the functional outcomes FOTO tool, patient will experience an increase in function of 3 points to improve basic and instrumental ADLs performance, including lymphedema self-care.  Baseline: 81% Goal status: 05/07/23 PROGRESSING  2.  Given this patient's Intake score of 20.59 % on the Lymphedema Life Impact Scale (LLIS), patient will experience a reduction of at least 5 points in her perceived level of functional impairment resulting from lymphedema to improve functional performance and quality of life (QOL). Baseline: 20.59% Goal status: 05/07/23 GOAL MET  3.  During Intensive phase CDT Pt will achieve at least 85% compliance with all lymphedema self-care home program components, including daily skin care, compression wraps and /or garments, simple self MLD and lymphatic pumping therex to habituate LE self care protocol  into ADLs for optimal LE self-management over time. Baseline: Dependent Goal status:05/07/23 GOAL MET  4.  Pt will achieve at least a 10% volume reduction in the BLE to return limb to typical size and shape, to limit infection risk and LE progression, to decrease pain, to improve function. Baseline: Dependent Goal status: 05/07/23 PROGRESSING: L LEG reduction = 5.45% since commencing CDT on 03/21/23.  L THIGH is essentially unchanged with INCREASED volume measuring 0.86%. Full LLE volume  reduction measures 1.93% to date.  5.  STG: Pt will obtain proper compression garments/devices and achieve modified independence (extra time + assistive devices) with donning/doffing to optimize limb volume reductions and limit LE  progression over time. Baseline: Max A Goal status: 05/07/23 PROGRESSING  PLAN:  PT FREQUENCY: 2x/weekand PRN  PT DURATION: other: and PRN  PLANNED INTERVENTIONS: Complete Decongestive Therapy (CDT): Therapeutic lymphatic pumping exercises, skin care simultaneously w MLD to limit infection risk, Manual lymph drainage (MLD), During Intensive Phase- multilayer Compression bandaging; during self-management phase formulate appropriate compression garments and devices, complete anatomical measurements, fitting and assessment t of garments; Consider trial of Flexitouch ADVANCED sequential compression device as replacement for incorrect basic vaso pneumatic device. Patient/Family education, DME instructions,    PLAN FOR NEXT SESSION:  Cont Pt edu for simple self-MLD, wrapping, there ex Continue MLD to LLE/LLQ. Cont LLE knee length multilayer compression wrap   Loel Dubonnet, Kelsey, OTR/L, CLT-LANA 06/11/23 8:56 AM

## 2023-06-11 ENCOUNTER — Encounter: Payer: 59 | Admitting: Occupational Therapy

## 2023-06-12 ENCOUNTER — Ambulatory Visit: Payer: 59 | Admitting: Occupational Therapy

## 2023-06-13 ENCOUNTER — Encounter: Payer: 59 | Admitting: Occupational Therapy

## 2023-06-18 ENCOUNTER — Ambulatory Visit: Payer: 59 | Admitting: Occupational Therapy

## 2023-06-18 ENCOUNTER — Encounter: Payer: 59 | Admitting: Occupational Therapy

## 2023-06-18 DIAGNOSIS — I890 Lymphedema, not elsewhere classified: Secondary | ICD-10-CM

## 2023-06-18 NOTE — Therapy (Signed)
OUTPATIENT OCCUPATIONAL THERAPY TREATMENT NOTE LOWER EXTREMITY LYMPHEDEMA  Patient Name: Kelsey Terry MRN: [id number] DOB:Jan 11, 1961, 62 y.o., female Today's Date: 05/16/23    END OF SESSION:   OT End of Session - 06/18/23 0910     Visit Number 18    Number of Visits 36    Date for OT Re-Evaluation 06/12/23    OT Start Time 0900    Activity Tolerance Patient tolerated treatment well;No increased pain    Behavior During Therapy [facility] for tasks assessed/performed               Past Medical History:  Diagnosis Date   Allergies    Asthma    Blood dyscrasia    blood clot in right leg   CAD (coronary artery disease) 05/05/2019   CAD in native artery    a. NSTEMI 05/16/18: LHC 05/16/18 - ost-pLAD 95% s/p PCI/DES, mLAD 30%, ostD1 70%, ost-pLCx 30%, mRCA 90%, dRCA 20%; b. staged PCI/DES to [facility] 7/1   Complication of anesthesia    patient stated she had "some kind of reaction to anesthesia, "thought she was having a stroke" during her second cath procedure.   Diabetes mellitus without complication (HCC)    pre-diabetes   Diastolic dysfunction    a. TTE 05/16/18: EF 55-60%, no RWMA, Gr1DD, trivial AI, calcified mitral annulus, mild MR, mildly dilated LA   Dyspnea    with activity   Hypertension    Lymphedema    LLE   Pericarditis    a. ~ 2012 in [place], [place] s/p pericardiocentesis    Wears contact lenses    Past Surgical History:  Procedure Laterality Date   ABDOMINAL HYSTERECTOMY     CESAREAN SECTION     COLONOSCOPY WITH PROPOFOL N/A 08/13/2019   Procedure: COLONOSCOPY WITH BIOPSIES;  Surgeon: Midge Minium, MD;  Location: [facility];  Service: Endoscopy;  Laterality: N/A;   CORONARY STENT INTERVENTION N/A 05/16/2018   Procedure: CORONARY STENT INTERVENTION;  Surgeon: Iran Ouch, MD;  Location: [facility] INVAS[facility] CV [facility];  Service: Cardiovascular;  Laterality: N/A;   CORONARY STENT INTERVENTION N/A 05/19/2018   Procedure: CORONARY STENT INTERVENTION;  Surgeon: Iran Ouch, MD;  Location: [facility] INVASIVE CV LAB;  Service: Cardiovascular;  Laterality: N/A;   LEFT HEART CATH AND CORONARY ANGIOGRAPHY N/A 05/16/2018   Procedure: LEFT HEART CATH AND CORONARY ANGIOGRAPHY;  Surgeon: Iran Ouch, MD;  Location: [facility] INVASIVE CV LAB;  Service: Cardiovascular;  Laterality: N/A;   PERICARDIOCENTESIS     POLYPECTOMY N/A 08/13/2019   Procedure: POLYPECTOMY;  Surgeon: Midge Minium, MD;  Location: [facility];  Service: Endoscopy;  Laterality: N/A;   Patient Active Problem List   Diagnosis Date Noted   Cough 03/31/2022   Hyperlipidemia 03/06/2021   Familial hypercholesterolemia 08/23/2020   Acute bilateral knee pain 01/13/2020   Health care maintenance 11/24/2019   H/O elevated lipids 11/04/2019   Lymphedema 08/26/2019   Chronic venous insufficiency 08/26/2019   Encounter for screening colonoscopy    Polyp of sigmoid colon    Polyp of ascending colon    Lymphedema of left leg 06/16/2019   Prediabetes 06/16/2019   Abnormal laboratory test result 06/16/2019   Prediabetes 05/20/2019   Essential hypertension 05/19/2019   Abscess of left buttock 05/05/2019   Essential hypertension 05/05/2019   CAD (coronary artery disease) 05/05/2019   NSTEMI (non-ST elevated myocardial infarction) (HCC) 05/16/2018    PCP: Duanne Limerick, MD  REFERRING PROVIDER: Levora Dredge, MD  REFERRING DIAG:  I89.0  THERAPY DIAG:  Lymphedema, not elsewhere classified  Rationale for Evaluation and Treatment: Rehabilitation  ONSET DATE: 03/14/23  SUBJECTIVE:                                                                                                                                                                                           SUBJECTIVE STATEMENT: Kelsey Terry presents for OT visit to address BLE lymphedema, L>R. Pt presents on time with clean wraps. Pt denies LE-related leg pain and has no new complaints.  PERTINENT HISTORY: LE, CVI, HTN, Prediabetes, CAD,  s/p NSTEMI 04/2028  PAIN: Are you having lymphedema -related  pain? No  PRECAUTIONS: Other: LYMPHEDEMA PRECAUTIONS: CARDIAC, DM- skin  FALLS: Has patient fallen since last visit? No  HAND DOMINANCE: right   PRIOR LEVEL OF FUNCTION: Independent  PATIENT GOALS: to be able to do something better to take care of my legs; to learn about any new management technology; to keep the lymphedema from getting worse."   OBJECTIVE:  OBSERVATIONS / OTHER ASSESSMENTS: Moderate, Stage  II, Primary, Bilateral Lower Extremity Lymphedema, L>R (Lymphedema [place]ecox)  FOTO functional outcome score: Intake 03/14/23: 81%  Lymphedema Life Impact Scale (LLIS) Intake 03/14/23: 20.59% (The extent to which lymphedema -related problems impacted your life in the past week)    BLE COMPARATIVE LIMB VOLUMETRICS Intake Visit 1 03/21/23  LANDMARK RIGHT    R LEG (A-D) 3627.8 ml  R THIGH (E-G) 6402.0 ml  R FULL LIMB (A-G) 10029.8 ml  Limb Volume differential (LVD)  %  Volume change since initial %  Volume change overall V  (Blank rows = not tested)  LANDMARK LEFT    L LEG (A-D) 4875.0 ml  L THIGH (E-G) 6129.6 ml  L  FULL LIMB (A-G) 11004.6 ml  Limb Volume differential (LVD)  Leg LVD = 25.6%, L>R Thigh LVD = 4.41%, R (dominant)>L Full Limb LVD = 8.85%, L>R%  Volume change since initial %  Volume change overall %  (Blank rows = not tested)   LLE COMPARATIVE LIMB VOLUMETRICS 9 th Rx Visit  05/03/23  LANDMARK LEFT    L LEG (A-D) 4609.5 ml  L THIGH (E-G) 6182.3 ml  L  FULL LIMB (A-G) 10791.9 ml  Limb Volume differential (LVD)  L LEG reduction = 5.45% since commencing CDT on 03/21/23.  L THIGH is essentially unchanged with INCREASED volume measuring 0.86%. Full LLE volume reduction measures 1.93% to date.  Volume change since initial %  Volume change overall %  (Blank rows = not tested)   LLE COMPARATIVE LIMB VOLUMETRICS 18 th Rx Visit  06/18/23  [facility] LEFT  L LEG (A-D) 4544.6  ml  L THIGH (E-G)  6362.5 ml  L  FULL LIMB (A-G) 10907.2 ml  Limb Volume differential (LVD)  L LEG reduction = 1.4 % since last measured on 05/03/23.  L THIGH is INCREASED by 2.9% since 6/14; and full limb volume is increased overall by 2.9% since last measured. %.   Volume change since initial Since initially measured the L leg is reduced by 6.8%. The L thigh is increased by 3.8%. And the full limb volume is decreased by .9% since initially measuring volume on 03/21/23.  Volume change overall %    TODAY'S TREATMENT:  LLE comparative limb volumetrics LLE MLD Multilayer, knee length, LLE compression wrap                                                                                                                              PATIENT EDUCATION:  Continued Pt/ CG edu for lymphedema self care home program throughout session. Topics include outcome of comparative limb volumetrics- starting limb volume differentials (LVDs), technology and gradient techniques used for short stretch, multilayer compression wrapping, simple self-MLD, therapeutic lymphatic pumping exercises, skin/nail care, LE precautions,. compression garment recommendations and specifications, wear and care schedule and compression garment donning / doffing w assistive devices. Discussed progress towards all OT goals since commencing CDT. All questions answered to the Pt's satisfaction. Good return.  Person educated: Patient Education method: Explanation, Demonstration, and Handouts Education comprehension: verbalized understanding, returned demonstration, and needs further education  LE SELF-CARE HOME PROGRAM: BLE Lymphatic Pumping There ex Skin care to limit infection risk Compression Intensive stage compression: multilayer short stretch wraps with gradient techniques. One limb at a time. Self-management Phase Compression: Custom, flat knit, BLE compression knee highs. Consider Elvarex classic for daytime and Jobst Relax for HOS Simple self-MLD  LLE/LLQ MLD utilizing short neck sequence, deep, diaphragmatic breathing, stationary EJ strokes to functional inguinal LN at L groin, then dynamic EJ strokes from proximal to distal from thigh, knee, leg and foot, with 3 sweeps to terminus to complete session.   Custom-made gradient compression garments and HOS devices are medically necessary in this case because they are uniquely sized and shaped to fit the exact dimensions of the affected extremities with deformities, and to provide accurate and consistent gradient compression and containment, essential to optimally managing this patient's symptoms of chronic, progressive lymphedema. Multiple custom compression garments are needed for optimal hygiene to limit infection risk. Custom compression garments should be replaced q 3-6 months When worn consistently for optimal lipo-lymphedema self-management over time.  ASSESSMENT: CLINICAL IMPRESSION: Completed LLE comparative limb volumetrics today to assess to assess changes in LLE volume sine last measured and since commencing OT for CDT. L LEG reduction measures 1.4 % since 05/03/23. L THIGH is INCREASED by 2.9% since 6/14; and full limb volume is increased overall by 2.9% since last measured. Changes are so small that they may be typical fluctuations, however cumulative reduction in  the L LEG measuring 6.8% since starting therapy is quite a bit more than the thigh and full limb values. Pt's response has been ery slow over time, but she is tolerating compression wraps very well and would like to continue CDT in an effort to achieve more clinical gains. In addition to Pt edu for volumetrics outcome she was also educated re options and recommendations for needed custom compression garments and devices, including Elvarex toe caps, knee highs and Jobst RELAX HOS device to reduce fibrosis and improve lymphatic circulation during sleep. Pt verbalized understanding of measuring and fitting process and role of OT and DME  provider. Productive session. Cont as per POC.  OBJECTIVE IMPAIRMENTS: decreased knowledge of condition, decreased knowledge of use of DME, decreased mobility, increased edema, pain, and chronic lower extremity swelling and associated pain, lymphedema related skin changes ( decreased excursion, increased skin approximation) increased infection risk  ACTIVITY LIMITATIONS: sitting tolerance, standing tolerance, and functional ambulation. Basic and Instrumental ADLS ( fitting shoes, socks and lower body clothing, grooming nails, inspecting skin, performing skin care, driving, shopping, housework) leisure pursuits (walking for exercise), productive activities ( caring for others, work duties),   PARTICIPATION LIMITATIONS:  negative body image limits clothing selection and social participation  PERSONAL FACTORS: Time since onset of onset/ level of progression; exacerbating co morbidities: : HTN, CVI   REHAB POTENTIAL: Good  EVALUATION COMPLEXITY: Moderate   GOALS: Goals reviewed with patient? Yes  SHORT TERM GOALS: Target date: 4th OT Rx visit  Pt will demonstrate understanding of lymphedema precautions and prevention strategies with modified independence using a printed reference to identify at least 5 precautions and discussing how s/he may implement them into daily life to reduce risk of progression with extra time. Baseline:Max A Goal status: 06/10/23 GOAL MET  2.  Pt will be able to apply multilayer, thigh length, gradient, compression wraps to one leg at a time with modified assistance (extra time) to decrease limb volume, to limit infection risk, and to limit lymphedema progression.  Baseline: Dependent Goal status: 05/07/23 GOAL MET  LONG TERM GOALS: Target date: 06/12/23  Given this patient's Intake score of 81/100% on the functional outcomes FOTO tool, patient will experience an increase in function of 3 points to improve basic and instrumental ADLs performance, including lymphedema  self-care.  Baseline: 81% Goal status: 05/07/23 PROGRESSING  2.  Given this patient's Intake score of 20.59 % on the Lymphedema Life Impact Scale (LLIS), patient will experience a reduction of at least 5 points in her perceived level of functional impairment resulting from lymphedema to improve functional performance and quality of life (QOL). Baseline: 20.59% Goal status: 05/07/23 PROGRESSING  3.  During Intensive phase CDT Pt will achieve at least 85% compliance with all lymphedema self-care home program components, including daily skin care, compression wraps and /or garments, simple self MLD and lymphatic pumping therex to habituate LE self care protocol  into ADLs for optimal LE self-management over time. Baseline: Dependent Goal status:05/07/23 GOAL MET  4.  Pt will achieve at least a 10% volume reduction in the BLE to return limb to typical size and shape, to limit infection risk and LE progression, to decrease pain, to improve function. Baseline: Dependent Goal status: 05/07/23 PROGRESSING: L LEG reduction = 5.45% since commencing CDT on 03/21/23. L THIGH is essentially unchanged with INCREASED volume measuring 0.86%.Full LLE volume reduction measures 1.93% to date.  06/18/23 PROGRESSING L LEG reduction = 1.4 % since last measured on 05/03/23.  L THIGH is  INCREASED by 2.9% since 6/14; and full limb volume is increased overall by 2.9% since last measured. %.   Since initially measured the L leg is reduced by 6.8%. The L thigh is increased by 3.8%. And the full limb volume is decreased by .9% since initially measuring volume on 03/21/23.    5.  STG: Pt will obtain proper compression garments/devices and achieve modified independence (extra time + assistive devices) with donning/doffing to optimize limb volume reductions and limit LE  progression over time. Baseline: Max A Goal status: 05/07/23 PROGRESSING  PLAN:  PT FREQUENCY: 2x/weekand PRN  PT DURATION: other: and PRN  PLANNED  INTERVENTIONS: Complete Decongestive Therapy (CDT): Therapeutic lymphatic pumping exercises, skin care simultaneously w MLD to limit infection risk, Manual lymph drainage (MLD), During Intensive Phase- multilayer Compression bandaging; during self-management phase formulate appropriate compression garments and devices, complete anatomical measurements, fitting and assessment t of garments; Consider trial of Flexitouch ADVANCED sequential compression device as replacement for incorrect basic vaso pneumatic device. Patient/Family education, DME instructions,    PLAN FOR NEXT SESSION:  Review progress towards goals and plans to progress Continue MLD to LLE/LLQ. Cont LLE knee length multilayer compression wrap   Loel Dubonnet, Kelsey, OTR/L, CLT-LANA 06/18/23 9:11 AM

## 2023-06-20 ENCOUNTER — Encounter: Payer: Self-pay | Admitting: Occupational Therapy

## 2023-06-20 ENCOUNTER — Ambulatory Visit: Payer: 59 | Attending: Vascular Surgery | Admitting: Occupational Therapy

## 2023-06-20 ENCOUNTER — Encounter: Payer: 59 | Admitting: Occupational Therapy

## 2023-06-20 DIAGNOSIS — I890 Lymphedema, not elsewhere classified: Secondary | ICD-10-CM | POA: Insufficient documentation

## 2023-06-20 NOTE — Therapy (Signed)
OUTPATIENT OCCUPATIONAL THERAPY TREATMENT NOTE LOWER EXTREMITY LYMPHEDEMA  Patient Name: Kelsey Terry MRN: [id number] DOB:1961/01/21, 62 y.o., female Today's Date: 05/16/23    END OF SESSION:   OT End of Session - 06/20/23 1428     Visit Number 19    Number of Visits 36    Date for OT Re-Evaluation 09/18/23    OT Start Time 0804    OT Stop Time 0904    OT Time Calculation (min) 60 min    Activity Tolerance Patient tolerated treatment well;No increased pain    Behavior During Therapy [facility] for tasks assessed/performed               Past Medical History:  Diagnosis Date   Allergies    Asthma    Blood dyscrasia    blood clot in right leg   CAD (coronary artery disease) 05/05/2019   CAD in native artery    a. NSTEMI 05/16/18: LHC 05/16/18 - ost-pLAD 95% s/p PCI/DES, mLAD 30%, ostD1 70%, ost-pLCx 30%, mRCA 90%, dRCA 20%; b. staged PCI/DES to [facility] 7/1   Complication of anesthesia    patient stated she had "some kind of reaction to anesthesia, "thought she was having a stroke" during her second cath procedure.   Diabetes mellitus without complication (HCC)    pre-diabetes   Diastolic dysfunction    a. TTE 05/16/18: EF 55-60%, no RWMA, Gr1DD, trivial AI, calcified mitral annulus, mild MR, mildly dilated LA   Dyspnea    with activity   Hypertension    Lymphedema    LLE   Pericarditis    a. ~ 2012 in [place], [place] s/p pericardiocentesis    Wears contact lenses    Past Surgical History:  Procedure Laterality Date   ABDOMINAL HYSTERECTOMY     CESAREAN SECTION     COLONOSCOPY WITH PROPOFOL N/A 08/13/2019   Procedure: COLONOSCOPY WITH BIOPSIES;  Surgeon: Midge Minium, MD;  Location: [facility];  Service: Endoscopy;  Laterality: N/A;   CORONARY STENT INTERVENTION N/A 05/16/2018   Procedure: CORONARY STENT INTERVENTION;  Surgeon: Iran Ouch, MD;  Location: [facility];  Service: Cardiovascular;  Laterality: N/A;   CORONARY STENT INTERVENTION N/A 05/19/2018    Procedure: CORONARY STENT INTERVENTION;  Surgeon: Iran Ouch, MD;  Location: [facility] INV[facility] CV [facility];  Service: Cardiovascular;  Laterality: N/A;   LEFT HEART CATH AND CORONARY ANGIOGRAPHY N/A 05/16/2018   Procedure: LEFT HEART CATH AND CORONARY ANGIOGRAPHY;  Surgeon: Iran Ouch, MD;  Location: [facility] INVASIVE CV LAB;  Service: Cardiovascular;  Laterality: N/A;   PERICARDIOCENTESIS     POLYPECTOMY N/A 08/13/2019   Procedure: POLYPECTOMY;  Surgeon: Midge Minium, MD;  Location: [facility];  Service: Endoscopy;  Laterality: N/A;   Patient Active Problem List   Diagnosis Date Noted   Cough 03/31/2022   Hyperlipidemia 03/06/2021   Familial hypercholesterolemia 08/23/2020   Acute bilateral knee pain 01/13/2020   Health care maintenance 11/24/2019   H/O elevated lipids 11/04/2019   Lymphedema 08/26/2019   Chronic venous insufficiency 08/26/2019   Encounter for screening colonoscopy    Polyp of sigmoid colon    Polyp of ascending colon    Lymphedema of left leg 06/16/2019   Prediabetes 06/16/2019   Abnormal laboratory test result 06/16/2019   Prediabetes 05/20/2019   Essential hypertension 05/19/2019   Abscess of left buttock 05/05/2019   Essential hypertension 05/05/2019   CAD (coronary artery disease) 05/05/2019   NSTEMI (non-ST elevated myocardial infarction) (HCC) 05/16/2018  PCP: Duanne Limerick, MD  REFERRING PROVIDER: Levora Dredge, MD  REFERRING DIAG: I89.0  THERAPY DIAG:  Lymphedema, not elsewhere classified  Rationale for Evaluation and Treatment: Rehabilitation  ONSET DATE: 03/14/23  SUBJECTIVE:                                                                                                                                                                                           SUBJECTIVE STATEMENT: Kelsey Terry presents for OT visit to address BLE lymphedema, L>R. Pt presents on time with clean wraps. Pt denies LE-related leg pain and has no new  complaints.  PERTINENT HISTORY: LE, CVI, HTN, Prediabetes, CAD, s/p NSTEMI 04/2028  PAIN: Are you having lymphedema -related  pain? No  PRECAUTIONS: Other: LYMPHEDEMA PRECAUTIONS: CARDIAC, DM- skin  FALLS: Has patient fallen since last visit? No  HAND DOMINANCE: right   PRIOR LEVEL OF FUNCTION: Independent  PATIENT GOALS: to be able to do something better to take care of my legs; to learn about any new management technology; to keep the lymphedema from getting worse."   OBJECTIVE:  OBSERVATIONS / OTHER ASSESSMENTS: Moderate, Stage  II, Primary, Bilateral Lower Extremity Lymphedema, L>R (Lymphedema [place]ecox)  FOTO functional outcome score: Intake 03/14/23: 81%  Lymphedema Life Impact Scale (LLIS) Intake 03/14/23: 20.59% (The extent to which lymphedema -related problems impacted your life in the past week)    BLE COMPARATIVE LIMB VOLUMETRICS Intake Visit 1 03/21/23  LANDMARK RIGHT    R LEG (A-D) 3627.8 ml  R THIGH (E-G) 6402.0 ml  R FULL LIMB (A-G) 10029.8 ml  Limb Volume differential (LVD)  %  Volume change since initial %  Volume change overall V  (Blank rows = not tested)  LANDMARK LEFT    L LEG (A-D) 4875.0 ml  L THIGH (E-G) 6129.6 ml  L  FULL LIMB (A-G) 11004.6 ml  Limb Volume differential (LVD)  Leg LVD = 25.6%, L>R Thigh LVD = 4.41%, R (dominant)>L Full Limb LVD = 8.85%, L>R%  Volume change since initial %  Volume change overall %  (Blank rows = not tested)   LLE COMPARATIVE LIMB VOLUMETRICS 9 th Rx Visit  05/03/23  LANDMARK LEFT    L LEG (A-D) 4609.5 ml  L THIGH (E-G) 6182.3 ml  L  FULL LIMB (A-G) 10791.9 ml  Limb Volume differential (LVD)  L LEG reduction = 5.45% since commencing CDT on 03/21/23.  L THIGH is essentially unchanged with INCREASED volume measuring 0.86%. Full LLE volume reduction measures 1.93% to date.  Volume change since initial %  Volume change overall %  (Blank rows = not tested)   LLE COMPARATIVE  LIMB VOLUMETRICS 18 th Rx Visit   06/18/23  LANDMARK LEFT    L LEG (A-D) 4544.6  ml  L THIGH (E-G) 6362.5 ml  L  FULL LIMB (A-G) 10907.2 ml  Limb Volume differential (LVD)  L LEG reduction = 1.4 % since last measured on 05/03/23.  L THIGH is INCREASED by 2.9% since 6/14; and full limb volume is increased overall by 2.9% since last measured.   Volume change since initial Since initially measured the L leg is reduced by 6.8%. The L thigh is increased by 3.8%. And the full limb volume is decreased by .9% since initially measuring volume on 03/21/23.  Volume change overall %    TODAY'S TREATMENT:  LLE comparative limb volumetrics LLE MLD Multilayer, knee length, LLE compression wrap                                                                                                                              PATIENT EDUCATION:  Continued Pt/ CG edu for lymphedema self care home program throughout session. Topics include outcome of comparative limb volumetrics- starting limb volume differentials (LVDs), technology and gradient techniques used for short stretch, multilayer compression wrapping, simple self-MLD, therapeutic lymphatic pumping exercises, skin/nail care, LE precautions,. compression garment recommendations and specifications, wear and care schedule and compression garment donning / doffing w assistive devices. Discussed progress towards all OT goals since commencing CDT. All questions answered to the Pt's satisfaction. Good return.  Person educated: Patient Education method: Explanation, Demonstration, and Handouts Education comprehension: verbalized understanding, returned demonstration, and needs further education  LE SELF-CARE HOME PROGRAM: BLE Lymphatic Pumping There ex Skin care to limit infection risk Compression Intensive stage compression: multilayer short stretch wraps with gradient techniques. One limb at a time. Self-management Phase Compression: Custom, flat knit, BLE compression knee highs. Consider Elvarex  classic for daytime and Jobst Relax for HOS Simple self-MLD LLE/LLQ MLD utilizing short neck sequence, deep, diaphragmatic breathing, stationary EJ strokes to functional inguinal LN at L groin, then dynamic EJ strokes from proximal to distal from thigh, knee, leg and foot, with 3 sweeps to terminus to complete session.   Custom-made gradient compression garments and HOS devices are medically necessary in this case because they are uniquely sized and shaped to fit the exact dimensions of the affected extremities with deformities, and to provide accurate and consistent gradient compression and containment, essential to optimally managing this patient's symptoms of chronic, progressive lymphedema. Multiple custom compression garments are needed for optimal hygiene to limit infection risk. Custom compression garments should be replaced q 3-6 months When worn consistently for optimal lipo-lymphedema self-management over time.  ASSESSMENT: CLINICAL IMPRESSION: Continued MLD with simultaneous skin care today with good response. Re applied gradient compression wraps as established. Pt fully engaged in all aspects of therapy.  Cont as per POC.  OBJECTIVE IMPAIRMENTS: decreased knowledge of condition, decreased knowledge of use of DME, decreased mobility, increased edema, pain, and chronic lower extremity  swelling and associated pain, lymphedema related skin changes ( decreased excursion, increased skin approximation) increased infection risk  ACTIVITY LIMITATIONS: sitting tolerance, standing tolerance, and functional ambulation. Basic and Instrumental ADLS ( fitting shoes, socks and lower body clothing, grooming nails, inspecting skin, performing skin care, driving, shopping, housework) leisure pursuits (walking for exercise), productive activities ( caring for others, work duties),   PARTICIPATION LIMITATIONS:  negative body image limits clothing selection and social participation  PERSONAL FACTORS: Time since  onset of onset/ level of progression; exacerbating co morbidities: : HTN, CVI   REHAB POTENTIAL: Good  EVALUATION COMPLEXITY: Moderate   GOALS: Goals reviewed with patient? Yes  SHORT TERM GOALS: Target date: 4th OT Rx visit  Pt will demonstrate understanding of lymphedema precautions and prevention strategies with modified independence using a printed reference to identify at least 5 precautions and discussing how s/he may implement them into daily life to reduce risk of progression with extra time. Baseline:Max A Goal status: 06/10/23 GOAL MET  2.  Pt will be able to apply multilayer, thigh length, gradient, compression wraps to one leg at a time with modified assistance (extra time) to decrease limb volume, to limit infection risk, and to limit lymphedema progression.  Baseline: Dependent Goal status: 05/07/23 GOAL MET  LONG TERM GOALS: Target date: 06/12/23  Given this patient's Intake score of 81/100% on the functional outcomes FOTO tool, patient will experience an increase in function of 3 points to improve basic and instrumental ADLs performance, including lymphedema self-care.  Baseline: 81% Goal status: 05/07/23 PROGRESSING  2.  Given this patient's Intake score of 20.59 % on the Lymphedema Life Impact Scale (LLIS), patient will experience a reduction of at least 5 points in her perceived level of functional impairment resulting from lymphedema to improve functional performance and quality of life (QOL). Baseline: 20.59% Goal status: 05/07/23 PROGRESSING  3.  During Intensive phase CDT Pt will achieve at least 85% compliance with all lymphedema self-care home program components, including daily skin care, compression wraps and /or garments, simple self MLD and lymphatic pumping therex to habituate LE self care protocol  into ADLs for optimal LE self-management over time. Baseline: Dependent Goal status:05/07/23 GOAL MET  4.  Pt will achieve at least a 10% volume reduction in the  BLE to return limb to typical size and shape, to limit infection risk and LE progression, to decrease pain, to improve function. Baseline: Dependent Goal status: 05/07/23 PROGRESSING: L LEG reduction = 5.45% since commencing CDT on 03/21/23. L THIGH is essentially unchanged with INCREASED volume measuring 0.86%.Full LLE volume reduction measures 1.93% to date.  06/18/23 PROGRESSING L LEG reduction = 1.4 % since last measured on 05/03/23.  L THIGH is INCREASED by 2.9% since 6/14; and full limb volume is increased overall by 2.9% since last measured. %.   Since initially measured the L leg is reduced by 6.8%. The L thigh is increased by 3.8%. And the full limb volume is decreased by .9% since initially measuring volume on 03/21/23.    5.  STG: Pt will obtain proper compression garments/devices and achieve modified independence (extra time + assistive devices) with donning/doffing to optimize limb volume reductions and limit LE  progression over time. Baseline: Max A Goal status: 05/07/23 PROGRESSING  PLAN:  PT FREQUENCY: 2x/weekand PRN  PT DURATION: other: and PRN  PLANNED INTERVENTIONS: Complete Decongestive Therapy (CDT): Therapeutic lymphatic pumping exercises, skin care simultaneously w MLD to limit infection risk, Manual lymph drainage (MLD), During Intensive Phase- multilayer Compression  bandaging; during self-management phase formulate appropriate compression garments and devices, complete anatomical measurements, fitting and assessment t of garments; Consider trial of Flexitouch ADVANCED sequential compression device as replacement for incorrect basic vaso pneumatic device. Patient/Family education, DME instructions,    PLAN FOR NEXT SESSION:  Review progress towards goals and plans to progress Continue MLD to LLE/LLQ. Cont LLE knee length multilayer compression wrap   Loel Dubonnet, Kelsey, OTR/L, CLT-LANA 06/20/23 2:31 PM

## 2023-06-25 ENCOUNTER — Ambulatory Visit: Payer: 59 | Admitting: Occupational Therapy

## 2023-06-25 ENCOUNTER — Ambulatory Visit
Admission: RE | Admit: 2023-06-25 | Discharge: 2023-06-25 | Disposition: A | Discharge status: Home / Self Care | Payer: 59 | Source: Ambulatory Visit | Attending: Obstetrics and Gynecology | Admitting: Obstetrics and Gynecology

## 2023-06-25 DIAGNOSIS — Z1231 Encounter for screening mammogram for malignant neoplasm of breast: Secondary | ICD-10-CM | POA: Diagnosis not present

## 2023-06-26 ENCOUNTER — Ambulatory Visit: Payer: 59 | Admitting: Occupational Therapy

## 2023-06-27 ENCOUNTER — Ambulatory Visit: Payer: 59 | Admitting: Occupational Therapy

## 2023-06-28 DIAGNOSIS — H401122 Primary open-angle glaucoma, left eye, moderate stage: Secondary | ICD-10-CM | POA: Diagnosis not present

## 2023-06-28 DIAGNOSIS — H401112 Primary open-angle glaucoma, right eye, moderate stage: Secondary | ICD-10-CM | POA: Diagnosis not present

## 2023-07-02 ENCOUNTER — Encounter: Payer: Self-pay | Admitting: Occupational Therapy

## 2023-07-02 ENCOUNTER — Ambulatory Visit: Payer: 59 | Admitting: Occupational Therapy

## 2023-07-02 DIAGNOSIS — I890 Lymphedema, not elsewhere classified: Secondary | ICD-10-CM | POA: Diagnosis not present

## 2023-07-02 NOTE — Therapy (Signed)
OUTPATIENT OCCUPATIONAL THERAPY TREATMENT NOTE AND PROGRESS REPORT  LOWER EXTREMITY LYMPHEDEMA  Patient Name: Kelsey Terry MRN: [id number] DOB:September 18, 1961, 62 y.o., female Today's Date: 05/16/23  REPORTING PERIOD: 05/16/23 - 07/02/23  END OF SESSION:   OT End of Session - 07/02/23 0803     Visit Number 20    Number of Visits 36    Date for OT Re-Evaluation 09/18/23    OT Start Time 0800    OT Stop Time 0905    OT Time Calculation (min) 65 min    Activity Tolerance Patient tolerated treatment well;No increased pain    Behavior During Therapy [facility] for tasks assessed/performed               Past Medical History:  Diagnosis Date   Allergies    Asthma    Blood dyscrasia    blood clot in right leg   CAD (coronary artery disease) 05/05/2019   CAD in native artery    a. NSTEMI 05/16/18: LHC 05/16/18 - ost-pLAD 95% s/p PCI/DES, mLAD 30%, ostD1 70%, ost-pLCx 30%, mRCA 90%, dRCA 20%; b. staged PCI/DES to [facility] 7/1   Complication of anesthesia    patient stated she had "some kind of reaction to anesthesia, "thought she was having a stroke" during her second cath procedure.   Diabetes mellitus without complication (HCC)    pre-diabetes   Diastolic dysfunction    a. TTE 05/16/18: EF 55-60%, no RWMA, Gr1DD, trivial AI, calcified mitral annulus, mild MR, mildly dilated LA   Dyspnea    with activity   Hypertension    Lymphedema    LLE   Pericarditis    a. ~ 2012 in [place], [place] s/p pericardiocentesis    Wears contact lenses    Past Surgical History:  Procedure Laterality Date   ABDOMINAL HYSTERECTOMY     CESAREAN SECTION     COLONOSCOPY WITH PROPOFOL N/A 08/13/2019   Procedure: COLONOSCOPY WITH BIOPSIES;  Surgeon: Midge Minium, MD;  Location: [facility];  Service: Endoscopy;  Laterality: N/A;   CORONARY STENT INTERVENTION N/A 05/16/2018   Procedure: CORONARY STENT INTERVENTION;  Surgeon: Iran Ouch, MD;  Location: [facility];  Service: Cardiovascular;   Laterality: N/A;   CORONARY STENT INTERVENTION N/A 05/19/2018   Procedure: CORONARY STENT INTERVENTION;  Surgeon: Iran Ouch, MD;  Location: [facility] INVASIVE CV [facility];  Service: Cardiovascular;  Laterality: N/A;   LEFT HEART CATH AND CORONARY ANGIOGRAPHY N/A 05/16/2018   Procedure: LEFT HEART CATH AND CORONARY ANGIOGRAPHY;  Surgeon: Iran Ouch, MD;  Location: [facility] INVASIVE CV LAB;  Service: Cardiovascular;  Laterality: N/A;   PERICARDIOCENTESIS     POLYPECTOMY N/A 08/13/2019   Procedure: POLYPECTOMY;  Surgeon: Midge Minium, MD;  Location: [facility];  Service: Endoscopy;  Laterality: N/A;   Patient Active Problem List   Diagnosis Date Noted   Cough 03/31/2022   Hyperlipidemia 03/06/2021   Familial hypercholesterolemia 08/23/2020   Acute bilateral knee pain 01/13/2020   Health care maintenance 11/24/2019   H/O elevated lipids 11/04/2019   Lymphedema 08/26/2019   Chronic venous insufficiency 08/26/2019   Encounter for screening colonoscopy    Polyp of sigmoid colon    Polyp of ascending colon    Lymphedema of left leg 06/16/2019   Prediabetes 06/16/2019   Abnormal laboratory test result 06/16/2019   Prediabetes 05/20/2019   Essential hypertension 05/19/2019   Abscess of left buttock 05/05/2019   Essential hypertension 05/05/2019   CAD (coronary artery disease) 05/05/2019  NSTEMI (non-ST elevated myocardial infarction) (HCC) 05/16/2018    PCP: Duanne Limerick, MD  REFERRING PROVIDER: Levora Dredge, MD  REFERRING DIAG: I89.0  THERAPY DIAG:  Lymphedema, not elsewhere classified  Rationale for Evaluation and Treatment: Rehabilitation  ONSET DATE: 03/14/23  SUBJECTIVE:                                                                                                                                                                                           SUBJECTIVE STATEMENT: Ms Economy presents for OT visit to address BLE lymphedema, L>R without wraps in place. She  brings clean, rolled wraps in a bag for application after manual therapy.  When asked Pt states her L leg has been unwrapped for the last 2 days because she had a lot to do. Pt denies LE-related leg pain and has no new complaints this morning.  PERTINENT HISTORY: LE, CVI, HTN, Prediabetes, CAD, s/p NSTEMI 04/2028  PAIN: Are you having lymphedema -related  pain? No  PRECAUTIONS: Other: LYMPHEDEMA PRECAUTIONS: CARDIAC, DM- skin  FALLS: Has patient fallen since last visit? No  HAND DOMINANCE: right   PRIOR LEVEL OF FUNCTION: Independent  PATIENT GOALS: to be able to do something better to take care of my legs; to learn about any new management technology; to keep the lymphedema from getting worse."   OBJECTIVE:  OBSERVATIONS / OTHER ASSESSMENTS: Moderate, Stage  II, Primary, Bilateral Lower Extremity Lymphedema, L>R (Lymphedema Praecox)  FOTO functional outcome score: Intake 03/14/23: 81%  Lymphedema Life Impact Scale (LLIS) Intake 03/14/23: 20.59% (The extent to which lymphedema -related problems impacted your life in the past week)    BLE COMPARATIVE LIMB VOLUMETRICS Intake Visit 1 03/21/23  LANDMARK RIGHT    R LEG (A-D) 3627.8 ml  R THIGH (E-G) 6402.0 ml  R FULL LIMB (A-G) 10029.8 ml  Limb Volume differential (LVD)  %  Volume change since initial %  Volume change overall V  (Blank rows = not tested)  LANDMARK LEFT    L LEG (A-D) 4875.0 ml  L THIGH (E-G) 6129.6 ml  L  FULL LIMB (A-G) 11004.6 ml  Limb Volume differential (LVD)  Leg LVD = 25.6%, L>R Thigh LVD = 4.41%, R (dominant)>L Full Limb LVD = 8.85%, L>R%  Volume change since initial %  Volume change overall %  (Blank rows = not tested)   LLE COMPARATIVE LIMB VOLUMETRICS 9 th Rx Visit  05/03/23  LANDMARK LEFT    L LEG (A-D) 4609.5 ml  L THIGH (E-G) 6182.3 ml  L  FULL LIMB (A-G) 10791.9 ml  Limb Volume differential (LVD)  L LEG reduction = 5.45%  since commencing CDT on 03/21/23.  L THIGH is essentially unchanged  with INCREASED volume measuring 0.86%. Full LLE volume reduction measures 1.93% to date.  Volume change since initial %  Volume change overall %  (Blank rows = not tested)   LLE COMPARATIVE LIMB VOLUMETRICS 18 th Rx Visit  06/18/23  LANDMARK LEFT    L LEG (A-D) 4544.6  ml  L THIGH (E-G) 6362.5 ml  L  FULL LIMB (A-G) 10907.2 ml  Limb Volume change since last measured on 04/23/23 L LEG reduction = 1.4 % since last measured on 05/03/23.  L THIGH is INCREASED by 2.9% since 6/14; and full limb volume is increased overall by 2.9% since last measured.   Volume change since initial Since initially measured the L leg is reduced by 6.8%. The L thigh is increased by 3.8%. And the full limb volume is decreased by .9% since initially measuring volume on 03/21/23.  Volume change overall %     TODAY'S TREATMENT:  Pt edu- reviewed progress towards goals and discussed new short term goal LLE MLD Multilayer, knee length, LLE compression wrap                                                                                                                              PATIENT EDUCATION:  Continued Pt/ CG edu for lymphedema self care home program throughout session. Topics include outcome of comparative limb volumetrics- starting limb volume differentials (LVDs), technology and gradient techniques used for short stretch, multilayer compression wrapping, simple self-MLD, therapeutic lymphatic pumping exercises, skin/nail care, LE precautions,. compression garment recommendations and specifications, wear and care schedule and compression garment donning / doffing w assistive devices. Discussed progress towards all OT goals since commencing CDT. All questions answered to the Pt's satisfaction. Good return.  Person educated: Patient Education method: Explanation, Demonstration, and Handouts Education comprehension: verbalized understanding, returned demonstration, and needs further education  LE SELF-CARE HOME  PROGRAM: BLE Lymphatic Pumping There ex Skin care to limit infection risk Compression Intensive stage compression: multilayer short stretch wraps with gradient techniques. One limb at a time. Self-management Phase Compression: Custom, flat knit, BLE compression knee highs. Consider Elvarex classic for daytime and Jobst Relax for HOS Simple self-MLD LLE/LLQ MLD utilizing short neck sequence, deep, diaphragmatic breathing, stationary EJ strokes to functional inguinal LN at L groin, then dynamic EJ strokes from proximal to distal from thigh, knee, leg and foot, with 3 sweeps to terminus to complete session.   Custom-made gradient compression garments and HOS devices are medically necessary in this case because they are uniquely sized and shaped to fit the exact dimensions of the affected extremities with deformities, and to provide accurate and consistent gradient compression and containment, essential to optimally managing this patient's symptoms of chronic, progressive lymphedema. Multiple custom compression garments are needed for optimal hygiene to limit infection risk. Custom compression garments should be replaced q 3-6 months When worn consistently for optimal lipo-lymphedema self-management over  time.  ASSESSMENT: CLINICAL IMPRESSION: Provided MLD with simultaneous skin care throughout session while also reviewing goals and discussing plan going forward. It's obvious that Pt is not consistently applying short stretch wraps on a daily basis between sessions evidenced by the increased leg swelling and tissue density palpated today. Atypically we opted not to perform volumetrics today as limb volume of the L leg is definitely increased since last see and last measured on 06/20/23. I would guess compliance with consistent daily compression has decreased significantly from goal range 85%. Pt verbalized understanding that without consistent daily compression lymphedema management will not be successful and  her condition will progress. Pt made a new goal today to apply daily, knee length, LLE compression wraps as instructed 100% of the time until next OT visit in 2 days. Please see GOALS section for additional details re progress to date.   Applied gradient compression wraps as established.Cont as per POC.  OBJECTIVE IMPAIRMENTS: decreased knowledge of condition, decreased knowledge of use of DME, decreased mobility, increased edema, pain, and chronic lower extremity swelling and associated pain, lymphedema related skin changes ( decreased excursion, increased skin approximation) increased infection risk  ACTIVITY LIMITATIONS: sitting tolerance, standing tolerance, and functional ambulation. Basic and Instrumental ADLS ( fitting shoes, socks and lower body clothing, grooming nails, inspecting skin, performing skin care, driving, shopping, housework) leisure pursuits (walking for exercise), productive activities ( caring for others, work duties),   PARTICIPATION LIMITATIONS:  negative body image limits clothing selection and social participation  PERSONAL FACTORS: Time since onset of onset/ level of progression; exacerbating co morbidities: : HTN, CVI   REHAB POTENTIAL: Good  EVALUATION COMPLEXITY: Moderate   GOALS: Goals reviewed with patient? Yes  SHORT TERM GOALS: Target date: 4th OT Rx visit  Pt will demonstrate understanding of lymphedema precautions and prevention strategies with modified independence using a printed reference to identify at least 5 precautions and discussing how s/he may implement them into daily life to reduce risk of progression with extra time. Baseline:Max A Goal status: 06/10/23 GOAL MET  2.  Pt will be able to apply multilayer, thigh length, gradient, compression wraps to one leg at a time with modified assistance (extra time) to decrease limb volume, to limit infection risk, and to limit lymphedema progression.  Baseline: Dependent Goal status: 05/07/23 GOAL  MET  LONG TERM GOALS: Target date:   Given this patient's Intake score of 81/100% on the functional outcomes FOTO tool, patient will experience an increase in function of 3 points to improve basic and instrumental ADLs performance, including lymphedema self-care.  Baseline: 81% Goal status: 07/02/23 PROGRESSING  2.  Given this patient's Intake score of 20.59 % on the Lymphedema Life Impact Scale (LLIS), patient will experience a reduction of at least 5 points in her perceived level of functional impairment resulting from lymphedema to improve functional performance and quality of life (QOL). Baseline: 20.59% Goal status:07/02/23 PROGRESSING  3.  During Intensive phase CDT Pt will achieve at least 85% compliance with all lymphedema self-care home program components, including daily skin care, compression wraps and /or garments, simple self MLD and lymphatic pumping therex to habituate LE self care protocol  into ADLs for optimal LE self-management over time. Baseline: Dependent Goal status:07/02/23 PROGRESSING. Pt's reports 90% compliance with daily self-bandaging LLE is not in keeping with condition of limb swelling and tissue density palpable today. Pt created a new short term objective toward building compliance with the compression aspect of LE self care.  3a. Between this  date, 8/13, and upcoming visit later this week, 8/15, Pt will achieve 100% compliance with daily compression bandaging to limit LE progression.  4.  Pt will achieve at least a 10% volume reduction in the BLE to return limb to typical size and shape, to limit infection risk and LE progression, to decrease pain, to improve function. Baseline: Dependent Goal status: 05/07/23 PROGRESSING: L LEG reduction = 5.45% since commencing CDT on 03/21/23. L THIGH is essentially unchanged with INCREASED volume measuring 0.86%.Full LLE volume reduction measures 1.93% to date.  07/02/23 PROGRESSING L LEG reduction = 1.4 % since last measured on  05/03/23.  L THIGH is INCREASED by 2.9% since 6/14; and full limb volume is increased overall by 2.9% since last measured. %.   Since initially measured the L leg has reduced by 6.8%. The L thigh is increased by 3.8%. And the full limb volume is decreased by .9% since initially measuring volume on 03/21/23.    5.  STG: Pt will obtain proper compression garments/devices and achieve modified independence (extra time + assistive devices) with donning/doffing to optimize limb volume reductions and limit LE  progression over time. Baseline: Max A Goal status: 07/02/23 PROGRESSING  PLAN:  PT FREQUENCY: 2x/weekand PRN  PT DURATION: other: and PRN  PLANNED INTERVENTIONS: Complete Decongestive Therapy (CDT): Therapeutic lymphatic pumping exercises, skin care simultaneously w MLD to limit infection risk, Manual lymph drainage (MLD), During Intensive Phase- multilayer Compression bandaging; during self-management phase formulate appropriate compression garments and devices, complete anatomical measurements, fitting and assessment t of garments; Consider trial of Flexitouch ADVANCED sequential compression device as replacement for incorrect basic vaso pneumatic device. Patient/Family education, DME instructions,    PLAN FOR NEXT SESSION:  Review progress towards goals and plans to progress Continue MLD  and skin care to LLE/LLQ. Cont LLE knee length multilayer compression wrap   Loel Dubonnet, MS, OTR/L, CLT-LANA 07/02/23 9:12 AM

## 2023-07-04 ENCOUNTER — Ambulatory Visit: Payer: 59 | Admitting: Occupational Therapy

## 2023-07-04 DIAGNOSIS — I890 Lymphedema, not elsewhere classified: Secondary | ICD-10-CM

## 2023-07-04 NOTE — Therapy (Signed)
OUTPATIENT OCCUPATIONAL THERAPY TREATMENT NOTE   LOWER EXTREMITY LYMPHEDEMA  Patient Name: Kelsey Terry MRN: [id number] DOB:[birth year], 62 y.o., female Today's Date: 05/16/23    END OF SESSION:   OT End of Session - 07/04/23 0802     Visit Number 21    Number of Visits 36    Date for OT Re-Evaluation 09/18/23    OT Start Time 0802    Activity Tolerance Patient tolerated treatment well;No increased pain    Behavior During Therapy [facility] for tasks assessed/performed               Past Medical History:  Diagnosis Date   Allergies    Asthma    Blood dyscrasia    blood clot in right leg   CAD (coronary artery disease) 05/05/2019   CAD in native artery    a. NSTEMI 05/16/18: LHC 05/16/18 - ost-pLAD 95% s/p PCI/DES, mLAD 30%, ostD1 70%, ost-pLCx 30%, mRCA 90%, dRCA 20%; b. staged PCI/DES to [facility] 7/1   Complication of anesthesia    patient stated she had "some kind of reaction to anesthesia, "thought she was having a stroke" during her second cath procedure.   Diabetes mellitus without complication (HCC)    pre-diabetes   Diastolic dysfunction    a. TTE 05/16/18: EF 55-60%, no RWMA, Gr1DD, trivial AI, calcified mitral annulus, mild MR, mildly dilated LA   Dyspnea    with activity   Hypertension    Lymphedema    LLE   Pericarditis    a. ~ 2012 in [place], [place] s/p pericardiocentesis    Wears contact lenses    Past Surgical History:  Procedure Laterality Date   ABDOMINAL HYSTERECTOMY     CESAREAN SECTION     COLONOSCOPY WITH PROPOFOL N/A 08/13/2019   Procedure: COLONOSCOPY WITH BIOPSIES;  Surgeon: Midge Minium, MD;  Location: [facility];  Service: Endoscopy;  Laterality: N/A;   CORONARY STENT INTERVENTION N/A 05/16/2018   Procedure: CORONARY STENT INTERVENTION;  Surgeon: Iran Ouch, MD;  Location: [facility] INV[facility];  Service: Cardiovascular;  Laterality: N/A;   CORONARY STENT INTERVENTION N/A 05/19/2018   Procedure: CORONARY STENT INTERVENTION;  Surgeon:  Iran Ouch, MD;  Location: [facility] INVASIVE CV [facility];  Service: Cardiovascular;  Laterality: N/A;   LEFT HEART CATH AND CORONARY ANGIOGRAPHY N/A 05/16/2018   Procedure: LEFT HEART CATH AND CORONARY ANGIOGRAPHY;  Surgeon: Iran Ouch, MD;  Location: [facility] INVASIVE CV LAB;  Service: Cardiovascular;  Laterality: N/A;   PERICARDIOCENTESIS     POLYPECTOMY N/A 08/13/2019   Procedure: POLYPECTOMY;  Surgeon: Midge Minium, MD;  Location: [facility];  Service: Endoscopy;  Laterality: N/A;   Patient Active Problem List   Diagnosis Date Noted   Cough 03/31/2022   Hyperlipidemia 03/06/2021   Familial hypercholesterolemia 08/23/2020   Acute bilateral knee pain 01/13/2020   Health care maintenance 11/24/2019   H/O elevated lipids 11/04/2019   Lymphedema 08/26/2019   Chronic venous insufficiency 08/26/2019   Encounter for screening colonoscopy    Polyp of sigmoid colon    Polyp of ascending colon    Lymphedema of left leg 06/16/2019   Prediabetes 06/16/2019   Abnormal laboratory test result 06/16/2019   Prediabetes 05/20/2019   Essential hypertension 05/19/2019   Abscess of left buttock 05/05/2019   Essential hypertension 05/05/2019   CAD (coronary artery disease) 05/05/2019   NSTEMI (non-ST elevated myocardial infarction) (HCC) 05/16/2018    PCP: Duanne Limerick, MD  REFERRING PROVIDER: Levora Dredge, MD  REFERRING DIAG: I89.0  THERAPY DIAG:  Lymphedema, not elsewhere classified  Rationale for Evaluation and Treatment: Rehabilitation  ONSET DATE: 03/14/23  SUBJECTIVE:                                                                                                                                                                                           SUBJECTIVE STATEMENT: Ms Barientos presents for OT visit to address BLE lymphedema, L>R without wraps in place. She brings clean, rolled wraps in a bag for application after manual therapy.  When asked  Pt denies LE-related leg  pain and has no new complaints this morning. Pt states "wrapping is going good".  PERTINENT HISTORY: LE, CVI, HTN, Prediabetes, CAD, s/p NSTEMI 04/2028  PAIN: Are you having lymphedema -related  pain? No  PRECAUTIONS: Other: LYMPHEDEMA PRECAUTIONS: CARDIAC, DM- skin  FALLS: Has patient fallen since last visit? No  HAND DOMINANCE: right   PRIOR LEVEL OF FUNCTION: Independent  PATIENT GOALS: to be able to do something better to take care of my legs; to learn about any new management technology; to keep the lymphedema from getting worse."   OBJECTIVE:  OBSERVATIONS / OTHER ASSESSMENTS: Moderate, Stage  II, Primary, Bilateral Lower Extremity Lymphedema, L>R (Lymphedema [place]ox)  FOTO functional outcome score: Intake 03/14/23: 81%  Lymphedema Life Impact Scale (LLIS) Intake 03/14/23: 20.59% (The extent to which lymphedema -related problems impacted your life in the past week)    BLE COMPARATIVE LIMB VOLUMETRICS Intake Visit 1 03/21/23  LANDMARK RIGHT    R LEG (A-D) 3627.8 ml  R THIGH (E-G) 6402.0 ml  R FULL LIMB (A-G) 10029.8 ml  Limb Volume differential (LVD)  %  Volume change since initial %  Volume change overall V  (Blank rows = not tested)  LANDMARK LEFT    L LEG (A-D) 4875.0 ml  L THIGH (E-G) 6129.6 ml  L  FULL LIMB (A-G) 11004.6 ml  Limb Volume differential (LVD)  Leg LVD = 25.6%, L>R Thigh LVD = 4.41%, R (dominant)>L Full Limb LVD = 8.85%, L>R%  Volume change since initial %  Volume change overall %  (Blank rows = not tested)   LLE COMPARATIVE LIMB VOLUMETRICS 9 th Rx Visit  05/03/23  LANDMARK LEFT    L LEG (A-D) 4609.5 ml  L THIGH (E-G) 6182.3 ml  L  FULL LIMB (A-G) 10791.9 ml  Limb Volume differential (LVD)  L LEG reduction = 5.45% since commencing CDT on 03/21/23.  L THIGH is essentially unchanged with INCREASED volume measuring 0.86%. Full LLE volume reduction measures 1.93% to date.  Volume change since initial %  Volume change overall %  (  Blank rows =  not tested)   LLE COMPARATIVE LIMB VOLUMETRICS 18 th Rx Visit  06/18/23  LANDMARK LEFT    L LEG (A-D) 4544.6  ml  L THIGH (E-G) 6362.5 ml  L  FULL LIMB (A-G) 10907.2 ml  Limb Volume change since last measured on 04/23/23 L LEG reduction = 1.4 % since last measured on 05/03/23.  L THIGH is INCREASED by 2.9% since 6/14; and full limb volume is increased overall by 2.9% since last measured.   Volume change since initial Since initially measured the L leg is reduced by 6.8%. The L thigh is increased by 3.8%. And the full limb volume is decreased by .9% since initially measuring volume on 03/21/23.  Volume change overall %     TODAY'S TREATMENT:  LLE MLD Multilayer, knee length, LLE compression wrap                                                                                                                              PATIENT EDUCATION:  Continued Pt/ CG edu for lymphedema self care home program throughout session. Topics include outcome of comparative limb volumetrics- starting limb volume differentials (LVDs), technology and gradient techniques used for short stretch, multilayer compression wrapping, simple self-MLD, therapeutic lymphatic pumping exercises, skin/nail care, LE precautions,. compression garment recommendations and specifications, wear and care schedule and compression garment donning / doffing w assistive devices. Discussed progress towards all OT goals since commencing CDT. All questions answered to the Pt's satisfaction. Good return.  Person educated: Patient Education method: Explanation, Demonstration, and Handouts Education comprehension: verbalized understanding, returned demonstration, and needs further education  LE SELF-CARE HOME PROGRAM: BLE Lymphatic Pumping There ex Skin care to limit infection risk Compression Intensive stage compression: multilayer short stretch wraps with gradient techniques. One limb at a time. Self-management Phase Compression: Custom, flat  knit, BLE compression knee highs. Consider Elvarex classic for daytime and Jobst Relax for HOS Simple self-MLD LLE/LLQ MLD utilizing short neck sequence, deep, diaphragmatic breathing, stationary EJ strokes to functional inguinal LN at L groin, then dynamic EJ strokes from proximal to distal from thigh, knee, leg and foot, with 3 sweeps to terminus to complete session.   Custom-made gradient compression garments and HOS devices are medically necessary in this case because they are uniquely sized and shaped to fit the exact dimensions of the affected extremities with deformities, and to provide accurate and consistent gradient compression and containment, essential to optimally managing this patient's symptoms of chronic, progressive lymphedema. Multiple custom compression garments are needed for optimal hygiene to limit infection risk. Custom compression garments should be replaced q 3-6 months When worn consistently for optimal lipo-lymphedema self-management over time.  ASSESSMENT: CLINICAL IMPRESSION: Provided MLD with simultaneous skin care throughout session while also reviewing goals and discussing plan going forward. Pt made a new goal last visit that she would applymultil;ayer compression wraps daily during interval and she reports she did achieve that goal. Pt agrees  to extend thre  goal to the next visit, and we'll keeop working on that as a short term objective until we can report at least 85% compliance  with that aspect of LE self care.  Applied gradient compression wraps as established.Cont as per POC.  OBJECTIVE IMPAIRMENTS: decreased knowledge of condition, decreased knowledge of use of DME, decreased mobility, increased edema, pain, and chronic lower extremity swelling and associated pain, lymphedema related skin changes ( decreased excursion, increased skin approximation) increased infection risk  ACTIVITY LIMITATIONS: sitting tolerance, standing tolerance, and functional ambulation.  Basic and Instrumental ADLS ( fitting shoes, socks and lower body clothing, grooming nails, inspecting skin, performing skin care, driving, shopping, housework) leisure pursuits (walking for exercise), productive activities ( caring for others, work duties),   PARTICIPATION LIMITATIONS:  negative body image limits clothing selection and social participation  PERSONAL FACTORS: Time since onset of onset/ level of progression; exacerbating co morbidities: : HTN, CVI   REHAB POTENTIAL: Good  EVALUATION COMPLEXITY: Moderate   GOALS: Goals reviewed with patient? Yes  SHORT TERM GOALS: Target date: 4th OT Rx visit  Pt will demonstrate understanding of lymphedema precautions and prevention strategies with modified independence using a printed reference to identify at least 5 precautions and discussing how s/he may implement them into daily life to reduce risk of progression with extra time. Baseline:Max A Goal status: 06/10/23 GOAL MET  2.  Pt will be able to apply multilayer, thigh length, gradient, compression wraps to one leg at a time with modified assistance (extra time) to decrease limb volume, to limit infection risk, and to limit lymphedema progression.  Baseline: Dependent Goal status: 05/07/23 GOAL MET  LONG TERM GOALS: Target date:   Given this patient's Intake score of 81/100% on the functional outcomes FOTO tool, patient will experience an increase in function of 3 points to improve basic and instrumental ADLs performance, including lymphedema self-care.  Baseline: 81% Goal status: 07/02/23 PROGRESSING  2.  Given this patient's Intake score of 20.59 % on the Lymphedema Life Impact Scale (LLIS), patient will experience a reduction of at least 5 points in her perceived level of functional impairment resulting from lymphedema to improve functional performance and quality of life (QOL). Baseline: 20.59% Goal status:07/02/23 PROGRESSING  3.  During Intensive phase CDT Pt will achieve at  least 85% compliance with all lymphedema self-care home program components, including daily skin care, compression wraps and /or garments, simple self MLD and lymphatic pumping therex to habituate LE self care protocol  into ADLs for optimal LE self-management over time. Baseline: Dependent Goal status:07/02/23 PROGRESSING. Pt's reports 90% compliance with daily self-bandaging LLE is not in keeping with condition of limb swelling and tissue density palpable today. Pt created a new short term objective toward building compliance with the compression aspect of LE self care.  3a. Between this date, 8/13, and upcoming visit later this week, 8/15, Pt will achieve 100% compliance with daily compression bandaging to limit LE progression. 07/04/23 MET  4.  Pt will achieve at least a 10% volume reduction in the BLE to return limb to typical size and shape, to limit infection risk and LE progression, to decrease pain, to improve function. Baseline: Dependent Goal status: 05/07/23 PROGRESSING: L LEG reduction = 5.45% since commencing CDT on 03/21/23. L THIGH is essentially unchanged with INCREASED volume measuring 0.86%.Full LLE volume reduction measures 1.93% to date.  07/02/23 PROGRESSING L LEG reduction = 1.4 % since last measured on 05/03/23.  L THIGH is INCREASED by  2.9% since 6/14; and full limb volume is increased overall by 2.9% since last measured. %.   Since initially measured the L leg has reduced by 6.8%. The L thigh is increased by 3.8%. And the full limb volume is decreased by .9% since initially measuring volume on 03/21/23.    5.  STG: Pt will obtain proper compression garments/devices and achieve modified independence (extra time + assistive devices) with donning/doffing to optimize limb volume reductions and limit LE  progression over time. Baseline: Max A Goal status: 07/02/23 PROGRESSING  PLAN:  PT FREQUENCY: 2x/weekand PRN  PT DURATION: other: and PRN  PLANNED INTERVENTIONS: Complete  Decongestive Therapy (CDT): Therapeutic lymphatic pumping exercises, skin care simultaneously w MLD to limit infection risk, Manual lymph drainage (MLD), During Intensive Phase- multilayer Compression bandaging; during self-management phase formulate appropriate compression garments and devices, complete anatomical measurements, fitting and assessment t of garments; Consider trial of Flexitouch ADVANCED sequential compression device as replacement for incorrect basic vaso pneumatic device. Patient/Family education, DME instructions,    PLAN FOR NEXT SESSION:  Review progress towards goals and plans to progress Continue MLD  and skin care to LLE/LLQ. Cont LLE knee length multilayer compression wrap   Loel Dubonnet, MS, OTR/L, CLT-LANA 07/04/23 8:03 AM

## 2023-07-08 ENCOUNTER — Ambulatory Visit: Payer: 59 | Admitting: Occupational Therapy

## 2023-07-09 ENCOUNTER — Encounter: Payer: 59 | Admitting: Occupational Therapy

## 2023-07-10 ENCOUNTER — Encounter: Payer: Self-pay | Admitting: Occupational Therapy

## 2023-07-10 ENCOUNTER — Ambulatory Visit: Payer: 59 | Admitting: Occupational Therapy

## 2023-07-10 DIAGNOSIS — I890 Lymphedema, not elsewhere classified: Secondary | ICD-10-CM

## 2023-07-10 NOTE — Therapy (Signed)
OUTPATIENT OCCUPATIONAL THERAPY TREATMENT NOTE   LOWER EXTREMITY LYMPHEDEMA  Patient Name: Sharekia Plymire MRN: [id number] DOB:1961/11/04, 62 y.o., female Today's Date: 05/16/23    END OF SESSION:   OT End of Session - 07/10/23 0903     Visit Number 22    Number of Visits 36    Date for OT Re-Evaluation 09/18/23    OT Start Time 0903    OT Stop Time 1003    OT Time Calculation (min) 60 min    Activity Tolerance Patient tolerated treatment well;No increased pain    Behavior During Therapy [facility] for tasks assessed/performed               Past Medical History:  Diagnosis Date   Allergies    Asthma    Blood dyscrasia    blood clot in right leg   CAD (coronary artery disease) 05/05/2019   CAD in native artery    a. NSTEMI 05/16/18: LHC 05/16/18 - ost-pLAD 95% s/p PCI/DES, mLAD 30%, ostD1 70%, ost-pLCx 30%, mRCA 90%, dRCA 20%; b. staged PCI/DES to [facility] 7/1   Complication of anesthesia    patient stated she had "some kind of reaction to anesthesia, "thought she was having a stroke" during her second cath procedure.   Diabetes mellitus without complication (HCC)    pre-diabetes   Diastolic dysfunction    a. TTE 05/16/18: EF 55-60%, no RWMA, Gr1DD, trivial AI, calcified mitral annulus, mild MR, mildly dilated LA   Dyspnea    with activity   Hypertension    Lymphedema    LLE   Pericarditis    a. ~ 2012 in [place], [place] s/p pericardiocentesis    Wears contact lenses    Past Surgical History:  Procedure Laterality Date   ABDOMINAL HYSTERECTOMY     CESAREAN SECTION     COLONOSCOPY WITH PROPOFOL N/A 08/13/2019   Procedure: COLONOSCOPY WITH BIOPSIES;  Surgeon: Midge Minium, MD;  Location: [facility];  Service: Endoscopy;  Laterality: N/A;   CORONARY STENT INTERVENTION N/A 05/16/2018   Procedure: CORONARY STENT INTERVENTION;  Surgeon: Iran Ouch, MD;  Location: [facility] INVASIVE CV LAB;  Service: Cardiovascular;  Laterality: N/A;   CORONARY STENT INTERVENTION N/A  05/19/2018   Procedure: CORONARY STENT INTERVENTION;  Surgeon: Iran Ouch, MD;  Location: [facility] INVASIVE CV LAB;  Service: Cardiovascular;  Laterality: N/A;   LEFT HEART CATH AND CORONARY ANGIOGRAPHY N/A 05/16/2018   Procedure: LEFT HEART CATH AND CORONARY ANGIOGRAPHY;  Surgeon: Iran Ouch, MD;  Location: [facility] INVASIVE CV LAB;  Service: Cardiovascular;  Laterality: N/A;   PERICARDIOCENTESIS     POLYPECTOMY N/A 08/13/2019   Procedure: POLYPECTOMY;  Surgeon: Midge Minium, MD;  Location: [facility];  Service: Endoscopy;  Laterality: N/A;   Patient Active Problem List   Diagnosis Date Noted   Cough 03/31/2022   Hyperlipidemia 03/06/2021   Familial hypercholesterolemia 08/23/2020   Acute bilateral knee pain 01/13/2020   Health care maintenance 11/24/2019   H/O elevated lipids 11/04/2019   Lymphedema 08/26/2019   Chronic venous insufficiency 08/26/2019   Encounter for screening colonoscopy    Polyp of sigmoid colon    Polyp of ascending colon    Lymphedema of left leg 06/16/2019   Prediabetes 06/16/2019   Abnormal laboratory test result 06/16/2019   Prediabetes 05/20/2019   Essential hypertension 05/19/2019   Abscess of left buttock 05/05/2019   Essential hypertension 05/05/2019   CAD (coronary artery disease) 05/05/2019   NSTEMI (non-ST elevated myocardial infarction) (HCC)  05/16/2018    PCP: Duanne Limerick, MD  REFERRING PROVIDER: Levora Dredge, MD  REFERRING DIAG: I89.0  THERAPY DIAG:  Lymphedema, not elsewhere classified  Rationale for Evaluation and Treatment: Rehabilitation  ONSET DATE: 03/14/23  SUBJECTIVE:                                                                                                                                                                                           SUBJECTIVE STATEMENT: Ms Peterman presents for OT visit to address BLE lymphedema, L>R without wraps in place. She brings clean, rolled wraps in a bag for application  after manual therapy.  When asked  Pt denies LE-related leg pain and has no new complaints this morning. 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 She is volunteering 4 hours on Thursdays, which requires a lot of walking and standing.  PERTINENT HISTORY: LE, CVI, HTN, Prediabetes, CAD, s/p NSTEMI 04/2028  PAIN: Are you having lymphedema -related  pain? No  PRECAUTIONS: Other: LYMPHEDEMA PRECAUTIONS: CARDIAC, DM- skin  FALLS: Has patient fallen since last visit? No  HAND DOMINANCE: right   PRIOR LEVEL OF FUNCTION: Independent  PATIENT GOALS: to be able to do something better to take care of my legs; to learn about any new management technology; to keep the lymphedema from getting worse."   OBJECTIVE:  OBSERVATIONS / OTHER ASSESSMENTS: Moderate, Stage  II, Primary, Bilateral Lower Extremity Lymphedema, L>R (Lymphedema Praecox)  FOTO functional outcome score: Intake 03/14/23: 81%  Lymphedema Life Impact Scale (LLIS) Intake 03/14/23: 20.59% (The extent to which lymphedema -related problems impacted your life in the past week)    BLE COMPARATIVE LIMB VOLUMETRICS Intake Visit 1 03/21/23  LANDMARK RIGHT    R LEG (A-D) 3627.8 ml  R THIGH (E-G) 6402.0 ml  R FULL LIMB (A-G) 10029.8 ml  Limb Volume differential (LVD)  %  Volume change since initial %  Volume change overall V  (Blank rows = not tested)  LANDMARK LEFT    L LEG (A-D) 4875.0 ml  L THIGH (E-G) 6129.6 ml  L  FULL LIMB (A-G) 11004.6 ml  Limb Volume differential (LVD)  Leg LVD = 25.6%, L>R Thigh LVD = 4.41%, R (dominant)>L Full Limb LVD = 8.85%, L>R%  Volume change since initial %  Volume change overall %  (Blank rows = not tested)   LLE COMPARATIVE LIMB VOLUMETRICS 9 th Rx Visit  05/03/23  LANDMARK LEFT    L LEG (A-D) 4609.5 ml  L THIGH (E-G) 6182.3 ml  L  FULL LIMB (A-G) 10791.9 ml  Limb Volume differential (LVD)  L LEG reduction = 5.45% since commencing CDT on 03/21/23.  L  THIGH is essentially unchanged with INCREASED volume measuring 0.86%. Full LLE volume reduction measures 1.93% to date.  Volume change since initial %  Volume change overall %  (Blank rows = not tested)   LLE COMPARATIVE LIMB VOLUMETRICS 18 th Rx Visit  06/18/23  LANDMARK LEFT    L LEG (A-D) 4544.6  ml  L THIGH (E-G) 6362.5 ml  L  FULL LIMB (A-G) 10907.2 ml  Limb Volume change since last measured on 04/23/23 L LEG reduction = 1.4 % since last measured on 05/03/23.  L THIGH is INCREASED by 2.9% since 6/14; and full limb volume is increased overall by 2.9% since last measured.   Volume change since initial Since initially measured the L leg is reduced by 6.8%. The L thigh is increased by 3.8%. And the full limb volume is decreased by .9% since initially measuring volume on 03/21/23.  Volume change overall %     TODAY'S TREATMENT:  LLE MLD Multilayer, knee length, LLE compression wrap                                                                                                                              PATIENT EDUCATION:  Continued Pt/ CG edu for lymphedema self care home program throughout session. Topics include outcome of comparative limb volumetrics- starting limb volume differentials (LVDs), technology and gradient techniques used for short stretch, multilayer compression wrapping, simple self-MLD, therapeutic lymphatic pumping exercises, skin/nail care, LE precautions,. compression garment recommendations and specifications, wear and care schedule and compression garment donning / doffing w assistive devices. Discussed progress towards all OT goals since commencing CDT. All questions answered to the Pt's satisfaction. Good return.  Person educated: Patient Education method: Explanation, Demonstration, and Handouts Education comprehension: verbalized understanding, returned demonstration, and needs further education  LE SELF-CARE HOME PROGRAM: BLE Lymphatic Pumping There ex Skin  care to limit infection risk Compression Intensive stage compression: multilayer short stretch wraps with gradient techniques. One limb at a time. Self-management Phase Compression: Custom, flat knit, BLE compression knee highs. Consider Elvarex classic for daytime and Jobst Relax for HOS Simple self-MLD LLE/LLQ MLD utilizing short neck sequence, deep, diaphragmatic breathing, stationary EJ strokes to functional inguinal LN at L groin, then dynamic EJ strokes from proximal to distal from thigh, knee, leg and foot, with 3 sweeps to terminus to complete session.   Custom-made gradient compression garments and HOS devices are medically necessary in this case because they are uniquely sized and shaped to fit the exact dimensions of the affected extremities with deformities, and to provide accurate and consistent gradient compression and containment, essential to optimally managing this patient's symptoms of chronic, progressive lymphedema. Multiple custom compression garments are needed for optimal hygiene to limit infection risk. Custom compression garments should be replaced q 3-6 months When worn consistently for optimal lipo-lymphedema self-management over time.  ASSESSMENT: CLINICAL IMPRESSION: Pt reports  she had increased compliance with wrapping again this past week. LLE limb volume and tissue density is noticeably decreasing again after a period of reduced compliance w LE self care between visits.  Pt tolerated MLD with simultaneous skin care throughout session. Applied gradient compression wraps as established. Pt agrees to extend compression compliance  goal to the next visit, and we'll keep working on that as a short term objective until we can report at least 85% compliance  with that aspect of LE self care.  Cont as per POC.  OBJECTIVE IMPAIRMENTS: decreased knowledge of condition, decreased knowledge of use of DME, decreased mobility, increased edema, pain, and chronic lower extremity swelling  and associated pain, lymphedema related skin changes ( decreased excursion, increased skin approximation) increased infection risk  ACTIVITY LIMITATIONS: sitting tolerance, standing tolerance, and functional ambulation. Basic and Instrumental ADLS ( fitting shoes, socks and lower body clothing, grooming nails, inspecting skin, performing skin care, driving, shopping, housework) leisure pursuits (walking for exercise), productive activities ( caring for others, work duties),   PARTICIPATION LIMITATIONS:  negative body image limits clothing selection and social participation  PERSONAL FACTORS: Time since onset of onset/ level of progression; exacerbating co morbidities: : HTN, CVI   REHAB POTENTIAL: Good  EVALUATION COMPLEXITY: Moderate   GOALS: Goals reviewed with patient? Yes  SHORT TERM GOALS: Target date: 4th OT Rx visit  Pt will demonstrate understanding of lymphedema precautions and prevention strategies with modified independence using a printed reference to identify at least 5 precautions and discussing how s/he may implement them into daily life to reduce risk of progression with extra time. Baseline:Max A Goal status: 06/10/23 GOAL MET  2.  Pt will be able to apply multilayer, thigh length, gradient, compression wraps to one leg at a time with modified assistance (extra time) to decrease limb volume, to limit infection risk, and to limit lymphedema progression.  Baseline: Dependent Goal status: 05/07/23 GOAL MET  LONG TERM GOALS: Target date:   Given this patient's Intake score of 81/100% on the functional outcomes FOTO tool, patient will experience an increase in function of 3 points to improve basic and instrumental ADLs performance, including lymphedema self-care.  Baseline: 81% Goal status: 07/02/23 PROGRESSING  2.  Given this patient's Intake score of 20.59 % on the Lymphedema Life Impact Scale (LLIS), patient will experience a reduction of at least 5 points in her perceived  level of functional impairment resulting from lymphedema to improve functional performance and quality of life (QOL). Baseline: 20.59% Goal status:07/02/23 PROGRESSING  3.  During Intensive phase CDT Pt will achieve at least 85% compliance with all lymphedema self-care home program components, including daily skin care, compression wraps and /or garments, simple self MLD and lymphatic pumping therex to habituate LE self care protocol  into ADLs for optimal LE self-management over time. Baseline: Dependent Goal status:07/02/23 PROGRESSING. Pt's reports 90% compliance with daily self-bandaging LLE is not in keeping with condition of limb swelling and tissue density palpable today. Pt created a new short term objective toward building compliance with the compression aspect of LE self care.  3 a. Between this date, 8/13, and upcoming visit later this week, 8/15, Pt will achieve 100% compliance with daily compression bandaging to limit LE progression. 07/04/23 MET  4.  Pt will achieve at least a 10% volume reduction in the BLE to return limb to typical size and shape, to limit infection risk and LE progression, to decrease pain, to improve function. Baseline: Dependent Goal status: 05/07/23 PROGRESSING:  L LEG reduction = 5.45% since commencing CDT on 03/21/23. L THIGH is essentially unchanged with INCREASED volume measuring 0.86%.Full LLE volume reduction measures 1.93% to date.  07/02/23 PROGRESSING L LEG reduction = 1.4 % since last measured on 05/03/23.  L THIGH is INCREASED by 2.9% since 6/14; and full limb volume is increased overall by 2.9% since last measured. %.   Since initially measured the L leg has reduced by 6.8%. The L thigh is increased by 3.8%. And the full limb volume is decreased by .9% since initially measuring volume on 03/21/23.    5.  STG: Pt will obtain proper compression garments/devices and achieve modified independence (extra time + assistive devices) with donning/doffing to optimize  limb volume reductions and limit LE  progression over time. Baseline: Max A Goal status: 07/02/23 PROGRESSING  PLAN:  PT FREQUENCY: 2x/weekand PRN  PT DURATION: other: and PRN  PLANNED INTERVENTIONS: Complete Decongestive Therapy (CDT): Therapeutic lymphatic pumping exercises, skin care simultaneously w MLD to limit infection risk, Manual lymph drainage (MLD), During Intensive Phase- multilayer Compression bandaging; during self-management phase formulate appropriate compression garments and devices, complete anatomical measurements, fitting and assessment t of garments; Consider trial of Flexitouch ADVANCED sequential compression device as replacement for incorrect basic vaso pneumatic device. Patient/Family education, DME instructions,    PLAN FOR NEXT SESSION:  Review progress towards goals and plans to progress Continue MLD  and skin care to LLE/LLQ. Cont LLE knee length multilayer compression wrap   Loel Dubonnet, MS, OTR/L, CLT-LANA 07/10/23 10:02 AM

## 2023-07-11 ENCOUNTER — Encounter: Payer: 59 | Admitting: Occupational Therapy

## 2023-07-15 ENCOUNTER — Ambulatory Visit: Payer: 59 | Admitting: Occupational Therapy

## 2023-07-16 ENCOUNTER — Encounter: Payer: 59 | Admitting: Occupational Therapy

## 2023-07-17 ENCOUNTER — Ambulatory Visit: Payer: 59 | Admitting: Occupational Therapy

## 2023-07-17 DIAGNOSIS — I890 Lymphedema, not elsewhere classified: Secondary | ICD-10-CM | POA: Diagnosis not present

## 2023-07-17 NOTE — Therapy (Signed)
OUTPATIENT OCCUPATIONAL THERAPY TREATMENT NOTE   LOWER EXTREMITY LYMPHEDEMA  Patient Name: Kelsey Terry MRN: [id number] DOB:1961-05-21, 62 y.o., female Today's Date: 05/16/23    END OF SESSION:   OT End of Session - 07/17/23 0804     Visit Number 23    Number of Visits 36    Date for OT Re-Evaluation 09/18/23    OT Start Time 0856    Activity Tolerance Patient tolerated treatment well;No increased pain    Behavior During Therapy [facility] for tasks assessed/performed               Past Medical History:  Diagnosis Date   Allergies    Asthma    Blood dyscrasia    blood clot in right leg   CAD (coronary artery disease) 05/05/2019   CAD in native artery    a. NSTEMI 05/16/18: LHC 05/16/18 - ost-pLAD 95% s/p PCI/DES, mLAD 30%, ostD1 70%, ost-pLCx 30%, mRCA 90%, dRCA 20%; b. staged PCI/DES to [facility] 7/1   Complication of anesthesia    patient stated she had "some kind of reaction to anesthesia, "thought she was having a stroke" during her second cath procedure.   Diabetes mellitus without complication (HCC)    pre-diabetes   Diastolic dysfunction    a. TTE 05/16/18: EF 55-60%, no RWMA, Gr1DD, trivial AI, calcified mitral annulus, mild MR, mildly dilated LA   Dyspnea    with activity   Hypertension    Lymphedema    LLE   Pericarditis    a. ~ 2012 in [place], [place] s/p pericardiocentesis    Wears contact lenses    Past Surgical History:  Procedure Laterality Date   ABDOMINAL HYSTERECTOMY     CESAREAN SECTION     COLONOSCOPY WITH PROPOFOL N/A 08/13/2019   Procedure: COLONOSCOPY WITH BIOPSIES;  Surgeon: Midge Minium, MD;  Location: [facility];  Service: Endoscopy;  Laterality: N/A;   CORONARY STENT INTERVENTION N/A 05/16/2018   Procedure: CORONARY STENT INTERVENTION;  Surgeon: Iran Ouch, MD;  Location: [facility] INVASIVE CV LAB;  Service: Cardiovascular;  Laterality: N/A;   CORONARY STENT INTERVENTION N/A 05/19/2018   Procedure: CORONARY STENT INTERVENTION;  Surgeon:  Iran Ouch, MD;  Location: [facility] INVASIVE CV LAB;  Service: Cardiovascular;  Laterality: N/A;   LEFT HEART CATH AND CORONARY ANGIOGRAPHY N/A 05/16/2018   Procedure: LEFT HEART CATH AND CORONARY ANGIOGRAPHY;  Surgeon: Iran Ouch, MD;  Location: [facility] INVASIVE CV LAB;  Service: Cardiovascular;  Laterality: N/A;   PERICARDIOCENTESIS     POLYPECTOMY N/A 08/13/2019   Procedure: POLYPECTOMY;  Surgeon: Midge Minium, MD;  Location: [facility];  Service: Endoscopy;  Laterality: N/A;   Patient Active Problem List   Diagnosis Date Noted   Cough 03/31/2022   Hyperlipidemia 03/06/2021   Familial hypercholesterolemia 08/23/2020   Acute bilateral knee pain 01/13/2020   Health care maintenance 11/24/2019   H/O elevated lipids 11/04/2019   Lymphedema 08/26/2019   Chronic venous insufficiency 08/26/2019   Encounter for screening colonoscopy    Polyp of sigmoid colon    Polyp of ascending colon    Lymphedema of left leg 06/16/2019   Prediabetes 06/16/2019   Abnormal laboratory test result 06/16/2019   Prediabetes 05/20/2019   Essential hypertension 05/19/2019   Abscess of left buttock 05/05/2019   Essential hypertension 05/05/2019   CAD (coronary artery disease) 05/05/2019   NSTEMI (non-ST elevated myocardial infarction) (HCC) 05/16/2018    PCP: Duanne Limerick, MD  REFERRING PROVIDER: Levora Dredge, MD  REFERRING DIAG: I89.0  THERAPY DIAG:  Lymphedema, not elsewhere classified  Rationale for Evaluation and Treatment: Rehabilitation  ONSET DATE: 03/14/23  SUBJECTIVE:                                                                                                                                                                                           SUBJECTIVE STATEMENT: Ms Sarasin presents for OT visit to address BLE lymphedema, L>R without wraps in place. She brings clean, rolled wraps in a bag for application after manual therapy.  When asked  Pt denies LE-related leg  pain and has no new complaints this morning. Pt and OT discussed best way to use remaining allowable 7 visits, and we agree to reduce OT frequency from 2 x to 1 x weekly.  PERTINENT HISTORY: LE, CVI, HTN, Prediabetes, CAD, s/p NSTEMI 04/2028  PAIN: Are you having lymphedema -related  pain? No  PRECAUTIONS: Other: LYMPHEDEMA PRECAUTIONS: CARDIAC, DM- skin  FALLS: Has patient fallen since last visit? No  HAND DOMINANCE: right   PRIOR LEVEL OF FUNCTION: Independent  PATIENT GOALS: to be able to do something better to take care of my legs; to learn about any new management technology; to keep the lymphedema from getting worse."   OBJECTIVE:  OBSERVATIONS / OTHER ASSESSMENTS: Moderate, Stage  II, Primary, Bilateral Lower Extremity Lymphedema, L>R (Lymphedema [place]ecox)  FOTO functional outcome score: Intake 03/14/23: 81%  Lymphedema Life Impact Scale (LLIS) Intake 03/14/23: 20.59% (The extent to which lymphedema -related problems impacted your life in the past week)    BLE COMPARATIVE LIMB VOLUMETRICS Intake Visit 1 03/21/23  LANDMARK RIGHT    R LEG (A-D) 3627.8 ml  R THIGH (E-G) 6402.0 ml  R FULL LIMB (A-G) 10029.8 ml  Limb Volume differential (LVD)  %  Volume change since initial %  Volume change overall V  (Blank rows = not tested)  LANDMARK LEFT    L LEG (A-D) 4875.0 ml  L THIGH (E-G) 6129.6 ml  L  FULL LIMB (A-G) 11004.6 ml  Limb Volume differential (LVD)  Leg LVD = 25.6%, L>R Thigh LVD = 4.41%, R (dominant)>L Full Limb LVD = 8.85%, L>R%  Volume change since initial %  Volume change overall %  (Blank rows = not tested)   LLE COMPARATIVE LIMB VOLUMETRICS 9 th Rx Visit  05/03/23  LANDMARK LEFT    L LEG (A-D) 4609.5 ml  L THIGH (E-G) 6182.3 ml  L  FULL LIMB (A-G) 10791.9 ml  Limb Volume differential (LVD)  L LEG reduction = 5.45% since commencing CDT on 03/21/23.  L THIGH is essentially unchanged with INCREASED volume measuring  0.86%. Full LLE volume reduction measures  1.93% to date.  Volume change since initial %  Volume change overall %  (Blank rows = not tested)   LLE COMPARATIVE LIMB VOLUMETRICS 18 th Rx Visit  06/18/23  LANDMARK LEFT    L LEG (A-D) 4544.6  ml  L THIGH (E-G) 6362.5 ml  L  FULL LIMB (A-G) 10907.2 ml  Limb Volume change since last measured on 04/23/23 L LEG reduction = 1.4 % since last measured on 05/03/23.  L THIGH is INCREASED by 2.9% since 6/14; and full limb volume is increased overall by 2.9% since last measured.   Volume change since initial Since initially measured the L leg is reduced by 6.8%. The L thigh is increased by 3.8%. And the full limb volume is decreased by .9% since initially measuring volume on 03/21/23.  Volume change overall %     TODAY'S TREATMENT:  LLE MLD Multilayer, knee length, LLE compression wrap                                                                                                                              PATIENT EDUCATION:  Continued Pt/ CG edu for lymphedema self care home program throughout session. Topics include outcome of comparative limb volumetrics- starting limb volume differentials (LVDs), technology and gradient techniques used for short stretch, multilayer compression wrapping, simple self-MLD, therapeutic lymphatic pumping exercises, skin/nail care, LE precautions,. compression garment recommendations and specifications, wear and care schedule and compression garment donning / doffing w assistive devices. Discussed progress towards all OT goals since commencing CDT. All questions answered to the Pt's satisfaction. Good return.  Person educated: Patient Education method: Explanation, Demonstration, and Handouts Education comprehension: verbalized understanding, returned demonstration, and needs further education  LE SELF-CARE HOME PROGRAM: BLE Lymphatic Pumping There ex Skin care to limit infection risk Compression Intensive stage compression: multilayer short stretch wraps with  gradient techniques. One limb at a time. Self-management Phase Compression: Custom, flat knit, BLE compression knee highs. Consider Elvarex classic for daytime and Jobst Relax for HOS Simple self-MLD LLE/LLQ MLD utilizing short neck sequence, deep, diaphragmatic breathing, stationary EJ strokes to functional inguinal LN at L groin, then dynamic EJ strokes from proximal to distal from thigh, knee, leg and foot, with 3 sweeps to terminus to complete session.   Custom-made gradient compression garments and HOS devices are medically necessary in this case because they are uniquely sized and shaped to fit the exact dimensions of the affected extremities with deformities, and to provide accurate and consistent gradient compression and containment, essential to optimally managing this patient's symptoms of chronic, progressive lymphedema. Multiple custom compression garments are needed for optimal hygiene to limit infection risk. Custom compression garments should be replaced q 3-6 months When worn consistently for optimal lipo-lymphedema self-management over time.  ASSESSMENT: CLINICAL IMPRESSION: Pt in agreement with decreasing OT frequency to 1 x weekly to conserve visits. Pt also agrees with plan to complete custom  LLE compression garment/s measurements and specifications      next session. She is encouraged to be extra diligent with wraps until then  to         be in best state of volume reduction in the LLE possible.  Pt tolerated MLD with simultaneous skin care throughout session. Applied gradient compression wraps as established. Pt agrees to extend compression compliance  goal to the next visit, and we'll keep working on that as a short term objective until we can report at least 85% compliance  with that aspect of LE self care.  Cont as per POC.  OBJECTIVE IMPAIRMENTS: decreased knowledge of condition, decreased knowledge of use of DME, decreased mobility, increased edema, pain, and chronic lower  extremity swelling and associated pain, lymphedema related skin changes ( decreased excursion, increased skin approximation) increased infection risk  ACTIVITY LIMITATIONS: sitting tolerance, standing tolerance, and functional ambulation. Basic and Instrumental ADLS ( fitting shoes, socks and lower body clothing, grooming nails, inspecting skin, performing skin care, driving, shopping, housework) leisure pursuits (walking for exercise), productive activities ( caring for others, work duties),   PARTICIPATION LIMITATIONS:  negative body image limits clothing selection and social participation  PERSONAL FACTORS: Time since onset of onset/ level of progression; exacerbating co morbidities: : HTN, CVI   REHAB POTENTIAL: Good  EVALUATION COMPLEXITY: Moderate   GOALS: Goals reviewed with patient? Yes  SHORT TERM GOALS: Target date: 4th OT Rx visit  Pt will demonstrate understanding of lymphedema precautions and prevention strategies with modified independence using a printed reference to identify at least 5 precautions and discussing how s/he may implement them into daily life to reduce risk of progression with extra time. Baseline:Max A Goal status: 06/10/23 GOAL MET  2.  Pt will be able to apply multilayer, thigh length, gradient, compression wraps to one leg at a time with modified assistance (extra time) to decrease limb volume, to limit infection risk, and to limit lymphedema progression.  Baseline: Dependent Goal status: 05/07/23 GOAL MET  LONG TERM GOALS: Target date:   Given this patient's Intake score of 81/100% on the functional outcomes FOTO tool, patient will experience an increase in function of 3 points to improve basic and instrumental ADLs performance, including lymphedema self-care.  Baseline: 81% Goal status: 07/02/23 PROGRESSING  2.  Given this patient's Intake score of 20.59 % on the Lymphedema Life Impact Scale (LLIS), patient will experience a reduction of at least 5  points in her perceived level of functional impairment resulting from lymphedema to improve functional performance and quality of life (QOL). Baseline: 20.59% Goal status:07/02/23 PROGRESSING  3.  During Intensive phase CDT Pt will achieve at least 85% compliance with all lymphedema self-care home program components, including daily skin care, compression wraps and /or garments, simple self MLD and lymphatic pumping therex to habituate LE self care protocol  into ADLs for optimal LE self-management over time. Baseline: Dependent Goal status:07/02/23 PROGRESSING. Pt's reports 90% compliance with daily self-bandaging LLE is not in keeping with condition of limb swelling and tissue density palpable today. Pt created a new short term objective toward building compliance with the compression aspect of LE self care.  3 a. Between this date, 8/13, and upcoming visit later this week, 8/15, Pt will achieve 100% compliance with daily compression bandaging to limit LE progression. 07/04/23 MET  4.  Pt will achieve at least a 10% volume reduction in the BLE to return limb to typical size and shape, to limit infection risk and LE  progression, to decrease pain, to improve function. Baseline: Dependent Goal status: 05/07/23 PROGRESSING: L LEG reduction = 5.45% since commencing CDT on 03/21/23. L THIGH is essentially unchanged with INCREASED volume measuring 0.86%.Full LLE volume reduction measures 1.93% to date.  07/02/23 PROGRESSING L LEG reduction = 1.4 % since last measured on 05/03/23.  L THIGH is INCREASED by 2.9% since 6/14; and full limb volume is increased overall by 2.9% since last measured. %.   Since initially measured the L leg has reduced by 6.8%. The L thigh is increased by 3.8%. And the full limb volume is decreased by .9% since initially measuring volume on 03/21/23.    5.  STG: Pt will obtain proper compression garments/devices and achieve modified independence (extra time + assistive devices) with  donning/doffing to optimize limb volume reductions and limit LE  progression over time. Baseline: Max A Goal status: 07/02/23 PROGRESSING  PLAN:  PT FREQUENCY: 2x/weekand PRN  PT DURATION: other: and PRN  PLANNED INTERVENTIONS: Complete Decongestive Therapy (CDT): Therapeutic lymphatic pumping exercises, skin care simultaneously w MLD to limit infection risk, Manual lymph drainage (MLD), During Intensive Phase- multilayer Compression bandaging; during self-management phase formulate appropriate compression garments and devices, complete anatomical measurements, fitting and assessment t of garments; Consider trial of Flexitouch ADVANCED sequential compression device as replacement for incorrect basic vaso pneumatic device. Patient/Family education, DME instructions,    PLAN FOR NEXT SESSION:  Review progress towards goals and plans to progress Continue MLD  and skin care to LLE/LLQ. Cont LLE knee length multilayer compression wrap   Loel Dubonnet, MS, OTR/L, CLT-LANA 07/17/23 8:08 AM

## 2023-07-18 ENCOUNTER — Ambulatory Visit: Payer: 59 | Admitting: Cardiovascular Disease

## 2023-07-21 ENCOUNTER — Other Ambulatory Visit: Payer: Self-pay | Admitting: Physician Assistant

## 2023-07-21 DIAGNOSIS — I2510 Atherosclerotic heart disease of native coronary artery without angina pectoris: Secondary | ICD-10-CM

## 2023-07-23 ENCOUNTER — Ambulatory Visit: Payer: 59 | Attending: Vascular Surgery | Admitting: Occupational Therapy

## 2023-07-23 DIAGNOSIS — I890 Lymphedema, not elsewhere classified: Secondary | ICD-10-CM | POA: Diagnosis not present

## 2023-07-23 NOTE — Therapy (Signed)
OUTPATIENT OCCUPATIONAL THERAPY TREATMENT NOTE   LOWER EXTREMITY LYMPHEDEMA  Patient Name: Kelsey Terry MRN: [id number] DOB:08/05/1961, 62 y.o., female Today's Date: 05/16/23    END OF SESSION:   OT End of Session - 07/23/23 0901     Visit Number 24    Number of Visits 36    Date for OT Re-Evaluation 09/18/23    OT Start Time 0803    OT Stop Time 0902    OT Time Calculation (min) 59 min    Activity Tolerance Patient tolerated treatment well;No increased pain    Behavior During Therapy [facility] for tasks assessed/performed               Past Medical History:  Diagnosis Date   Allergies    Asthma    Blood dyscrasia    blood clot in right leg   CAD (coronary artery disease) 05/05/2019   CAD in native artery    a. NSTEMI 05/16/18: LHC 05/16/18 - ost-pLAD 95% s/p PCI/DES, mLAD 30%, ostD1 70%, ost-pLCx 30%, mRCA 90%, dRCA 20%; b. staged PCI/DES to [facility] 7/1   Complication of anesthesia    patient stated she had "some kind of reaction to anesthesia, "thought she was having a stroke" during her second cath procedure.   Diabetes mellitus without complication (HCC)    pre-diabetes   Diastolic dysfunction    a. TTE 05/16/18: EF 55-60%, no RWMA, Gr1DD, trivial AI, calcified mitral annulus, mild MR, mildly dilated LA   Dyspnea    with activity   Hypertension    Lymphedema    LLE   Pericarditis    a. ~ 2012 in [place], [place] s/p pericardiocentesis    Wears contact lenses    Past Surgical History:  Procedure Laterality Date   ABDOMINAL HYSTERECTOMY     CESAREAN SECTION     COLONOSCOPY WITH PROPOFOL N/A 08/13/2019   Procedure: COLONOSCOPY WITH BIOPSIES;  Surgeon: Midge Minium, MD;  Location: [facility];  Service: Endoscopy;  Laterality: N/A;   CORONARY STENT INTERVENTION N/A 05/16/2018   Procedure: CORONARY STENT INTERVENTION;  Surgeon: Iran Ouch, MD;  Location: [facility];  Service: Cardiovascular;  Laterality: N/A;   CORONARY STENT INTERVENTION N/A  05/19/2018   Procedure: CORONARY STENT INTERVENTION;  Surgeon: Iran Ouch, MD;  Location: [facility];  Service: Cardiovascular;  Laterality: N/A;   LEFT HEART CATH AND CORONARY ANGIOGRAPHY N/A 05/16/2018   Procedure: LEFT HEART CATH AND CORONARY ANGIOGRAPHY;  Surgeon: Iran Ouch, MD;  Location: [facility];  Service: Cardiovascular;  Laterality: N/A;   PERICARDIOCENTESIS     POLYPECTOMY N/A 08/13/2019   Procedure: POLYPECTOMY;  Surgeon: Midge Minium, MD;  Location: [facility];  Service: Endoscopy;  Laterality: N/A;   Patient Active Problem List   Diagnosis Date Noted   Cough 03/31/2022   Hyperlipidemia 03/06/2021   Familial hypercholesterolemia 08/23/2020   Acute bilateral knee pain 01/13/2020   Health care maintenance 11/24/2019   H/O elevated lipids 11/04/2019   Lymphedema 08/26/2019   Chronic venous insufficiency 08/26/2019   Encounter for screening colonoscopy    Polyp of sigmoid colon    Polyp of ascending colon    Lymphedema of left leg 06/16/2019   Prediabetes 06/16/2019   Abnormal laboratory test result 06/16/2019   Prediabetes 05/20/2019   Essential hypertension 05/19/2019   Abscess of left buttock 05/05/2019   Essential hypertension 05/05/2019   CAD (coronary artery disease) 05/05/2019   NSTEMI (non-ST elevated myocardial infarction) (HCC)  05/16/2018    PCP: Duanne Limerick, MD  REFERRING PROVIDER: Levora Dredge, MD  REFERRING DIAG: I89.0  THERAPY DIAG:  Lymphedema, not elsewhere classified  Rationale for Evaluation and Treatment: Rehabilitation  ONSET DATE: 03/14/23  SUBJECTIVE:                                                                                                                                                                                           SUBJECTIVE STATEMENT: Kelsey Terry presents for OT visit to address BLE lymphedema, L>R without wraps in place. She brings clean, rolled wraps in a bag for application  after manual therapy.  When asked  Pt denies LE-related leg pain and has no new complaints this morning. Pt denies LE related leg pain.   PERTINENT HISTORY: LE, CVI, HTN, Prediabetes, CAD, s/p NSTEMI 04/2028  PAIN: Are you having lymphedema -related  pain? No  PRECAUTIONS: Other: LYMPHEDEMA PRECAUTIONS: CARDIAC, DM- skin  FALLS: Has patient fallen since last visit? No  HAND DOMINANCE: right   PRIOR LEVEL OF FUNCTION: Independent  PATIENT GOALS: to be able to do something better to take care of my legs; to learn about any new management technology; to keep the lymphedema from getting worse."   OBJECTIVE:  OBSERVATIONS / OTHER ASSESSMENTS: Moderate, Stage  II, Primary, Bilateral Lower Extremity Lymphedema, L>R (Lymphedema Praecox)  FOTO functional outcome score: Intake 03/14/23: 81%  Lymphedema Life Impact Scale (LLIS) Intake 03/14/23: 20.59% (The extent to which lymphedema -related problems impacted your life in the past week)    BLE COMPARATIVE LIMB VOLUMETRICS Intake Visit 1 03/21/23  LANDMARK RIGHT    R LEG (A-D) 3627.8 ml  R THIGH (E-G) 6402.0 ml  R FULL LIMB (A-G) 10029.8 ml  Limb Volume differential (LVD)  %  Volume change since initial %  Volume change overall V  (Blank rows = not tested)  LANDMARK LEFT    L LEG (A-D) 4875.0 ml  L THIGH (E-G) 6129.6 ml  L  FULL LIMB (A-G) 11004.6 ml  Limb Volume differential (LVD)  Leg LVD = 25.6%, L>R Thigh LVD = 4.41%, R (dominant)>L Full Limb LVD = 8.85%, L>R%  Volume change since initial %  Volume change overall %  (Blank rows = not tested)   LLE COMPARATIVE LIMB VOLUMETRICS 9 th Rx Visit  05/03/23  LANDMARK LEFT    L LEG (A-D) 4609.5 ml  L THIGH (E-G) 6182.3 ml  L  FULL LIMB (A-G) 10791.9 ml  Limb Volume differential (LVD)  L LEG reduction = 5.45% since commencing CDT on 03/21/23.  L THIGH is essentially unchanged with INCREASED volume measuring 0.86%. Full LLE  volume reduction measures 1.93% to date.  Volume change  since initial %  Volume change overall %  (Blank rows = not tested)   LLE COMPARATIVE LIMB VOLUMETRICS 18 th Rx Visit  06/18/23  LANDMARK LEFT    L LEG (A-D) 4544.6  ml  L THIGH (E-G) 6362.5 ml  L  FULL LIMB (A-G) 10907.2 ml  Limb Volume change since last measured on 04/23/23 L LEG reduction = 1.4 % since last measured on 05/03/23.  L THIGH is INCREASED by 2.9% since 6/14; and full limb volume is increased overall by 2.9% since last measured.   Volume change since initial Since initially measured the L leg is reduced by 6.8%. The L thigh is increased by 3.8%. And the full limb volume is decreased by .9% since initially measuring volume on 03/21/23.  Volume change overall %     TODAY'S TREATMENT:  LLE MLD Multilayer, knee length, LLE compression wrap                                                                                                                              PATIENT EDUCATION:  Continued Pt/ CG edu for lymphedema self care home program throughout session. Topics include outcome of comparative limb volumetrics- starting limb volume differentials (LVDs), technology and gradient techniques used for short stretch, multilayer compression wrapping, simple self-MLD, therapeutic lymphatic pumping exercises, skin/nail care, LE precautions,. compression garment recommendations and specifications, wear and care schedule and compression garment donning / doffing w assistive devices. Discussed progress towards all OT goals since commencing CDT. All questions answered to the Pt's satisfaction. Good return.  Person educated: Patient Education method: Explanation, Demonstration, and Handouts Education comprehension: verbalized understanding, returned demonstration, and needs further education  LE SELF-CARE HOME PROGRAM: BLE Lymphatic Pumping There ex Skin care to limit infection risk Compression Intensive stage compression: multilayer short stretch wraps with gradient techniques. One limb at  a time. Self-management Phase Compression: Custom, flat knit, BLE compression knee highs. Consider Elvarex classic for daytime and Jobst Relax for HOS Simple self-MLD LLE/LLQ MLD utilizing short neck sequence, deep, diaphragmatic breathing, stationary EJ strokes to functional inguinal LN at L groin, then dynamic EJ strokes from proximal to distal from thigh, knee, leg and foot, with 3 sweeps to terminus to complete session.   Custom-made gradient compression garments and HOS devices are medically necessary in this case because they are uniquely sized and shaped to fit the exact dimensions of the affected extremities with deformities, and to provide accurate and consistent gradient compression and containment, essential to optimally managing this patient's symptoms of chronic, progressive lymphedema. Multiple custom compression garments are needed for optimal hygiene to limit infection risk. Custom compression garments should be replaced q 3-6 months When worn consistently for optimal lipo-lymphedema self-management over time.  ASSESSMENT: CLINICAL IMPRESSION: Pt tolerated MLD with simultaneous skin care throughout session. Applied gradient compression wraps as established. Pt agrees to extend compression compliance  goal to the  next visit, and we'll keep working on that as a short term objective until we can report at least 85% compliance  with that aspect of LE self care.  Cont as per POC.  OBJECTIVE IMPAIRMENTS: decreased knowledge of condition, decreased knowledge of use of DME, decreased mobility, increased edema, pain, and chronic lower extremity swelling and associated pain, lymphedema related skin changes ( decreased excursion, increased skin approximation) increased infection risk  ACTIVITY LIMITATIONS: sitting tolerance, standing tolerance, and functional ambulation. Basic and Instrumental ADLS ( fitting shoes, socks and lower body clothing, grooming nails, inspecting skin, performing skin care,  driving, shopping, housework) leisure pursuits (walking for exercise), productive activities ( caring for others, work duties),   PARTICIPATION LIMITATIONS:  negative body image limits clothing selection and social participation  PERSONAL FACTORS: Time since onset of onset/ level of progression; exacerbating co morbidities: : HTN, CVI   REHAB POTENTIAL: Good  EVALUATION COMPLEXITY: Moderate   GOALS: Goals reviewed with patient? Yes  SHORT TERM GOALS: Target date: 4th OT Rx visit  Pt will demonstrate understanding of lymphedema precautions and prevention strategies with modified independence using a printed reference to identify at least 5 precautions and discussing how s/he may implement them into daily life to reduce risk of progression with extra time. Baseline:Max A Goal status: 06/10/23 GOAL MET  2.  Pt will be able to apply multilayer, thigh length, gradient, compression wraps to one leg at a time with modified assistance (extra time) to decrease limb volume, to limit infection risk, and to limit lymphedema progression.  Baseline: Dependent Goal status: 05/07/23 GOAL MET  LONG TERM GOALS: Target date:   Given this patient's Intake score of 81/100% on the functional outcomes FOTO tool, patient will experience an increase in function of 3 points to improve basic and instrumental ADLs performance, including lymphedema self-care.  Baseline: 81% Goal status: 07/02/23 PROGRESSING  2.  Given this patient's Intake score of 20.59 % on the Lymphedema Life Impact Scale (LLIS), patient will experience a reduction of at least 5 points in her perceived level of functional impairment resulting from lymphedema to improve functional performance and quality of life (QOL). Baseline: 20.59% Goal status:07/02/23 PROGRESSING  3.  During Intensive phase CDT Pt will achieve at least 85% compliance with all lymphedema self-care home program components, including daily skin care, compression wraps and /or  garments, simple self MLD and lymphatic pumping therex to habituate LE self care protocol  into ADLs for optimal LE self-management over time. Baseline: Dependent Goal status:07/02/23 PROGRESSING. Pt's reports 90% compliance with daily self-bandaging LLE is not in keeping with condition of limb swelling and tissue density palpable today. Pt created a new short term objective toward building compliance with the compression aspect of LE self care.  3 a. Between this date, 8/13, and upcoming visit later this week, 8/15, Pt will achieve 100% compliance with daily compression bandaging to limit LE progression. 07/04/23 MET  4.  Pt will achieve at least a 10% volume reduction in the BLE to return limb to typical size and shape, to limit infection risk and LE progression, to decrease pain, to improve function. Baseline: Dependent Goal status: 05/07/23 PROGRESSING: L LEG reduction = 5.45% since commencing CDT on 03/21/23. L THIGH is essentially unchanged with INCREASED volume measuring 0.86%.Full LLE volume reduction measures 1.93% to date.  07/02/23 PROGRESSING L LEG reduction = 1.4 % since last measured on 05/03/23.  L THIGH is INCREASED by 2.9% since 6/14; and full limb volume is increased overall by  2.9% since last measured. %.   Since initially measured the L leg has reduced by 6.8%. The L thigh is increased by 3.8%. And the full limb volume is decreased by .9% since initially measuring volume on 03/21/23.    5.  STG: Pt will obtain proper compression garments/devices and achieve modified independence (extra time + assistive devices) with donning/doffing to optimize limb volume reductions and limit LE  progression over time. Baseline: Max A Goal status: 07/02/23 PROGRESSING  PLAN:  PT FREQUENCY: 2x/weekand PRN  PT DURATION: other: and PRN  PLANNED INTERVENTIONS: Complete Decongestive Therapy (CDT): Therapeutic lymphatic pumping exercises, skin care simultaneously w MLD to limit infection risk, Manual  lymph drainage (MLD), During Intensive Phase- multilayer Compression bandaging; during self-management phase formulate appropriate compression garments and devices, complete anatomical measurements, fitting and assessment t of garments; Consider trial of Flexitouch ADVANCED sequential compression device as replacement for incorrect basic vaso pneumatic device. Patient/Family education, DME instructions,    PLAN FOR NEXT SESSION:  Measure for LLE    compression stocking and HOS device Continue MLD  and skin care to LLE/LLQ. Cont LLE knee length multilayer compression wrap   Loel Dubonnet, Kelsey, OTR/L, CLT-LANA 07/23/23 9:03 AM

## 2023-07-24 ENCOUNTER — Encounter: Payer: 59 | Admitting: Occupational Therapy

## 2023-07-25 ENCOUNTER — Encounter: Payer: 59 | Admitting: Occupational Therapy

## 2023-07-26 ENCOUNTER — Telehealth: Payer: Self-pay | Admitting: Cardiology

## 2023-07-26 NOTE — Telephone Encounter (Signed)
Patient would like to review calendar and call back to schedule

## 2023-07-26 NOTE — Telephone Encounter (Signed)
-----   Message from [facility] sent at 07/23/2023  9:22 AM EDT ----- Regarding: Appointment recall Renee Rival,  Will you please outreach patient to schedule overdue 12 month follow up.  Thanks,  [employer]

## 2023-07-30 ENCOUNTER — Ambulatory Visit: Payer: 59 | Admitting: Occupational Therapy

## 2023-07-31 NOTE — Telephone Encounter (Signed)
Patient does not wish to schedule at this time.

## 2023-08-01 ENCOUNTER — Encounter: Payer: 59 | Admitting: Occupational Therapy

## 2023-08-06 ENCOUNTER — Ambulatory Visit: Payer: 59 | Admitting: Occupational Therapy

## 2023-08-08 ENCOUNTER — Encounter: Payer: 59 | Admitting: Occupational Therapy

## 2023-08-13 ENCOUNTER — Ambulatory Visit: Payer: 59 | Admitting: Occupational Therapy

## 2023-08-20 DIAGNOSIS — H401122 Primary open-angle glaucoma, left eye, moderate stage: Secondary | ICD-10-CM | POA: Diagnosis not present

## 2023-08-23 ENCOUNTER — Encounter: Payer: 59 | Admitting: Occupational Therapy

## 2023-08-23 DIAGNOSIS — H401122 Primary open-angle glaucoma, left eye, moderate stage: Secondary | ICD-10-CM | POA: Diagnosis not present

## 2023-08-23 DIAGNOSIS — H401112 Primary open-angle glaucoma, right eye, moderate stage: Secondary | ICD-10-CM | POA: Diagnosis not present

## 2023-08-30 ENCOUNTER — Encounter: Payer: 59 | Admitting: Occupational Therapy

## 2023-09-03 ENCOUNTER — Ambulatory Visit: Payer: 59 | Admitting: Family Medicine

## 2023-09-06 ENCOUNTER — Encounter: Payer: 59 | Admitting: Occupational Therapy

## 2023-09-10 ENCOUNTER — Ambulatory Visit: Payer: 59 | Admitting: Family Medicine

## 2023-09-10 ENCOUNTER — Ambulatory Visit (INDEPENDENT_AMBULATORY_CARE_PROVIDER_SITE_OTHER): Payer: 59 | Admitting: Family Medicine

## 2023-09-10 ENCOUNTER — Ambulatory Visit: Payer: Self-pay

## 2023-09-10 ENCOUNTER — Encounter: Payer: Self-pay | Admitting: Family Medicine

## 2023-09-10 VITALS — BP 132/84 | HR 82 | Temp 97.7°F | Ht 63.0 in | Wt 172.0 lb

## 2023-09-10 DIAGNOSIS — J019 Acute sinusitis, unspecified: Secondary | ICD-10-CM

## 2023-09-10 MED ORDER — AZITHROMYCIN 250 MG PO TABS
ORAL_TABLET | ORAL | 0 refills | Status: AC
Start: 2023-09-10 — End: 2023-09-15

## 2023-09-10 MED ORDER — DOXYCYCLINE HYCLATE 100 MG PO TABS: 100.0000 mg | ORAL_TABLET | Freq: Two times a day (BID) | ORAL | 0 refills | Status: DC

## 2023-09-10 NOTE — Telephone Encounter (Signed)
Noted  KP 

## 2023-09-10 NOTE — Telephone Encounter (Signed)
Chief Complaint: Medication Question   Disposition: [] ED /[] Urgent Care (no appt availability in office) / [] Appointment(In office/virtual)/ []  [facility]/ [x] Home Care/ [] Refused Recommended Disposition /[] [facility]/ []  Follow-up with PCP Additional Notes: Patient at the pharmacy and was told she has two Rx ready for pick up. Patient stated one was for Doxycycline and the other was for Azithromycin. Care advice given and advised patient that PCP ordered Azithromycin today and I do not see an order fro Doxycyline in her records. PCP did not prescribe Doxycycline today. Patient verbalized understanding.  Reason for Disposition  Pharmacy calling with prescription question and triager answers question  Answer Assessment - Initial Assessment Questions 1. NAME of MEDICINE: "What medicine(s) are you calling about?"     Doxycycline  2. QUESTION: "What is your question?" (e.g., double dose of medicine, side effect)     The Pharmacy said I have a Rx for doxycycline, did Dr. Yetta Barre send this today? 3. PRESCRIBER: "Who prescribed the medicine?" Reason: if prescribed by specialist, call should be referred to that group.     Unknown 4. SYMPTOMS: "Do you have any symptoms?" If Yes, ask: "What symptoms are you having?"  "How bad are the symptoms (e.g., mild, moderate, severe)     None  Protocols used: Medication Question Call-A-AH

## 2023-09-10 NOTE — Progress Notes (Signed)
Date:  09/10/2023   Name:  Kelsey Terry   DOB:  1961/07/24   MRN:  [id number]   Chief Complaint: Cough (X 5 days, getting worse, Post nasal drip, clear mucous was yellow, no fever, )  Sinusitis This is a chronic problem. The current episode started more than 1 year ago. The problem has been gradually improving since onset. The pain is mild. Associated symptoms include congestion, coughing, headaches, sinus pressure and sneezing. Pertinent negatives include no chills, diaphoresis, ear pain, hoarse voice, neck pain, shortness of breath, sore throat or swollen glands. Past treatments include oral decongestants. The treatment provided mild relief.    Lab Results  Component Value Date   NA 142 05/09/2023   K 3.7 05/09/2023   CO2 28 05/09/2023   GLUCOSE 87 05/09/2023   BUN 16 05/09/2023   CREATININE 1.08 (H) 05/09/2023   CALCIUM 10.0 05/09/2023   EGFR 58 (L) 05/09/2023   GFRNONAA 60 (L) 06/27/2020   Lab Results  Component Value Date   CHOL 168 05/09/2023   HDL 63 05/09/2023   LDLCALC 93 05/09/2023   TRIG 60 05/09/2023   CHOLHDL 2.9 01/20/2020   Lab Results  Component Value Date   TSH 1.490 04/23/2023   Lab Results  Component Value Date   HGBA1C 6.2 (H) 05/09/2023   Lab Results  Component Value Date   WBC 3.2 (L) 04/23/2023   HGB 12.4 04/23/2023   HCT 39.4 04/23/2023   MCV 85 04/23/2023   PLT 169 04/23/2023   Lab Results  Component Value Date   ALT 16 05/09/2023   AST 23 05/09/2023   ALKPHOS 54 05/09/2023   BILITOT 0.8 05/09/2023   No results found for: "25OHVITD2", "25OHVITD3", "VD25OH"   Review of Systems  Constitutional:  Negative for chills, diaphoresis and fever.  HENT:  Positive for congestion, postnasal drip, rhinorrhea, sinus pressure, sinus pain and sneezing. Negative for ear pain, hoarse voice and sore throat.   Respiratory:  Positive for cough. Negative for chest tightness, shortness of breath and wheezing.   Cardiovascular:  Negative for chest pain,  palpitations and leg swelling.  Gastrointestinal:  Negative for abdominal pain.  Musculoskeletal:  Negative for neck pain.  Neurological:  Positive for headaches.    Patient Active Problem List   Diagnosis Date Noted   Cough 03/31/2022   Hyperlipidemia 03/06/2021   Familial hypercholesterolemia 08/23/2020   Acute bilateral knee pain 01/13/2020   Health care maintenance 11/24/2019   H/O elevated lipids 11/04/2019   Lymphedema 08/26/2019   Chronic venous insufficiency 08/26/2019   Encounter for screening colonoscopy    Polyp of sigmoid colon    Polyp of ascending colon    Lymphedema of left leg 06/16/2019   Prediabetes 06/16/2019   Abnormal laboratory test result 06/16/2019   Prediabetes 05/20/2019   Essential hypertension 05/19/2019   Abscess of left buttock 05/05/2019   Essential hypertension 05/05/2019   CAD (coronary artery disease) 05/05/2019   NSTEMI (non-ST elevated myocardial infarction) (HCC) 05/16/2018    Allergies  Allergen Reactions   Atrovent Nasal Spray [Ipratropium]     dizziness   Dorzolamide Other (See Comments)    Chest discomfort   Latanoprost Other (See Comments)    Chest discomfort   Timolol Other (See Comments)    Chest discomfort   Amoxicillin Itching and Other (See Comments)    Reaction: bump in vaginal region Has patient had a PCN reaction causing immediate rash, facial/tongue/throat swelling, SOB or lightheadedness with hypotension: No Has patient  had a PCN reaction causing severe rash involving mucus membranes or skin necrosis: No Has patient had a PCN reaction that required hospitalization: No Has patient had a PCN reaction occurring within the last 10 years: No If all of the above answers are "NO", then may proceed with Cephalosporin use.    Erythromycin Hives and Rash    Past Surgical History:  Procedure Laterality Date   ABDOMINAL HYSTERECTOMY     CESAREAN SECTION     COLONOSCOPY WITH PROPOFOL N/A 08/13/2019   Procedure: COLONOSCOPY  WITH BIOPSIES;  Surgeon: Midge Minium, MD;  Location: [facility];  Service: Endoscopy;  Laterality: N/A;   CORONARY STENT INTERVENTION N/A 05/16/2018   Procedure: CORONARY STENT INTERVENTION;  Surgeon: Iran Ouch, MD;  Location: [facility] INVASIVE CV LAB;  Service: Cardiovascular;  Laterality: N/A;   CORONARY STENT INTERVENTION N/A 05/19/2018   Procedure: CORONARY STENT INTERVENTION;  Surgeon: Iran Ouch, MD;  Location: [facility] INVASIVE CV LAB;  Service: Cardiovascular;  Laterality: N/A;   LEFT HEART CATH AND CORONARY ANGIOGRAPHY N/A 05/16/2018   Procedure: LEFT HEART CATH AND CORONARY ANGIOGRAPHY;  Surgeon: Iran Ouch, MD;  Location: [facility] INVASIVE CV LAB;  Service: Cardiovascular;  Laterality: N/A;   PERICARDIOCENTESIS     POLYPECTOMY N/A 08/13/2019   Procedure: POLYPECTOMY;  Surgeon: Midge Minium, MD;  Location: [facility];  Service: Endoscopy;  Laterality: N/A;    Social History   Tobacco Use   Smoking status: Never   Smokeless tobacco: Never  Vaping Use   Vaping status: Never Used  Substance Use Topics   Alcohol use: Never   Drug use: Never     Medication list has been reviewed and updated.  Current Meds  Medication Sig   aspirin EC (ASPIRIN LOW DOSE) 81 MG tablet Take 1 tablet (81 mg total) by mouth daily. PLEASE SCHEDULE FOLLOW UP APPOINTMENT BEFORE REQUESTING ANY ADDITIONAL REFILLS [id number]   Blood Pressure KIT 1 kit by Does not apply route daily.   CALCIUM CITRATE-VITAMIN D PO Take 1 tablet by mouth 2 (two) times daily.   chlorthalidone (HYGROTON) 25 MG tablet TAKE 1/2 TABLET(12.5MG      TOTAL) DAILY.   ciprofloxacin-dexamethasone (CIPRODEX) OTIC suspension SMARTSIG:In Ear(s)   losartan (COZAAR) 100 MG tablet Take 1 tablet (100 mg total) by mouth daily.   LUMIGAN 0.01 % SOLN 1 drop at bedtime.   metFORMIN (GLUCOPHAGE) 500 MG tablet Take 1 tablet (500 mg total) by mouth daily.   Multiple Vitamin (MULTIVITAMIN) tablet Take 1 tablet by mouth daily.    potassium chloride (KLOR-CON) 10 MEQ tablet Take 2 tablets (20 mEq total) by mouth daily.   rosuvastatin (CRESTOR) 40 MG tablet Take 1 tablet (40 mg total) by mouth daily.       09/10/2023   11:21 AM 05/09/2023    9:10 AM 04/22/2023    4:36 PM 07/05/2022    3:15 PM  GAD 7 : Generalized Anxiety Score  Nervous, Anxious, on Edge 0 0 0 0  Control/stop worrying 0 0 0 0  Worry too much - different things 0 0 0 0  Trouble relaxing 0 0 0 0  Restless 0 0 0 0  Easily annoyed or irritable 0 0 0 0  Afraid - awful might happen 0 0 0 0  Total GAD 7 Score 0 0 0 0  Anxiety Difficulty Not difficult at all Not difficult at all Not difficult at all Not difficult at all       09/10/2023  11:21 AM 05/09/2023    9:10 AM 04/22/2023    4:36 PM  Depression screen PHQ 2/9  Decreased Interest 0 0 0  Down, Depressed, Hopeless 0 0 0  PHQ - 2 Score 0 0 0  Altered sleeping 0 0 0  Tired, decreased energy 0 0 0  Change in appetite 0 0 0  Feeling bad or failure about yourself  0 0 0  Trouble concentrating 0 0 0  Moving slowly or fidgety/restless 0 0 0  Suicidal thoughts 0 0 0  PHQ-9 Score 0 0 0  Difficult doing work/chores Not difficult at all Not difficult at all Not difficult at all    BP Readings from Last 3 Encounters:  09/10/23 132/84  05/09/23 102/76  04/22/23 118/78    Physical Exam Vitals and nursing note reviewed. Exam conducted with a chaperone present.  Constitutional:      General: She is not in acute distress.    Appearance: She is not diaphoretic.  HENT:     Head: Normocephalic and atraumatic.     Right Ear: Hearing, tympanic membrane, ear canal and external ear normal.     Left Ear: Hearing, tympanic membrane, ear canal and external ear normal.     Nose: No congestion or rhinorrhea.     Right Turbinates: Not swollen.     Left Turbinates: Swollen.     Right Sinus: No maxillary sinus tenderness or frontal sinus tenderness.     Left Sinus: Maxillary sinus tenderness present. No  frontal sinus tenderness.     Mouth/Throat:     Lips: Pink.     Mouth: Mucous membranes are moist.     Tongue: No lesions.     Palate: No mass.     Pharynx: Oropharynx is clear. Postnasal drip present. No posterior oropharyngeal erythema.  Eyes:     General:        Right eye: No discharge.        Left eye: No discharge.     Conjunctiva/sclera: Conjunctivae normal.     Pupils: Pupils are equal, round, and reactive to light.  Neck:     Thyroid: No thyromegaly.     Vascular: No JVD.  Cardiovascular:     Rate and Rhythm: Normal rate and regular rhythm.     Heart sounds: Normal heart sounds, S1 normal and S2 normal. No murmur heard.    No systolic murmur is present.     No diastolic murmur is present.     No friction rub. No gallop. No S3 or S4 sounds.  Pulmonary:     Effort: Pulmonary effort is normal.     Breath sounds: Normal breath sounds. No decreased breath sounds, wheezing, rhonchi or rales.  Chest:     Chest wall: No tenderness.  Abdominal:     General: Bowel sounds are normal.     Palpations: Abdomen is soft. There is no mass.     Tenderness: There is no abdominal tenderness. There is no guarding.  Musculoskeletal:        General: Normal range of motion.     Cervical back: Normal range of motion and neck supple.  Lymphadenopathy:     Cervical: No cervical adenopathy.  Skin:    General: Skin is warm and dry.  Neurological:     Mental Status: She is alert.     Wt Readings from Last 3 Encounters:  09/10/23 172 lb (78 kg)  05/09/23 167 lb (75.8 kg)  04/22/23 171 lb (77.6 kg)  BP 132/84   Pulse 82   Temp 97.7 F (36.5 C) (Oral)   Ht 5\' 3"  (1.6 m)   Wt 172 lb (78 kg)   SpO2 95%   BMI 30.47 kg/m   Assessment and Plan: 1. Acute non-recurrent sinusitis, unspecified location New onset.  Persistent.  Stable.  Onset from Friday continues to have purulent nasal discharge with pressure over the maxillary sinuses and tenderness over the left maxillary sinus on  percussion.  Exam and history and present illness consistent with sinus infection and we will treat with azithromycin (patient denies having issues with taking this in the past) as well as the patient has been given samples of Mucinex DM to take 1 twice a day. - azithromycin (ZITHROMAX) 250 MG tablet; Take 2 tablets on day 1, then 1 tablet daily on days 2 through 5  Dispense: 6 tablet; Refill: 0     Elizabeth Sauer, MD

## 2023-09-13 ENCOUNTER — Encounter: Payer: 59 | Admitting: Occupational Therapy

## 2023-10-12 ENCOUNTER — Other Ambulatory Visit: Payer: Self-pay | Admitting: Physician Assistant

## 2023-10-12 DIAGNOSIS — I2510 Atherosclerotic heart disease of native coronary artery without angina pectoris: Secondary | ICD-10-CM

## 2023-11-14 ENCOUNTER — Ambulatory Visit: Payer: 59 | Admitting: Family Medicine

## 2023-11-19 ENCOUNTER — Ambulatory Visit: Payer: 59 | Admitting: Family Medicine

## 2023-11-21 ENCOUNTER — Ambulatory Visit: Payer: 59 | Admitting: Family Medicine

## 2023-12-19 ENCOUNTER — Ambulatory Visit: Payer: 59 | Admitting: Family Medicine

## 2023-12-19 ENCOUNTER — Encounter: Payer: Self-pay | Admitting: Family Medicine

## 2023-12-19 VITALS — BP 124/70 | HR 64 | Ht 63.0 in | Wt 175.0 lb

## 2023-12-19 DIAGNOSIS — I2510 Atherosclerotic heart disease of native coronary artery without angina pectoris: Secondary | ICD-10-CM | POA: Diagnosis not present

## 2023-12-19 DIAGNOSIS — R4589 Other symptoms and signs involving emotional state: Secondary | ICD-10-CM

## 2023-12-19 DIAGNOSIS — I10 Essential (primary) hypertension: Secondary | ICD-10-CM

## 2023-12-19 DIAGNOSIS — R7303 Prediabetes: Secondary | ICD-10-CM | POA: Diagnosis not present

## 2023-12-19 MED ORDER — CHLORTHALIDONE 25 MG PO TABS
ORAL_TABLET | ORAL | 1 refills | Status: DC
Start: 2023-12-19 — End: 2024-06-19

## 2023-12-19 MED ORDER — POTASSIUM CHLORIDE ER 10 MEQ PO TBCR
20.0000 meq | EXTENDED_RELEASE_TABLET | Freq: Every day | ORAL | 1 refills | Status: DC
Start: 2023-12-19 — End: 2024-04-27

## 2023-12-19 MED ORDER — ROSUVASTATIN CALCIUM 40 MG PO TABS
40.0000 mg | ORAL_TABLET | Freq: Every day | ORAL | 1 refills | Status: DC
Start: 2023-12-19 — End: 2024-06-19

## 2023-12-19 MED ORDER — LOSARTAN POTASSIUM 100 MG PO TABS: 100.0000 mg | ORAL_TABLET | Freq: Every day | ORAL | 1 refills | Status: DC

## 2023-12-19 MED ORDER — METFORMIN HCL 500 MG PO TABS: 500.0000 mg | ORAL_TABLET | Freq: Every day | ORAL | 1 refills | Status: DC

## 2023-12-19 NOTE — Patient Instructions (Signed)

## 2023-12-19 NOTE — Progress Notes (Signed)
Date:  12/19/2023   Name:  Kelsey Terry   DOB:  1961-09-28   MRN:  [id number]   Chief Complaint: Hypertension, Diabetes, Hyperlipidemia, and Referral (Patient would like referral to see psychiatry for letter for Emotional Support Animal.)  Hypertension This is a chronic problem. The current episode started more than 1 year ago. The problem has been gradually improving since onset. The problem is controlled. Pertinent negatives include no anxiety, blurred vision, chest pain, headaches, malaise/fatigue, neck pain, orthopnea, palpitations, peripheral edema, PND, shortness of breath or sweats. There are no associated agents to hypertension. Risk factors for coronary artery disease include stress. Past treatments include diuretics. The current treatment provides moderate improvement. There are no compliance problems.  There is no history of angina, CVA or left ventricular hypertrophy. There is no history of chronic renal disease, a hypertension causing med or renovascular disease.    Lab Results  Component Value Date   NA 142 05/09/2023   K 3.7 05/09/2023   CO2 28 05/09/2023   GLUCOSE 87 05/09/2023   BUN 16 05/09/2023   CREATININE 1.08 (H) 05/09/2023   CALCIUM 10.0 05/09/2023   EGFR 58 (L) 05/09/2023   GFRNONAA 60 (L) 06/27/2020   Lab Results  Component Value Date   CHOL 168 05/09/2023   HDL 63 05/09/2023   LDLCALC 93 05/09/2023   TRIG 60 05/09/2023   CHOLHDL 2.9 01/20/2020   Lab Results  Component Value Date   TSH 1.490 04/23/2023   Lab Results  Component Value Date   HGBA1C 6.2 (H) 05/09/2023   Lab Results  Component Value Date   WBC 3.2 (L) 04/23/2023   HGB 12.4 04/23/2023   HCT 39.4 04/23/2023   MCV 85 04/23/2023   PLT 169 04/23/2023   Lab Results  Component Value Date   ALT 16 05/09/2023   AST 23 05/09/2023   ALKPHOS 54 05/09/2023   BILITOT 0.8 05/09/2023   No results found for: "25OHVITD2", "25OHVITD3", "VD25OH"   Review of Systems  Constitutional:   Negative for activity change, appetite change and malaise/fatigue.  HENT:  Negative for congestion, ear discharge, hearing loss, sore throat, tinnitus and trouble swallowing.   Eyes:  Negative for blurred vision, redness and visual disturbance.  Respiratory:  Negative for choking, chest tightness and shortness of breath.   Cardiovascular:  Negative for chest pain, palpitations, orthopnea and PND.  Gastrointestinal:  Negative for abdominal distention and abdominal pain.  Endocrine: Negative for cold intolerance, heat intolerance, polyphagia and polyuria.  Musculoskeletal:  Negative for neck pain.  Neurological:  Negative for headaches.    Patient Active Problem List   Diagnosis Date Noted   Cough 03/31/2022   Hyperlipidemia 03/06/2021   Familial hypercholesterolemia 08/23/2020   Acute bilateral knee pain 01/13/2020   Health care maintenance 11/24/2019   H/O elevated lipids 11/04/2019   Lymphedema 08/26/2019   Chronic venous insufficiency 08/26/2019   Encounter for screening colonoscopy    Polyp of sigmoid colon    Polyp of ascending colon    Lymphedema of left leg 06/16/2019   Prediabetes 06/16/2019   Abnormal laboratory test result 06/16/2019   Prediabetes 05/20/2019   Essential hypertension 05/19/2019   Abscess of left buttock 05/05/2019   Essential hypertension 05/05/2019   CAD (coronary artery disease) 05/05/2019   NSTEMI (non-ST elevated myocardial infarction) (HCC) 05/16/2018    Allergies  Allergen Reactions   Atrovent Nasal Spray [Ipratropium]     dizziness   Dorzolamide Other (See Comments)    Chest  discomfort   Latanoprost Other (See Comments)    Chest discomfort   Timolol Other (See Comments)    Chest discomfort   Amoxicillin Itching and Other (See Comments)    Reaction: bump in vaginal region Has patient had a PCN reaction causing immediate rash, facial/tongue/throat swelling, SOB or lightheadedness with hypotension: No Has patient had a PCN reaction causing  severe rash involving mucus membranes or skin necrosis: No Has patient had a PCN reaction that required hospitalization: No Has patient had a PCN reaction occurring within the last 10 years: No If all of the above answers are "NO", then may proceed with Cephalosporin use.    Erythromycin Hives and Rash    Past Surgical History:  Procedure Laterality Date   ABDOMINAL HYSTERECTOMY     CESAREAN SECTION     COLONOSCOPY WITH PROPOFOL N/A 08/13/2019   Procedure: COLONOSCOPY WITH BIOPSIES;  Surgeon: Midge Minium, MD;  Location: [facility];  Service: Endoscopy;  Laterality: N/A;   CORONARY STENT INTERVENTION N/A 05/16/2018   Procedure: CORONARY STENT INTERVENTION;  Surgeon: Iran Ouch, MD;  Location: [facility] INVASIVE CV LAB;  Service: Cardiovascular;  Laterality: N/A;   CORONARY STENT INTERVENTION N/A 05/19/2018   Procedure: CORONARY STENT INTERVENTION;  Surgeon: Iran Ouch, MD;  Location: [facility] INV[facility] CV [facility];  Service: Cardiovascular;  Laterality: N/A;   LEFT HEART CATH AND CORONARY ANGIOGRAPHY N/A 05/16/2018   Procedure: LEFT HEART CATH AND CORONARY ANGIOGRAPHY;  Surgeon: Iran Ouch, MD;  Location: [facility] INVASIVE CV LAB;  Service: Cardiovascular;  Laterality: N/A;   PERICARDIOCENTESIS     POLYPECTOMY N/A 08/13/2019   Procedure: POLYPECTOMY;  Surgeon: Midge Minium, MD;  Location: [facility];  Service: Endoscopy;  Laterality: N/A;    Social History   Tobacco Use   Smoking status: Never   Smokeless tobacco: Never  Vaping Use   Vaping status: Never Used  Substance Use Topics   Alcohol use: Never   Drug use: Never     Medication list has been reviewed and updated.  Current Meds  Medication Sig   aspirin EC (ASPIRIN LOW DOSE) 81 MG tablet TAKE 1 TABLET DAILY   CALCIUM CITRATE-VITAMIN D PO Take 1 tablet by mouth 2 (two) times daily.   LUMIGAN 0.01 % SOLN 1 drop at bedtime.   Multiple Vitamin (MULTIVITAMIN) tablet Take 1 tablet by mouth daily.    [DISCONTINUED] chlorthalidone (HYGROTON) 25 MG tablet TAKE 1/2 TABLET(12.5MG      TOTAL) DAILY.   [DISCONTINUED] losartan (COZAAR) 100 MG tablet Take 1 tablet (100 mg total) by mouth daily.   [DISCONTINUED] metFORMIN (GLUCOPHAGE) 500 MG tablet Take 1 tablet (500 mg total) by mouth daily.   [DISCONTINUED] potassium chloride (KLOR-CON) 10 MEQ tablet Take 2 tablets (20 mEq total) by mouth daily.   [DISCONTINUED] rosuvastatin (CRESTOR) 40 MG tablet Take 1 tablet (40 mg total) by mouth daily.       12/19/2023   10:28 AM 09/10/2023   11:21 AM 05/09/2023    9:10 AM 04/22/2023    4:36 PM  GAD 7 : Generalized Anxiety Score  Nervous, Anxious, on Edge 0 0 0 0  Control/stop worrying 0 0 0 0  Worry too much - different things 0 0 0 0  Trouble relaxing 0 0 0 0  Restless 0 0 0 0  Easily annoyed or irritable 0 0 0 0  Afraid - awful might happen 0 0 0 0  Total GAD 7 Score 0 0 0 0  Anxiety Difficulty  Not difficult at all Not difficult at all Not difficult at all Not difficult at all       12/19/2023   10:28 AM 09/10/2023   11:21 AM 05/09/2023    9:10 AM  Depression screen PHQ 2/9  Decreased Interest 0 0 0  Down, Depressed, Hopeless 0 0 0  PHQ - 2 Score 0 0 0  Altered sleeping 0 0 0  Tired, decreased energy 0 0 0  Change in appetite 0 0 0  Feeling bad or failure about yourself  0 0 0  Trouble concentrating 0 0 0  Moving slowly or fidgety/restless 0 0 0  Suicidal thoughts 0 0 0  PHQ-9 Score 0 0 0  Difficult doing work/chores Not difficult at all Not difficult at all Not difficult at all    BP Readings from Last 3 Encounters:  12/19/23 124/70  09/10/23 132/84  05/09/23 102/76    Physical Exam Vitals and nursing note reviewed.  Constitutional:      General: She is not in acute distress.    Appearance: She is not diaphoretic.  HENT:     Head: Normocephalic and atraumatic.     Right Ear: Tympanic membrane and external ear normal.     Left Ear: Tympanic membrane and external ear normal.      Nose: Nose normal.     Mouth/Throat:     Mouth: Mucous membranes are moist.  Eyes:     General:        Right eye: No discharge.        Left eye: No discharge.     Conjunctiva/sclera: Conjunctivae normal.     Pupils: Pupils are equal, round, and reactive to light.  Neck:     Thyroid: No thyromegaly.     Vascular: No JVD.  Cardiovascular:     Rate and Rhythm: Normal rate and regular rhythm.     Heart sounds: Normal heart sounds. No murmur heard.    No friction rub. No gallop.  Pulmonary:     Effort: Pulmonary effort is normal.     Breath sounds: Normal breath sounds. No wheezing, rhonchi or rales.  Abdominal:     General: Bowel sounds are normal.     Palpations: Abdomen is soft. There is no mass.     Tenderness: There is no abdominal tenderness. There is no guarding.  Musculoskeletal:        General: Normal range of motion.     Cervical back: Normal range of motion and neck supple.  Lymphadenopathy:     Cervical: No cervical adenopathy.  Skin:    General: Skin is warm and dry.  Neurological:     Mental Status: She is alert.     Deep Tendon Reflexes: Reflexes are normal and symmetric.     Wt Readings from Last 3 Encounters:  12/19/23 175 lb (79.4 kg)  09/10/23 172 lb (78 kg)  05/09/23 167 lb (75.8 kg)    BP 124/70   Pulse 64   Ht 5\' 3"  (1.6 m)   Wt 175 lb (79.4 kg)   BMI 31.00 kg/m   Assessment and Plan:  1. Essential hypertension (Primary) Chronic.  Controlled.  Stable.  Blood pressure 124/70.  Asymptomatic.  Tolerating medication well.  Will continue current dosings of chlorthalidone 25 mg once a day and losartan 100 mg once a day.  Will check CMP for electrolytes and GFR.  We will recheck patient in 6 months. - chlorthalidone (HYGROTON) 25 MG tablet; TAKE 1/2 TABLET(12.5MG   TOTAL) DAILY.  Dispense: 45 tablet; Refill: 1 - losartan (COZAAR) 100 MG tablet; Take 1 tablet (100 mg total) by mouth daily.  Dispense: 90 tablet; Refill: 1 - potassium chloride  (KLOR-CON) 10 MEQ tablet; Take 2 tablets (20 mEq total) by mouth daily.  Dispense: 180 tablet; Refill: 1 - Comprehensive metabolic panel  2. Prediabetes Chronic.  Controlled.  Stable.  Continue metformin 500 mg once a day.  Will check A1c for current level of control prediabetes. - metFORMIN (GLUCOPHAGE) 500 MG tablet; Take 1 tablet (500 mg total) by mouth daily.  Dispense: 90 tablet; Refill: 1 - Hemoglobin A1c  3. 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  Asymptomatic.  Tolerating all medications well.  Will continue rosuvastatin 40 mg once a day.  As well as controlling her diabetes and blood pressure. - rosuvastatin (CRESTOR) 40 MG tablet; Take 1 tablet (40 mg total) by mouth daily.  Dispense: 90 tablet; Refill: 1  4. Uses emotional support animal Told patient that we will not provide letters of verification of emotional support and that I have not trained enough to make this decision and this would have to come from a behavioral side of medicine if a letter is can be generated at all. - Ambulatory referral to Psychiatry    Elizabeth Sauer, MD

## 2023-12-20 ENCOUNTER — Encounter: Payer: Self-pay | Admitting: Family Medicine

## 2023-12-20 LAB — COMPREHENSIVE METABOLIC PANEL
ALT: 17 [IU]/L (ref 0–32)
AST: 22 [IU]/L (ref 0–40)
Albumin: 4.4 g/dL (ref 3.9–4.9)
Alkaline Phosphatase: 60 [IU]/L (ref 44–121)
BUN/Creatinine Ratio: 16 (ref 12–28)
BUN: 18 mg/dL (ref 8–27)
Bilirubin Total: 0.9 mg/dL (ref 0.0–1.2)
CO2: 27 mmol/L (ref 20–29)
Calcium: 10.1 mg/dL (ref 8.7–10.3)
Chloride: 101 mmol/L (ref 96–106)
Creatinine, Ser: 1.1 mg/dL — ABNORMAL HIGH (ref 0.57–1.00)
Globulin, Total: 3.2 g/dL (ref 1.5–4.5)
Glucose: 78 mg/dL (ref 70–99)
Potassium: 3.8 mmol/L (ref 3.5–5.2)
Sodium: 142 mmol/L (ref 134–144)
Total Protein: 7.6 g/dL (ref 6.0–8.5)
eGFR: 56 mL/min/{1.73_m2} — ABNORMAL LOW (ref >59)

## 2023-12-20 LAB — HEMOGLOBIN A1C
Est. average glucose Bld gHb Est-mCnc: 134 mg/dL
Hgb A1c MFr Bld: 6.3 % — ABNORMAL HIGH (ref 4.8–5.6)

## 2023-12-24 ENCOUNTER — Ambulatory Visit: Payer: 59 | Admitting: Physician Assistant

## 2024-02-03 ENCOUNTER — Telehealth: Payer: Self-pay | Admitting: Cardiovascular Disease

## 2024-02-03 NOTE — Telephone Encounter (Signed)
   Pt c/o of Chest Pain: STAT if active CP, including tightness, pressure, jaw pain, radiating pain to shoulder/upper arm/back, CP unrelieved by Nitro. Symptoms reported of SOB, nausea, vomiting, sweating.  1. Are you having CP right now? no, more like a "pinching" pain about 2 hrs ago for the first time   2. Are you experiencing any other symptoms (ex. SOB, nausea, vomiting, sweating)? no   3. Is your CP continuous or coming and going? N/a   4. Have you taken Nitroglycerin? No, pt took bp meds and asprin today   5. How long have you been experiencing CP? N/a    6. If NO CP at time of call then end call with telling Pt to call back or call 911 if Chest pain returns prior to return call from triage team.

## 2024-02-03 NOTE — Telephone Encounter (Signed)
 Spoke with pt, this morning while walking down the hallway she developed a sharpe, pinching pain. She reports it came and went quickly. She has had these pinching before but it was not as sharpe. She reports she feels better now. No SOB or other symptoms during episode. She reports she was very busy this morning. Gaynelle Adu has a follow up appointment Thursday this week and will call prior to appointment with concerns. Pt agreed with this plan.

## 2024-02-05 NOTE — Progress Notes (Unsigned)
 Cardiology Office Note    Date:  02/06/2024   ID:  Kelsey Terry, DOB 16-May-1961, MRN [id number]  PCP:  Duanne Limerick, MD  Cardiologist:  Lorine Bears, MD  Electrophysiologist:  None   Chief Complaint: Follow up  History of Present Illness:   Kelsey Terry is a 63 y.o. female with history of CAD, PAD, pericarditis status post pericardiocentesis in 2012 in [place], [place], HTN, HLD, and lymphedema who presents for follow-up of CAD and PAD.   She was previously followed by cardiology in [place]. She reported being admitted to [facility] in [place], [place] previously with substernal chest pain with workup revealing pericarditis. She underwent successful pericardiocentesis. She relocated to [place] to be closer to her daughter.  She was admitted with an NSTEMI in 04/2018.  LHC showed severe two-vessel CAD involving the proximal LAD and mid RCA.  She underwent successful PCI/DES to both vessels.  Echo at that time showed normal LV systolic function.  She has not required ischemic evaluation since.  She was noted to have peripheral arterial disease.  ABI was moderately reduced bilaterally with evidence of bilateral SFA occlusion.  She has been managed medically in the context of no claudication.  Outpatient cardiac monitoring in 2019 showed a predominant rhythm of sinus with 1 run of SVT lasting 30 seconds and a 5 beat run of NSVT with occasional PVCs.  She was last seen in the office in 06/2022 and was doing well from a cardiac perspective.  She remained active at baseline.  No changes in pharmacotherapy were indicated at that time.  She comes in doing well from a cardiac perspective.  She notes an episode of sharp substernal chest pain that occurred on 02/03/2024 while walking down the hall.  This episode was short-lived, though it was followed by a deeper substernal chest pressure that radiated towards the left shoulder and persisted throughout the day.  Symptoms subsequently self  resolved.  She has been without symptoms of chest pain since.  No associated dyspnea, dizziness, presyncope, or syncope.  She does not recall what her symptoms felt like at the time of her NSTEMI in 2019.  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  Continues to use lymphedema pumps on a nightly basis for approximately 1 hour.  Adherent and tolerating cardiac medications.  She has been out of rosuvastatin for the past couple of days.  Currently without symptoms of chest pain.   Labs independently reviewed: 11/2023 - BUN 18, serum creatinine 1.1, potassium 3.8, albumin 4.4, AST/ALT normal, A1c 6.3 04/2023 - TC 168, TG 60, HDL 63, LDL 93, TSH normal, Hgb 12.4, PLT 169  Past Medical History:  Diagnosis Date   Allergies    Asthma    Blood dyscrasia    blood clot in right leg   CAD (coronary artery disease) 05/05/2019   CAD in native artery    a. NSTEMI 05/16/18: LHC 05/16/18 - ost-pLAD 95% s/p PCI/DES, mLAD 30%, ostD1 70%, ost-pLCx 30%, mRCA 90%, dRCA 20%; b. staged PCI/DES to [facility] 7/1   Complication of anesthesia    patient stated she had "some kind of reaction to anesthesia, "thought she was having a stroke" during her second cath procedure.   Diabetes mellitus without complication (HCC)    pre-diabetes   Diastolic dysfunction    a. TTE 05/16/18: EF 55-60%, no RWMA, Gr1DD, trivial AI, calcified mitral annulus, mild MR, mildly dilated LA   Dyspnea    with activity  Hypertension    Lymphedema    LLE   Pericarditis    a. ~ 2012 in [place], [place] s/p pericardiocentesis    Wears contact lenses     Past Surgical History:  Procedure Laterality Date   ABDOMINAL HYSTERECTOMY     CESAREAN SECTION     COLONOSCOPY WITH PROPOFOL N/A 08/13/2019   Procedure: COLONOSCOPY WITH BIOPSIES;  Surgeon: Midge Minium, MD;  Location: [facility];  Service: Endoscopy;  Laterality: N/A;   CORONARY STENT INTERVENTION N/A 05/16/2018   Procedure: CORONARY STENT INTERVENTION;   Surgeon: Iran Ouch, MD;  Location: [facility] INVASIVE CV LAB;  Service: Cardiovascular;  Laterality: N/A;   CORONARY STENT INTERVENTION N/A 05/19/2018   Procedure: CORONARY STENT INTERVENTION;  Surgeon: Iran Ouch, MD;  Location: [facility] INVASIVE CV LAB;  Service: Cardiovascular;  Laterality: N/A;   LEFT HEART CATH AND CORONARY ANGIOGRAPHY N/A 05/16/2018   Procedure: LEFT HEART CATH AND CORONARY ANGIOGRAPHY;  Surgeon: Iran Ouch, MD;  Location: [facility] INVASIVE CV LAB;  Service: Cardiovascular;  Laterality: N/A;   PERICARDIOCENTESIS     POLYPECTOMY N/A 08/13/2019   Procedure: POLYPECTOMY;  Surgeon: Midge Minium, MD;  Location: [facility];  Service: Endoscopy;  Laterality: N/A;    Current Medications: Current Meds  Medication Sig   aspirin EC (ASPIRIN LOW DOSE) 81 MG tablet TAKE 1 TABLET DAILY   CALCIUM CITRATE-VITAMIN D PO Take 1 tablet by mouth 2 (two) times daily.   chlorthalidone (HYGROTON) 25 MG tablet TAKE 1/2 TABLET(12.5MG      TOTAL) DAILY.   losartan (COZAAR) 100 MG tablet Take 1 tablet (100 mg total) by mouth daily.   LUMIGAN 0.01 % SOLN 1 drop at bedtime.   metFORMIN (GLUCOPHAGE) 500 MG tablet Take 1 tablet (500 mg total) by mouth daily.   Multiple Vitamin (MULTIVITAMIN) tablet Take 1 tablet by mouth daily.   potassium chloride (KLOR-CON) 10 MEQ tablet Take 2 tablets (20 mEq total) by mouth daily.   rosuvastatin (CRESTOR) 40 MG tablet Take 1 tablet (40 mg total) by mouth daily.    Allergies:   Atrovent nasal spray [ipratropium], Dorzolamide, Latanoprost, Timolol, Amoxicillin, and Erythromycin   Social History   Socioeconomic History   Marital status: Widowed    Spouse name: Not on file   Number of children: Not on file   Years of education: Not on file   Highest education level: Not on file  Occupational History   Not on file  Tobacco Use   Smoking status: Never   Smokeless tobacco: Never  Vaping Use   Vaping status: Never Used  Substance and Sexual  Activity   Alcohol use: Never   Drug use: Never   Sexual activity: Not Currently  Other Topics Concern   Not on file  Social History Narrative   Not on file   Social Drivers of Health   Financial Resource Strain: High Risk (06/16/2019)   Overall Financial Resource Strain (CARDIA)    Difficulty of Paying Living Expenses: Very hard  Food Insecurity: Food Insecurity Present (06/16/2019)   Hunger Vital Sign    Worried About Running Out of Food in the Last Year: Often true    Ran Out of Food in the Last Year: Often true  Transportation Needs: No Transportation Needs (06/16/2019)   PRAPARE - Administrator, Civil Service (Medical): No    Lack of Transportation (Non-Medical): No  Physical Activity: Unknown (06/16/2019)   Exercise Vital Sign    Days of Exercise per  Week: 3 days    Minutes of Exercise per Session: Not on file  Stress: No Stress Concern Present (06/16/2019)   [employer] of Occupational Health - Occupational Stress Questionnaire    Feeling of Stress : Only a little  Social Connections: Moderately Integrated (06/16/2019)   Social Connection and Isolation Panel [NHANES]    Frequency of Communication with Friends and Family: More than three times a week    Frequency of Social Gatherings with Friends and Family: More than three times a week    Attends Religious Services: More than 4 times per year    Active Member of [employer] or Organizations: Yes    Attends Banker Meetings: Never    Marital Status: Widowed     Family History:  The patient's family history includes Diabetes in her brother; Hypertension in her mother; Pancreatic cancer in her father. There is no history of Breast cancer.  ROS:   12-point review of systems is negative unless otherwise noted in the HPI.   EKGs/Labs/Other Studies Reviewed:    Studies reviewed were summarized above. The additional studies were reviewed today:  Outpatient cardiac monitor 06/2018: Normal sinus  rhythm with no evidence of atrial fibrillation. 1 run of SVT lasting 30 seconds. 5 beat run of ventricular tachycardia. Occasional PVCs. __________   ABIs 08/05/2018: ABI/TBIToday's ABIToday's TBIPrevious ABIPrevious TBI  +-------+-----------+-----------+------------+------------+  Right  0.69       0.37                                 +-------+-----------+-----------+------------+------------+  Left   0.71       0.35                                 +-------+-----------+-----------+------------+------------+   Waveforms suggestive of bilateral inflow disease. See arterial duplex and  aorto-iliac images     Final Interpretation:  Right: Resting right ankle-brachial index indicates moderate right lower  extremity arterial disease. The right toe-brachial index is abnormal.   Left: Resting left ankle-brachial index indicates moderate left lower  extremity arterial disease. The left toe-brachial index is abnormal.  __________   Lower extremity arterial ultrasound 08/05/2018: Final Interpretation:  Right: Total occlusion noted in the superficial femoral artery. Total  occlusion noted in the superficial femoral artery and/or popliteal artery.  Total occlusion noted in the peroneal artery.   Left: Total occlusion noted in the superficial femoral artery and/or  popliteal artery. Total occlusion noted in the peroneal artery.   Aorto-iliac images were somewhat limited. The right CIA was not seen at  all due to overlying bowel gas.  __________   [facility] 05/19/2018: Previously placed Ost LAD to Prox LAD drug-eluting stents are widely patent. Balloon angioplasty was performed. Mid LAD lesion is 30% stenosed. Ost 1st Diag lesion is 70% stenosed. Ost Cx to Prox Cx lesion is 30% stenosed. Dist RCA lesion is 30% stenosed. Mid RCA lesion is 90% stenosed. A drug-eluting stent was successfully placed using a STENT SIERRA 3.25 X 18 MM. Post intervention, there is a 0% residual  stenosis.   1. Successful angioplasty and drug-eluting stent placement to the mid right coronary artery. 2.  Vagal reaction during closure device deployment responded well to atropine, IV fluids and Zofran.     Recommendations: Continue dual antiplatelet therapy for at least one year.  Aggressive treatment of risk factors.  I started the patient on rosuvastatin given the possible side effect of atorvastatin. Right femoral artery angiography showed evidence of occluded SFA which seems to be chronic.  I will evaluate the patient for PAD as an outpatient. __________   Carotid artery ultrasound 05/17/2018: IMPRESSION: 1. Mild bilateral carotid bifurcation plaque resulting in less than 50% diameter stenosis. 2.  Antegrade bilateral vertebral arterial flow. __________   2D echo 05/16/2018: - Left ventricle: The cavity size was normal. There was mild    concentric hypertrophy. Systolic function was normal. The    estimated ejection fraction was in the range of 55% to 60%. Wall    motion was normal; there were no regional wall motion    abnormalities. Doppler parameters are consistent with abnormal    left ventricular relaxation (grade 1 diastolic dysfunction).  - Aortic valve: There was trivial regurgitation.  - Mitral valve: Calcified annulus. There was mild regurgitation.  - Left atrium: The atrium was mildly dilated. __________   LHC 05/16/2018: Mid RCA lesion is 90% stenosed. Dist RCA lesion is 20% stenosed. Ost Cx to Prox Cx lesion is 30% stenosed. Ost LAD to Prox LAD lesion is 95% stenosed. Post intervention, there is a 0% residual stenosis. A drug-eluting stent was successfully placed using a STENT SIERRA 3.25 X 15 MM. Mid LAD lesion is 30% stenosed. Ost 1st Diag lesion is 70% stenosed. A stent was successfully placed.   1. Severe two-vessel coronary artery disease.  The culprit is 95% proximal LAD stenosis.  There is also 90% mid RCA stenosis. 2.  Left ventricular angiography  could not be performed due to difficulty advancing the pigtail catheter as a result of severe radial artery spasm. Obtain an echocardiogram.  3.  Successful angioplasty and drug-eluting stent placement to the proximal LAD.   Recommendations: Dual antiplatelet therapy for at least one year. Blood pressure control and aggressive treatment of risk factors. Staged RCA PCI on Monday.  I might consider the femoral artery approach given radial artery spasm.   EKG:  EKG is ordered today.  The EKG ordered today demonstrates NSR, 62 bpm, no acute ST-T changes  Recent Labs: 04/23/2023: Hemoglobin 12.4; Platelets 169; TSH 1.490 12/19/2023: ALT 17; BUN 18; Creatinine, Ser 1.10; Potassium 3.8; Sodium 142  Recent Lipid Panel    Component Value Date/Time   CHOL 168 05/09/2023 0955   TRIG 60 05/09/2023 0955   HDL 63 05/09/2023 0955   CHOLHDL 2.9 01/20/2020 1137   CHOLHDL 4.2 05/16/2018 0009   VLDL 9 05/16/2018 0009   LDLCALC 93 05/09/2023 0955    PHYSICAL EXAM:    VS:  BP 132/82   Pulse 62   Ht 5\' 3"  (1.6 m)   Wt 176 lb 12.8 oz (80.2 kg)   SpO2 98%   BMI 31.32 kg/m   BMI: Body mass index is 31.32 kg/m.  Physical Exam Vitals reviewed.  Constitutional:      Appearance: She is well-developed.  HENT:     Head: Normocephalic and atraumatic.  Eyes:     General:        Right eye: No discharge.        Left eye: No discharge.  Cardiovascular:     Rate and Rhythm: Normal rate and regular rhythm.     Heart sounds: S1 normal and S2 normal. Heart sounds not distant. No midsystolic click and no opening snap. Murmur heard.     Systolic murmur is present with a grade of 2/6 at the upper left sternal  border.     No friction rub.  Pulmonary:     Effort: Pulmonary effort is normal. No respiratory distress.     Breath sounds: Normal breath sounds. No decreased breath sounds, wheezing, rhonchi or rales.  Chest:     Chest wall: No tenderness.  Musculoskeletal:     Cervical back: Normal range of  motion.     Right lower leg: Edema present.     Left lower leg: Edema present.  Skin:    General: Skin is warm and dry.     Nails: There is no clubbing.  Neurological:     Mental Status: She is alert and oriented to person, place, and time.  Psychiatric:        Speech: Speech normal.        Behavior: Behavior normal.        Thought Content: Thought content normal.        Judgment: Judgment normal.     Wt Readings from Last 3 Encounters:  02/06/24 176 lb 12.8 oz (80.2 kg)  12/19/23 175 lb (79.4 kg)  09/10/23 172 lb (78 kg)     ASSESSMENT & PLAN:   CAD involving the native coronary arteries with precordial pain: Currently without symptoms of angina or cardiac decompensation.  Recent episode of initial chest pain appears atypical, though this was followed by the substernal chest discomfort that persisted throughout the day.  This is setting schedule myocardial PET/CT to evaluate for high risk ischemia.  Obtain echo.  Continue aggressive risk factor modification and secondary prevention including aspirin 81 mg and rosuvastatin 40 mg.  Cardiac murmur: Echo from 2019 with trivial aortic insufficiency and mild mitral regurgitation.  Update echo.  PAD: No symptoms of claudication or nonhealing wounds.  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  Continue regular exercise program.  Remains on aspirin and statin as above.  HTN: Blood pressure is well-controlled in the office today.  She remains on chlorthalidone 12.5 mg and losartan 100 mg.  HLD: LDL 93 in 04/2023 with target LDL less than 70.  She remains on rosuvastatin 40 mg.  Obtain LFT, lipid panel, and direct LDL with recommendation to escalate lipid-lowering therapy as indicated to achieve target LDL.  Chronic lymphedema: Stable.  Continue with lymphedema pumps and compression socks.  Followed by vascular surgery.   Informed Consent   Shared Decision Making/Informed Consent{  The risks  [chest pain, shortness of breath, cardiac arrhythmias, dizziness, blood pressure fluctuations, myocardial infarction, stroke/transient ischemic attack, nausea, vomiting, allergic reaction, radiation exposure, metallic taste sensation and life-threatening complications (estimated to be 1 in 10,000)], benefits (risk stratification, diagnosing coronary artery disease, treatment guidance) and alternatives of a cardiac PET stress test were discussed in detail with Ms. Royse and she agrees to proceed.        Disposition: F/u with Dr. Kirke Corin or an APP in 2 months.   Medication Adjustments/Labs and Tests Ordered: Current medicines are reviewed at length with the patient today.  Concerns regarding medicines are outlined above. Medication changes, Labs and Tests ordered today are summarized above and listed in the Patient Instructions accessible in Encounters.   Signed, Eula Listen, PA-C 02/06/2024 1:04 PM     [facility] [place] [address] [place], [place] [address] [phone]

## 2024-02-06 ENCOUNTER — Encounter: Payer: Self-pay | Admitting: Physician Assistant

## 2024-02-06 ENCOUNTER — Ambulatory Visit: Payer: Self-pay | Attending: Physician Assistant | Admitting: Physician Assistant

## 2024-02-06 VITALS — BP 132/82 | HR 62 | Ht 63.0 in | Wt 176.8 lb

## 2024-02-06 DIAGNOSIS — I739 Peripheral vascular disease, unspecified: Secondary | ICD-10-CM

## 2024-02-06 DIAGNOSIS — R072 Precordial pain: Secondary | ICD-10-CM | POA: Diagnosis not present

## 2024-02-06 DIAGNOSIS — Z79899 Other long term (current) drug therapy: Secondary | ICD-10-CM

## 2024-02-06 DIAGNOSIS — R011 Cardiac murmur, unspecified: Secondary | ICD-10-CM

## 2024-02-06 DIAGNOSIS — I25118 Atherosclerotic heart disease of native coronary artery with other forms of angina pectoris: Secondary | ICD-10-CM

## 2024-02-06 DIAGNOSIS — I10 Essential (primary) hypertension: Secondary | ICD-10-CM

## 2024-02-06 DIAGNOSIS — I890 Lymphedema, not elsewhere classified: Secondary | ICD-10-CM

## 2024-02-06 DIAGNOSIS — E785 Hyperlipidemia, unspecified: Secondary | ICD-10-CM

## 2024-02-06 NOTE — Patient Instructions (Signed)
 Medication Instructions:  Your Physician recommend you continue on your current medication as directed.    *If you need a refill on your cardiac medications before your next appointment, please call your pharmacy*   Lab Work: Your provider would like for you to have following labs drawn today Lipid panel, LFT , and LDL direct.   If you have labs (blood work) drawn today and your tests are completely normal, you will receive your results only by: MyChart Message (if you have MyChart) OR A paper copy in the mail If you have any lab test that is abnormal or we need to change your treatment, we will call you to review the results.   Testing/Procedures: Your physician has requested that you have an echocardiogram. Echocardiography is a painless test that uses sound waves to create images of your heart. It provides your doctor with information about the size and shape of your heart and how well your heart's chambers and valves are working.   You may receive an ultrasound enhancing agent through an IV if needed to better visualize your heart during the echo. This procedure takes approximately one hour.  There are no restrictions for this procedure.  This will take place at [address] [facility]) [address], [place] [address]  Please note: We ask at that you not bring children with you during ultrasound (echo/ vascular) testing. Due to room size and safety concerns, children are not allowed in the ultrasound rooms during exams. Our front office staff cannot provide observation of children in our lobby area while testing is being conducted. An adult accompanying a patient to their appointment will only be allowed in the ultrasound room at the discretion of the ultrasound technician under special circumstances. We apologize for any inconvenience.   Please report to Radiology at [facility] Main Entrance, medical mall, 30 mins prior to your test.  [address]  [place], [place]  How to Prepare for Your Cardiac PET/CT Stress Test:  Nothing to eat or drink, except water, 3 hours prior to arrival time.  NO caffeine/decaffeinated products, or chocolate 12 hours prior to arrival. (Please note decaffeinated beverages (teas/coffees) still contain caffeine).  If you have caffeine within 12 hours prior, the test will need to be rescheduled.  Medication instructions: Do not take erectile dysfunction medications for 72 hours prior to test (sildenafil, tadalafil) Do not take nitrates (isosorbide mononitrate, Ranexa) the day before or day of test Do not take tamsulosin the day before or morning of test Hold theophylline containing medications for 12 hours. Hold Dipyridamole 48 hours prior to the test.  Diabetic Preparation: If able to eat breakfast prior to 3 hour fasting, you may take all medications, including your insulin. Do not worry if you miss your breakfast dose of insulin - start at your next meal. If you do not eat prior to 3 hour fast-Hold all diabetes (oral and insulin) medications. Patients who wear a continuous glucose monitor MUST remove the device prior to scanning.  You may take your remaining medications with water.  NO perfume, cologne or lotion on chest or abdomen area. FEMALES - Please avoid wearing dresses to this appointment.  Total time is 1 to 2 hours; you may want to bring reading material for the waiting time.  In preparation for your appointment, medication and supplies will be purchased.  Appointment availability is limited, so if you need to cancel or reschedule, please call the Radiology Department Scheduler at [phone] 24  hours in advance to avoid a cancellation fee of $100.00  What to Expect When you Arrive:  Once you arrive and check in for your appointment, you will be taken to a preparation room within the Radiology Department.  A technologist or Nurse will obtain your medical history, verify that you are  correctly prepped for the exam, and explain the procedure.  Afterwards, an IV will be started in your arm and electrodes will be placed on your skin for EKG monitoring during the stress portion of the exam. Then you will be escorted to the PET/CT scanner.  There, staff will get you positioned on the scanner and obtain a blood pressure and EKG.  During the exam, you will continue to be connected to the EKG and blood pressure machines.  A small, safe amount of a radioactive tracer will be injected in your IV to obtain a series of pictures of your heart along with an injection of a stress agent.    After your Exam:  It is recommended that you eat a meal and drink a caffeinated beverage to counter act any effects of the stress agent.  Drink plenty of fluids for the remainder of the day and urinate frequently for the first couple of hours after the exam.  Your doctor will inform you of your test results within 7-10 business days.  For more information and frequently asked questions, please visit our website: [email or web address]  For questions about your test or how to prepare for your test, please call: Cardiac Imaging Nurse Navigators Office: [phone]    Follow-Up: At [facility], you and your health needs are our priority.  As part of our continuing mission to provide you with exceptional heart care, we have created designated Provider Care Teams.  These Care Teams include your primary Cardiologist (physician) and Advanced Practice Providers (APPs -  Physician Assistants and Nurse Practitioners) who all work together to provide you with the care you need, when you need it.   Your next appointment:   2 month(s) -- AFTER your tests  Provider:   You may see Lorine Bears, MD or Eula Listen, PA-C

## 2024-02-07 ENCOUNTER — Other Ambulatory Visit: Payer: Self-pay

## 2024-02-07 DIAGNOSIS — E785 Hyperlipidemia, unspecified: Secondary | ICD-10-CM

## 2024-02-07 DIAGNOSIS — I10 Essential (primary) hypertension: Secondary | ICD-10-CM

## 2024-02-07 LAB — HEPATIC FUNCTION PANEL
ALT: 17 IU/L (ref 0–32)
AST: 25 IU/L (ref 0–40)
Albumin: 4.4 g/dL (ref 3.9–4.9)
Alkaline Phosphatase: 60 IU/L (ref 44–121)
Bilirubin Total: 0.9 mg/dL (ref 0.0–1.2)
Bilirubin, Direct: 0.25 mg/dL (ref 0.00–0.40)
Total Protein: 7.6 g/dL (ref 6.0–8.5)

## 2024-02-07 LAB — LIPID PANEL
Chol/HDL Ratio: 2.5 ratio (ref 0.0–4.4)
Cholesterol, Total: 156 mg/dL (ref 100–199)
HDL: 62 mg/dL
LDL Chol Calc (NIH): 81 mg/dL (ref 0–99)
Triglycerides: 65 mg/dL (ref 0–149)
VLDL Cholesterol Cal: 13 mg/dL (ref 5–40)

## 2024-02-07 LAB — LDL CHOLESTEROL, DIRECT: LDL Direct: 90 mg/dL (ref 0–99)

## 2024-02-11 MED ORDER — EZETIMIBE 10 MG PO TABS: 10.0000 mg | ORAL_TABLET | Freq: Every day | ORAL | 3 refills | Status: DC

## 2024-02-13 ENCOUNTER — Telehealth: Payer: Self-pay | Admitting: Pharmacy Technician

## 2024-02-13 NOTE — Telephone Encounter (Signed)
 Pharmacy Patient Advocate Encounter   Received notification from  advice  that prior authorization for ezetimibe is required/requested.   Insurance verification completed.   The patient is insured through [facility] .   Per test claim: PA required; PA submitted to above mentioned insurance via CoverMyMeds Key/confirmation #/EOC [id number] Status is pending

## 2024-02-17 ENCOUNTER — Other Ambulatory Visit (HOSPITAL_COMMUNITY): Payer: Self-pay

## 2024-02-17 NOTE — Telephone Encounter (Signed)
 Pharmacy Patient Advocate Encounter  Received notification from [facility] that Prior Authorization for ezetimibe has been APPROVED from 02/17/24 to 02/16/25. Unable to obtain price due to refill too soon rejection, last fill date 02/17/24 next available fill date6/11/25   PA #/Case ID/Reference #: [id number]

## 2024-02-18 ENCOUNTER — Other Ambulatory Visit: Payer: Self-pay | Admitting: Emergency Medicine

## 2024-02-18 DIAGNOSIS — E785 Hyperlipidemia, unspecified: Secondary | ICD-10-CM

## 2024-02-18 DIAGNOSIS — I10 Essential (primary) hypertension: Secondary | ICD-10-CM

## 2024-02-28 ENCOUNTER — Telehealth: Payer: Self-pay | Admitting: Physician Assistant

## 2024-02-28 NOTE — Telephone Encounter (Signed)
 Patient states she received a call from [facility] regarding labs that [facility], [place] ordered for her - lipid panel and a hepatic function panel. She states that labs were done while she was at the office on 3/20, and would like to make sure that she is supposed to have another set of these labs done as ordered.   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

## 2024-02-29 NOTE — Telephone Encounter (Signed)
 She does not need repeat labs until the end of May/early June (fasting lipid and LFT).

## 2024-03-03 ENCOUNTER — Ambulatory Visit: Attending: Physician Assistant

## 2024-03-03 DIAGNOSIS — R072 Precordial pain: Secondary | ICD-10-CM

## 2024-03-04 LAB — ECHOCARDIOGRAM COMPLETE
AR max vel: 2.02 cm2
AV Area VTI: 2.12 cm2
AV Area mean vel: 2.04 cm2
AV Mean grad: 5 mmHg
AV Peak grad: 8.8 mmHg
AV Vena cont: 0.3 cm
Ao pk vel: 1.49 m/s
Area-P 1/2: 3.77 cm2
Calc EF: 61.5 %
P 1/2 time: 470 ms
S' Lateral: 3.2 cm
Single Plane A2C EF: 60.5 %
Single Plane A4C EF: 60.4 %

## 2024-03-04 NOTE — Telephone Encounter (Signed)
 The patient has been notified of the [facility] message: "She does not need repeat labs until the end of May/early June (fasting lipid and LFT)."  Pt verbalized understanding. All questions (if any) were answered.Pt reminded of upcoming appt and encouraged to call if any concerns or questions arise

## 2024-03-23 ENCOUNTER — Telehealth: Payer: Self-pay | Admitting: Physician Assistant

## 2024-03-23 DIAGNOSIS — R072 Precordial pain: Secondary | ICD-10-CM

## 2024-03-23 NOTE — Telephone Encounter (Signed)
 Patient states her insurance has denied her CT scan that she was supposed to have this Thursday. Insurance states there was not a reason why she needed this test . Please call to discuss other options

## 2024-03-23 NOTE — Telephone Encounter (Signed)
 The patient has been notified of the plan to do lexiscan. Pt verbalized understanding. All questions were answered.  Pt encouraged to call if any concerns or questions arise

## 2024-03-23 NOTE — Telephone Encounter (Signed)
-----   Message from Varney Gentleman sent at 03/22/2024  3:15 PM EDT ----- Regarding: RE: Martene Skye,  Please change the order to a Lexiscan Myoview. Diagnosis: Precordial pain.  Thanks,  Ryan ----- Message ----- From: Coley Davis Sent: 03/20/2024  12:45 PM EDT To: Dallas Due, RN; Wenona Hamilton, MD; # Subject: Miranda America                                    Hello,   [facility] has denied pt auth for PET scan. I sent the results of last OV and Echo when submitting. Are there any specific clinicals to send with an appeal?   Katelyn, are you able to take pt off schedule until we find a resolve?   Following are the details of denial.  On 03/19/2024, we received a request for authorization for your care. We have reviewed the request for medical necessity. This letter is to notify you that [employer] will not cover the service requested because it does not meet medical necessity criteria. Below is the clinical rationale for our denial. The specific service(s) denied are detailed on the following page.  Clinical detail and Criteria or guideline referenced: Your doctor told us  that there is a concern related to the blood vessels in your heart. An imaging study was asked for. We cannot approve this request because: Imaging can be done when needed for one of the reasons listed below. This would be when you have symptoms of chest pain that are new, recurring or getting worse, shortness of breath when you exert yourself, or feeling overtired after being active.  You had findings on an electrocardiogram (ECG) that could make it hard to tell if your heart is getting enough blood supply when you exert yourself. An ECG is a tracing of your heart's actions.  Results of an exercise treadmill test did not provide enough details or were not normal as defined in this guideline.  One of the other reasons listed in this guideline. The notes sent to us  do not show one of these  reasons. Imaging can be done if you have one of these conditions.  You have large breasts or implants.  You are unable to exercise enough to reach a target heart rate due to a physical problem.  One of the other conditions listed in this guideline. The details sent to us  do not describe one of these conditions. Notes from your doctor must show you have pain, pressure or discomfort in your chest, upper belly, shoulder, arm or jaw that occurs with stress (physical or mental). These symptoms must be new, or have returned, persisted, or worsened. The notes must also show these symptoms improved with rest and/or drugs (nitroglycerin ). The notes sent to us  do not show this applies to you. This finding was based on [employer] Cardiac Imaging Guidelines Section(s): Stress Testing with Imaging (CD 1.4) and 1.0 General Guidelines.   Thanks,  [employer]

## 2024-03-23 NOTE — Telephone Encounter (Signed)
 Called patient to discuss upcoming CT - left message to return call to the office

## 2024-03-23 NOTE — Telephone Encounter (Signed)
 Patient returned call - advised that we have a precert team that will try to get CT approved to keep the original appointment  - will call patient once a decision is final - patient verbalized understanding

## 2024-03-24 ENCOUNTER — Telehealth: Payer: Self-pay | Admitting: Physician Assistant

## 2024-03-24 NOTE — Telephone Encounter (Signed)
 ----- Message from Nurse Babette Lesches sent at 03/23/2024  6:22 PM EDT ----- Regarding: RE: Jeanella Milan ordered for pt.    I called Mrs Kinzler and spoke to her about the change in testing, procedure explained, and questions answered.    Please call and schedule her for a lexiscan.  Thanks, Dovie Gell ----- Message ----- From: Egbert Grass Sent: 03/22/2024   3:16 PM EDT To: Dallas Due, RN; Devon Fogo, RN; # Subject: RE: Martene Skye,  Please change the order to a Lexiscan Myoview. Diagnosis: Precordial pain.  Thanks,  Ryan ----- Message ----- From: Coley Davis Sent: 03/20/2024  12:45 PM EDT To: Dallas Due, RN; Wenona Hamilton, MD; # Subject: Miranda America                                    Hello,   [facility] has denied pt auth for PET scan. I sent the results of last OV and Echo when submitting. Are there any specific clinicals to send with an appeal?   Katelyn, are you able to take pt off schedule until we find a resolve?   Following are the details of denial.  On 03/19/2024, we received a request for authorization for your care. We have reviewed the request for medical necessity. This letter is to notify you that [employer] will not cover the service requested because it does not meet medical necessity criteria. Below is the clinical rationale for our denial. The specific service(s) denied are detailed on the following page.  Clinical detail and Criteria or guideline referenced: Your doctor told us  that there is a concern related to the blood vessels in your heart. An imaging study was asked for. We cannot approve this request because: Imaging can be done when needed for one of the reasons listed below. This would be when you have symptoms of chest pain that are new, recurring or getting worse, shortness of breath when you exert yourself, or feeling overtired after being active.  You had findings on an  electrocardiogram (ECG) that could make it hard to tell if your heart is getting enough blood supply when you exert yourself. An ECG is a tracing of your heart's actions.  Results of an exercise treadmill test did not provide enough details or were not normal as defined in this guideline.  One of the other reasons listed in this guideline. The notes sent to us  do not show one of these reasons. Imaging can be done if you have one of these conditions.  You have large breasts or implants.  You are unable to exercise enough to reach a target heart rate due to a physical problem.  One of the other conditions listed in this guideline. The details sent to us  do not describe one of these conditions. Notes from your doctor must show you have pain, pressure or discomfort in your chest, upper belly, shoulder, arm or jaw that occurs with stress (physical or mental). These symptoms must be new, or have returned, persisted, or worsened. The notes must also show these symptoms improved with rest and/or drugs (nitroglycerin ). The notes sent to us  do not show this applies to you. This finding was based on [employer]  Cardiac Imaging Guidelines Section(s): Stress Testing with Imaging (CD 1.4) and 1.0 General Guidelines.   Thanks,  [employer]

## 2024-03-24 NOTE — Telephone Encounter (Signed)
LVM to schedule, please schedule.

## 2024-03-26 ENCOUNTER — Ambulatory Visit

## 2024-03-30 ENCOUNTER — Telehealth: Payer: Self-pay | Admitting: *Deleted

## 2024-03-30 ENCOUNTER — Other Ambulatory Visit: Payer: Self-pay | Admitting: *Deleted

## 2024-03-30 DIAGNOSIS — Z860100 Personal history of colon polyps, unspecified: Secondary | ICD-10-CM

## 2024-03-30 NOTE — Telephone Encounter (Signed)
   Pre-operative Risk Assessment    Patient Name: Kelsey Terry  DOB: Nov 25, 1960 MRN: [id number]   Date of last office visit: 02/06/2024 Date of next office visit: 04/2024     APPT NOTES UPDATED FOR SURGICAL CLEARANCE    Request for Surgical Clearance    Procedure:  COLONOSCOPY  Date of Surgery:  Clearance 08/20/24                                Surgeon:   Surgeon's Group or Practice Name:  [facility] Phone number:  [phone] Fax number:  [phone]   Type of Clearance Requested:   - Medical  - Pharmacy:  Hold Aspirin  NOT INDICATED   Type of Anesthesia:  General    Additional requests/questions:    Berenda Breaker   03/30/2024, 1:43 PM

## 2024-03-30 NOTE — Telephone Encounter (Signed)
 Patient requested prep solution [facility] September closer to procedure date.

## 2024-03-30 NOTE — Telephone Encounter (Signed)
 Will send clearance in August which will be closer to procedure date on 08/20/2024.

## 2024-03-30 NOTE — Telephone Encounter (Signed)
 Gastroenterology Pre-Procedure Review  Request Date: 08/20/2024 Requesting Physician: Dr. Ole Berkeley  PATIENT REVIEW QUESTIONS: The patient responded to the following health history questions as indicated:    1. Are you having any GI issues? no 2. Do you have a personal history of Polyps? yes (last colonoscopy was ) 3. Do you have a family history of Colon Cancer or Polyps? no 4. Diabetes Mellitus? Prediabetes taking metformin  5. Joint replacements in the past 12 months?no 6. Major health problems in the past 3 months?no 7. Any artificial heart valves, MVP, or defibrillator? CAD    MEDICATIONS & ALLERGIES:    Patient reports the following regarding taking any anticoagulation/antiplatelet therapy:   Plavix, Coumadin, Eliquis, Xarelto, Lovenox , Pradaxa, Brilinta , or Effient? no Aspirin ? yes (81 mg)  Patient confirms/reports the following medications:  Current Outpatient Medications  Medication Sig Dispense Refill   aspirin  EC (ASPIRIN  LOW DOSE) 81 MG tablet TAKE 1 TABLET DAILY 90 tablet 3   CALCIUM  CITRATE-VITAMIN D PO Take 1 tablet by mouth 2 (two) times daily.     chlorthalidone  (HYGROTON ) 25 MG tablet TAKE 1/2 TABLET(12.5MG      TOTAL) DAILY. 45 tablet 1   ezetimibe  (ZETIA ) 10 MG tablet Take 1 tablet (10 mg total) by mouth daily. 90 tablet 3   losartan  (COZAAR ) 100 MG tablet Take 1 tablet (100 mg total) by mouth daily. 90 tablet 1   LUMIGAN 0.01 % SOLN 1 drop at bedtime.     metFORMIN  (GLUCOPHAGE ) 500 MG tablet Take 1 tablet (500 mg total) by mouth daily. 90 tablet 1   Multiple Vitamin (MULTIVITAMIN) tablet Take 1 tablet by mouth daily.     potassium chloride  (KLOR-CON ) 10 MEQ tablet Take 2 tablets (20 mEq total) by mouth daily. 180 tablet 1   rosuvastatin  (CRESTOR ) 40 MG tablet Take 1 tablet (40 mg total) by mouth daily. 90 tablet 1   No current facility-administered medications for this visit.    Patient confirms/reports the following allergies:  Allergies  Allergen Reactions    Atrovent  Nasal Spray [Ipratropium]     dizziness   Dorzolamide Other (See Comments)    Chest discomfort   Latanoprost Other (See Comments)    Chest discomfort   Timolol Other (See Comments)    Chest discomfort   Amoxicillin Itching and Other (See Comments)    Reaction: bump in vaginal region Has patient had a PCN reaction causing immediate rash, facial/tongue/throat swelling, SOB or lightheadedness with hypotension: No Has patient had a PCN reaction causing severe rash involving mucus membranes or skin necrosis: No Has patient had a PCN reaction that required hospitalization: No Has patient had a PCN reaction occurring within the last 10 years: No If all of the above answers are "NO", then may proceed with Cephalosporin use.    Erythromycin Hives and Rash    No orders of the defined types were placed in this encounter.   AUTHORIZATION INFORMATION Primary Insurance: 1D#: Group #:  Secondary Insurance: 1D#: Group #:  SCHEDULE INFORMATION: Date: 08/20/2024 Time: Location:  [facility]

## 2024-03-31 NOTE — Addendum Note (Signed)
 Addended by: Triniti Gruetzmacher on: 03/31/2024 10:53 AM   Modules accepted: Orders

## 2024-03-31 NOTE — Telephone Encounter (Signed)
   Name: Kelsey Terry  DOB: [birth year]  MRN: [id number]  Primary Cardiologist: Antionette Kirks, MD  Chart reviewed as part of pre-operative protocol coverage. Because of Derrika Merkin's past medical history and time since last visit, she will require a follow-up in-office visit in order to better assess preoperative cardiovascular risk.  Patient has an office visit scheduled on 05/12/2024 with Varney Gentleman, PA-C. Appointment notes have been updated to reflect need for pre-op evaluation.   Pre-op covering staff:  - Please contact requesting surgeon's office via preferred method (i.e, phone, fax) to inform them of need for appointment prior to surgery.   Morey Ar, NP  03/31/2024, 1:06 PM

## 2024-04-06 ENCOUNTER — Telehealth: Payer: Self-pay | Admitting: *Deleted

## 2024-04-06 NOTE — Telephone Encounter (Signed)
 Need to reschedule patient due to 08/20/2024 is a vacation day for Dr Ole Berkeley.  Called patient and we have reschedule to 09/10/2024.  New instructions will be sent. Prep solution will be sent.

## 2024-04-07 ENCOUNTER — Telehealth: Payer: Self-pay | Admitting: Pharmacy Technician

## 2024-04-07 ENCOUNTER — Other Ambulatory Visit (HOSPITAL_COMMUNITY): Payer: Self-pay

## 2024-04-07 NOTE — Telephone Encounter (Signed)
 Pharmacy Patient Advocate Encounter   Received notification from Onbase that prior authorization for Rosuvastatin  Calcium  40MG  tablets is required/requested.   Insurance verification completed.   The patient is insured through [facility] .   Per test claim: PA required; PA submitted to above mentioned insurance via CoverMyMeds Key/confirmation #/EOC [id number] Status is pending

## 2024-04-08 ENCOUNTER — Other Ambulatory Visit (HOSPITAL_COMMUNITY): Payer: Self-pay

## 2024-04-08 NOTE — Telephone Encounter (Signed)
 Pharmacy Patient Advocate Encounter  Received notification from [facility] that Prior Authorization for Rosuvastatin  Calcium  40MG  tablets has been APPROVED from 04/08/24 to 04/08/25. Ran test claim, Copay is $10.69 for 3 months. This test claim was processed through [facility]- copay amounts may vary at other pharmacies due to pharmacy/plan contracts, or as the patient moves through the different stages of their insurance plan.   PA #/Case ID/Reference #: [id number]

## 2024-04-09 ENCOUNTER — Other Ambulatory Visit (HOSPITAL_COMMUNITY): Payer: Self-pay

## 2024-04-14 ENCOUNTER — Other Ambulatory Visit

## 2024-04-16 ENCOUNTER — Other Ambulatory Visit

## 2024-04-27 ENCOUNTER — Other Ambulatory Visit: Payer: Self-pay | Admitting: Family Medicine

## 2024-04-27 DIAGNOSIS — I10 Essential (primary) hypertension: Secondary | ICD-10-CM

## 2024-04-27 MED ORDER — LOSARTAN POTASSIUM 100 MG PO TABS: 100.0000 mg | ORAL_TABLET | Freq: Every day | ORAL | 0 refills | Status: DC

## 2024-04-27 MED ORDER — POTASSIUM CHLORIDE ER 10 MEQ PO TBCR
20.0000 meq | EXTENDED_RELEASE_TABLET | Freq: Every day | ORAL | 0 refills | Status: DC
Start: 2024-04-27 — End: 2024-05-26

## 2024-04-27 NOTE — Telephone Encounter (Signed)
 Requested medication (s) are due for refill today: yes  Requested medication (s) are on the active medication list: yes  Last refill:  12/19/23  Future visit scheduled: no  Notes to clinic:  [facility] Rochelle Chu [facility]      Requested Prescriptions  Pending Prescriptions Disp Refills   losartan  (COZAAR ) 100 MG tablet 90 tablet 1    Sig: Take 1 tablet (100 mg total) by mouth daily.     Cardiovascular:  Angiotensin Receptor Blockers Failed - 04/27/2024  3:12 PM      Failed - Cr in normal range and within 180 days    Creatinine, Ser  Date Value Ref Range Status  12/19/2023 1.10 (H) 0.57 - 1.00 mg/dL Final         Failed - Valid encounter within last 6 months    Recent Outpatient Visits   None     Future Appointments             In 2 weeks Dunn, Elvia Hammans, PA-C [facility] - K in normal range and within 180 days    Potassium  Date Value Ref Range Status  12/19/2023 3.8 3.5 - 5.2 mmol/L Final         Passed - Patient is not pregnant      Passed - Last BP in normal range    BP Readings from Last 1 Encounters:  02/06/24 132/82          potassium chloride  (KLOR-CON ) 10 MEQ tablet 180 tablet 1    Sig: Take 2 tablets (20 mEq total) by mouth daily.     Endocrinology:  Minerals - Potassium Supplementation Failed - 04/27/2024  3:12 PM      Failed - Cr in normal range and within 360 days    Creatinine, Ser  Date Value Ref Range Status  12/19/2023 1.10 (H) 0.57 - 1.00 mg/dL Final         Failed - Valid encounter within last 12 months    Recent Outpatient Visits   None     Future Appointments             In 2 weeks Dunn, Elvia Hammans, PA-C [facility] - K in normal range and within 360 days    Potassium  Date Value Ref Range Status  12/19/2023 3.8 3.5 - 5.2 mmol/L Final

## 2024-04-27 NOTE — Telephone Encounter (Signed)
 Copied from CRM [id number]. Topic: Clinical - Medication Refill >> Apr 27, 2024  8:52 AM Yvette Hercules M wrote: [facility] [place] [facility]previously mailed, is requesting to swap to physical pharmacy location). Patient is out of Losartan  and needs refill, and needs a new prescription for Potassium.  Medication: Losartan  100MG  / Potassium Chloride  10 MEQ  Has the patient contacted their pharmacy? Yes (Agent: If no, request that the patient contact the pharmacy for the refill. If patient does not wish to contact the pharmacy document the reason why and proceed with request.) (Agent: If yes, when and what did the pharmacy advise?)  This is the patient's preferred pharmacy:  [phone] [place] [place] - [address] [place] [address] Phone: [phone] Fax: [phone]  Is this the correct pharmacy for this prescription? Yes If no, delete pharmacy and type the correct one.   Has the prescription been filled recently? No  Is the patient out of the medication? Yes  Has the patient been seen for an appointment in the last year OR does the patient have an upcoming appointment? No  Can we respond through MyChart? Yes  Agent: Please be advised that Rx refills may take up to 3 business days. We ask that you follow-up with your pharmacy.

## 2024-05-04 ENCOUNTER — Encounter

## 2024-05-09 ENCOUNTER — Encounter: Payer: Self-pay | Admitting: Emergency Medicine

## 2024-05-09 ENCOUNTER — Ambulatory Visit (INDEPENDENT_AMBULATORY_CARE_PROVIDER_SITE_OTHER)

## 2024-05-09 ENCOUNTER — Ambulatory Visit
Admission: EM | Admit: 2024-05-09 | Discharge: 2024-05-09 | Disposition: A | Discharge status: Home / Self Care | Attending: Family Medicine | Admitting: Family Medicine

## 2024-05-09 DIAGNOSIS — M179 Osteoarthritis of knee, unspecified: Secondary | ICD-10-CM | POA: Insufficient documentation

## 2024-05-09 DIAGNOSIS — M25561 Pain in right knee: Secondary | ICD-10-CM

## 2024-05-09 MED ORDER — TRAMADOL HCL 50 MG PO TABS: 50.0000 mg | ORAL_TABLET | Freq: Three times a day (TID) | ORAL | 0 refills | Status: DC | PRN

## 2024-05-09 NOTE — ED Provider Notes (Signed)
 [facility]    CSN: [id number] Arrival date & time: 05/09/24  1059      History   Chief Complaint Chief Complaint  Patient presents with   Knee Pain    right    HPI 63 year old female presents with right knee pain.  Right knee pain 3-week history of right knee pain. Worse with activity and ambulation.  No pain at rest. Pain currently 5/10 in severity. No fall, trauma, injury. Previous x-rays with degenerative changes.  Past Medical History:  Diagnosis Date   Allergies    Asthma    Blood dyscrasia    blood clot in right leg   CAD (coronary artery disease) 05/05/2019   CAD in native artery    a. NSTEMI 05/16/18: LHC 05/16/18 - ost-pLAD 95% s/p PCI/DES, mLAD 30%, ostD1 70%, ost-pLCx 30%, mRCA 90%, dRCA 20%; b. staged PCI/DES to [facility] 7/1   Complication of anesthesia    patient stated she had some kind of reaction to anesthesia, thought she was having a stroke during her second cath procedure.   Diabetes mellitus without complication (HCC)    pre-diabetes   Diastolic dysfunction    a. TTE 05/16/18: EF 55-60%, no RWMA, Gr1DD, trivial AI, calcified mitral annulus, mild MR, mildly dilated LA   Dyspnea    with activity   Hypertension    Lymphedema    LLE   Pericarditis    a. ~ 2012 in [place], [place] s/p pericardiocentesis    Wears contact lenses     Patient Active Problem List   Diagnosis Date Noted   Right knee pain 05/09/2024   Cough 03/31/2022   Hyperlipidemia 03/06/2021   Familial hypercholesterolemia 08/23/2020   Acute bilateral knee pain 01/13/2020   Health care maintenance 11/24/2019   H/O elevated lipids 11/04/2019   Lymphedema 08/26/2019   Chronic venous insufficiency 08/26/2019   Encounter for screening colonoscopy    Polyp of sigmoid colon    Polyp of ascending colon    Lymphedema of left leg 06/16/2019   Prediabetes 06/16/2019   Abnormal laboratory test result 06/16/2019   Prediabetes 05/20/2019   Essential hypertension 05/19/2019    Abscess of left buttock 05/05/2019   Essential hypertension 05/05/2019   CAD (coronary artery disease) 05/05/2019   NSTEMI (non-ST elevated myocardial infarction) (HCC) 05/16/2018    Past Surgical History:  Procedure Laterality Date   ABDOMINAL HYSTERECTOMY     CESAREAN SECTION     COLONOSCOPY WITH PROPOFOL  N/A 08/13/2019   Procedure: COLONOSCOPY WITH BIOPSIES;  Surgeon: Jinny Carmine, MD;  Location: [facility];  Service: Endoscopy;  Laterality: N/A;   CORONARY STENT INTERVENTION N/A 05/16/2018   Procedure: CORONARY STENT INTERVENTION;  Surgeon: Darron Deatrice LABOR, MD;  Location: [facility] INVASIVE CV LAB;  Service: Cardiovascular;  Laterality: N/A;   CORONARY STENT INTERVENTION N/A 05/19/2018   Procedure: CORONARY STENT INTERVENTION;  Surgeon: Darron Deatrice LABOR, MD;  Location: [facility] INVASIVE CV LAB;  Service: Cardiovascular;  Laterality: N/A;   LEFT HEART CATH AND CORONARY ANGIOGRAPHY N/A 05/16/2018   Procedure: LEFT HEART CATH AND CORONARY ANGIOGRAPHY;  Surgeon: Darron Deatrice LABOR, MD;  Location: [facility] INVASIVE CV LAB;  Service: Cardiovascular;  Laterality: N/A;   PERICARDIOCENTESIS     POLYPECTOMY N/A 08/13/2019   Procedure: POLYPECTOMY;  Surgeon: Jinny Carmine, MD;  Location: [facility];  Service: Endoscopy;  Laterality: N/A;    OB History   No obstetric history on file.      Home Medications    Prior  to Admission medications   Medication Sig Start Date End Date Taking? Authorizing Provider  traMADol (ULTRAM) 50 MG tablet Take 1 tablet (50 mg total) by mouth every 8 (eight) hours as needed for severe pain (pain score 7-10) or moderate pain (pain score 4-6). 05/09/24  Yes Winferd Wease G, DO  aspirin  EC (ASPIRIN  LOW DOSE) 81 MG tablet TAKE 1 TABLET DAILY 10/14/23   Abigail Bernardino HERO, PA-C  CALCIUM  CITRATE-VITAMIN D PO Take 1 tablet by mouth 2 (two) times daily.    [provider]  chlorthalidone  (HYGROTON ) 25 MG tablet TAKE 1/2 TABLET(12.5MG      TOTAL) DAILY. 12/19/23   Joshua Cathryne BROCKS, MD  ezetimibe  (ZETIA ) 10 MG tablet Take 1 tablet (10 mg total) by mouth daily. 02/11/24 05/11/24  Abigail Bernardino HERO, PA-C  losartan  (COZAAR ) 100 MG tablet Take 1 tablet (100 mg total) by mouth daily. 04/27/24   Lemon Raisin, MD  LUMIGAN 0.01 % SOLN 1 drop at bedtime. 08/23/23   [provider]  metFORMIN  (GLUCOPHAGE ) 500 MG tablet Take 1 tablet (500 mg total) by mouth daily. 12/19/23 06/16/24  Joshua Cathryne BROCKS, MD  Multiple Vitamin (MULTIVITAMIN) tablet Take 1 tablet by mouth daily.    [provider]  potassium chloride  (KLOR-CON ) 10 MEQ tablet Take 2 tablets (20 mEq total) by mouth daily. 04/27/24   Lemon Raisin, MD  rosuvastatin  (CRESTOR ) 40 MG tablet Take 1 tablet (40 mg total) by mouth daily. 12/19/23   Joshua Cathryne BROCKS, MD    Family History Family History  Problem Relation Age of Onset   Hypertension Mother    Pancreatic cancer Father    Diabetes Brother    Breast cancer Neg Hx     Social History Social History   Tobacco Use   Smoking status: Never   Smokeless tobacco: Never  Vaping Use   Vaping status: Never Used  Substance Use Topics   Alcohol use: Never   Drug use: Never     Allergies   Atrovent  nasal spray [ipratropium], Dorzolamide, Latanoprost, Timolol, Amoxicillin, and Erythromycin   Review of Systems Review of Systems Per HPI  Physical Exam Triage Vital Signs ED Triage Vitals  Encounter Vitals Group     BP 05/09/24 1110 (!) 160/89     Girls Systolic BP Percentile --      Girls Diastolic BP Percentile --      Boys Systolic BP Percentile --      Boys Diastolic BP Percentile --      Pulse Rate 05/09/24 1110 60     Resp 05/09/24 1110 14     Temp 05/09/24 1110 98.7 F (37.1 C)     Temp Source 05/09/24 1110 Oral     SpO2 05/09/24 1110 95 %     Weight 05/09/24 1109 176 lb 12.9 oz (80.2 kg)     Height 05/09/24 1109 5' 3 (1.6 m)     Head Circumference --      Peak Flow --      Pain Score 05/09/24 1109 5     Pain Loc --      Pain  Education --      Exclude from Growth Chart --    Updated Vital Signs BP (!) 160/89 (BP Location: Right Arm)   Pulse 60   Temp 98.7 F (37.1 C) (Oral)   Resp 14   Ht 5' 3 (1.6 m)   Wt 80.2 kg   SpO2 95%   BMI 31.32 kg/m   Visual  Acuity Right Eye Distance:   Left Eye Distance:   Bilateral Distance:    Right Eye Near:   Left Eye Near:    Bilateral Near:     Physical Exam Constitutional:      General: She is not in acute distress.    Appearance: Normal appearance.  HENT:     Head: Normocephalic and atraumatic.   Musculoskeletal:     Comments: Right knee -no appreciable effusion.  Ligaments intact.  No joint line tenderness.   Neurological:     Mental Status: She is alert.      UC Treatments / Results  Labs (all labs ordered are listed, but only abnormal results are displayed) Labs Reviewed - No data to display  EKG   Radiology DG Knee Complete 4 Views Right Result Date: 05/09/2024 EXAM: 4 VIEW(S) XRAY OF THE RIGHT KNEE 05/09/2024 11:49:00 AM COMPARISON: 03/01/2020 CLINICAL HISTORY: Right knee pain x 3 weeks; atraumatic; worse with weight bearing. Patient c/o right knee pain that started about 3 weeks ago. Patient denies falling. FINDINGS: BONES AND JOINTS: No acute fracture. No focal osseous lesion. No joint dislocation. No significant joint effusion. Progressive moderate tricompartmental degenerative changes are present. SOFT TISSUES: The soft tissues are unremarkable. Atherosclerotic changes are present. IMPRESSION: 1. No acute osseous abnormality. 2. Progressive moderate tricompartmental degenerative changes. Electronically signed by: Lonni Necessary MD 05/09/2024 11:51 AM EDT RP Workstation: [id number]    Procedures Procedures (including critical care time)  Medications Ordered in UC Medications - No data to display  Initial Impression / Assessment and Plan / UC Course  I have reviewed the triage vital signs and the nursing notes.  Pertinent labs &  imaging results that were available during my care of the patient were reviewed by me and considered in my medical decision making (see chart for details).    63 year old female presents with knee pain.  X-ray revealed moderate degenerative changes.  Tramadol as directed.  NSAIDs not used in the setting of significant cardiac history.  Advised to see Ortho.  Final Clinical Impressions(s) / UC Diagnoses   Final diagnoses:  Acute pain of right knee     Discharge Instructions      Moderate Arthritis.  Rest, ice, elevation.  Medication as directed.  Please call [facility] clinic Orthopedics [phone]) OR [facility] [phone]) for an appt.    ED Prescriptions     Medication Sig Dispense Auth. Provider   traMADol (ULTRAM) 50 MG tablet Take 1 tablet (50 mg total) by mouth every 8 (eight) hours as needed for severe pain (pain score 7-10) or moderate pain (pain score 4-6). 15 tablet Anhelica Fowers G, DO      I have reviewed the PDMP during this encounter.   Najae Filsaime G, [place] 05/09/24 1349

## 2024-05-09 NOTE — ED Triage Notes (Signed)
 Patient c/o right knee pain that started about 3 weeks ago.  Patient denies falling.

## 2024-05-09 NOTE — Discharge Instructions (Signed)
 Moderate Arthritis.  Rest, ice, elevation.  Medication as directed.  Please call [facility] clinic Orthopedics [phone]) OR [facility] [phone]) for an appt.

## 2024-05-12 ENCOUNTER — Ambulatory Visit: Admitting: Physician Assistant

## 2024-05-12 ENCOUNTER — Encounter: Payer: Self-pay | Admitting: Internal Medicine

## 2024-05-12 ENCOUNTER — Encounter: Payer: Self-pay | Admitting: Student

## 2024-05-12 DIAGNOSIS — Z1231 Encounter for screening mammogram for malignant neoplasm of breast: Secondary | ICD-10-CM

## 2024-05-21 ENCOUNTER — Other Ambulatory Visit: Payer: Self-pay | Admitting: Student

## 2024-05-21 DIAGNOSIS — I10 Essential (primary) hypertension: Secondary | ICD-10-CM

## 2024-05-25 NOTE — Telephone Encounter (Signed)
 Patient calling to follow up on rx request. Advised patient of turnaround time, patient verbalized understanding.

## 2024-05-25 NOTE — Telephone Encounter (Signed)
 Requested medications are due for refill today.  yes  Requested medications are on the active medications list.  yes  Last refill. 04/27/2024 1 month supply  Future visit scheduled.   no  Notes to clinic.  Pt already given a courtesy refill. Dr. Joshua is PCP. Pt last seen 12/19/2023.    Requested Prescriptions  Pending Prescriptions Disp Refills   losartan  (COZAAR ) 100 MG tablet [Pharmacy Med Name: LOSARTAN  POTASSIUM 100 MG TAB] 90 tablet 1    Sig: TAKE 1 TABLET BY MOUTH EVERY DAY     Cardiovascular:  Angiotensin Receptor Blockers Failed - 05/25/2024  2:58 PM      Failed - Cr in normal range and within 180 days    Creatinine, Ser  Date Value Ref Range Status  12/19/2023 1.10 (H) 0.57 - 1.00 mg/dL Final         Failed - Last BP in normal range    BP Readings from Last 1 Encounters:  05/09/24 (!) 160/89         Failed - Valid encounter within last 6 months    Recent Outpatient Visits   None     Future Appointments             In 1 month Dunn, Bernardino HERO, PA-C [facility] - K in normal range and within 180 days    Potassium  Date Value Ref Range Status  12/19/2023 3.8 3.5 - 5.2 mmol/L Final         Passed - Patient is not pregnant       potassium chloride  (KLOR-CON ) 10 MEQ tablet [Pharmacy Med Name: POTASSIUM CL ER 10 MEQ TABLET] 180 tablet 1    Sig: TAKE 2 TABLETS BY MOUTH DAILY     Endocrinology:  Minerals - Potassium Supplementation Failed - 05/25/2024  2:58 PM      Failed - Cr in normal range and within 360 days    Creatinine, Ser  Date Value Ref Range Status  12/19/2023 1.10 (H) 0.57 - 1.00 mg/dL Final         Failed - Valid encounter within last 12 months    Recent Outpatient Visits   None     Future Appointments             In 1 month Dunn, Bernardino HERO, PA-C [facility] - K in normal range and within 360 days    Potassium  Date Value Ref Range Status  12/19/2023 3.8  3.5 - 5.2 mmol/L Final

## 2024-05-27 ENCOUNTER — Other Ambulatory Visit: Payer: Self-pay

## 2024-05-27 ENCOUNTER — Telehealth: Payer: Self-pay

## 2024-05-27 DIAGNOSIS — I10 Essential (primary) hypertension: Secondary | ICD-10-CM

## 2024-05-27 MED ORDER — LOSARTAN POTASSIUM 100 MG PO TABS: 100.0000 mg | ORAL_TABLET | Freq: Every day | ORAL | Status: DC

## 2024-05-27 NOTE — Telephone Encounter (Signed)
 Noted  Pt does not have to pick up the medication. Will try to cancel the order.  KP

## 2024-05-27 NOTE — Telephone Encounter (Signed)
 Copied from CRM [id number]. Topic: Clinical - Prescription Issue >> May 27, 2024 10:21 AM Charlet HERO wrote: Reason for CRM: Patient calling to ck on the stat of med order that was placed on 07/03 per chart Requested medications are due for refill today.  yes   Requested medications are on the active medications list.  yes   Last refill. 04/27/2024 1 month supply   Future visit scheduled.   no   Notes to clinic.  Pt already given a courtesy refill. Dr. Joshua is PCP. Pt last seen 12/19/2023.       Requested Prescriptions Pending Prescriptions Disp Refills   losartan  (COZAAR ) 100 MG tablet [Pharmacy Med Name: LOSARTAN  POTASSIUM 100 MG TAB] 90 tablet 1     Sig: TAKE 1 TABLET BY MOUTH EVERY DAY

## 2024-05-27 NOTE — Telephone Encounter (Signed)
 Copied from CRM [id number]. Topic: Clinical - Prescription Issue >> May 27, 2024 12:39 PM Avram MATSU wrote: Reason for CRM: patient stated she sent in a request to her pharmacy for a refill on losartan  (COZAAR ) 100 MG tablet [[id number]] and would like to cancel it.

## 2024-06-02 ENCOUNTER — Encounter: Payer: Self-pay | Admitting: Student

## 2024-06-02 ENCOUNTER — Ambulatory Visit (INDEPENDENT_AMBULATORY_CARE_PROVIDER_SITE_OTHER): Payer: Self-pay | Admitting: Student

## 2024-06-02 VITALS — BP 114/70 | HR 66 | Ht 63.0 in | Wt 175.2 lb

## 2024-06-02 DIAGNOSIS — I2510 Atherosclerotic heart disease of native coronary artery without angina pectoris: Secondary | ICD-10-CM | POA: Diagnosis not present

## 2024-06-02 DIAGNOSIS — I10 Essential (primary) hypertension: Secondary | ICD-10-CM

## 2024-06-02 DIAGNOSIS — D122 Benign neoplasm of ascending colon: Secondary | ICD-10-CM

## 2024-06-02 DIAGNOSIS — Z1231 Encounter for screening mammogram for malignant neoplasm of breast: Secondary | ICD-10-CM | POA: Diagnosis not present

## 2024-06-02 DIAGNOSIS — I38 Endocarditis, valve unspecified: Secondary | ICD-10-CM | POA: Insufficient documentation

## 2024-06-02 DIAGNOSIS — E876 Hypokalemia: Secondary | ICD-10-CM | POA: Diagnosis not present

## 2024-06-02 DIAGNOSIS — I890 Lymphedema, not elsewhere classified: Secondary | ICD-10-CM

## 2024-06-02 DIAGNOSIS — R7303 Prediabetes: Secondary | ICD-10-CM | POA: Diagnosis not present

## 2024-06-02 DIAGNOSIS — M1711 Unilateral primary osteoarthritis, right knee: Secondary | ICD-10-CM

## 2024-06-02 NOTE — Assessment & Plan Note (Addendum)
 On ASA 81 mg, rosuvastatin  40mg  daily and recently started on zetia . No anginal symptoms today. NM study ordered by cardiology.

## 2024-06-02 NOTE — Assessment & Plan Note (Addendum)
 Has not taken metformin  in the last few months. Denies polydipsia or polyuria. A1c today.

## 2024-06-02 NOTE — Progress Notes (Signed)
 Established Patient Office Visit  Subjective   Patient ID: Kelsey Terry, female    DOB: 1961-02-08  Age: 63 y.o. MRN: [id number]  Chief Complaint  Patient presents with   Establish Care   Hypertension   Kelsey Terry is a 63 y.o. with history listed below who presents for transfer of care. Please refer to problem based charting for further details and assessment and plan of current problem and chronic medical conditions.  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   OA (osteoarthritis) of knee 05/09/2024   Hyperlipidemia 03/06/2021   Familial hypercholesterolemia 08/23/2020   Chronic venous insufficiency 08/26/2019   Polyp of sigmoid colon    Polyp of ascending colon    Lymphedema of left leg 06/16/2019   Prediabetes 05/20/2019   Essential hypertension 05/05/2019   CAD (coronary artery disease) 05/05/2019      ROS Refer to HPI    Objective:     BP 114/70   Pulse 66   Ht 5' 3 (1.6 m)   Wt 175 lb 4 oz (79.5 kg)   SpO2 99%   BMI 31.04 kg/m  BP Readings from Last 3 Encounters:  06/02/24 114/70  05/09/24 (!) 160/89  02/06/24 132/82    Physical Exam Constitutional:      Appearance: Normal appearance.  HENT:     Head: Normocephalic and atraumatic.  Cardiovascular:     Rate and Rhythm: Normal rate and regular rhythm.     Pulses: Normal pulses.     Heart sounds: Murmur (systolic) heard.     No friction rub. No gallop.  Pulmonary:     Effort: Pulmonary effort is normal.     Breath sounds: No rhonchi or rales.  Abdominal:     General: Abdomen is flat. Bowel sounds are normal. There is no distension.     Palpations: Abdomen is soft.     Tenderness: There is no abdominal tenderness.  Musculoskeletal:        General: Normal range of motion.     Right lower leg: No edema.     Left lower leg: Edema present.     Comments: Wearing compression stockings   Skin:    General: Skin is warm and dry.     Capillary Refill:  Capillary refill takes less than 2 seconds.     Comments: No skin breakdown of the LLE  Neurological:     General: No focal deficit present.     Mental Status: She is alert and oriented to person, place, and time.  Psychiatric:        Mood and Affect: Mood normal.        Behavior: Behavior normal.        06/02/2024   10:05 AM 12/19/2023   10:28 AM 09/10/2023   11:21 AM  Depression screen PHQ 2/9  Decreased Interest 0 0 0  Down, Depressed, Hopeless 0 0 0  PHQ - 2 Score 0 0 0  Altered sleeping 0 0 0  Tired, decreased energy 0 0 0  Change in appetite 0 0 0  Feeling bad or failure about yourself  0 0 0  Trouble concentrating 0 0 0  Moving slowly or fidgety/restless 0 0 0  Suicidal thoughts 0 0 0  PHQ-9 Score 0 0 0  Difficult doing work/chores Not difficult at all Not difficult at all Not difficult at all       06/02/2024   10:05 AM 12/19/2023  10:28 AM 09/10/2023   11:21 AM 05/09/2023    9:10 AM  GAD 7 : Generalized Anxiety Score  Nervous, Anxious, on Edge 0 0 0 0  Control/stop worrying 0 0 0 0  Worry too much - different things 0 0 0 0  Trouble relaxing 0 0 0 0  Restless 0 0 0 0  Easily annoyed or irritable 0 0 0 0  Afraid - awful might happen 0 0 0 0  Total GAD 7 Score 0 0 0 0  Anxiety Difficulty Not difficult at all Not difficult at all Not difficult at all Not difficult at all    No results found for any visits on 06/02/24.  Last CBC Lab Results  Component Value Date   WBC 3.2 (L) 04/23/2023   HGB 12.4 04/23/2023   HCT 39.4 04/23/2023   MCV 85 04/23/2023   MCH 26.7 04/23/2023   RDW 13.5 04/23/2023   PLT 169 04/23/2023   Last metabolic panel Lab Results  Component Value Date   GLUCOSE 78 12/19/2023   NA 142 12/19/2023   K 3.8 12/19/2023   CL 101 12/19/2023   CO2 27 12/19/2023   BUN 18 12/19/2023   CREATININE 1.10 (H) 12/19/2023   EGFR 56 (L) 12/19/2023   CALCIUM  10.1 12/19/2023   PHOS 3.2 04/23/2023   PROT 7.6 02/06/2024   ALBUMIN 4.4 02/06/2024    LABGLOB 3.2 12/19/2023   AGRATIO 1.5 03/01/2022   BILITOT 0.9 02/06/2024   ALKPHOS 60 02/06/2024   AST 25 02/06/2024   ALT 17 02/06/2024   ANIONGAP 12 06/27/2020   Last lipids Lab Results  Component Value Date   CHOL 156 02/06/2024   HDL 62 02/06/2024   LDLCALC 81 02/06/2024   LDLDIRECT 90 02/06/2024   TRIG 65 02/06/2024   CHOLHDL 2.5 02/06/2024   Last hemoglobin A1c Lab Results  Component Value Date   HGBA1C 6.3 (H) 12/19/2023      The ASCVD Risk score (Arnett DK, et al., 2019) failed to calculate for the following reasons:   Risk score cannot be calculated because patient has a medical history suggesting prior/existing ASCVD    Assessment & Plan:  Prediabetes Assessment & Plan: Has not taken metformin  in the last few months. Denies polydipsia or polyuria. A1c today.   Orders: -     Hemoglobin A1c  Coronary artery disease involving native coronary artery of native heart without angina pectoris Assessment & Plan: On ASA 81 mg, rosuvastatin  40mg  daily and recently started on zetia . No anginal symptoms today. NM study ordered by cardiology.   Orders: -     Lipid panel  Screening mammogram for breast cancer -     3D Screening Mammogram, Left and Right  Hypokalemia -     Basic metabolic panel with GFR -     Magnesium  Lymphedema of left leg Assessment & Plan: Seeing vascular surgery, uses compression stocking and wraps regularly. Weight and edema is stable, no skin breakdown. Due for follow up in the fall.    Essential hypertension Assessment & Plan: Well controlled on chlorthalidone  and losartan . Does take potassium 40 meq daily. Potassium has been normal lately. Will check BMP and Mg today.    Primary osteoarthritis of right knee Assessment & Plan: Seen in ED for this. No long taking tramadol . Pain has improved with heat and home exercises. Pain is not function limitng. Continue with conservative measures.    Adenomatous polyp of ascending  colon Assessment & Plan: Last coloscopy in  2020 with multiple TA, recall in 2025, she has colonoscopy scheduled for in October.       Return in about 6 months (around 12/03/2024) for HTN, chronic f/u.    Harlene Saddler, MD

## 2024-06-02 NOTE — Assessment & Plan Note (Signed)
 Last coloscopy in 2020 with multiple TA, recall in 2025, she has colonoscopy scheduled for in October.

## 2024-06-02 NOTE — Assessment & Plan Note (Addendum)
 Seen in ED for this. No long taking tramadol . Pain has improved with heat and home exercises. Pain is not function limitng. Continue with conservative measures.

## 2024-06-02 NOTE — Assessment & Plan Note (Signed)
 Well controlled on chlorthalidone  and losartan . Does take potassium 40 meq daily. Potassium has been normal lately. Will check BMP and Mg today.

## 2024-06-02 NOTE — Assessment & Plan Note (Addendum)
 Seeing vascular surgery, uses compression stocking and wraps regularly. Weight and edema is stable, no skin breakdown. Due for follow up in the fall.

## 2024-06-03 ENCOUNTER — Other Ambulatory Visit: Payer: Self-pay | Admitting: Student

## 2024-06-03 DIAGNOSIS — I10 Essential (primary) hypertension: Secondary | ICD-10-CM

## 2024-06-04 NOTE — Telephone Encounter (Signed)
 Left VM message for patient to obtain labs and to call back if she had any questions.

## 2024-06-04 NOTE — Telephone Encounter (Signed)
Please review medication refill  request

## 2024-06-04 NOTE — Telephone Encounter (Signed)
 Requested medication (s) are due for refill today:   Yes  Requested medication (s) are on the active medication list:   Yes  Future visit scheduled:   No.    LOV 06/02/2024 with Dr. Lemon   Last ordered: 05/26/2024 #30, 0 refills  Pharmacy requesting a 90 day supply and a DX Code    Requested Prescriptions  Pending Prescriptions Disp Refills   potassium chloride  (KLOR-CON ) 10 MEQ tablet [Pharmacy Med Name: POTASSIUM CL ER 10 MEQ TAB WAX] 180 tablet 1    Sig: TAKE 2 TABLETS BY MOUTH EVERY DAY     Endocrinology:  Minerals - Potassium Supplementation Failed - 06/04/2024  2:53 PM      Failed - Cr in normal range and within 360 days    Creatinine, Ser  Date Value Ref Range Status  12/19/2023 1.10 (H) 0.57 - 1.00 mg/dL Final         Passed - K in normal range and within 360 days    Potassium  Date Value Ref Range Status  12/19/2023 3.8 3.5 - 5.2 mmol/L Final         Passed - Valid encounter within last 12 months    Recent Outpatient Visits           2 days ago Prediabetes   [facility], [place]       Future Appointments             In 1 month Dunn, Bernardino HERO, PA-C [facility]

## 2024-06-08 ENCOUNTER — Encounter: Payer: Self-pay | Admitting: Student

## 2024-06-10 ENCOUNTER — Ambulatory Visit: Payer: Self-pay | Admitting: Student

## 2024-06-10 DIAGNOSIS — I10 Essential (primary) hypertension: Secondary | ICD-10-CM

## 2024-06-10 LAB — LIPID PANEL
Chol/HDL Ratio: 2.4 ratio (ref 0.0–4.4)
Cholesterol, Total: 134 mg/dL (ref 100–199)
HDL: 56 mg/dL (ref >39)
LDL Chol Calc (NIH): 64 mg/dL (ref 0–99)
Triglycerides: 67 mg/dL (ref 0–149)
VLDL Cholesterol Cal: 14 mg/dL (ref 5–40)

## 2024-06-10 LAB — HEMOGLOBIN A1C
Est. average glucose Bld gHb Est-mCnc: 126 mg/dL
Hgb A1c MFr Bld: 6 % — ABNORMAL HIGH (ref 4.8–5.6)

## 2024-06-10 LAB — BASIC METABOLIC PANEL WITH GFR
BUN/Creatinine Ratio: 15 (ref 12–28)
BUN: 14 mg/dL (ref 8–27)
CO2: 25 mmol/L (ref 20–29)
Calcium: 9.6 mg/dL (ref 8.7–10.3)
Chloride: 104 mmol/L (ref 96–106)
Creatinine, Ser: 0.94 mg/dL (ref 0.57–1.00)
Glucose: 82 mg/dL (ref 70–99)
Potassium: 3.6 mmol/L (ref 3.5–5.2)
Sodium: 145 mmol/L — ABNORMAL HIGH (ref 134–144)
eGFR: 68 mL/min/1.73 (ref >59)

## 2024-06-10 LAB — MAGNESIUM: Magnesium: 2.1 mg/dL (ref 1.6–2.3)

## 2024-06-10 MED ORDER — POTASSIUM CHLORIDE ER 10 MEQ PO TBCR: 20.0000 meq | EXTENDED_RELEASE_TABLET | Freq: Every day | ORAL | 3 refills | Status: DC

## 2024-06-19 ENCOUNTER — Other Ambulatory Visit: Payer: Self-pay

## 2024-06-19 DIAGNOSIS — I2510 Atherosclerotic heart disease of native coronary artery without angina pectoris: Secondary | ICD-10-CM

## 2024-06-19 DIAGNOSIS — I10 Essential (primary) hypertension: Secondary | ICD-10-CM

## 2024-06-19 MED ORDER — BD SWAB SINGLE USE REGULAR PADS: 1.0000 [IU] | MEDICATED_PAD | Freq: Three times a day (TID) | 1 refills | Status: AC

## 2024-06-19 MED ORDER — CHLORTHALIDONE 12.5 MG PO TABS: ORAL_TABLET | ORAL | 1 refills | Status: DC

## 2024-06-19 MED ORDER — LOSARTAN POTASSIUM 100 MG PO TABS: 100.0000 mg | ORAL_TABLET | Freq: Every day | ORAL | 1 refills | Status: DC

## 2024-06-19 MED ORDER — LANCETS MISC. MISC: 1.0000 | Freq: Three times a day (TID) | 0 refills | Status: AC

## 2024-06-19 MED ORDER — BLOOD GLUCOSE MONITORING SUPPL DEVI
1.0000 | Freq: Three times a day (TID) | 0 refills | Status: AC
Start: 2024-06-19 — End: ?

## 2024-06-19 MED ORDER — BLOOD GLUCOSE TEST VI STRP: 1.0000 | ORAL_STRIP | Freq: Three times a day (TID) | 0 refills | Status: DC

## 2024-06-19 MED ORDER — LANCET DEVICE MISC: 1.0000 | Freq: Three times a day (TID) | 0 refills | Status: AC

## 2024-06-19 MED ORDER — ROSUVASTATIN CALCIUM 40 MG PO TABS: 40.0000 mg | ORAL_TABLET | Freq: Every day | ORAL | 1 refills | Status: DC

## 2024-06-19 MED ORDER — EZETIMIBE 10 MG PO TABS: 10.0000 mg | ORAL_TABLET | Freq: Every day | ORAL | 3 refills | Status: DC

## 2024-06-19 NOTE — Telephone Encounter (Signed)
 Called patient to verify which pharmacy she would like her refills to go to. Faxed received from [facility] pharmacy but also using [facility]. Left message to call our office back.

## 2024-06-19 NOTE — Telephone Encounter (Signed)
 Copied from CRM [id number]. Topic: General - Other >> Jun 19, 2024  4:08 PM Sophia H wrote: Reason for CRM: Patient wants rx's sent to [facility], states a fax was sent in requesting this.

## 2024-06-24 ENCOUNTER — Other Ambulatory Visit: Payer: Self-pay | Admitting: Student

## 2024-06-24 DIAGNOSIS — I10 Essential (primary) hypertension: Secondary | ICD-10-CM

## 2024-06-24 MED ORDER — CHLORTHALIDONE 25 MG PO TABS: 12.5000 mg | ORAL_TABLET | Freq: Every day | ORAL | 1 refills | Status: DC

## 2024-06-24 MED ORDER — CHLORTHALIDONE 12.5 MG PO TABS: 12.5000 mg | ORAL_TABLET | Freq: Every day | ORAL | 1 refills | Status: DC

## 2024-06-24 MED ORDER — LOSARTAN POTASSIUM 100 MG PO TABS: 100.0000 mg | ORAL_TABLET | Freq: Every day | ORAL | 1 refills | Status: DC

## 2024-06-24 MED ORDER — TRUE METRIX LEVEL 1 LOW VI SOLN: 1.0000 [IU] | Freq: Once | 0 refills | Status: AC

## 2024-06-24 NOTE — Telephone Encounter (Signed)
 Called [facility] pharmacy they need a new prescription for chlorthalidone , they do not carry 12.5 mg tablet. Rx needs to be 25 mg. Please review.

## 2024-06-24 NOTE — Addendum Note (Signed)
 Addended by: CAMACHO OCAMPO, Madisen Ludvigsen M on: 06/24/2024 04:12 PM   Modules accepted: Orders

## 2024-06-24 NOTE — Addendum Note (Signed)
 Addended by: CAMACHO OCAMPO, Jaymien Landin M on: 06/24/2024 03:18 PM   Modules accepted: Orders

## 2024-06-24 NOTE — Telephone Encounter (Signed)
 Copied from CRM #[id number]. Topic: Clinical - Prescription Issue >> Jun 24, 2024  2:28 PM Fonda T wrote: Reason for CRM: Received call from patient, states [facility] pharmacy has been attempting to contact office regarding complete current medication list that needs refilled.    Patient requesting a return follow up call once someone from office has spoken to [facility] for complete medication refills.  Patient can be reached at [phone].   [facility] - [place], [place] - [address] [place] [address] Phone: [phone] / [phone] Fax: [phone]

## 2024-06-24 NOTE — Telephone Encounter (Signed)
 Requested Prescriptions  Pending Prescriptions Disp Refills   chlorthalidone  (HYGROTON ) 25 MG tablet 90 tablet 1    Sig: Take 0.5 tablets (12.5 mg total) by mouth daily.     Cardiovascular: Diuretics - Thiazide Failed - 06/24/2024  5:02 PM      Failed - Na in normal range and within 180 days    Sodium  Date Value Ref Range Status  06/09/2024 145 (H) 134 - 144 mmol/L Final         Passed - Cr in normal range and within 180 days    Creatinine, Ser  Date Value Ref Range Status  06/09/2024 0.94 0.57 - 1.00 mg/dL Final         Passed - K in normal range and within 180 days    Potassium  Date Value Ref Range Status  06/09/2024 3.6 3.5 - 5.2 mmol/L Final         Passed - Last BP in normal range    BP Readings from Last 1 Encounters:  06/02/24 114/70         Passed - Valid encounter within last 6 months    Recent Outpatient Visits           3 weeks ago Prediabetes   [facility], [place], MD       Future Appointments             In 2 weeks Dunn, Bernardino HERO, PA-C [facility]             losartan  (COZAAR ) 100 MG tablet 90 tablet 1    Sig: Take 1 tablet (100 mg total) by mouth daily.     Cardiovascular:  Angiotensin Receptor Blockers Passed - 06/24/2024  5:02 PM      Passed - Cr in normal range and within 180 days    Creatinine, Ser  Date Value Ref Range Status  06/09/2024 0.94 0.57 - 1.00 mg/dL Final         Passed - K in normal range and within 180 days    Potassium  Date Value Ref Range Status  06/09/2024 3.6 3.5 - 5.2 mmol/L Final         Passed - Patient is not pregnant      Passed - Last BP in normal range    BP Readings from Last 1 Encounters:  06/02/24 114/70         Passed - Valid encounter within last 6 months    Recent Outpatient Visits           3 weeks ago Prediabetes   [place], MD       Future  Appointments             In 2 weeks Dunn, Bernardino HERO, PA-C [facility] at [facility]

## 2024-06-24 NOTE — Telephone Encounter (Signed)
 Copied from CRM [id number]. Topic: Clinical - Prescription Issue >> Jun 24, 2024  4:31 PM Kevelyn M wrote: Reason for CRM: Patient called and was frustrated because some of her prescriptions were [facility] and ALL prescriptions should be sent to [facility] because she has medicare. Currently she needs these two prescriptions.   chlorthalidone  (HYGROTON ) 25 MG tablet losartan  (COZAAR ) 100 MG tablet  [facility] - [place], [place] - [address] [place] [address] Phone: [phone]  Fax: [phone]

## 2024-06-29 ENCOUNTER — Encounter: Payer: Self-pay | Admitting: *Deleted

## 2024-06-29 ENCOUNTER — Telehealth: Payer: Self-pay

## 2024-06-29 NOTE — Telephone Encounter (Signed)
   Pre-operative Risk Assessment    Patient Name: Kelsey Terry  DOB: Sep 10, 1961 MRN: [id number]   Date of last office visit: 02/06/2024, Bernardino Bring, PA-C Date of next office visit: 08/04/2024, Bernardino Bring, PA-C   Request for Surgical Clearance    Procedure:  Colonoscopy  Date of Surgery:  Clearance 09/10/24                                Surgeon: Not indicated Surgeon's Group or Practice Name: [facility] Phone number: [phone] Fax number: [phone]   Type of Clearance Requested:   - Medical  - Pharmacy:  Hold Aspirin      Type of Anesthesia:  General    Additional requests/questions:    SignedAsberry KANDICE Dunning   06/29/2024, 11:02 AM

## 2024-06-30 NOTE — Telephone Encounter (Signed)
   Name: Kelsey Terry  DOB: 23-Mar-1961  MRN: [id number]  Primary Cardiologist: Deatrice Cage, MD  Chart reviewed as part of pre-operative protocol coverage. The patient has an upcoming visit scheduled with Bernardino Bring, PA on 08/04/24 at which time clearance can be addressed in case there are any issues that would impact surgical recommendations.  Colonoscopy is not scheduled until 09/10/24 as below. I added preop FYI to appointment note so that provider is aware to address at time of outpatient visit.  Per office protocol the cardiology provider should forward their finalized clearance decision and recommendations regarding antiplatelet therapy to the requesting party below.    I will route this message as FYI to requesting party and remove this message from the preop box as separate preop APP input not needed at this time.   Please call with any questions.  Aryaman Haliburton D Tavonte Seybold, NP  06/30/2024, 1:04 PM

## 2024-07-07 ENCOUNTER — Ambulatory Visit

## 2024-07-09 ENCOUNTER — Other Ambulatory Visit: Payer: Self-pay | Admitting: Student

## 2024-07-09 ENCOUNTER — Ambulatory Visit
Admission: RE | Admit: 2024-07-09 | Discharge: 2024-07-09 | Disposition: A | Discharge status: Home / Self Care | Source: Ambulatory Visit | Attending: Student | Admitting: Student

## 2024-07-09 ENCOUNTER — Telehealth: Payer: Self-pay | Admitting: Student

## 2024-07-09 ENCOUNTER — Ambulatory Visit: Admitting: Physician Assistant

## 2024-07-09 DIAGNOSIS — Z1231 Encounter for screening mammogram for malignant neoplasm of breast: Secondary | ICD-10-CM | POA: Insufficient documentation

## 2024-07-09 DIAGNOSIS — I10 Essential (primary) hypertension: Secondary | ICD-10-CM

## 2024-07-09 MED ORDER — POTASSIUM CHLORIDE ER 10 MEQ PO TBCR
20.0000 meq | EXTENDED_RELEASE_TABLET | Freq: Every day | ORAL | 1 refills | Status: DC
Start: 2024-07-09 — End: 2024-09-17

## 2024-07-09 NOTE — Telephone Encounter (Signed)
 Please review message

## 2024-07-09 NOTE — Telephone Encounter (Signed)
 Patient has been made aware.

## 2024-07-09 NOTE — Telephone Encounter (Signed)
 Patient came into office needing Potassium medication [facility]. Pt stated she received all other medications. Pt stated medications is for 90 days

## 2024-07-10 ENCOUNTER — Encounter
Admission: RE | Admit: 2024-07-10 | Discharge: 2024-07-10 | Disposition: A | Discharge status: Home / Self Care | Source: Ambulatory Visit | Attending: Physician Assistant | Admitting: Physician Assistant

## 2024-07-10 DIAGNOSIS — R072 Precordial pain: Secondary | ICD-10-CM | POA: Insufficient documentation

## 2024-07-10 LAB — NM MYOCAR MULTI W/SPECT W/WALL MOTION / EF
LV dias vol: 84 mL (ref 46–106)
LV sys vol: 27 mL (ref 3.8–5.2)
Nuc Stress EF: 68 %
Peak HR: 122 {beats}/min
Rest HR: 101 {beats}/min
Rest Nuclear Isotope Dose: 10.3 mCi
SDS: 0
SRS: 0
SSS: 1
Stress Nuclear Isotope Dose: 30.5 mCi
TID: 0.9

## 2024-07-10 MED ORDER — TECHNETIUM TC 99M TETROFOSMIN IV KIT
10.0000 | PACK | Freq: Once | INTRAVENOUS | Status: AC | PRN
  Administered 2024-07-10: 10.32 via INTRAVENOUS

## 2024-07-10 MED ORDER — REGADENOSON 0.4 MG/5ML IV SOLN
0.4000 mg | Freq: Once | INTRAVENOUS | Status: AC
  Administered 2024-07-10: 0.4 mg via INTRAVENOUS
  Filled 2024-07-10: qty 5

## 2024-07-10 MED ORDER — TECHNETIUM TC 99M TETROFOSMIN IV KIT
30.0000 | PACK | Freq: Once | INTRAVENOUS | Status: AC | PRN
  Administered 2024-07-10: 30.51 via INTRAVENOUS

## 2024-07-14 ENCOUNTER — Ambulatory Visit: Payer: Self-pay | Admitting: Physician Assistant

## 2024-07-14 NOTE — Telephone Encounter (Signed)
 Pt called and results reviewed - pt appreciative and thanks passed on to provider

## 2024-07-24 NOTE — Telephone Encounter (Signed)
 Requesting office sent duplicate. Pre op request is in process for our preop team currently.  Pt has appt 08/04/24 with Bernardino Bring, PAC. Procedure scheduled as 09/10/24. Once the pt has been cleared our practice will be sure to fax clearance notes and   any recommendations.

## 2024-07-25 ENCOUNTER — Other Ambulatory Visit: Payer: Self-pay | Admitting: Student

## 2024-07-27 ENCOUNTER — Other Ambulatory Visit: Payer: Self-pay | Admitting: Student

## 2024-07-27 NOTE — Telephone Encounter (Signed)
 Requested medications are due for refill today.  unsure  Requested medications are on the active medications list.  no  Last refill. never  Future visit scheduled.   yes  Notes to clinic.  Please review.    Requested Prescriptions  Pending Prescriptions Disp Refills   TRUEplus Lancets 33G MISC [Pharmacy Med Name: TRUEPLUS LANCETS 33G] 100 each     Sig: TEST BLOOD SUGAR IN THE MORNING, AT NOON, AND AT BEDTIME     Endocrinology: Diabetes - Testing Supplies Passed - 07/27/2024 11:27 AM      Passed - Valid encounter within last 12 months    Recent Outpatient Visits           1 month ago Prediabetes   [facility] at [facility], MD       Future Appointments             In 1 week Dunn, Bernardino HERO, PA-C [facility] at [facility]

## 2024-07-28 NOTE — Telephone Encounter (Signed)
 Requested Prescriptions  Pending Prescriptions Disp Refills   Glucose Blood (BLOOD GLUCOSE TEST STRIPS) STRP [Pharmacy Med Name: TRUE METRIX SELF MONITORI] 100 strip 0    Sig: TEST BLOOD SUGAR IN THE MORNING, AT NOON AND AT BEDTIME     Endocrinology: Diabetes - Testing Supplies Passed - 07/28/2024 11:05 AM      Passed - Valid encounter within last 12 months    Recent Outpatient Visits           1 month ago Prediabetes   [facility] at [facility], MD       Future Appointments             In 1 week Dunn, Bernardino HERO, PA-C [facility] at [facility]

## 2024-08-03 NOTE — Progress Notes (Unsigned)
 Cardiology Office Note    Date:  08/04/2024   ID:  Kelsey Terry, DOB August 28, 1961, MRN [id number]  PCP:  Lemon Raisin, MD  Cardiologist:  Deatrice Cage, MD  Electrophysiologist:  None   Chief Complaint: Follow up  History of Present Illness:   Kelsey Terry is a 63 y.o. female with history of CAD, PAD, pericarditis status post pericardiocentesis in 2012 in [place], [place] , HTN, HLD, and lymphedema who presents for follow-up of CAD.   She was previously followed by cardiology in [place]. She reported being admitted to [facility] in [place], [place] previously with substernal chest pain with workup revealing pericarditis. She underwent successful pericardiocentesis. She relocated to  to be closer to her daughter.  She was admitted with an NSTEMI in 04/2018.  LHC showed severe two-vessel CAD involving the proximal LAD and mid RCA.  She underwent successful PCI/DES to both vessels.  Echo at that time showed normal LV systolic function.  She has not required ischemic evaluation since.  She was noted to have peripheral arterial disease.  ABI was moderately reduced bilaterally with evidence of bilateral SFA occlusion.  She has been managed medically in the context of no claudication.  Outpatient cardiac monitoring in 2019 showed a predominant rhythm of sinus with 1 run of SVT lasting 30 seconds and a 5 beat run of NSVT with occasional PVCs.  She was last seen in the office in 01/2024 and reported an episode of sharp, substernal chest pain that occurred several days prior that was short-lived and followed by an episode of deeper substernal chest pain that radiated towards the left shoulder and persisted throughout the day.  Echo in 02/2024 showed an EF of 60 to 65%, no regional wall motion abnormalities, mild LVH, normal LV diastolic function parameters, normal RV systolic function and ventricular cavity size, mildly elevated RVSP estimated at 43.2 mmHg, mildly to moderately dilated left  atrium, mild to moderate mitral regurgitation, moderate tricuspid regurgitation, mild aortic insufficiency, and aortic valve sclerosis without evidence of stenosis.  Lexiscan  MPI in 06/2024 was overall low risk with an EF greater than 65%.  She is scheduled for colonoscopy on 09/10/2024.  She comes in doing well from a cardiac perspective and remains without symptoms of angina or cardiac decompensation.  No further chest pain.  No dyspnea, presyncope, or syncope.  No falls or symptoms concerning for bleeding.  She does report having some dizziness this morning while getting ready, though is currently asymptomatic.  Continues to wear lymphedema pumps.  Reports I never sit down.  She does not have any acute cardiac concerns at this time.   [facility] Activity Status Index: > 4 METs Revised Cardiac Risk Index: Low risk for low risk noncardiac procedure   Labs independently reviewed: 05/2024 - magnesium 2.1, TC 134, TG 67, HDL 56, LDL 64, BUN 14, serum creatinine 0.94, potassium 3.6, A1c 6.0 01/2024 - albumin 4.4, AST/ALT normal 04/2023 - TSH normal, Hgb 12.4, PLT 169  Past Medical History:  Diagnosis Date   Allergies    Asthma    Blood dyscrasia    blood clot in right leg   CAD (coronary artery disease) 05/05/2019   CAD in native artery    a. NSTEMI 05/16/18: LHC 05/16/18 - ost-pLAD 95% s/p PCI/DES, mLAD 30%, ostD1 70%, ost-pLCx 30%, mRCA 90%, dRCA 20%; b. staged PCI/DES to [facility] 7/1   Complication of anesthesia    patient stated she had some kind of reaction to anesthesia, thought she was having  a stroke during her second cath procedure.   Diabetes mellitus without complication (HCC)    pre-diabetes   Diastolic dysfunction    a. TTE 05/16/18: EF 55-60%, no RWMA, Gr1DD, trivial AI, calcified mitral annulus, mild MR, mildly dilated LA   Dyspnea    with activity   Hypertension    Lymphedema    LLE   NSTEMI (non-ST elevated myocardial infarction) (HCC) 05/16/2018   Pericarditis    a. ~ 2012 in  [place], [place] s/p pericardiocentesis    Wears contact lenses     Past Surgical History:  Procedure Laterality Date   ABDOMINAL HYSTERECTOMY     CESAREAN SECTION     COLONOSCOPY WITH PROPOFOL  N/A 08/13/2019   Procedure: COLONOSCOPY WITH BIOPSIES;  Surgeon: Jinny Carmine, MD;  Location: [facility];  Service: Endoscopy;  Laterality: N/A;   CORONARY STENT INTERVENTION N/A 05/16/2018   Procedure: CORONARY STENT INTERVENTION;  Surgeon: Darron Deatrice LABOR, MD;  Location: [facility] INVASIVE CV LAB;  Service: Cardiovascular;  Laterality: N/A;   CORONARY STENT INTERVENTION N/A 05/19/2018   Procedure: CORONARY STENT INTERVENTION;  Surgeon: Darron Deatrice LABOR, MD;  Location: [facility] INVASIVE CV LAB;  Service: Cardiovascular;  Laterality: N/A;   LEFT HEART CATH AND CORONARY ANGIOGRAPHY N/A 05/16/2018   Procedure: LEFT HEART CATH AND CORONARY ANGIOGRAPHY;  Surgeon: Darron Deatrice LABOR, MD;  Location: [facility] INVASIVE CV LAB;  Service: Cardiovascular;  Laterality: N/A;   PERICARDIOCENTESIS     POLYPECTOMY N/A 08/13/2019   Procedure: POLYPECTOMY;  Surgeon: Jinny Carmine, MD;  Location: [facility];  Service: Endoscopy;  Laterality: N/A;    Current Medications: Current Meds  Medication Sig   Alcohol Swabs (B-D SINGLE USE SWABS REGULAR) PADS 1 Units by Does not apply route 3 (three) times daily.   aspirin  EC (ASPIRIN  LOW DOSE) 81 MG tablet TAKE 1 TABLET DAILY   Blood Glucose Monitoring Suppl DEVI 1 each by Does not apply route in the morning, at noon, and at bedtime. May substitute to any manufacturer covered by patient's insurance.   CALCIUM  CITRATE-VITAMIN D PO Take 1 tablet by mouth 2 (two) times daily.   chlorthalidone  (HYGROTON ) 25 MG tablet Take 0.5 tablets (12.5 mg total) by mouth daily.   ezetimibe  (ZETIA ) 10 MG tablet Take 1 tablet (10 mg total) by mouth daily.   Glucose Blood (BLOOD GLUCOSE TEST STRIPS) STRP TEST BLOOD SUGAR IN THE MORNING, AT NOON AND AT BEDTIME   losartan  (COZAAR ) 100 MG tablet  Take 1 tablet (100 mg total) by mouth daily.   LUMIGAN 0.01 % SOLN 1 drop at bedtime.   metFORMIN  (GLUCOPHAGE ) 500 MG tablet Take 1 tablet (500 mg total) by mouth daily.   Multiple Vitamin (MULTIVITAMIN) tablet Take 1 tablet by mouth daily.   potassium chloride  (KLOR-CON ) 10 MEQ tablet Take 2 tablets (20 mEq total) by mouth daily.   rosuvastatin  (CRESTOR ) 40 MG tablet Take 1 tablet (40 mg total) by mouth daily.   TRUEplus Lancets 33G MISC TEST BLOOD SUGAR IN THE MORNING, AT NOON, AND AT BEDTIME    Allergies:   Atrovent  nasal spray [ipratropium], Dorzolamide, Latanoprost, Timolol, Amoxicillin, and Erythromycin   Social History   Socioeconomic History   Marital status: Widowed    Spouse name: Not on file   Number of children: Not on file   Years of education: Not on file   Highest education level: Not on file  Occupational History   Not on file  Tobacco Use   Smoking status: Never  Smokeless tobacco: Never  Vaping Use   Vaping status: Never Used  Substance and Sexual Activity   Alcohol use: Never   Drug use: Never   Sexual activity: Not Currently  Other Topics Concern   Not on file  Social History Narrative   Not on file   Social Drivers of Health   Financial Resource Strain: High Risk (06/16/2019)   Overall Financial Resource Strain (CARDIA)    Difficulty of Paying Living Expenses: Very hard  Food Insecurity: Food Insecurity Present (06/02/2024)   Hunger Vital Sign    Worried About Running Out of Food in the Last Year: Sometimes true    Ran Out of Food in the Last Year: Sometimes true  Transportation Needs: No Transportation Needs (06/02/2024)   PRAPARE - Administrator, Civil Service (Medical): No    Lack of Transportation (Non-Medical): No  Physical Activity: Unknown (06/16/2019)   Exercise Vital Sign    Days of Exercise per Week: 3 days    Minutes of Exercise per Session: Not on file  Stress: No Stress Concern Present (06/16/2019)   [employer] of  Occupational Health - Occupational Stress Questionnaire    Feeling of Stress : Only a little  Social Connections: Moderately Integrated (06/16/2019)   Social Connection and Isolation Panel    Frequency of Communication with Friends and Family: More than three times a week    Frequency of Social Gatherings with Friends and Family: More than three times a week    Attends Religious Services: More than 4 times per year    Active Member of [employer] or Organizations: Yes    Attends Banker Meetings: Never    Marital Status: Widowed     Family History:  The patient's family history includes Diabetes in her brother; Hypertension in her mother; Pancreatic cancer in her father. There is no history of Breast cancer.  ROS:   12-point review of systems is negative unless otherwise noted in the HPI.   EKGs/Labs/Other Studies Reviewed:    Studies reviewed were summarized above. The additional studies were reviewed today:  Lexiscan  MPI 07/10/2024:   Low risk, probably normal pharmacologic myocardial perfusion stress test.   There is a small in size, mild in severity, reversible defect involving the apical and anterior lateral segments with normal wall motion most consistent with shifting breast attenuation artifact.  Small area of ischemia is felt less likely.   No significant scar is identified.   Left ventricular systolic function is normal (>65%) with normal wall motion. __________  2D echo 03/03/2024: 1. Left ventricular ejection fraction, by estimation, is 60 to 65%. The  left ventricle has normal function. The left ventricle has no regional  wall motion abnormalities. There is mild left ventricular hypertrophy.  Left ventricular diastolic parameters  were normal.   2. Right ventricular systolic function is normal. The right ventricular  size is normal. There is mildly elevated pulmonary artery systolic  pressure. The estimated right ventricular systolic pressure is 43.2 mmHg.   3.  Left atrial size was mild to moderately dilated.   4. The mitral valve is normal in structure. Mild to moderate mitral valve  regurgitation. No evidence of mitral stenosis.   5. Tricuspid valve regurgitation is moderate.   6. The aortic valve is tricuspid. Aortic valve regurgitation is mild.  Aortic valve sclerosis is present, with no evidence of aortic valve  stenosis.   7. The inferior vena cava is normal in size with greater than 50%  respiratory variability, suggesting right atrial pressure of 3 mmHg.  __________  Outpatient cardiac monitor 06/2018: Normal sinus rhythm with no evidence of atrial fibrillation. 1 run of SVT lasting 30 seconds. 5 beat run of ventricular tachycardia. Occasional PVCs. __________   ABIs 08/05/2018: ABI/TBIToday's ABIToday's TBIPrevious ABIPrevious TBI  +-------+-----------+-----------+------------+------------+  Right  0.69       0.37                                 +-------+-----------+-----------+------------+------------+  Left   0.71       0.35                                 +-------+-----------+-----------+------------+------------+   Waveforms suggestive of bilateral inflow disease. See arterial duplex and  aorto-iliac images     Final Interpretation:  Right: Resting right ankle-brachial index indicates moderate right lower  extremity arterial disease. The right toe-brachial index is abnormal.   Left: Resting left ankle-brachial index indicates moderate left lower  extremity arterial disease. The left toe-brachial index is abnormal.  __________   Lower extremity arterial ultrasound 08/05/2018: Final Interpretation:  Right: Total occlusion noted in the superficial femoral artery. Total  occlusion noted in the superficial femoral artery and/or popliteal artery.  Total occlusion noted in the peroneal artery.   Left: Total occlusion noted in the superficial femoral artery and/or  popliteal artery. Total occlusion noted in  the peroneal artery.   Aorto-iliac images were somewhat limited. The right CIA was not seen at  all due to overlying bowel gas.  __________   [facility] 05/19/2018: Previously placed Ost LAD to Prox LAD drug-eluting stents are widely patent. Balloon angioplasty was performed. Mid LAD lesion is 30% stenosed. Ost 1st Diag lesion is 70% stenosed. Ost Cx to Prox Cx lesion is 30% stenosed. Dist RCA lesion is 30% stenosed. Mid RCA lesion is 90% stenosed. A drug-eluting stent was successfully placed using a STENT SIERRA 3.25 X 18 MM. Post intervention, there is a 0% residual stenosis.   1. Successful angioplasty and drug-eluting stent placement to the mid right coronary artery. 2.  Vagal reaction during closure device deployment responded well to atropine , IV fluids and Zofran .     Recommendations: Continue dual antiplatelet therapy for at least one year.  Aggressive treatment of risk factors. I started the patient on rosuvastatin  given the possible side effect of atorvastatin . Right femoral artery angiography showed evidence of occluded SFA which seems to be chronic.  I will evaluate the patient for PAD as an outpatient. __________   Carotid artery ultrasound 05/17/2018: IMPRESSION: 1. Mild bilateral carotid bifurcation plaque resulting in less than 50% diameter stenosis. 2.  Antegrade bilateral vertebral arterial flow. __________   2D echo 05/16/2018: - Left ventricle: The cavity size was normal. There was mild    concentric hypertrophy. Systolic function was normal. The    estimated ejection fraction was in the range of 55% to 60%. Wall    motion was normal; there were no regional wall motion    abnormalities. Doppler parameters are consistent with abnormal    left ventricular relaxation (grade 1 diastolic dysfunction).  - Aortic valve: There was trivial regurgitation.  - Mitral valve: Calcified annulus. There was mild regurgitation.  - Left atrium: The atrium was mildly  dilated. __________   LHC 05/16/2018: Mid RCA lesion is 90% stenosed. Dist RCA lesion is  20% stenosed. Ost Cx to Prox Cx lesion is 30% stenosed. Ost LAD to Prox LAD lesion is 95% stenosed. Post intervention, there is a 0% residual stenosis. A drug-eluting stent was successfully placed using a STENT SIERRA 3.25 X 15 MM. Mid LAD lesion is 30% stenosed. Ost 1st Diag lesion is 70% stenosed. A stent was successfully placed.   1. Severe two-vessel coronary artery disease.  The culprit is 95% proximal LAD stenosis.  There is also 90% mid RCA stenosis. 2.  Left ventricular angiography could not be performed due to difficulty advancing the pigtail catheter as a result of severe radial artery spasm. Obtain an echocardiogram.  3.  Successful angioplasty and drug-eluting stent placement to the proximal LAD.   Recommendations: Dual antiplatelet therapy for at least one year. Blood pressure control and aggressive treatment of risk factors. Staged RCA PCI on Monday.  I might consider the femoral artery approach given radial artery spasm.   EKG:  EKG is ordered today.  The EKG ordered today demonstrates sinus bradycardia, 48 bpm, no acute ST-T changes  Recent Labs: 02/06/2024: ALT 17 06/09/2024: BUN 14; Creatinine, Ser 0.94; Magnesium 2.1; Potassium 3.6; Sodium 145  Recent Lipid Panel    Component Value Date/Time   CHOL 134 06/09/2024 1121   TRIG 67 06/09/2024 1121   HDL 56 06/09/2024 1121   CHOLHDL 2.4 06/09/2024 1121   CHOLHDL 4.2 05/16/2018 0009   VLDL 9 05/16/2018 0009   LDLCALC 64 06/09/2024 1121   LDLDIRECT 90 02/06/2024 1122    PHYSICAL EXAM:    VS:  BP (!) 140/70 (BP Location: Left Arm, Patient Position: Sitting, Cuff Size: Normal)   Pulse (!) 48   Ht 5' 3 (1.6 m)   Wt 181 lb 3.2 oz (82.2 kg)   SpO2 95%   BMI 32.10 kg/m   BMI: Body mass index is 32.1 kg/m.  Physical Exam Constitutional:      Appearance: She is well-developed.  HENT:     Head: Normocephalic and  atraumatic.  Eyes:     General:        Right eye: No discharge.        Left eye: No discharge.  Cardiovascular:     Rate and Rhythm: Regular rhythm. Bradycardia present.     Heart sounds: Normal heart sounds, S1 normal and S2 normal. Heart sounds not distant. No midsystolic click and no opening snap. No murmur heard.    No friction rub.  Pulmonary:     Effort: Pulmonary effort is normal. No respiratory distress.     Breath sounds: Normal breath sounds. No decreased breath sounds, wheezing, rhonchi or rales.  Musculoskeletal:     Cervical back: Normal range of motion.     Right lower leg: Edema present.     Left lower leg: Edema present.  Skin:    General: Skin is warm and dry.     Nails: There is no clubbing.  Neurological:     Mental Status: She is alert and oriented to person, place, and time.  Psychiatric:        Speech: Speech normal.        Behavior: Behavior normal.        Thought Content: Thought content normal.        Judgment: Judgment normal.     Wt Readings from Last 3 Encounters:  08/04/24 181 lb 3.2 oz (82.2 kg)  06/02/24 175 lb 4 oz (79.5 kg)  05/09/24 176 lb 12.9 oz (80.2 kg)  ASSESSMENT & PLAN:   CAD involving the native coronary arteries without angina: She is doing well and without symptoms concerning for angina or cardiac decompensation.  Recent Lexiscan  MPI without evidence of infarction or ischemia and overall low risk.  Continue aggressive risk factor modification and secondary prevention including aspirin  81 mg, ezetimibe  10 mg, and rosuvastatin  40 mg.  No indication for further ischemic testing at this time.  HFpEF: Euvolemic and well compensated.  Remains on chlorthalidone  12.5 mg.  Continue optimal blood pressure control.  Mitral and tricuspid regurgitation/aortic insufficiency: Recent echo showed mild to moderate mitral regurgitation, moderate tricuspid regurgitation, and mild aortic insufficiency.  Monitor periodically.  Sinus bradycardia:  Asymptomatic at this time.  Heart rate at rest and at 51 bpm trending to 52 bpm with ambulation around the office (1 lap).  Not on any AV nodal blocking medications.  Place a ZIO XT for 7 days to evaluate chronotropic competence.  Check TSH, magnesium, BMP, and CBC.  No emergent indication for PPM.  HTN: Blood pressure is mildly elevated in the office today.  Possibly in the setting of his sinus bradycardia.  Remains on chlorthalidone  12.5 mg and losartan  100 mg.  HLD: LDL 64 in 05/2024.  Remains on rosuvastatin  40 mg and ezetimibe  10 mg.  Lymphedema: Continues with lymphedema pumps.  PAD: No symptoms concerning for claudication.  Remains on aspirin  81 mg and rosuvastatin  40 mg as outlined above.  Preprocedure cardiac risk stratification: As she is scheduled to undergo colonoscopy on 09/10/2024.  Recent echo demonstrated preserved LV systolic function and Lexiscan  MPI without evidence of ischemia or scar.  Per [facility] Activity Status Index she can achieve > 4 METs.  Per Revised Cardiac Risk Index she is low risk for low risk noncardiac procedure.  She may proceed with noncardiac procedure without further cardiac testing.    Disposition: F/u with Dr. Darron or an APP in 2 months.   Medication Adjustments/Labs and Tests Ordered: Current medicines are reviewed at length with the patient today.  Concerns regarding medicines are outlined above. Medication changes, Labs and Tests ordered today are summarized above and listed in the Patient Instructions accessible in Encounters.   Signed, Bernardino Bring, PA-C 08/04/2024 10:02 AM     [facility] [place] [address] [place], [place] [address] [phone]

## 2024-08-04 ENCOUNTER — Ambulatory Visit: Attending: Physician Assistant | Admitting: Physician Assistant

## 2024-08-04 ENCOUNTER — Ambulatory Visit

## 2024-08-04 ENCOUNTER — Encounter: Payer: Self-pay | Admitting: Physician Assistant

## 2024-08-04 VITALS — BP 140/70 | HR 48 | Ht 63.0 in | Wt 181.2 lb

## 2024-08-04 DIAGNOSIS — Z79899 Other long term (current) drug therapy: Secondary | ICD-10-CM

## 2024-08-04 DIAGNOSIS — I739 Peripheral vascular disease, unspecified: Secondary | ICD-10-CM

## 2024-08-04 DIAGNOSIS — I340 Nonrheumatic mitral (valve) insufficiency: Secondary | ICD-10-CM | POA: Diagnosis not present

## 2024-08-04 DIAGNOSIS — R001 Bradycardia, unspecified: Secondary | ICD-10-CM

## 2024-08-04 DIAGNOSIS — I071 Rheumatic tricuspid insufficiency: Secondary | ICD-10-CM | POA: Diagnosis not present

## 2024-08-04 DIAGNOSIS — I351 Nonrheumatic aortic (valve) insufficiency: Secondary | ICD-10-CM

## 2024-08-04 DIAGNOSIS — I25118 Atherosclerotic heart disease of native coronary artery with other forms of angina pectoris: Secondary | ICD-10-CM

## 2024-08-04 DIAGNOSIS — I5032 Chronic diastolic (congestive) heart failure: Secondary | ICD-10-CM | POA: Diagnosis not present

## 2024-08-04 DIAGNOSIS — I10 Essential (primary) hypertension: Secondary | ICD-10-CM

## 2024-08-04 DIAGNOSIS — I890 Lymphedema, not elsewhere classified: Secondary | ICD-10-CM

## 2024-08-04 DIAGNOSIS — Z01810 Encounter for preprocedural cardiovascular examination: Secondary | ICD-10-CM

## 2024-08-04 DIAGNOSIS — E785 Hyperlipidemia, unspecified: Secondary | ICD-10-CM

## 2024-08-04 NOTE — Patient Instructions (Addendum)
 Medication Instructions:  Your physician recommends that you continue on your current medications as directed. Please refer to the Current Medication list given to you today.   *If you need a refill on your cardiac medications before your next appointment, please call your pharmacy*  Lab Work: Your provider would like for you to have following labs drawn today CBC, BMP, TSH, and Mag level.   If you have labs (blood work) drawn today and your tests are completely normal, you will receive your results only by: MyChart Message (if you have MyChart) OR A paper copy in the mail If you have any lab test that is abnormal or we need to change your treatment, we will call you to review the results.  Testing/Procedures: Kelsey Terry- Long Term Monitor Instructions  Your physician has requested you wear a ZIO patch monitor for 7 days days.  This is a single patch monitor. [employer] supplies one patch monitor per enrollment. Additional stickers are not available. Please do not apply patch if you will be having a Nuclear Stress Test, Echocardiogram, Cardiac CT, MRI, or Chest Xray during the period you would be wearing the monitor. The patch cannot be worn during these tests. You cannot remove and re-apply the ZIO XT patch monitor.  Your ZIO patch monitor will be mailed 3 day USPS to your address on file. It may take 3-5 days to receive your monitor after you have been enrolled. Once you have received your monitor, please review the enclosed instructions. Your monitor has already been registered assigning a specific monitor serial number to you.  Billing and Patient Assistance Program Information  We have supplied [employer] with any of your insurance information on file for billing purposes.  [employer] offers a sliding scale Patient Assistance Program for patients that do not have insurance, or whose insurance does not completely cover the cost of the ZIO monitor.  You must apply for the Patient Assistance Program to  qualify for this discounted rate.  To apply, please call [employer] at [phone], select option 4, select option 2, ask to apply for Patient Assistance Program. Meredeth will ask your household income, and how many people are in your household. They will quote your out-of-pocket cost based on that information. [employer] will also be able to set up a 72-month, interest-free payment plan if needed.  Applying the monitor   Shave hair from upper left chest.  Hold abrader disc by orange tab. Rub abrader in 40 strokes over the upper left chest as indicated in your monitor instructions.  Clean area with 4 enclosed alcohol pads. Let dry.  Apply patch as indicated in monitor instructions. Patch will be placed under collarbone on left side of chest with arrow pointing upward.  Rub patch adhesive wings for 2 minutes. Remove white label marked 1. Remove the white label marked 2. Rub patch adhesive wings for 2 additional minutes.  While looking in a mirror, press and release button in center of patch. A small green light will flash 3-4 times. This will be your only indicator that the monitor has been turned on.  Do not shower for the first 24 hours. You may shower after the first 24 hours.  Press the button if you feel a symptom. You will hear a small click. Record Date, Time and Symptom in the Patient Logbook.  When you are ready to remove the patch, follow instructions on the last 2 pages of Patient Logbook.  Stick patch monitor into the tabs at the bottom of  the return box.  Place Patient Logbook in the blue and white box. Use locking tab on box and tape box closed securely. The blue and white box has prepaid postage on it. Please place it in the mailbox as soon as possible. Your physician should have your test results approximately 7-14 days after the monitor has been mailed back to [facility].  Call [facility] Customer Care at [phone] if you have questions regarding your ZIO XT patch  monitor.  Call them immediately if you see an orange light blinking on your monitor.  If your monitor falls off in less than 4 days, contact our Monitor department at [phone].  If your monitor becomes loose or falls off after 4 days call [employer] at [phone] for suggestions on securing your monitor.   Follow-Up: At [facility], you and your health needs are our priority.  As part of our continuing mission to provide you with exceptional heart care, our providers are all part of one team.  This team includes your primary Cardiologist (physician) and Advanced Practice Providers or APPs (Physician Assistants and Nurse Practitioners) who all work together to provide you with the care you need, when you need it.  Your next appointment:   2-3 month(s)  Provider:   You may see Deatrice Cage, MD or Bernardino Bring, PA-C  We recommend signing up for the patient portal called MyChart.  Sign up information is provided on this After Visit Summary.  MyChart is used to connect with patients for Virtual Visits (Telemedicine).  Patients are able to view lab/test results, encounter notes, upcoming appointments, etc.  Non-urgent messages can be sent to your provider as well.   To learn more about what you can do with MyChart, go to [email or web address].

## 2024-08-05 ENCOUNTER — Telehealth: Payer: Self-pay | Admitting: *Deleted

## 2024-08-05 LAB — CBC
Hematocrit: 40.8 % (ref 34.0–46.6)
Hemoglobin: 12.9 g/dL (ref 11.1–15.9)
MCH: 27.5 pg (ref 26.6–33.0)
MCHC: 31.6 g/dL (ref 31.5–35.7)
MCV: 87 fL (ref 79–97)
Platelets: 215 x10E3/uL (ref 150–450)
RBC: 4.69 x10E6/uL (ref 3.77–5.28)
RDW: 13.1 % (ref 11.7–15.4)
WBC: 3.6 x10E3/uL (ref 3.4–10.8)

## 2024-08-05 LAB — BASIC METABOLIC PANEL WITH GFR
BUN/Creatinine Ratio: 15 (ref 12–28)
BUN: 14 mg/dL (ref 8–27)
CO2: 26 mmol/L (ref 20–29)
Calcium: 9.9 mg/dL (ref 8.7–10.3)
Chloride: 102 mmol/L (ref 96–106)
Creatinine, Ser: 0.91 mg/dL (ref 0.57–1.00)
Glucose: 82 mg/dL (ref 70–99)
Potassium: 3.7 mmol/L (ref 3.5–5.2)
Sodium: 141 mmol/L (ref 134–144)
eGFR: 70 mL/min/1.73 (ref >59)

## 2024-08-05 LAB — MAGNESIUM: Magnesium: 2.2 mg/dL (ref 1.6–2.3)

## 2024-08-05 LAB — TSH: TSH: 1.43 u[IU]/mL (ref 0.450–4.500)

## 2024-08-05 NOTE — Telephone Encounter (Signed)
 Preprocedure cardiac risk stratification: As she is scheduled to undergo colonoscopy on 09/10/2024.  Recent echo demonstrated preserved LV systolic function and Lexiscan  MPI without evidence of ischemia or scar.  Per [facility] Activity Status Index she can achieve > 4 METs.  Per Revised Cardiac Risk Index she is low risk for low risk noncardiac procedure.  She may proceed with noncardiac procedure without further cardiac testing.  Signed, Bernardino Bring, PA-C 08/04/2024 10:02 AM     [facility] [place] [address] [place], [place] [address] [phone]

## 2024-08-06 ENCOUNTER — Ambulatory Visit: Payer: Self-pay | Admitting: Physician Assistant

## 2024-09-01 ENCOUNTER — Other Ambulatory Visit: Payer: Self-pay | Admitting: *Deleted

## 2024-09-01 MED ORDER — NA SULFATE-K SULFATE-MG SULF 17.5-3.13-1.6 GM/177ML PO SOLN: 1.0000 | Freq: Once | ORAL | 0 refills | Status: AC

## 2024-09-04 ENCOUNTER — Telehealth: Payer: Self-pay

## 2024-09-04 MED ORDER — NA SULFATE-K SULFATE-MG SULF 17.5-3.13-1.6 GM/177ML PO SOLN: 1.0000 | Freq: Once | ORAL | 0 refills | Status: AC

## 2024-09-04 NOTE — Telephone Encounter (Signed)
 Pt called back to the office requested prep to be sent to local pharmacy.  [facility] [place] [facility].  Thanks,  [place], CMA

## 2024-09-10 ENCOUNTER — Ambulatory Visit: Admission: RE | Admit: 2024-09-10 | Source: Home / Self Care | Admitting: Gastroenterology

## 2024-09-10 SURGERY — COLONOSCOPY
Anesthesia: General

## 2024-09-16 ENCOUNTER — Other Ambulatory Visit: Payer: Self-pay | Admitting: Student

## 2024-09-16 DIAGNOSIS — I10 Essential (primary) hypertension: Secondary | ICD-10-CM

## 2024-09-16 DIAGNOSIS — I2510 Atherosclerotic heart disease of native coronary artery without angina pectoris: Secondary | ICD-10-CM

## 2024-09-16 NOTE — Telephone Encounter (Signed)
 Copied from CRM [id number]. Topic: Clinical - Medication Question >> Sep 16, 2024 11:25 AM Myrick T wrote: Reason for CRM: Laterriera A from [facility] called [facility]  Medication: chlorthalidone  (HYGROTON ) 25 MG tablet ezetimibe  (ZETIA ) 10 MG tablet  losartan  (COZAAR ) 100 MG tablet  potassium chloride  (KLOR-CON ) 10 MEQ tablet rosuvastatin  (CRESTOR ) 40 MG tablet  [facility] [place], [place] - [facility] AT Portal to [facility]  Phone: [phone] Fax: [phone]

## 2024-09-16 NOTE — Telephone Encounter (Signed)
 Called patient to verify if she requested the transfer and to give the okay. There was no answer, left voicemail message to have her call the office back.

## 2024-09-17 NOTE — Telephone Encounter (Signed)
 Called patient to verify if she requested the transfer and to give the okay. There was no answer, left voicemail message to have her call the office back.

## 2024-09-17 NOTE — Telephone Encounter (Signed)
 Copied from CRM [id number]. Topic: General - Other >> Sep 17, 2024  2:19 PM Kelsey Terry wrote: Pt returning Dr. Lemon 's call to verify that are aware that [facility] was requesting her refills. Pt is aware and approves of it.

## 2024-09-18 MED ORDER — ROSUVASTATIN CALCIUM 40 MG PO TABS: 40.0000 mg | ORAL_TABLET | Freq: Every day | ORAL | 1 refills | Status: DC

## 2024-09-18 MED ORDER — EZETIMIBE 10 MG PO TABS: 10.0000 mg | ORAL_TABLET | Freq: Every day | ORAL | 3 refills | Status: DC

## 2024-09-18 MED ORDER — LOSARTAN POTASSIUM 100 MG PO TABS: 100.0000 mg | ORAL_TABLET | Freq: Every day | ORAL | 1 refills | Status: DC

## 2024-09-18 MED ORDER — CHLORTHALIDONE 25 MG PO TABS: 12.5000 mg | ORAL_TABLET | Freq: Every day | ORAL | 1 refills | Status: DC

## 2024-09-18 MED ORDER — POTASSIUM CHLORIDE ER 10 MEQ PO TBCR: 20.0000 meq | EXTENDED_RELEASE_TABLET | Freq: Every day | ORAL | 1 refills | Status: DC

## 2024-10-29 NOTE — Progress Notes (Deleted)
 Cardiology Office Note    Date:  10/29/2024   ID:  Kelsey Terry, DOB 06/19/1961, MRN [id number]  PCP:  Lemon Raisin, MD  Cardiologist:  Deatrice Cage, MD  Electrophysiologist:  None   Chief Complaint: Follow-up  History of Present Illness:   Kelsey Terry is a 63 y.o. female with history of CAD, PAD, pericarditis status post pericardiocentesis in 2012 in [place], [place] , HTN, HLD, and lymphedema who presents for ***  ***   Labs independently reviewed: 07/2024 - magnesium 2.2, TSH normal, BUN 14, serum creatinine 0.91, potassium 3.7, Hgb 12.9, PLT 215 05/2024 - TC 134, TG 67, HDL 56, LDL 64, A1c 6.0 01/2024 - albumin 4.4, AST/ALT normal 04/2023 - TSH normal, Hgb 12.4, PLT 169  Past Medical History:  Diagnosis Date   Allergies    Asthma    Blood dyscrasia    blood clot in right leg   CAD (coronary artery disease) 05/05/2019   CAD in native artery    a. NSTEMI 05/16/18: LHC 05/16/18 - ost-pLAD 95% s/p PCI/DES, mLAD 30%, ostD1 70%, ost-pLCx 30%, mRCA 90%, dRCA 20%; b. staged PCI/DES to [facility] 7/1   Complication of anesthesia    patient stated she had some kind of reaction to anesthesia, thought she was having a stroke during her second cath procedure.   Diabetes mellitus without complication (HCC)    pre-diabetes   Diastolic dysfunction    a. TTE 05/16/18: EF 55-60%, no RWMA, Gr1DD, trivial AI, calcified mitral annulus, mild MR, mildly dilated LA   Dyspnea    with activity   Hypertension    Lymphedema    LLE   NSTEMI (non-ST elevated myocardial infarction) (HCC) 05/16/2018   Pericarditis    a. ~ 2012 in [place], [place] s/p pericardiocentesis    Wears contact lenses     Past Surgical History:  Procedure Laterality Date   ABDOMINAL HYSTERECTOMY     CESAREAN SECTION     COLONOSCOPY WITH PROPOFOL  N/A 08/13/2019   Procedure: COLONOSCOPY WITH BIOPSIES;  Surgeon: Jinny Carmine, MD;  Location: [facility];  Service: Endoscopy;  Laterality: N/A;   CORONARY STENT  INTERVENTION N/A 05/16/2018   Procedure: CORONARY STENT INTERVENTION;  Surgeon: Cage Deatrice LABOR, MD;  Location: [facility] CV [facility];  Service: Cardiovascular;  Laterality: N/A;   CORONARY STENT INTERVENTION N/A 05/19/2018   Procedure: CORONARY STENT INTERVENTION;  Surgeon: Cage Deatrice LABOR, MD;  Location: [facility] INVASIVE CV LAB;  Service: Cardiovascular;  Laterality: N/A;   LEFT HEART CATH AND CORONARY ANGIOGRAPHY N/A 05/16/2018   Procedure: LEFT HEART CATH AND CORONARY ANGIOGRAPHY;  Surgeon: Cage Deatrice LABOR, MD;  Location: [facility] INVASIVE CV LAB;  Service: Cardiovascular;  Laterality: N/A;   PERICARDIOCENTESIS     POLYPECTOMY N/A 08/13/2019   Procedure: POLYPECTOMY;  Surgeon: Jinny Carmine, MD;  Location: [facility];  Service: Endoscopy;  Laterality: N/A;    Current Medications: Active Medications[1]  Allergies:   Atrovent  nasal spray [ipratropium], Dorzolamide, Latanoprost, Timolol, Amoxicillin, and Erythromycin   Social History   Socioeconomic History   Marital status: Widowed    Spouse name: Not on file   Number of children: Not on file   Years of education: Not on file   Highest education level: Not on file  Occupational History   Not on file  Tobacco Use   Smoking status: Never   Smokeless tobacco: Never  Vaping Use   Vaping status: Never Used  Substance and Sexual Activity   Alcohol use: Never  Drug use: Never   Sexual activity: Not Currently  Other Topics Concern   Not on file  Social History Narrative   Not on file   Social Drivers of Health   Tobacco Use: Low Risk (08/04/2024)   Patient History    Smoking Tobacco Use: Never    Smokeless Tobacco Use: Never    Passive Exposure: Not on file  Financial Resource Strain: Not on file  Food Insecurity: Food Insecurity Present (06/02/2024)   Epic    Worried About Programme Researcher, Broadcasting/film/video in the Last Year: Sometimes true    [employer] of Food in the Last Year: Sometimes true  Transportation Needs: No Transportation  Needs (06/02/2024)   Epic    Lack of Transportation (Medical): No    Lack of Transportation (Non-Medical): No  Physical Activity: Not on file  Stress: Not on file  Social Connections: Not on file  Depression (PHQ2-9): Low Risk (06/02/2024)   Depression (PHQ2-9)    PHQ-2 Score: 0  Alcohol Screen: Not on file  Housing: Unknown (06/02/2024)   Epic    Unable to Pay for Housing in the Last Year: No    Number of Times Moved in the Last Year: Not on file    Homeless in the Last Year: No  Utilities: Not At Risk (06/02/2024)   Epic    Threatened with loss of utilities: No  Health Literacy: Not on file     Family History:  The patient's family history includes Diabetes in her brother; Hypertension in her mother; Pancreatic cancer in her father. There is no history of Breast cancer.  ROS:   12-point review of systems is negative unless otherwise noted in the HPI.   EKGs/Labs/Other Studies Reviewed:    Studies reviewed were summarized above. The additional studies were reviewed today:  Lexiscan  MPI 07/10/2024:   Low risk, probably normal pharmacologic myocardial perfusion stress test.   There is a small in size, mild in severity, reversible defect involving the apical and anterior lateral segments with normal wall motion most consistent with shifting breast attenuation artifact.  Small area of ischemia is felt less likely.   No significant scar is identified.   Left ventricular systolic function is normal (>65%) with normal wall motion. __________   2D echo 03/03/2024: 1. Left ventricular ejection fraction, by estimation, is 60 to 65%. The  left ventricle has normal function. The left ventricle has no regional  wall motion abnormalities. There is mild left ventricular hypertrophy.  Left ventricular diastolic parameters  were normal.   2. Right ventricular systolic function is normal. The right ventricular  size is normal. There is mildly elevated pulmonary artery systolic  pressure. The  estimated right ventricular systolic pressure is 43.2 mmHg.   3. Left atrial size was mild to moderately dilated.   4. The mitral valve is normal in structure. Mild to moderate mitral valve  regurgitation. No evidence of mitral stenosis.   5. Tricuspid valve regurgitation is moderate.   6. The aortic valve is tricuspid. Aortic valve regurgitation is mild.  Aortic valve sclerosis is present, with no evidence of aortic valve  stenosis.   7. The inferior vena cava is normal in size with greater than 50%  respiratory variability, suggesting right atrial pressure of 3 mmHg.  __________   Outpatient cardiac monitor 06/2018: Normal sinus rhythm with no evidence of atrial fibrillation. 1 run of SVT lasting 30 seconds. 5 beat run of ventricular tachycardia. Occasional PVCs. __________   ABIs 08/05/2018: ABI/TBIToday's  ABIToday's TBIPrevious ABIPrevious TBI  +-------+-----------+-----------+------------+------------+  Right  0.69       0.37                                 +-------+-----------+-----------+------------+------------+  Left   0.71       0.35                                 +-------+-----------+-----------+------------+------------+   Waveforms suggestive of bilateral inflow disease. See arterial duplex and  aorto-iliac images     Final Interpretation:  Right: Resting right ankle-brachial index indicates moderate right lower  extremity arterial disease. The right toe-brachial index is abnormal.   Left: Resting left ankle-brachial index indicates moderate left lower  extremity arterial disease. The left toe-brachial index is abnormal.  __________   Lower extremity arterial ultrasound 08/05/2018: Final Interpretation:  Right: Total occlusion noted in the superficial femoral artery. Total  occlusion noted in the superficial femoral artery and/or popliteal artery.  Total occlusion noted in the peroneal artery.   Left: Total occlusion noted in the superficial  femoral artery and/or  popliteal artery. Total occlusion noted in the peroneal artery.   Aorto-iliac images were somewhat limited. The right CIA was not seen at  all due to overlying bowel gas.  __________   [facility] 05/19/2018: Previously placed Ost LAD to Prox LAD drug-eluting stents are widely patent. Balloon angioplasty was performed. Mid LAD lesion is 30% stenosed. Ost 1st Diag lesion is 70% stenosed. Ost Cx to Prox Cx lesion is 30% stenosed. Dist RCA lesion is 30% stenosed. Mid RCA lesion is 90% stenosed. A drug-eluting stent was successfully placed using a STENT SIERRA 3.25 X 18 MM. Post intervention, there is a 0% residual stenosis.   1. Successful angioplasty and drug-eluting stent placement to the mid right coronary artery. 2.  Vagal reaction during closure device deployment responded well to atropine , IV fluids and Zofran .     Recommendations: Continue dual antiplatelet therapy for at least one year.  Aggressive treatment of risk factors. I started the patient on rosuvastatin  given the possible side effect of atorvastatin . Right femoral artery angiography showed evidence of occluded SFA which seems to be chronic.  I will evaluate the patient for PAD as an outpatient. __________   Carotid artery ultrasound 05/17/2018: IMPRESSION: 1. Mild bilateral carotid bifurcation plaque resulting in less than 50% diameter stenosis. 2.  Antegrade bilateral vertebral arterial flow. __________   2D echo 05/16/2018: - Left ventricle: The cavity size was normal. There was mild    concentric hypertrophy. Systolic function was normal. The    estimated ejection fraction was in the range of 55% to 60%. Wall    motion was normal; there were no regional wall motion    abnormalities. Doppler parameters are consistent with abnormal    left ventricular relaxation (grade 1 diastolic dysfunction).  - Aortic valve: There was trivial regurgitation.  - Mitral valve: Calcified annulus. There was mild  regurgitation.  - Left atrium: The atrium was mildly dilated. __________   [facility] 05/16/2018: Mid RCA lesion is 90% stenosed. Dist RCA lesion is 20% stenosed. Ost Cx to Prox Cx lesion is 30% stenosed. Ost LAD to Prox LAD lesion is 95% stenosed. Post intervention, there is a 0% residual stenosis. A drug-eluting stent was successfully placed using a STENT SIERRA 3.25 X 15 MM. Mid LAD lesion is  30% stenosed. Ost 1st Diag lesion is 70% stenosed. A stent was successfully placed.   1. Severe two-vessel coronary artery disease.  The culprit is 95% proximal LAD stenosis.  There is also 90% mid RCA stenosis. 2.  Left ventricular angiography could not be performed due to difficulty advancing the pigtail catheter as a result of severe radial artery spasm. Obtain an echocardiogram.  3.  Successful angioplasty and drug-eluting stent placement to the proximal LAD.   Recommendations: Dual antiplatelet therapy for at least one year. Blood pressure control and aggressive treatment of risk factors. Staged RCA PCI on Monday.  I might consider the femoral artery approach given radial artery spasm.   EKG:  EKG is ordered today.  The EKG ordered today demonstrates ***  Recent Labs: 02/06/2024: ALT 17 08/04/2024: BUN 14; Creatinine, Ser 0.91; Hemoglobin 12.9; Magnesium 2.2; Platelets 215; Potassium 3.7; Sodium 141; TSH 1.430  Recent Lipid Panel    Component Value Date/Time   CHOL 134 06/09/2024 1121   TRIG 67 06/09/2024 1121   HDL 56 06/09/2024 1121   CHOLHDL 2.4 06/09/2024 1121   CHOLHDL 4.2 05/16/2018 0009   VLDL 9 05/16/2018 0009   LDLCALC 64 06/09/2024 1121   LDLDIRECT 90 02/06/2024 1122    PHYSICAL EXAM:    VS:  There were no vitals taken for this visit.  BMI: There is no height or weight on file to calculate BMI.  Physical Exam  Wt Readings from Last 3 Encounters:  08/04/24 181 lb 3.2 oz (82.2 kg)  06/02/24 175 lb 4 oz (79.5 kg)  05/09/24 176 lb 12.9 oz (80.2 kg)     ASSESSMENT &  PLAN:   CAD involving the native coronary arteries without angina:  HFpEF:  Mitral and tricuspid regurgitation/aortic insufficiency:  Sinus bradycardia:  HTN: Blood pressure  HLD: LDL 64 in 05/2024.  Lymphedema:  PAD:   {Are you ordering a CV Procedure (e.g. stress test, cath, DCCV, TEE, etc)?   Press F2        :[id number]}     Disposition: F/u with Dr. Darron or an APP in ***.   Medication Adjustments/Labs and Tests Ordered: Current medicines are reviewed at length with the patient today.  Concerns regarding medicines are outlined above. Medication changes, Labs and Tests ordered today are summarized above and listed in the Patient Instructions accessible in Encounters.   Signed, Bernardino Bring, PA-C 10/29/2024 3:49 PM     [facility] [place] [address] [place], [place] [address] [phone]    [1]  No outpatient medications have been marked as taking for the 11/05/24 encounter (Appointment) with Bring Bernardino HERO, PA-C.

## 2024-11-05 ENCOUNTER — Ambulatory Visit: Admitting: Physician Assistant

## 2024-11-06 ENCOUNTER — Ambulatory Visit: Payer: Self-pay

## 2024-11-06 ENCOUNTER — Telehealth: Payer: Self-pay

## 2024-11-06 NOTE — Telephone Encounter (Unsigned)
 Copied from CRM [id number]. Topic: Clinical - Pink Word Triage >> Nov 06, 2024  8:06 AM Antwanette L wrote: Pink Word triggered transfer to Nurse Triage. See Triage Message for details. >> Nov 06, 2024  9:51 AM Farrel B wrote: Pt is currently returning a call that she missed a few minutes ago  >> Nov 06, 2024  8:07 AM Antwanette L wrote: Reason for Triage: The patient reports nasal congestion and is coughing up clear phlegm. The patient requests to schedule an appointment. The patient can be reached at [phone]

## 2024-11-06 NOTE — Telephone Encounter (Signed)
 Please call to schedule. Pt reports Cough and Aches Due to lack of availability in clinic, pt advised to seek UC. Pt declines wanting to go to UC.  Routing to clinic to assist with pt's concerns Pt advised to increase fluid intake Pt will call back for any worsening symptoms

## 2024-11-06 NOTE — Telephone Encounter (Signed)
 Called patient to let her know there were no openings this afternoon, that she will need to go to urgent care. She was not happy, she stated that she called in early this morning to try to get in around 8:00.

## 2024-11-06 NOTE — Telephone Encounter (Signed)
" ° °  FYI Only or Action Required?: Action required by provider: request for appointment and clinical question for provider.  Patient was last seen in primary care on 06/02/2024 by Lemon Raisin, MD.  Called Nurse Triage reporting Cough.  Symptoms began a week ago.  Interventions attempted: Nothing.  Symptoms are: gradually worsening.  Triage Disposition: See HCP Within 4 Hours (Or PCP Triage)  Patient/caregiver understands and will follow disposition?: No, wishes to speak with PCP  Message from Antwanette L sent at 11/06/2024  8:07 AM EST  Reason for Triage: The patient reports nasal congestion and is coughing up clear phlegm. The patient requests to schedule an appointment. The patient can be reached at [phone]   Reason for Disposition  Wheezing is present  Answer Assessment - Initial Assessment Questions 1. ONSET: When did the cough begin?       X 9 days  2. SEVERITY: How bad is the cough today?       Worse  3. SPUTUM: Describe the color of your sputum (e.g., none, dry cough; clear, white, yellow, green)      clear  4. HEMOPTYSIS: Are you coughing up any blood? If Yes, ask: How much? (e.g., flecks, streaks, tablespoons, etc.)      Denies  5. DIFFICULTY BREATHING: Are you having difficulty breathing? If Yes, ask: How bad is it? (e.g., mild, moderate, severe)       Denies  6. FEVER: Do you have a fever? If Yes, ask: What is your temperature, how was it measured, and when did it start?      Denies  7. CARDIAC HISTORY: Do you have any history of heart disease? (e.g., heart attack, congestive heart failure)       Per pt's chart, pt has hx of HTN and CAD  8. LUNG HISTORY: Do you have any history of lung disease?  (e.g., pulmonary embolus, asthma, emphysema)      Per pt's chart, pt does not have lung hx  9. PE RISK FACTORS: Do you have a history of blood clots? (or: recent major surgery, recent prolonged travel, bedridden)      Per pt's chart,  pt does not have PE risk factors  10. OTHER SYMPTOMS: Pt reports wheezing, aches, and runny nose. Pt denies chest pain          Pt reports Cough and Aches Due to lack of availability in clinic, pt advised to seek UC. Pt declines wanting to go to UC.  Routing to clinic to assist with pt's concerns Pt advised to increase fluid intake Pt will call back for any worsening symptoms  Protocols used: Cough - Acute Productive-A-AH  "

## 2024-11-06 NOTE — Telephone Encounter (Signed)
 Attempted to call pt x1. VM left for pt. Will attempt to contact pt at a later time.   Message from Antwanette L sent at 11/06/2024  8:07 AM EST  Reason for Triage: The patient reports nasal congestion and is coughing up clear phlegm. The patient requests to schedule an appointment. The patient can be reached at [phone]

## 2024-11-06 NOTE — Telephone Encounter (Signed)
Please call pt to schedule appt.  KP

## 2024-11-09 ENCOUNTER — Ambulatory Visit: Payer: Self-pay

## 2024-11-09 ENCOUNTER — Ambulatory Visit
Admission: EM | Admit: 2024-11-09 | Discharge: 2024-11-09 | Disposition: A | Discharge status: Home / Self Care | Attending: Family Medicine | Admitting: Family Medicine

## 2024-11-09 DIAGNOSIS — J069 Acute upper respiratory infection, unspecified: Secondary | ICD-10-CM

## 2024-11-09 DIAGNOSIS — J22 Unspecified acute lower respiratory infection: Secondary | ICD-10-CM

## 2024-11-09 MED ORDER — PREDNISONE 10 MG (21) PO TBPK: ORAL_TABLET | Freq: Every day | ORAL | 0 refills | Status: DC

## 2024-11-09 MED ORDER — ALBUTEROL SULFATE HFA 108 (90 BASE) MCG/ACT IN AERS: 2.0000 | INHALATION_SPRAY | Freq: Every evening | RESPIRATORY_TRACT | 0 refills | Status: DC | PRN

## 2024-11-09 MED ORDER — DOXYCYCLINE HYCLATE 100 MG PO CAPS: 100.0000 mg | ORAL_CAPSULE | Freq: Two times a day (BID) | ORAL | 0 refills | Status: DC

## 2024-11-09 NOTE — ED Triage Notes (Signed)
 Sx x 1 week  Cough Chest congestion Wheezing

## 2024-11-09 NOTE — ED Provider Notes (Signed)
 " [facility]    CSN: [id number] Arrival date & time: 11/09/24  0903      History   Chief Complaint Chief Complaint  Patient presents with   Cough    HPI Kelsey Terry is a 63 y.o. female.   HPI  History obtained from the patient. Kelsey Terry presents for 1 week of cough, chest congestion with wheezing, sneezing, nasal congestion, rhinorrhea and sinus headache.  Symptoms get worse at night.  Denies fever, sore throat, vomiting and diarrhea. Her son has been sick with similar sx.  Symptoms are not getting better but her son is getting better.   Home COVID and influenza test was negative.   No history of asthma. Denies vaping and smoking.        Past Medical History:  Diagnosis Date   Allergies    Asthma    Blood dyscrasia    blood clot in right leg   CAD (coronary artery disease) 05/05/2019   CAD in native artery    a. NSTEMI 05/16/18: LHC 05/16/18 - ost-pLAD 95% s/p PCI/DES, mLAD 30%, ostD1 70%, ost-pLCx 30%, mRCA 90%, dRCA 20%; b. staged PCI/DES to [facility] 7/1   Complication of anesthesia    patient stated she had some kind of reaction to anesthesia, thought she was having a stroke during her second cath procedure.   Diabetes mellitus without complication (HCC)    pre-diabetes   Diastolic dysfunction    a. TTE 05/16/18: EF 55-60%, no RWMA, Gr1DD, trivial AI, calcified mitral annulus, mild MR, mildly dilated LA   Dyspnea    with activity   Hypertension    Lymphedema    LLE   NSTEMI (non-ST elevated myocardial infarction) (HCC) 05/16/2018   Pericarditis    a. ~ 2012 in [place], [place] s/p pericardiocentesis    Wears contact lenses     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   OA (osteoarthritis) of knee 05/09/2024   Hyperlipidemia 03/06/2021   Familial hypercholesterolemia 08/23/2020   Chronic venous insufficiency 08/26/2019   Polyp of sigmoid colon    Polyp of ascending colon    Lymphedema of left leg  06/16/2019   Prediabetes 05/20/2019   Essential hypertension 05/05/2019   CAD (coronary artery disease) 05/05/2019    Past Surgical History:  Procedure Laterality Date   ABDOMINAL HYSTERECTOMY     CESAREAN SECTION     COLONOSCOPY WITH PROPOFOL  N/A 08/13/2019   Procedure: COLONOSCOPY WITH BIOPSIES;  Surgeon: Jinny Carmine, MD;  Location: [facility];  Service: Endoscopy;  Laterality: N/A;   CORONARY STENT INTERVENTION N/A 05/16/2018   Procedure: CORONARY STENT INTERVENTION;  Surgeon: Darron Deatrice LABOR, MD;  Location: [facility];  Service: Cardiovascular;  Laterality: N/A;   CORONARY STENT INTERVENTION N/A 05/19/2018   Procedure: CORONARY STENT INTERVENTION;  Surgeon: Darron Deatrice LABOR, MD;  Location: [facility];  Service: Cardiovascular;  Laterality: N/A;   LEFT HEART CATH AND CORONARY ANGIOGRAPHY N/A 05/16/2018   Procedure: LEFT HEART CATH AND CORONARY ANGIOGRAPHY;  Surgeon: Darron Deatrice LABOR, MD;  Location: [facility];  Service: Cardiovascular;  Laterality: N/A;   PERICARDIOCENTESIS     POLYPECTOMY N/A 08/13/2019   Procedure: POLYPECTOMY;  Surgeon: Jinny Carmine, MD;  Location: [facility];  Service: Endoscopy;  Laterality: N/A;    OB History   No obstetric history on file.      Home Medications    Prior to Admission medications  Medication Sig Start Date End Date Taking? Authorizing Provider  albuterol  (VENTOLIN  HFA) 108 (90 Base) MCG/ACT inhaler Inhale 2 puffs into the lungs at bedtime as needed. 11/09/24  Yes Maeva Dant, DO  Alcohol Swabs (B-D SINGLE USE SWABS REGULAR) PADS 1 Units by Does not apply route 3 (three) times daily. 06/19/24  Yes Lemon Raisin, MD  aspirin  EC (ASPIRIN  LOW DOSE) 81 MG tablet TAKE 1 TABLET DAILY 10/14/23  Yes Dunn, Bernardino HERO, PA-C  Blood Glucose Monitoring Suppl DEVI 1 each by Does not apply route in the morning, at noon, and at bedtime. May substitute to any manufacturer covered by patient's insurance. 06/19/24   Yes Lemon Raisin, MD  CALCIUM  CITRATE-VITAMIN D PO Take 1 tablet by mouth 2 (two) times daily.   Yes [provider]  chlorthalidone  (HYGROTON ) 25 MG tablet Take 0.5 tablets (12.5 mg total) by mouth daily. 09/18/24  Yes Lemon Raisin, MD  doxycycline  (VIBRAMYCIN ) 100 MG capsule Take 1 capsule (100 mg total) by mouth 2 (two) times daily. 11/09/24  Yes Cristi Gwynn, DO  ezetimibe  (ZETIA ) 10 MG tablet Take 1 tablet (10 mg total) by mouth daily. 09/18/24 12/17/24 Yes Lemon Raisin, MD  Glucose Blood (BLOOD GLUCOSE TEST STRIPS) STRP TEST BLOOD SUGAR IN THE MORNING, AT [facility] AND AT BEDTIME 07/28/24  Yes Lemon Raisin, MD  losartan  (COZAAR ) 100 MG tablet Take 1 tablet (100 mg total) by mouth daily. 09/18/24  Yes Lemon Raisin, MD  LUMIGAN 0.01 % SOLN 1 drop at bedtime. 08/23/23  Yes [provider]  Multiple Vitamin (MULTIVITAMIN) tablet Take 1 tablet by mouth daily.   Yes [provider]  potassium chloride  (KLOR-CON ) 10 MEQ tablet Take 2 tablets (20 mEq total) by mouth daily. 09/18/24  Yes Lemon Raisin, MD  predniSONE  (STERAPRED UNI-PAK 21 TAB) 10 MG (21) TBPK tablet Take by mouth daily. Take 6 tabs by mouth daily for 1, then 5 tabs for 1 day, then 4 tabs for 1 day, then 3 tabs for 1 day, then 2 tabs for 1 day, then 1 tab for 1 day. 11/09/24  Yes Roslyn Else, DO  rosuvastatin  (CRESTOR ) 40 MG tablet Take 1 tablet (40 mg total) by mouth daily. 09/18/24  Yes Lemon Raisin, MD  TRUEplus Lancets 33G MISC TEST BLOOD SUGAR IN THE MORNING, AT NOON, AND AT BEDTIME 07/27/24  Yes Lemon Raisin, MD  metFORMIN  (GLUCOPHAGE ) 500 MG tablet Take 1 tablet (500 mg total) by mouth daily. 12/19/23 08/04/24  Joshua Cathryne BROCKS, MD    Family History Family History  Problem Relation Age of Onset   Hypertension Mother    Pancreatic cancer Father    Diabetes Brother    Breast cancer Neg Hx     Social History Social History[1]   Allergies   Atrovent  nasal spray [ipratropium],  Dorzolamide, Latanoprost, Penicillins, Timolol, Amoxicillin, and Erythromycin   Review of Systems Review of Systems: negative unless otherwise stated in HPI.      Physical Exam Triage Vital Signs ED Triage Vitals  Encounter Vitals Group     BP 11/09/24 1029 124/67     Girls Systolic BP Percentile --      Girls Diastolic BP Percentile --      Boys Systolic BP Percentile --      Boys Diastolic BP Percentile --      Pulse Rate 11/09/24 1029 (!) 59     Resp 11/09/24 1029 18     Temp 11/09/24 1029 98.7 F (37.1 C)     Temp  Source 11/09/24 1029 Oral     SpO2 11/09/24 1029 95 %     Weight 11/09/24 1028 175 lb (79.4 kg)     Height --      Head Circumference --      Peak Flow --      Pain Score 11/09/24 1028 0     Pain Loc --      Pain Education --      Exclude from Growth Chart --    No data found.  Updated Vital Signs BP 124/67 (BP Location: Right Arm)   Pulse (!) 59   Temp 98.7 F (37.1 C) (Oral)   Resp 18   Wt 79.4 kg   SpO2 95%   BMI 31.00 kg/m   Visual Acuity Right Eye Distance:   Left Eye Distance:   Bilateral Distance:    Right Eye Near:   Left Eye Near:    Bilateral Near:     Physical Exam GEN:     alert, non-toxic appearing female in no distress    HENT:  mucus membranes moist,  moderate erythematous edematous turbinates, clear nasal discharge EYES:   no scleral injection or discharge  RESP:  no increased work of breathing, clear to auscultation bilaterally CVS:   regular rate and rhythm Skin:   warm and dry, no rash on visible skin    UC Treatments / Results  Labs (all labs ordered are listed, but only abnormal results are displayed) Labs Reviewed - No data to display  EKG   Radiology No results found.   Procedures Procedures (including critical care time)  Medications Ordered in UC Medications - No data to display  Initial Impression / Assessment and Plan / UC Course  I have reviewed the triage vital signs and the nursing  notes.  Pertinent labs & imaging results that were available during my care of the patient were reviewed by me and considered in my medical decision making (see chart for details).       Pt is a 63 y.o. female who presents for 1 week of cough that is not improving.  Kelsey Terry is  afebrile here without recent antipyretics. Satting 95-98% on room air. Overall pt is  non-toxic appearing, well hydrated, without respiratory distress. Pulmonary exam is unremarkable except for cough.  After shared decision making, we will not pursue chest x-ray at this time.  COVID  and influenza testing deferred due to length of symptoms.   Treat acute bronchitis with steroids and antibiotics as below.  Albuterol  at bedtime to help with bronchospasm. Typical duration of symptoms discussed. Return and ED precautions given and patient voiced understanding.   Discussed MDM, treatment plan and plan for follow-up with patient who agrees with plan.      Final Clinical Impressions(s) / UC Diagnoses   Final diagnoses:  Lower respiratory tract infection  URI with cough and congestion     Discharge Instructions      Stop by the pharmacy to pick up your prescriptions.  Follow up with your primary care provider or return to the urgent care, if not improving.       ED Prescriptions     Medication Sig Dispense Auth. Provider   doxycycline  (VIBRAMYCIN ) 100 MG capsule Take 1 capsule (100 mg total) by mouth 2 (two) times daily. 14 capsule Bellarae Lizer, DO   predniSONE  (STERAPRED UNI-PAK 21 TAB) 10 MG (21) TBPK tablet Take by mouth daily. Take 6 tabs by mouth daily for 1, then 5 tabs  for 1 day, then 4 tabs for 1 day, then 3 tabs for 1 day, then 2 tabs for 1 day, then 1 tab for 1 day. 21 tablet Marice Angelino, DO   albuterol  (VENTOLIN  HFA) 108 (90 Base) MCG/ACT inhaler Inhale 2 puffs into the lungs at bedtime as needed. 6.7 g Tanayia Wahlquist, DO      PDMP not reviewed this encounter.     [1]  Social  History Tobacco Use   Smoking status: Never   Smokeless tobacco: Never  Vaping Use   Vaping status: Never Used  Substance Use Topics   Alcohol use: Never   Drug use: Never     Taha Dimond, DO 11/09/24 1056  "

## 2024-11-09 NOTE — Telephone Encounter (Signed)
 Patient seen at urgent care on 11/09/2024.

## 2024-11-09 NOTE — Telephone Encounter (Signed)
 FYI Only or Action Required?: Action required by provider: update on patient condition.  Patient was last seen in primary care on 06/02/2024 by Lemon Raisin, MD.  Called Nurse Triage reporting Cough.  Symptoms began a week ago.  Interventions attempted: OTC medications: otc did not work.  Triage Disposition: See HCP Within 4 Hours (Or PCP Triage)  Patient/caregiver understands and will follow disposition?: Unsure                    Copied from CRM #[id number]. Topic: Clinical - Red Word Triage >> Nov 09, 2024  8:00 AM Willma R wrote: [facility] that prompted transfer to Nurse Triage: Patient states she has a productive cough which thick yellow mucous, runny nose, sneezing, and wheezing with rumble in her chest. Reason for Disposition  Wheezing is present  Answer Assessment - Initial Assessment Questions This RN recommends pt is seen within 4 hours based off below symptoms. This RN recommends pt goes to urgent care as no appointment availability in office at PCP office or preferred group region offices. Pt states the urgent care has a high copay. This RN is unsure if pt will go to be seen today. This RN educated pt that she needs to be seen. This RN will send a high priority message to PCP for review.   This RN confirmed pt call back number is [phone].  Productive cough, wheezing  Onset:last Mon Can hear it in chest  Constant especially at night, I didn't get any sleep at all Sneezing, fever blister on lip Chest feels heavy after coughing Denies fever  Protocols used: Cough - Acute Productive-A-AH

## 2024-11-09 NOTE — Discharge Instructions (Signed)
 Stop by the pharmacy to pick up your prescriptions.  Follow up with your primary care provider or return to the urgent care, if not improving.

## 2024-11-09 NOTE — Telephone Encounter (Signed)
 Please review. Would you be okay with doing virtual or have her come in?

## 2024-12-02 ENCOUNTER — Telehealth: Payer: Self-pay

## 2024-12-02 ENCOUNTER — Other Ambulatory Visit: Payer: Self-pay

## 2024-12-02 DIAGNOSIS — Z860100 Personal history of colon polyps, unspecified: Secondary | ICD-10-CM

## 2024-12-02 MED ORDER — NA SULFATE-K SULFATE-MG SULF 17.5-3.13-1.6 GM/177ML PO SOLN: 1.0000 | Freq: Once | ORAL | 0 refills | Status: AC

## 2024-12-02 NOTE — Telephone Encounter (Signed)
"  ° °  Pre-operative Risk Assessment    Patient Name: Kelsey Terry  DOB: 08/04/1961 MRN: [id number]   Date of last office visit: 08/04/2024 D. Abigail, PA-C Date of next office visit: 12/08/2024 D.  Abigail, PA-C   Request for Surgical Clearance    Procedure:  Colonscopy  Date of Surgery:  Clearance 01/22/25                                 Surgeon:  Dr. Clotilda Schaffer Surgeon's Group or Practice Name:  [facility] Phone number:  [phone] Fax number:  [phone]   Type of Clearance Requested:   - Medical    Type of Anesthesia:  MAC   Additional requests/questions:    SignedTed Muskrat   12/02/2024, 4:22 PM   "

## 2024-12-02 NOTE — Telephone Encounter (Signed)
 Per pt phone call pt would like to schedule a colonoscopy

## 2024-12-02 NOTE — Telephone Encounter (Signed)
" ° °  Name: Kelsey Terry  DOB: March 03, 1961  MRN: [id number]  Primary Cardiologist: Deatrice Cage, MD  Chart reviewed as part of pre-operative protocol coverage. She has an upcoming appointment with Bernardino Bring, PA and the pre-operative cardiac clearance can be addressed during this. The appointment notes have been updated to reflect this request.  Saddie GORMAN Cleaves, NP  12/02/2024, 4:33 PM   "

## 2024-12-02 NOTE — Progress Notes (Signed)
 Patient was advised due to her cardiac history recommended having procedure at [facility].  She said she preferred [facility] and would take the chance of having to reschedule at [facility] due to cardiac history.  Cardiac clearance will be sent to her cardiologist office.  Thanks,  Russell, CMA

## 2024-12-02 NOTE — Telephone Encounter (Signed)
 Gastroenterology Pre-Procedure Review   Request Date: 01/22/25 Requesting Physician: Dr. Melany   PATIENT REVIEW QUESTIONS: The patient responded to the following health history questions as indicated:     1. Are you having any GI issues? no 2. Do you have a personal history of Polyps? yes (last colonoscopy was 08/13/19 with Dr Jinny) 3. Do you have a family history of Colon Cancer or Polyps? no 4. Diabetes Mellitus? Prediabetes taking metformin  5. Joint replacements in the past 12 months?no 6. Major health problems in the past 3 months?no 7. Any artificial heart valves, MVP, or defibrillator? CAD and Heart Valve Insufficiency Clearance sent to heartcare    MEDICATIONS & ALLERGIES:    Patient reports the following regarding taking any anticoagulation/antiplatelet therapy:   Plavix, Coumadin, Eliquis, Xarelto, Lovenox , Pradaxa, Brilinta , or Effient? no Aspirin ? yes (81 mg)   Patient confirms/reports the following medications:        Current Outpatient Medications  Medication Sig Dispense Refill   aspirin  EC (ASPIRIN  LOW DOSE) 81 MG tablet TAKE 1 TABLET DAILY 90 tablet 3   CALCIUM  CITRATE-VITAMIN D PO Take 1 tablet by mouth 2 (two) times daily.       chlorthalidone  (HYGROTON ) 25 MG tablet TAKE 1/2 TABLET(12.5MG      TOTAL) DAILY. 45 tablet 1   ezetimibe  (ZETIA ) 10 MG tablet Take 1 tablet (10 mg total) by mouth daily. 90 tablet 3   losartan  (COZAAR ) 100 MG tablet Take 1 tablet (100 mg total) by mouth daily. 90 tablet 1   LUMIGAN 0.01 % SOLN 1 drop at bedtime.       metFORMIN  (GLUCOPHAGE ) 500 MG tablet Take 1 tablet (500 mg total) by mouth daily. 90 tablet 1   Multiple Vitamin (MULTIVITAMIN) tablet Take 1 tablet by mouth daily.       potassium chloride  (KLOR-CON ) 10 MEQ tablet Take 2 tablets (20 mEq total) by mouth daily. 180 tablet 1   rosuvastatin  (CRESTOR ) 40 MG tablet Take 1 tablet (40 mg total) by mouth daily. 90 tablet 1      No current facility-administered medications for this  visit.        Patient confirms/reports the following allergies:  Allergies       Allergies  Allergen Reactions   Atrovent  Nasal Spray [Ipratropium]        dizziness   Dorzolamide Other (See Comments)      Chest discomfort   Latanoprost Other (See Comments)      Chest discomfort   Timolol Other (See Comments)      Chest discomfort   Amoxicillin Itching and Other (See Comments)      Reaction: bump in vaginal region Has patient had a PCN reaction causing immediate rash, facial/tongue/throat swelling, SOB or lightheadedness with hypotension: No Has patient had a PCN reaction causing severe rash involving mucus membranes or skin necrosis: No Has patient had a PCN reaction that required hospitalization: No Has patient had a PCN reaction occurring within the last 10 years: No If all of the above answers are NO, then may proceed with Cephalosporin use.     Erythromycin Hives and Rash        No orders of the defined types were placed in this encounter.     AUTHORIZATION INFORMATION Primary Insurance: 1D#: Group #:   Secondary Insurance: 1D#: Group #:   SCHEDULE INFORMATION: Date: 08/20/2024 Time: Location:  [facility]

## 2024-12-03 ENCOUNTER — Other Ambulatory Visit: Payer: Self-pay | Admitting: Student

## 2024-12-03 ENCOUNTER — Ambulatory Visit: Admitting: Student

## 2024-12-03 ENCOUNTER — Encounter: Payer: Self-pay | Admitting: Student

## 2024-12-03 VITALS — BP 142/80 | HR 69 | Temp 98.3°F | Ht 63.0 in | Wt 182.5 lb

## 2024-12-03 DIAGNOSIS — I5032 Chronic diastolic (congestive) heart failure: Secondary | ICD-10-CM

## 2024-12-03 DIAGNOSIS — I10 Essential (primary) hypertension: Secondary | ICD-10-CM | POA: Diagnosis not present

## 2024-12-03 DIAGNOSIS — I2510 Atherosclerotic heart disease of native coronary artery without angina pectoris: Secondary | ICD-10-CM | POA: Diagnosis not present

## 2024-12-03 DIAGNOSIS — I5030 Unspecified diastolic (congestive) heart failure: Secondary | ICD-10-CM | POA: Insufficient documentation

## 2024-12-03 DIAGNOSIS — R7303 Prediabetes: Secondary | ICD-10-CM | POA: Diagnosis not present

## 2024-12-03 MED ORDER — CHLORTHALIDONE 12.5 MG PO TABS: 12.5000 mg | ORAL_TABLET | Freq: Once | ORAL | 1 refills | Status: DC

## 2024-12-03 MED ORDER — LOSARTAN POTASSIUM 100 MG PO TABS: 100.0000 mg | ORAL_TABLET | Freq: Every day | ORAL | 1 refills | Status: AC

## 2024-12-03 MED ORDER — ROSUVASTATIN CALCIUM 40 MG PO TABS: 40.0000 mg | ORAL_TABLET | Freq: Every day | ORAL | 1 refills | Status: AC

## 2024-12-03 MED ORDER — POTASSIUM CHLORIDE ER 10 MEQ PO TBCR: 20.0000 meq | EXTENDED_RELEASE_TABLET | Freq: Every day | ORAL | 1 refills | Status: AC

## 2024-12-03 MED ORDER — EZETIMIBE 10 MG PO TABS: 10.0000 mg | ORAL_TABLET | Freq: Every day | ORAL | 3 refills | Status: AC

## 2024-12-03 NOTE — Assessment & Plan Note (Addendum)
 Metformin  caused chest discomfort and has not taken for a year.  Repeat A1c at next visit

## 2024-12-03 NOTE — Progress Notes (Signed)
 "  Established Patient Office Visit  Subjective   Patient ID: Kelsey Terry, female    DOB: [birth year]  Age: 64 y.o. MRN: [id number]  Chief Complaint  Patient presents with   Hypertension    Kelsey Terry is a 64 y.o. person with medical hx listed below who presents today for hypertension follow up. Feeling well denies CP, SOB, nausea, vision changes, weakness, dizziness, or sensory change.   Patient Active Problem List   Diagnosis Date Noted   (HFpEF) heart failure with preserved ejection fraction (HCC) 12/03/2024   Heart valve insufficiency 06/02/2024   OA (osteoarthritis) of knee 05/09/2024   Hyperlipidemia 03/06/2021   Polyp of sigmoid colon    Polyp of ascending colon    Lymphedema of left leg 06/16/2019   Prediabetes 05/20/2019   Essential hypertension 05/05/2019   CAD (coronary artery disease) 05/05/2019      ROS Refer to HPI    Objective:     Outpatient Encounter Medications as of 12/03/2024  Medication Sig   Alcohol Swabs (B-D SINGLE USE SWABS REGULAR) PADS 1 Units by Does not apply route 3 (three) times daily.   aspirin  EC (ASPIRIN  LOW DOSE) 81 MG tablet TAKE 1 TABLET DAILY   Blood Glucose Monitoring Suppl DEVI 1 each by Does not apply route in the morning, at noon, and at bedtime. May substitute to any manufacturer covered by patient's insurance.   CALCIUM  CITRATE-VITAMIN D PO Take 1 tablet by mouth 2 (two) times daily.   Chlorthalidone  12.5 MG TABS Take 12.5 mg by mouth once.   Glucose Blood (BLOOD GLUCOSE TEST STRIPS) STRP TEST BLOOD SUGAR IN THE MORNING, AT NOON AND AT BEDTIME   LUMIGAN 0.01 % SOLN 1 drop at bedtime.   Multiple Vitamin (MULTIVITAMIN) tablet Take 1 tablet by mouth daily.   TRUEplus Lancets 33G MISC TEST BLOOD SUGAR IN THE MORNING, AT NOON, AND AT BEDTIME   [DISCONTINUED] albuterol  (VENTOLIN  HFA) 108 (90 Base) MCG/ACT inhaler Inhale 2 puffs into the lungs at bedtime as needed.   [DISCONTINUED] chlorthalidone  (HYGROTON ) 25 MG tablet Take 0.5  tablets (12.5 mg total) by mouth daily.   [DISCONTINUED] ezetimibe  (ZETIA ) 10 MG tablet Take 1 tablet (10 mg total) by mouth daily.   [DISCONTINUED] losartan  (COZAAR ) 100 MG tablet Take 1 tablet (100 mg total) by mouth daily.   [DISCONTINUED] potassium chloride  (KLOR-CON ) 10 MEQ tablet Take 2 tablets (20 mEq total) by mouth daily.   [DISCONTINUED] rosuvastatin  (CRESTOR ) 40 MG tablet Take 1 tablet (40 mg total) by mouth daily.   ezetimibe  (ZETIA ) 10 MG tablet Take 1 tablet (10 mg total) by mouth daily.   losartan  (COZAAR ) 100 MG tablet Take 1 tablet (100 mg total) by mouth daily.   potassium chloride  (KLOR-CON ) 10 MEQ tablet Take 2 tablets (20 mEq total) by mouth daily.   rosuvastatin  (CRESTOR ) 40 MG tablet Take 1 tablet (40 mg total) by mouth daily.   [DISCONTINUED] doxycycline  (VIBRAMYCIN ) 100 MG capsule Take 1 capsule (100 mg total) by mouth 2 (two) times daily. (Patient not taking: Reported on 12/03/2024)   [DISCONTINUED] metFORMIN  (GLUCOPHAGE ) 500 MG tablet Take 1 tablet (500 mg total) by mouth daily. (Patient not taking: Reported on 12/03/2024)   [DISCONTINUED] predniSONE  (STERAPRED UNI-PAK 21 TAB) 10 MG (21) TBPK tablet Take by mouth daily. Take 6 tabs by mouth daily for 1, then 5 tabs for 1 day, then 4 tabs for 1 day, then 3 tabs for 1 day, then 2 tabs for 1 day, then 1 tab for  1 day. (Patient not taking: Reported on 12/03/2024)   No facility-administered encounter medications on file as of 12/03/2024.    BP (!) 142/80   Pulse 69   Temp 98.3 F (36.8 C) (Oral)   Ht 5' 3 (1.6 m)   Wt 182 lb 8 oz (82.8 kg)   SpO2 97%   BMI 32.33 kg/m  BP Readings from Last 3 Encounters:  12/03/24 (!) 142/80  11/09/24 124/67  08/04/24 (!) 140/70    Physical Exam Constitutional:      Appearance: Normal appearance.  HENT:     Mouth/Throat:     Mouth: Mucous membranes are moist.     Pharynx: Oropharynx is clear.  Eyes:     Extraocular Movements: Extraocular movements intact.     Pupils: Pupils  are equal, round, and reactive to light.  Cardiovascular:     Rate and Rhythm: Normal rate and regular rhythm.     Pulses: Normal pulses.     Heart sounds: No murmur heard. Pulmonary:     Effort: Pulmonary effort is normal.     Breath sounds: Normal breath sounds. No rhonchi or rales.  Abdominal:     General: Abdomen is flat. Bowel sounds are normal. There is no distension.     Palpations: Abdomen is soft.     Tenderness: There is no abdominal tenderness.  Musculoskeletal:     Comments: BLE edema  Skin:    General: Skin is warm and dry.     Capillary Refill: Capillary refill takes less than 2 seconds.  Neurological:     General: No focal deficit present.     Mental Status: She is alert and oriented to person, place, and time.  Psychiatric:        Mood and Affect: Mood normal.        Behavior: Behavior normal.        06/02/2024   10:05 AM 12/19/2023   10:28 AM 09/10/2023   11:21 AM  Depression screen PHQ 2/9  Decreased Interest 0 0 0  Down, Depressed, Hopeless 0 0 0  PHQ - 2 Score 0 0 0  Altered sleeping 0 0 0  Tired, decreased energy 0 0 0  Change in appetite 0 0 0  Feeling bad or failure about yourself  0 0 0  Trouble concentrating 0 0 0  Moving slowly or fidgety/restless 0 0 0  Suicidal thoughts 0 0 0  PHQ-9 Score 0  0  0   Difficult doing work/chores Not difficult at all Not difficult at all Not difficult at all     Data saved with a previous flowsheet row definition       06/02/2024   10:05 AM 12/19/2023   10:28 AM 09/10/2023   11:21 AM 05/09/2023    9:10 AM  GAD 7 : Generalized Anxiety Score  Nervous, Anxious, on Edge 0 0 0 0  Control/stop worrying 0 0 0 0  Worry too much - different things 0 0 0 0  Trouble relaxing 0 0 0 0  Restless 0 0 0 0  Easily annoyed or irritable 0 0 0 0  Afraid - awful might happen 0 0 0 0  Total GAD 7 Score 0 0 0 0  Anxiety Difficulty Not difficult at all Not difficult at all Not difficult at all Not difficult at all    No  results found for any visits on 12/03/24.    The ASCVD Risk score (Arnett DK, et al., 2019) failed to calculate  for the following reasons:   Risk score cannot be calculated because patient has a medical history suggesting prior/existing ASCVD   * - Cholesterol units were assumed    Assessment & Plan:  Essential hypertension Assessment & Plan: BP today 140/80, Home BP typically 120/70 per patient.  Did not have a chance to take her BP medications today. Taking losartan  100 mg daily and chlorthalidone  12.5 mg daily. Suspect elevated due to missing her medications today. Will have her bring BP monitor to next appointment. If persistently elevated can increase chlorthalidone    Orders: -     Potassium Chloride  ER; Take 2 tablets (20 mEq total) by mouth daily.  Dispense: 180 tablet; Refill: 1 -     Losartan  Potassium; Take 1 tablet (100 mg total) by mouth daily.  Dispense: 90 tablet; Refill: 1  Coronary artery disease involving native heart without angina pectoris, unspecified vessel or lesion type Assessment & Plan: No anginal symptoms today. Continue rosuvastatin , zetia , and ASA 81 mg  Orders: -     Rosuvastatin  Calcium ; Take 1 tablet (40 mg total) by mouth daily.  Dispense: 90 tablet; Refill: 1  Prediabetes Assessment & Plan: Metformin  caused chest discomfort and has not taken for a year.  Repeat A1c at next visit   Chronic heart failure with preserved ejection fraction (HFpEF) (HCC) Assessment & Plan: Euvolemic on thiazide    Other orders -     Ezetimibe ; Take 1 tablet (10 mg total) by mouth daily.  Dispense: 90 tablet; Refill: 3 -     Chlorthalidone ; Take 12.5 mg by mouth once.  Dispense: 90 tablet; Refill: 1    Return in about 6 months (around 06/02/2025) for physical.    Harlene Saddler, MD "

## 2024-12-03 NOTE — Assessment & Plan Note (Addendum)
 BP today 140/80, Home BP typically 120/70 per patient.  Did not have a chance to take her BP medications today. Taking losartan  100 mg daily and chlorthalidone  12.5 mg daily. Suspect elevated due to missing her medications today. Will have her bring BP monitor to next appointment. If persistently elevated can increase chlorthalidone 

## 2024-12-03 NOTE — Assessment & Plan Note (Signed)
 Euvolemic on thiazide

## 2024-12-03 NOTE — Assessment & Plan Note (Addendum)
 No anginal symptoms today. Continue rosuvastatin , zetia , and ASA 81 mg

## 2024-12-04 NOTE — Telephone Encounter (Signed)
 Requested medications are due for refill today.  See note  Requested medications are on the active medications list.  yes  Last refill. 12/03/2024  Future visit scheduled.   yes  Notes to clinic.  Pharmacy comment: The frequency is missing on the original prescription for Hemiclor  12.5mg  tablet. Please respond with appropriate frequency or comment to Pharmacy. The frequency is missing on the original prescription for Hemiclor  12.5mg  tablet. Please respond with appropriate frequency or comment to Pharmacy.    Requested Prescriptions  Pending Prescriptions Disp Refills   HEMICLOR  12.5 MG TABS [Pharmacy Med Name: CHLORTHALIDONE  12.5 MG TABS] 90 tablet 1    Sig: TAKE 12.5 MG BY MOUTH ONCE.     There is no refill protocol information for this order

## 2024-12-04 NOTE — Telephone Encounter (Signed)
 Please review and resend. Thank you.   JM

## 2024-12-06 NOTE — Progress Notes (Unsigned)
 "  Cardiology Office Note    Date:  12/08/2024   ID:  Azusena Erlandson, DOB [birth year], MRN [id number]  PCP:  Lemon Raisin, MD  Cardiologist:  Deatrice Cage, MD  Electrophysiologist:  None   Chief Complaint: Preprocedure cardiac risk stratification   History of Present Illness:   Kelsey Terry is a 64 y.o. female with history of CAD, PAD, pericarditis status post pericardiocentesis in 2012 in [place], [place] , HTN, HLD, and lymphedema who presents for preprocedure cardiac risk stratification for colonoscopy scheduled for 01/22/2025.  She was previously followed by cardiology in [place]. She reported being admitted to [facility] in [place], [place] previously with substernal chest pain with workup revealing pericarditis. She underwent successful pericardiocentesis. She relocated to [place] to be closer to her daughter.  She was admitted with an NSTEMI in 04/2018.  LHC showed severe two-vessel CAD involving the proximal LAD and mid RCA.  She underwent successful PCI/DES to both vessels.  Echo at that time showed normal LV systolic function.  She has not required ischemic evaluation since.  She was noted to have peripheral arterial disease.  ABI was moderately reduced bilaterally with evidence of bilateral SFA occlusion.  She has been managed medically in the context of no claudication.  Outpatient cardiac monitoring in 2019 showed a predominant rhythm of sinus with 1 run of SVT lasting 30 seconds and a 5 beat run of NSVT with occasional PVCs.    She was seen in the office in 01/2024 and reported an episode of sharp, substernal chest pain that occurred several days prior that was short-lived and followed by an episode of deeper substernal chest pain that radiated towards the left shoulder and persisted throughout the day.  Echo in 02/2024 showed an EF of 60 to 65%, no regional wall motion abnormalities, mild LVH, normal LV diastolic function parameters, normal RV systolic function and ventricular  cavity size, mildly elevated RVSP estimated at 43.2 mmHg, mildly to moderately dilated left atrium, mild to moderate mitral regurgitation, moderate tricuspid regurgitation, mild aortic insufficiency, and aortic valve sclerosis without evidence of stenosis.  Lexiscan  MPI in 06/2024 was overall low risk with an EF greater than 65%.  She was most recently seen in the office in 07/2024 and was doing well from a cardiac perspective.  She remained bradycardic with heart rate in the low 50s bpm and was asymptomatic.  She comes in today for preprocedure cardiac risk stratification for colonoscopy scheduled on 01/22/2025.  She is doing well from a cardiac perspective and is without symptoms of angina or cardiac decompensation.  No dizziness, presyncope, or syncope.  No falls or symptoms concerning for bleeding.  Blood pressure is typically reasonably controlled at home.  Lower extremity swelling remains stable, wearing compression socks.  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  Overall feels well and does not have any acute cardiac concerns at this time.  DASI: > 4 METs RCRI: Low risk for noncardiac procedure   Labs independently reviewed: 07/2024 - magnesium 2.2, TSH normal, BUN 14, serum creatinine 0.91, potassium 3.7, Hgb 12.9, PLT 215 05/2024 - TC 134, TG 67, HDL 56, LDL 64, A1c 6.0 01/2024 - albumin 4.4, AST/ALT normal   Past Medical History:  Diagnosis Date   Allergies    Asthma    Blood dyscrasia    blood clot in right  leg   CAD (coronary artery disease) 05/05/2019   CAD in native artery    a. NSTEMI 05/16/18: LHC 05/16/18 - ost-pLAD 95% s/p PCI/DES, mLAD 30%, ostD1 70%, ost-pLCx 30%, mRCA 90%, dRCA 20%; b. staged PCI/DES to [facility] 7/1   Complication of anesthesia    patient stated she had some kind of reaction to anesthesia, thought  she was having a stroke during her second cath procedure.   Diabetes mellitus without complication (HCC)    pre-diabetes   Diastolic dysfunction    a. TTE 05/16/18: EF 55-60%, no RWMA, Gr1DD, trivial AI, calcified mitral annulus, mild MR, mildly dilated LA   Dyspnea    with activity   Hypertension    Lymphedema    LLE   NSTEMI (non-ST elevated myocardial infarction) (HCC) 05/16/2018   Pericarditis    a. ~ 2012 in [place], [place] s/p pericardiocentesis    Wears contact lenses     Past Surgical History:  Procedure Laterality Date   ABDOMINAL HYSTERECTOMY     CESAREAN SECTION     COLONOSCOPY WITH PROPOFOL  N/A 08/13/2019   Procedure: COLONOSCOPY WITH BIOPSIES;  Surgeon: Jinny Carmine, MD;  Location: [facility];  Service: Endoscopy;  Laterality: N/A;   CORONARY STENT INTERVENTION N/A 05/16/2018   Procedure: CORONARY STENT INTERVENTION;  Surgeon: Darron Deatrice LABOR, MD;  Location: [facility];  Service: Cardiovascular;  Laterality: N/A;   CORONARY STENT INTERVENTION N/A 05/19/2018   Procedure: CORONARY STENT INTERVENTION;  Surgeon: Darron Deatrice LABOR, MD;  Location: [facility] CV [facility];  Service: Cardiovascular;  Laterality: N/A;   LEFT HEART CATH AND CORONARY ANGIOGRAPHY N/A 05/16/2018   Procedure: LEFT HEART CATH AND CORONARY ANGIOGRAPHY;  Surgeon: Darron Deatrice LABOR, MD;  Location: [facility] INVAS[facility] CV LAB;  Service: Cardiovascular;  Laterality: N/A;   PERICARDIOCENTESIS     POLYPECTOMY N/A 08/13/2019   Procedure: POLYPECTOMY;  Surgeon: Jinny Carmine, MD;  Location: [facility];  Service: Endoscopy;  Laterality: N/A;    Current Medications: Active Medications[1]  Allergies:   Atrovent  nasal spray [ipratropium], Dorzolamide, Latanoprost, Penicillins, Timolol, Amoxicillin, and Erythromycin   Social History   Socioeconomic History   Marital status: Widowed    Spouse name: Not on file   Number of children: Not on file   Years of education: Not on file   Highest  education level: Not on file  Occupational History   Not on file  Tobacco Use   Smoking status: Never   Smokeless tobacco: Never  Vaping Use   Vaping status: Never Used  Substance and Sexual Activity   Alcohol use: Never   Drug use: Never   Sexual activity: Not Currently  Other Topics Concern   Not on file  Social History Narrative   Not on file   Social Drivers of Health   Tobacco Use: Low Risk (12/08/2024)   Patient History    Smoking Tobacco Use: Never    Smokeless Tobacco Use: Never    Passive Exposure: Not on file  Financial Resource Strain: Not on file  Food Insecurity: Food Insecurity Present (06/02/2024)   Epic    Worried About Programme Researcher, Broadcasting/film/video in the Last Year: Sometimes true    [employer] of Food in the Last Year: Sometimes true  Transportation Needs: No Transportation Needs (06/02/2024)   Epic    Lack of Transportation (Medical): No    Lack of Transportation (Non-Medical): No  Physical Activity: Not on file  Stress: Not on file  Social Connections: Not  on file  Depression (PHQ2-9): Low Risk (12/03/2024)   Depression (PHQ2-9)    PHQ-2 Score: 0  Alcohol Screen: Not on file  Housing: Unknown (06/02/2024)   Epic    Unable to Pay for Housing in the Last Year: No    Number of Times Moved in the Last Year: Not on file    Homeless in the Last Year: No  Utilities: Not At Risk (06/02/2024)   Epic    Threatened with loss of utilities: No  Health Literacy: Not on file     Family History:  The patient's family history includes Diabetes in her brother; Hypertension in her mother; Pancreatic cancer in her father. There is no history of Breast cancer.  ROS:   12-point review of systems is negative unless otherwise noted in the HPI.   EKGs/Labs/Other Studies Reviewed:    Studies reviewed were summarized above. The additional studies were reviewed today:  Lexiscan  MPI 07/10/2024:   Low risk, probably normal pharmacologic myocardial perfusion stress test.   There is a  small in size, mild in severity, reversible defect involving the apical and anterior lateral segments with normal wall motion most consistent with shifting breast attenuation artifact.  Small area of ischemia is felt less likely.   No significant scar is identified.   Left ventricular systolic function is normal (>65%) with normal wall motion. __________   2D echo 03/03/2024: 1. Left ventricular ejection fraction, by estimation, is 60 to 65%. The  left ventricle has normal function. The left ventricle has no regional  wall motion abnormalities. There is mild left ventricular hypertrophy.  Left ventricular diastolic parameters  were normal.   2. Right ventricular systolic function is normal. The right ventricular  size is normal. There is mildly elevated pulmonary artery systolic  pressure. The estimated right ventricular systolic pressure is 43.2 mmHg.   3. Left atrial size was mild to moderately dilated.   4. The mitral valve is normal in structure. Mild to moderate mitral valve  regurgitation. No evidence of mitral stenosis.   5. Tricuspid valve regurgitation is moderate.   6. The aortic valve is tricuspid. Aortic valve regurgitation is mild.  Aortic valve sclerosis is present, with no evidence of aortic valve  stenosis.   7. The inferior vena cava is normal in size with greater than 50%  respiratory variability, suggesting right atrial pressure of 3 mmHg.  __________   Outpatient cardiac monitor 06/2018: Normal sinus rhythm with no evidence of atrial fibrillation. 1 run of SVT lasting 30 seconds. 5 beat run of ventricular tachycardia. Occasional PVCs. __________   ABIs 08/05/2018: ABI/TBIToday's ABIToday's TBIPrevious ABIPrevious TBI  +-------+-----------+-----------+------------+------------+  Right  0.69       0.37                                 +-------+-----------+-----------+------------+------------+  Left   0.71       0.35                                  +-------+-----------+-----------+------------+------------+   Waveforms suggestive of bilateral inflow disease. See arterial duplex and  aorto-iliac images     Final Interpretation:  Right: Resting right ankle-brachial index indicates moderate right lower  extremity arterial disease. The right toe-brachial index is abnormal.   Left: Resting left ankle-brachial index indicates moderate left lower  extremity arterial disease. The  left toe-brachial index is abnormal.  __________   Lower extremity arterial ultrasound 08/05/2018: Final Interpretation:  Right: Total occlusion noted in the superficial femoral artery. Total  occlusion noted in the superficial femoral artery and/or popliteal artery.  Total occlusion noted in the peroneal artery.   Left: Total occlusion noted in the superficial femoral artery and/or  popliteal artery. Total occlusion noted in the peroneal artery.   Aorto-iliac images were somewhat limited. The right CIA was not seen at  all due to overlying bowel gas.  __________   [facility] 05/19/2018: Previously placed Ost LAD to Prox LAD drug-eluting stents are widely patent. Balloon angioplasty was performed. Mid LAD lesion is 30% stenosed. Ost 1st Diag lesion is 70% stenosed. Ost Cx to Prox Cx lesion is 30% stenosed. Dist RCA lesion is 30% stenosed. Mid RCA lesion is 90% stenosed. A drug-eluting stent was successfully placed using a STENT SIERRA 3.25 X 18 MM. Post intervention, there is a 0% residual stenosis.   1. Successful angioplasty and drug-eluting stent placement to the mid right coronary artery. 2.  Vagal reaction during closure device deployment responded well to atropine , IV fluids and Zofran .     Recommendations: Continue dual antiplatelet therapy for at least one year.  Aggressive treatment of risk factors. I started the patient on rosuvastatin  given the possible side effect of atorvastatin . Right femoral artery angiography showed evidence of occluded SFA  which seems to be chronic.  I will evaluate the patient for PAD as an outpatient. __________   Carotid artery ultrasound 05/17/2018: IMPRESSION: 1. Mild bilateral carotid bifurcation plaque resulting in less than 50% diameter stenosis. 2.  Antegrade bilateral vertebral arterial flow. __________   2D echo 05/16/2018: - Left ventricle: The cavity size was normal. There was mild    concentric hypertrophy. Systolic function was normal. The    estimated ejection fraction was in the range of 55% to 60%. Wall    motion was normal; there were no regional wall motion    abnormalities. Doppler parameters are consistent with abnormal    left ventricular relaxation (grade 1 diastolic dysfunction).  - Aortic valve: There was trivial regurgitation.  - Mitral valve: Calcified annulus. There was mild regurgitation.  - Left atrium: The atrium was mildly dilated. __________   LHC 05/16/2018: Mid RCA lesion is 90% stenosed. Dist RCA lesion is 20% stenosed. Ost Cx to Prox Cx lesion is 30% stenosed. Ost LAD to Prox LAD lesion is 95% stenosed. Post intervention, there is a 0% residual stenosis. A drug-eluting stent was successfully placed using a STENT SIERRA 3.25 X 15 MM. Mid LAD lesion is 30% stenosed. Ost 1st Diag lesion is 70% stenosed. A stent was successfully placed.   1. Severe two-vessel coronary artery disease.  The culprit is 95% proximal LAD stenosis.  There is also 90% mid RCA stenosis. 2.  Left ventricular angiography could not be performed due to difficulty advancing the pigtail catheter as a result of severe radial artery spasm. Obtain an echocardiogram.  3.  Successful angioplasty and drug-eluting stent placement to the proximal LAD.   Recommendations: Dual antiplatelet therapy for at least one year. Blood pressure control and aggressive treatment of risk factors. Staged RCA PCI on Monday.  I might consider the femoral artery approach given radial artery spasm.   EKG:  EKG is  ordered today.  The EKG ordered today demonstrates NSR with rare PAC, 63 bpm, no acute st/t changes  Recent Labs: 02/06/2024: ALT 17 08/04/2024: BUN 14; Creatinine, Ser 0.91; Hemoglobin  12.9; Magnesium 2.2; Platelets 215; Potassium 3.7; Sodium 141; TSH 1.430  Recent Lipid Panel    Component Value Date/Time   CHOL 134 06/09/2024 1121   TRIG 67 06/09/2024 1121   HDL 56 06/09/2024 1121   CHOLHDL 2.4 06/09/2024 1121   CHOLHDL 4.2 05/16/2018 0009   VLDL 9 05/16/2018 0009   LDLCALC 64 06/09/2024 1121   LDLDIRECT 90 02/06/2024 1122    PHYSICAL EXAM:    VS:  BP (!) 140/80 (BP Location: Left Arm, Patient Position: Sitting, Cuff Size: Normal)   Pulse 63 Comment: 69 oximeter  Ht 5' 3 (1.6 m)   Wt 184 lb 6.4 oz (83.6 kg)   SpO2 99%   BMI 32.66 kg/m   BMI: Body mass index is 32.66 kg/m.  Physical Exam Vitals reviewed.  Constitutional:      Appearance: She is well-developed.  HENT:     Head: Normocephalic and atraumatic.  Eyes:     General:        Right eye: No discharge.        Left eye: No discharge.  Cardiovascular:     Rate and Rhythm: Normal rate and regular rhythm.     Heart sounds: Normal heart sounds, S1 normal and S2 normal. Heart sounds not distant. No midsystolic click and no opening snap. No murmur heard.    No friction rub.  Pulmonary:     Effort: Pulmonary effort is normal. No respiratory distress.     Breath sounds: Normal breath sounds. No decreased breath sounds, wheezing, rhonchi or rales.  Musculoskeletal:     Cervical back: Normal range of motion.     Right lower leg: Edema present.     Left lower leg: Edema present.     Comments: Compression socks in place.  Skin:    General: Skin is warm and dry.     Nails: There is no clubbing.  Neurological:     Mental Status: She is alert and oriented to person, place, and time.  Psychiatric:        Speech: Speech normal.        Behavior: Behavior normal.        Thought Content: Thought content normal.         Judgment: Judgment normal.     Wt Readings from Last 3 Encounters:  12/08/24 184 lb 6.4 oz (83.6 kg)  12/03/24 182 lb 8 oz (82.8 kg)  11/09/24 175 lb (79.4 kg)     ASSESSMENT & PLAN:   Preprocedure cardiac risk stratification: She is scheduled to undergo colonoscopy on 01/22/2025: She is doing well and without symptoms concerning for angina or cardiac decompensation.  Recent Lexiscan  MPI in 06/2024 showed no evidence of ischemia and was low risk.  She is active at baseline and able to achieve greater than 4 METs without cardiac limitation.  Per RCRI, she is low risk for noncardiac procedure and may proceed without further cardiac testing.  Recommend aspirin  81 mg daily be continued throughout the entire periprocedural timeframe.  CAD involving native coronary arteries without angina: She is doing well and without symptoms concerning for angina.  As noted above, recent Lexiscan  MPI without evidence of infarction or ischemia and overall low risk.  Continue aggressive risk factor modification and secondary prevention including aspirin  81 mg, ezetimibe  10 mg, and rosuvastatin  40 mg.  No indication for further ischemic testing at this time.  HFpEF: Euvolemic and well compensated.  Remains on chlorthalidone  12.5 mg daily.    Mitral regurgitation/aortic insufficiency:  Recent echo showed mild to moderate mitral regurgitation, moderate tricuspid regurgitation, and mild aortic insufficiency.  Monitor periodically.  HTN: Blood pressure is mildly elevated in the office today though has been well-controlled at home.  Remains on chlorthalidone  12.5 mg and losartan  100 mg.  Will avoid calcium  channel blocker given underlying lymphedema and beta-blocker with prior bradycardia.  Continue to monitor.  If blood pressure is consistently elevated could titrate chlorthalidone  to 25 mg.  HLD: LDL 64 in 05/2024.  Remains on ezetimibe  10 mg and rosuvastatin  40 mg.  Lymphedema: Stable.  Continues with lymphedema pumps and  compression socks.  PAD: No symptoms concerning for claudication.  Remains on aspirin  81 mg, rosuvastatin  40 mg, and ezetimibe  10 mg.     Disposition: F/u with Dr. Darron or an APP in 6 months.   Medication Adjustments/Labs and Tests Ordered: Current medicines are reviewed at length with the patient today.  Concerns regarding medicines are outlined above. Medication changes, Labs and Tests ordered today are summarized above and listed in the Patient Instructions accessible in Encounters.   Bonney Bernardino Bring, PA-C 12/08/2024 11:06 AM     [facility] [place] [address] [place], [place] [address] [phone]     [1]  Current Meds  Medication Sig   Alcohol Swabs (B-D SINGLE USE SWABS REGULAR) PADS 1 Units by Does not apply route 3 (three) times daily.   aspirin  EC (ASPIRIN  LOW DOSE) 81 MG tablet TAKE 1 TABLET DAILY   Blood Glucose Monitoring Suppl DEVI 1 each by Does not apply route in the morning, at noon, and at bedtime. May substitute to any manufacturer covered by patient's insurance.   CALCIUM  CITRATE-VITAMIN D PO Take 1 tablet by mouth 2 (two) times daily.   Chlorthalidone  (HEMICLOR ) 12.5 MG TABS Take 12.5 mg by mouth daily.   ezetimibe  (ZETIA ) 10 MG tablet Take 1 tablet (10 mg total) by mouth daily.   Glucose Blood (BLOOD GLUCOSE TEST STRIPS) STRP TEST BLOOD SUGAR IN THE MORNING, AT NOON AND AT BEDTIME   losartan  (COZAAR ) 100 MG tablet Take 1 tablet (100 mg total) by mouth daily.   LUMIGAN 0.01 % SOLN 1 drop at bedtime.   Multiple Vitamin (MULTIVITAMIN) tablet Take 1 tablet by mouth daily.   potassium chloride  (KLOR-CON ) 10 MEQ tablet Take 2 tablets (20 mEq total) by mouth daily.   rosuvastatin  (CRESTOR ) 40 MG tablet Take 1 tablet (40 mg total) by mouth daily.   TRUEplus Lancets 33G MISC TEST BLOOD SUGAR IN THE MORNING, AT NOON, AND AT BEDTIME   "

## 2024-12-08 ENCOUNTER — Telehealth: Payer: Self-pay

## 2024-12-08 ENCOUNTER — Other Ambulatory Visit: Payer: Self-pay | Admitting: Student

## 2024-12-08 ENCOUNTER — Ambulatory Visit: Attending: Physician Assistant | Admitting: Physician Assistant

## 2024-12-08 ENCOUNTER — Encounter: Payer: Self-pay | Admitting: Physician Assistant

## 2024-12-08 VITALS — BP 140/80 | HR 63 | Ht 63.0 in | Wt 184.4 lb

## 2024-12-08 DIAGNOSIS — I340 Nonrheumatic mitral (valve) insufficiency: Secondary | ICD-10-CM

## 2024-12-08 DIAGNOSIS — E785 Hyperlipidemia, unspecified: Secondary | ICD-10-CM | POA: Diagnosis not present

## 2024-12-08 DIAGNOSIS — I2510 Atherosclerotic heart disease of native coronary artery without angina pectoris: Secondary | ICD-10-CM | POA: Diagnosis not present

## 2024-12-08 DIAGNOSIS — I25118 Atherosclerotic heart disease of native coronary artery with other forms of angina pectoris: Secondary | ICD-10-CM

## 2024-12-08 DIAGNOSIS — I739 Peripheral vascular disease, unspecified: Secondary | ICD-10-CM

## 2024-12-08 DIAGNOSIS — Z01810 Encounter for preprocedural cardiovascular examination: Secondary | ICD-10-CM | POA: Diagnosis not present

## 2024-12-08 DIAGNOSIS — I5032 Chronic diastolic (congestive) heart failure: Secondary | ICD-10-CM

## 2024-12-08 DIAGNOSIS — I351 Nonrheumatic aortic (valve) insufficiency: Secondary | ICD-10-CM | POA: Diagnosis not present

## 2024-12-08 DIAGNOSIS — I10 Essential (primary) hypertension: Secondary | ICD-10-CM | POA: Diagnosis not present

## 2024-12-08 DIAGNOSIS — I071 Rheumatic tricuspid insufficiency: Secondary | ICD-10-CM

## 2024-12-08 DIAGNOSIS — R001 Bradycardia, unspecified: Secondary | ICD-10-CM | POA: Diagnosis not present

## 2024-12-08 DIAGNOSIS — I890 Lymphedema, not elsewhere classified: Secondary | ICD-10-CM

## 2024-12-08 NOTE — Telephone Encounter (Signed)
"   Was sent to [facility] - [place], [place] - [facility] AT Portal to Registered [facility] Sites  Phone: [phone]     KP "

## 2024-12-08 NOTE — Patient Instructions (Signed)
 Medication Instructions:  Your physician recommends that you continue on your current medications as directed. Please refer to the Current Medication list given to you today.   *If you need a refill on your cardiac medications before your next appointment, please call your pharmacy*  Lab Work: None ordered at this time  If you have labs (blood work) drawn today and your tests are completely normal, you will receive your results only by: MyChart Message (if you have MyChart) OR A paper copy in the mail If you have any lab test that is abnormal or we need to change your treatment, we will call you to review the results.  Testing/Procedures: None ordered at this time   Follow-Up: At [facility], you and your health needs are our priority.  As part of our continuing mission to provide you with exceptional heart care, our providers are all part of one team.  This team includes your primary Cardiologist (physician) and Advanced Practice Providers or APPs (Physician Assistants and Nurse Practitioners) who all work together to provide you with the care you need, when you need it.  Your next appointment:   6 month(s)  Provider:   You may see Deatrice Cage, MD or Bernardino Bring, PA-C

## 2024-12-08 NOTE — Telephone Encounter (Signed)
 Copied from CRM [id number]. Topic: Clinical - Medication Refill >> Dec 08, 2024  3:56 PM Antony S wrote: Medication: Chlorthalidone  (HEMICLOR ) 12.5 MG TABS (said they need a 90 day supply not 30)  Has the patient contacted their pharmacy? Yes (Agent: If no, request that the patient contact the pharmacy for the refill. If patient does not wish to contact the pharmacy document the reason why and proceed with request.) (Agent: If yes, when and what did the pharmacy advise?)  This is the patient's preferred pharmacy:  [facility] - [place], [place] - [facility] AT Portal to Registered [facility] Sites [address] [place] [address] Phone: [phone] Fax: [phone]  Is this the correct pharmacy for this prescription? Yes If no, delete pharmacy and type the correct one.   Has the prescription been filled recently? No  Is the patient out of the medication? No  Has the patient been seen for an appointment in the last year OR does the patient have an upcoming appointment? Yes  Can we respond through MyChart? Yes  Agent: Please be advised that Rx refills may take up to 3 business days. We ask that you follow-up with your pharmacy.

## 2024-12-08 NOTE — Telephone Encounter (Signed)
 Called pt left VM to call back. Medication as sent in 12/04/24. Pt needs to call the pharmacy and see why they are not filling the medication.  KP

## 2024-12-08 NOTE — Telephone Encounter (Signed)
 Copied from CRM #[id number]. Topic: Clinical - Prescription Issue >> Dec 08, 2024  3:23 PM Tonda B wrote: Reason for CRM: patient is calling in said that she is unable to get her rx Chlorthalidone  (HEMICLOR ) 12.5 MG TABS please call pt back [phone]

## 2024-12-09 NOTE — Telephone Encounter (Signed)
 Called [facility] regarding patient's prescription status. Representative informed me that Rx was received on 12/04/24, was shipped out on 12/06/24 and expected to be delivered on 12/09/2024.   Called patient to give her an update on medication. Informed her that [facility] stated that expected delivery is 12/09/2024, if she has any problems getting medication to call our office back. Patient verbalized understanding.

## 2024-12-11 NOTE — Telephone Encounter (Signed)
 Per pre-op visit notes received from Dr. Abigail,   Preprocedure cardiac risk stratification: She is scheduled to undergo colonoscopy on 01/22/2025: She is doing well and without symptoms concerning for angina or cardiac decompensation.  Recent Lexiscan  MPI in 06/2024 showed no evidence of ischemia and was low risk.  She is active at baseline and able to achieve greater than 4 METs without cardiac limitation.  Per RCRI, she is low risk for noncardiac procedure and may proceed without further cardiac testing.  Recommend aspirin  81 mg daily be continued throughout the entire periprocedural timeframe.  Thanks, [place], CMA

## 2025-01-22 ENCOUNTER — Ambulatory Visit: Admit: 2025-01-22 | Admitting: Gastroenterology

## 2025-01-22 SURGERY — COLONOSCOPY
Anesthesia: General

## 2025-06-03 ENCOUNTER — Encounter: Admitting: Student
